# Patient Record
Sex: Female | Born: 1935 | Race: White | Hispanic: No | Smoking: Never smoker
Health system: Southern US, Community
[De-identification: ages and names within clinical notes are randomized; demographics above are authoritative.]

## PROBLEM LIST (undated history)

## (undated) DIAGNOSIS — G629 Polyneuropathy, unspecified: Secondary | ICD-10-CM

## (undated) DIAGNOSIS — R413 Other amnesia: Secondary | ICD-10-CM

## (undated) DIAGNOSIS — M5481 Occipital neuralgia: Secondary | ICD-10-CM

## (undated) DIAGNOSIS — R200 Anesthesia of skin: Secondary | ICD-10-CM

## (undated) DIAGNOSIS — E782 Mixed hyperlipidemia: Secondary | ICD-10-CM

## (undated) DIAGNOSIS — R1310 Dysphagia, unspecified: Secondary | ICD-10-CM

## (undated) DIAGNOSIS — C801 Malignant (primary) neoplasm, unspecified: Secondary | ICD-10-CM

## (undated) DIAGNOSIS — R278 Other lack of coordination: Secondary | ICD-10-CM

## (undated) DIAGNOSIS — N1831 Chronic kidney disease, stage 3a: Secondary | ICD-10-CM

## (undated) DIAGNOSIS — M1991 Primary osteoarthritis, unspecified site: Secondary | ICD-10-CM

## (undated) DIAGNOSIS — S42034A Nondisplaced fracture of lateral end of right clavicle, initial encounter for closed fracture: Secondary | ICD-10-CM

## (undated) DIAGNOSIS — I10 Essential (primary) hypertension: Secondary | ICD-10-CM

## (undated) DIAGNOSIS — G4730 Sleep apnea, unspecified: Secondary | ICD-10-CM

## (undated) DIAGNOSIS — M461 Sacroiliitis, not elsewhere classified: Secondary | ICD-10-CM

## (undated) DIAGNOSIS — I340 Nonrheumatic mitral (valve) insufficiency: Secondary | ICD-10-CM

## (undated) DIAGNOSIS — C441292 Squamous cell carcinoma of skin of left lower eyelid, including canthus: Secondary | ICD-10-CM

## (undated) DIAGNOSIS — M48061 Spinal stenosis, lumbar region without neurogenic claudication: Secondary | ICD-10-CM

## (undated) DIAGNOSIS — R7303 Prediabetes: Secondary | ICD-10-CM

## (undated) DIAGNOSIS — R519 Headache, unspecified: Secondary | ICD-10-CM

## (undated) DIAGNOSIS — R0602 Shortness of breath: Secondary | ICD-10-CM

## (undated) DIAGNOSIS — R569 Unspecified convulsions: Secondary | ICD-10-CM

## (undated) DIAGNOSIS — E079 Disorder of thyroid, unspecified: Secondary | ICD-10-CM

## (undated) DIAGNOSIS — I071 Rheumatic tricuspid insufficiency: Secondary | ICD-10-CM

## (undated) DIAGNOSIS — J309 Allergic rhinitis, unspecified: Secondary | ICD-10-CM

## (undated) DIAGNOSIS — M5126 Other intervertebral disc displacement, lumbar region: Secondary | ICD-10-CM

## (undated) HISTORY — PX: ABDOMINAL HYSTERECTOMY: SHX81

## (undated) HISTORY — PX: KNEE ARTHROSCOPY W/ LATERAL RETINACULAR REPAIR: SHX1875

## (undated) HISTORY — DX: Disorder of thyroid, unspecified: E07.9

## (undated) HISTORY — PX: BREAST BIOPSY: SHX20

## (undated) HISTORY — DX: Unspecified convulsions: R56.9

## (undated) HISTORY — PX: BREAST EXCISIONAL BIOPSY: SUR124

---
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
## 2011-05-14 DIAGNOSIS — M8580 Other specified disorders of bone density and structure, unspecified site: Secondary | ICD-10-CM | POA: Insufficient documentation

## 2011-06-24 DIAGNOSIS — E039 Hypothyroidism, unspecified: Secondary | ICD-10-CM | POA: Insufficient documentation

## 2012-01-06 ENCOUNTER — Ambulatory Visit: Payer: Self-pay | Admitting: Radiology

## 2012-01-06 LAB — CREATININE, SERUM: EGFR (African American): >60

## 2012-01-07 ENCOUNTER — Ambulatory Visit: Payer: Self-pay | Admitting: Internal Medicine

## 2012-02-13 ENCOUNTER — Ambulatory Visit: Payer: Self-pay | Admitting: Internal Medicine

## 2012-03-04 ENCOUNTER — Ambulatory Visit: Payer: Self-pay

## 2012-04-12 DIAGNOSIS — K589 Irritable bowel syndrome without diarrhea: Secondary | ICD-10-CM | POA: Insufficient documentation

## 2012-12-07 DIAGNOSIS — M47816 Spondylosis without myelopathy or radiculopathy, lumbar region: Secondary | ICD-10-CM | POA: Insufficient documentation

## 2012-12-08 ENCOUNTER — Ambulatory Visit: Payer: Self-pay | Admitting: Family Medicine

## 2013-02-12 ENCOUNTER — Emergency Department: Payer: Self-pay | Admitting: Emergency Medicine

## 2013-02-12 LAB — TROPONIN I: Troponin-I: <0.02 ng/mL

## 2013-02-12 LAB — URINALYSIS, COMPLETE
Glucose,UR: NEGATIVE mg/dL (ref 0–75)
Squamous Epithelial: 1

## 2013-02-12 LAB — COMPREHENSIVE METABOLIC PANEL
Anion Gap: 11 (ref 7–16)
Calcium, Total: 9.1 mg/dL (ref 8.5–10.1)
Chloride: 106 mmol/L (ref 98–107)
Co2: 21 mmol/L (ref 21–32)
Creatinine: 0.84 mg/dL (ref 0.60–1.30)
EGFR (African American): >60
EGFR (Non-African Amer.): >60
Glucose: 150 mg/dL — ABNORMAL HIGH (ref 65–99)
SGOT(AST): 28 U/L (ref 15–37)
SGPT (ALT): 28 U/L (ref 12–78)
Sodium: 138 mmol/L (ref 136–145)
Total Protein: 7.1 g/dL (ref 6.4–8.2)

## 2013-02-12 LAB — CK TOTAL AND CKMB (NOT AT ARMC)
CK, Total: 66 U/L (ref 21–215)
CK-MB: <0.5 ng/mL — ABNORMAL LOW (ref 0.5–3.6)

## 2013-02-12 LAB — CBC
HCT: 36.3 % (ref 35.0–47.0)
HGB: 12.5 g/dL (ref 12.0–16.0)
MCHC: 34.4 g/dL (ref 32.0–36.0)
Platelet: 158 10*3/uL (ref 150–440)
RDW: 13.3 % (ref 11.5–14.5)

## 2013-02-15 ENCOUNTER — Inpatient Hospital Stay: Payer: Self-pay | Admitting: Internal Medicine

## 2013-02-15 LAB — URINALYSIS, COMPLETE
Glucose,UR: NEGATIVE mg/dL (ref 0–75)
Ketone: NEGATIVE
Nitrite: NEGATIVE
Specific Gravity: 1.012 (ref 1.003–1.030)
Squamous Epithelial: <1

## 2013-02-15 LAB — CBC
HGB: 13.4 g/dL (ref 12.0–16.0)
MCH: 30.3 pg (ref 26.0–34.0)
MCHC: 35.2 g/dL (ref 32.0–36.0)
Platelet: 213 10*3/uL (ref 150–440)
RBC: 4.43 10*6/uL (ref 3.80–5.20)
RDW: 13.7 % (ref 11.5–14.5)
WBC: 6.4 10*3/uL (ref 3.6–11.0)

## 2013-02-15 LAB — TSH: Thyroid Stimulating Horm: 6.05 u[IU]/mL — ABNORMAL HIGH

## 2013-02-16 LAB — BASIC METABOLIC PANEL
BUN: 13 mg/dL (ref 7–18)
Chloride: 113 mmol/L — ABNORMAL HIGH (ref 98–107)
EGFR (African American): >60
EGFR (Non-African Amer.): 60 — ABNORMAL LOW
Glucose: 95 mg/dL (ref 65–99)
Potassium: 3.4 mmol/L — ABNORMAL LOW (ref 3.5–5.1)
Sodium: 146 mmol/L — ABNORMAL HIGH (ref 136–145)

## 2013-02-16 LAB — CBC WITH DIFFERENTIAL/PLATELET
Basophil #: 0 10*3/uL (ref 0.0–0.1)
Basophil %: 0.8 %
Eosinophil #: 0.2 10*3/uL (ref 0.0–0.7)
HGB: 11.1 g/dL — ABNORMAL LOW (ref 12.0–16.0)
Lymphocyte #: 1.5 10*3/uL (ref 1.0–3.6)
MCH: 30.5 pg (ref 26.0–34.0)
MCHC: 35.8 g/dL (ref 32.0–36.0)
Monocyte #: 0.4 x10 3/mm (ref 0.2–0.9)
Neutrophil #: 1.9 10*3/uL (ref 1.4–6.5)
Neutrophil %: 46.1 %
Platelet: 182 10*3/uL (ref 150–440)
RBC: 3.66 10*6/uL — ABNORMAL LOW (ref 3.80–5.20)
WBC: 4.1 10*3/uL (ref 3.6–11.0)

## 2013-02-16 LAB — COMPREHENSIVE METABOLIC PANEL
Albumin: 3.8 g/dL (ref 3.4–5.0)
BUN: 16 mg/dL (ref 7–18)
Bilirubin,Total: 0.9 mg/dL (ref 0.2–1.0)
Chloride: 108 mmol/L — ABNORMAL HIGH (ref 98–107)
EGFR (African American): 58 — ABNORMAL LOW
EGFR (Non-African Amer.): 50 — ABNORMAL LOW
Glucose: 114 mg/dL — ABNORMAL HIGH (ref 65–99)
Osmolality: 285 (ref 275–301)
Potassium: 3.4 mmol/L — ABNORMAL LOW (ref 3.5–5.1)
SGOT(AST): 25 U/L (ref 15–37)
SGPT (ALT): 34 U/L (ref 12–78)

## 2013-02-17 LAB — BASIC METABOLIC PANEL
BUN: 14 mg/dL (ref 7–18)
Calcium, Total: 8.9 mg/dL (ref 8.5–10.1)
Creatinine: 1.01 mg/dL (ref 0.60–1.30)
EGFR (Non-African Amer.): 53 — ABNORMAL LOW
Osmolality: 285 (ref 275–301)
Potassium: 4 mmol/L (ref 3.5–5.1)

## 2013-02-17 LAB — MAGNESIUM: Magnesium: 1.9 mg/dL

## 2013-02-18 LAB — CULTURE, BLOOD (SINGLE)

## 2013-02-20 LAB — CULTURE, BLOOD (SINGLE)

## 2013-04-15 DIAGNOSIS — IMO0002 Reserved for concepts with insufficient information to code with codable children: Secondary | ICD-10-CM | POA: Insufficient documentation

## 2013-04-15 DIAGNOSIS — R1310 Dysphagia, unspecified: Secondary | ICD-10-CM | POA: Insufficient documentation

## 2013-04-15 DIAGNOSIS — E041 Nontoxic single thyroid nodule: Secondary | ICD-10-CM | POA: Insufficient documentation

## 2013-08-03 ENCOUNTER — Ambulatory Visit: Payer: Self-pay | Admitting: Rheumatology

## 2013-09-06 DIAGNOSIS — M25551 Pain in right hip: Secondary | ICD-10-CM | POA: Insufficient documentation

## 2013-09-06 DIAGNOSIS — M25559 Pain in unspecified hip: Secondary | ICD-10-CM | POA: Insufficient documentation

## 2013-09-19 ENCOUNTER — Ambulatory Visit: Payer: Self-pay

## 2013-11-23 ENCOUNTER — Ambulatory Visit: Payer: Self-pay | Admitting: Family Medicine

## 2013-11-30 ENCOUNTER — Ambulatory Visit: Payer: Self-pay | Admitting: Family Medicine

## 2013-12-13 ENCOUNTER — Ambulatory Visit: Payer: Self-pay | Admitting: Family Medicine

## 2013-12-28 ENCOUNTER — Ambulatory Visit: Payer: Self-pay | Admitting: Family Medicine

## 2013-12-28 LAB — CREATININE, SERUM
Creatinine: 0.84 mg/dL (ref 0.60–1.30)
EGFR (Non-African Amer.): >60

## 2014-05-15 DIAGNOSIS — J309 Allergic rhinitis, unspecified: Secondary | ICD-10-CM | POA: Insufficient documentation

## 2014-10-23 DIAGNOSIS — E559 Vitamin D deficiency, unspecified: Secondary | ICD-10-CM | POA: Insufficient documentation

## 2014-12-19 ENCOUNTER — Ambulatory Visit: Payer: Self-pay | Admitting: Family Medicine

## 2015-01-12 NOTE — Discharge Summary (Signed)
PATIENT NAME:  Sarah [name], Sarah [name] MR#:  [id number] DATE OF BIRTH:  [birth year]  DATE OF ADMISSION:  02/15/2013 DATE OF DISCHARGE:  02/17/2013  PRIMARY CARE PHYSICIAN:  Dr. Hortencia Pilar.   DISCHARGE DIAGNOSES: 1.  Escherichia coli sepsis.  2.  Escherichia coli urinary tract infection.  3.  Hypothyroidism.  4.  Depression.  5.  Irritable bowel syndrome.   6.  Osteoarthritis.   DISCHARGE HOME MEDICATIONS:  1.  Diclofenac 75 mg by mouth twice daily.  2.  Synthroid 125 mcg by mouth daily.  3.  Sertraline 50 mg by mouth daily.  4.  Zyrtec 10 mg by mouth daily.  5.  Ocuvite multivitamin 1 tablet by mouth daily.  6.  Ipratropium nasal spray two sprays 3 times a day as needed.  7.  Probiotic capsule 1 capsule daily.  8.  Aspirin 81 mg by mouth daily.  9.  Gabapentin 100 mg by mouth at bedtime.  10.  Hyoscyamine 0.375 mg oral tablet twice daily.  11.  Ciprofloxacin 500 mg by mouth twice daily for 10 more days.   DISCHARGE DIET:  Low-sodium diet.   DISCHARGE ACTIVITY:  As tolerated.    FOLLOW-UP INSTRUCTIONS:  Follow up in 1 to 2 weeks with the PCP.   LABORATORY DATA AND IMAGING STUDIES:   1.  WBC 4.1, hemoglobin 11.1, hematocrit 31.1, platelet count 182.  Sodium 143, potassium 4.0, chloride 112, bicarbonate 25, BUN 14, creatinine 1.01, glucose 98 and calcium of 8.9, magnesium is 1.9.  2.  Blood cultures from May 27th are negative.  3.  Urinalysis negative, but blood cultures from May 25th are growing pansensitive Escherichia coli.  Urine cultures on 02/12/2013 growing pansensitive Escherichia coli as well.  Initial count on presentation to the ER was 9.2.   BRIEF HOSPITAL COURSE:  Sarah [name] is a pleasant 81 year old Caucasian female with past medical history significant for arthritis, irritable bowel syndrome, depression, hypothyroidism, presented to the ER for intractable nausea, vomiting and generalized weakness.  She was noticed to have a UTI and urine and blood cultures were sent and  patient was discharged home on Cipro and Doxycycline, however she had to come back because of intractable nausea, vomiting, unable to keep by mouth medications. 1.  Escherichia coli sepsis.  Blood cultures on initial ER visit were positive for Escherichia coli, likely source was urine because urine cultures was also growing the same, but inability to keep by mouth medications, intractable nausea, vomiting and generalized weakness.  She was admitted, started on IV Rocephin and white count improved and her nausea, vomiting improved so she is being discharged on by mouth ciprofloxacin for 10 more days to finish a two-week course of antibiotics.   2.  All her other home medications were continued without any changes.  Her course has been otherwise uneventful in the hospital.   DISCHARGE CONDITION:  Stable.   DISCHARGE DISPOSITION:  Home.   TIME SPENT ON DISCHARGE:  45 minutes.    ____________________________ Gladstone Lighter, MD rk:ea D: 02/17/2013 17:17:14 ET T: 02/18/2013 05:00:09 ET JOB#: [id number]  cc: Gladstone Lighter, MD, <Dictator> Gladstone Lighter MD ELECTRONICALLY SIGNED 02/28/2013 15:30

## 2015-01-12 NOTE — H&P (Signed)
PATIENT NAME:  Sarah [name], Sarah [name] MR#:  [id number] DATE OF BIRTH:  June 08, 1932  DATE OF ADMISSION:  02/15/2013  ADMITTING PHYSICIAN:  Gladstone Lighter, MD  PRIMARY CARE PHYSICIAN: Dr. Hoy Morn from [facility] in [place].  CHIEF COMPLAINT: Intractable nausea, vomiting and weakness.   HISTORY OF PRESENT ILLNESS: Sarah [name] is a very pleasant 81 year old Caucasian female with past medical history significant for diverticulosis, hypothyroidism and osteoarthritis who presents to the hospital with intractable nausea, vomiting and also generalized weakness going on for 4 days now. The patient was in the Emergency Room 3 days ago, on 02/13/2013, with similar complaints. Also had fevers and chills at the time. She had a positive urinary tract infection. Urine and blood cultures were sent and she was discharged home with Cipro and also doxycycline.  The patient had intractable nausea and vomiting and was not able to keep any of her antibiotics down and continued to feel poorly at home. Her urine and blood cultures both are positive for pansensitive E. coli so she comes back with similar symptoms and is being admitted for bacteremia and unable to take any p.o. medications at this time. She denies any dysuria, frequency, incontinence or any hematuria. Her only complaint was fever of 102 degrees Fahrenheit at home, generalized weakness associated with nausea and vomiting.   PAST MEDICAL HISTORY: 1.  Irritable bowel syndrome.  2.  Hypothyroidism.  3.  Diverticulosis.  4.  Osteoarthritis.   PAST SURGICAL HISTORY:  1.  Right knee replacement surgery. 2.  Left knee replacement surgery. 3.  Sciatica.  4.  Left eye detached retinal surgery. 5.  Total hysterectomy.   ALLERGIES TO MEDICATIONS: SHE IS INTOLERANT TO OXYCODONE, IBUPROFEN, CODEINE, PENICILLIN AND TYLENOL.  CURRENT HOME MEDICATIONS:  1.  Aspirin 81 mg p.o. daily.  2.  Diclofenac 75 mg p.o. b.i.d.  3.  Gabapentin 100 mg p.o. at bedtime.  4.   Hyoscyamine 0.375 mg tablet twice a day.  5.  Tiotropium nasal spray 2 sprays both nostrils 3 times a day.  6.  Ocuvite multivitamin 1 tablet every day.  7.  Probiotic capsule 1 capsule every day.  8.  Sertraline 15 mg p.o. daily.  9.  Synthroid 125 mcg p.o. daily.  10.  Zyrtec 10 mg p.o. daily.  11.  She was started on Cipro 500 mg p.o. b.i.d. 2 to 3 days ago.  12.  Doxycycline 100 mg p.o. b.i.d. was also started 3 days ago, but unable to keep both these antibiotics down.   SOCIAL HISTORY: Lives at home by herself. Son lives about 15 minutes away.  No smoking or alcohol use. She has been pretty functional and active at baseline, other than her arthritis, but has not been able to move much over the last 3 days since the infection.   FAMILY HISTORY: Cancer runs big time in the family.  Mom died from breast cancer and dad had laryngeal cancer, although he was a smoker.  REVIEW OF SYSTEMS: CONSTITUTIONAL: Positive for fever, fatigue and weakness.  EYES: No blurred vision, double vision, inflammation or glaucoma. History of cataract surgery.  ENT: No tinnitus, ear pain, hearing loss, epistaxis or discharge.  RESPIRATORY: No cough, wheeze, hemoptysis or COPD. CARDIOVASCULAR: No chest pain, orthopnea, edema, arrhythmia, palpitations or syncope.  GASTROINTESTINAL: Positive for nausea and vomiting and mild discomfort in the lower abdomen. No constipation. Had diarrhea yesterday. Positive for IBS.  No rectal bleed or hematemesis or melena.  GENITOURINARY: No dysuria, hematuria, renal calculus, frequency or  incontinence.  ENDOCRINE: No polyuria, nocturia, thyroid problems, heat or cold intolerance.  HEMATOLOGY: No anemia, easy bruising or bleeding.  SKIN: No acne, rash or lesions.  MUSCULOSKELETAL: Positive for severe arthritis. No gout.  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 No CVA, TIA or seizures.   PSYCHIATRIC:  No anxiety,  insomnia or depression.   PHYSICAL EXAMINATION: VITAL SIGNS: Temperature 98.3 degrees Fahrenheit, pulse 70, respirations 18, blood pressure 123/63, pulse ox 96% on room air.  GENERAL: Well-built, well-nourished female lying in bed, not in any acute distress.  HEENT: Normocephalic, atraumatic. Pupils are postsurgical, equal, round and reacting to light. Anicteric sclerae. Extraocular movements intact. Nasopharynx is clear without any lesions or discharge. Both ears, external ears, appeared clean without any lesions or discharge. Oropharynx is clear without erythema, mass or exudates.  NECK: Is supple. No thyromegaly, JVD or carotid bruits. No lymphadenopathy. Trachea is midline.  LUNGS: Clear to auscultation bilaterally. No wheeze or crackles. No use of accessory muscles for breathing.  HEART:  S1 and S2 regular rate and rhythm. No murmurs, rubs or gallops. No pedal edema.  Normal superficial external pulses palpable. ABDOMEN: Soft, nontender, nondistended. No hepatosplenomegaly. Normal bowel sounds.  EXTREMITIES: No pedal edema. No clubbing or cyanosis. 2+ dorsalis pedis pulses palpable bilaterally.  SKIN: No acne, rash or lesions.  LYMPHATICS: No cervical lymphadenopathy.  MUSCULOSKELETAL: Normal gait. Mild tenderness noted in all the major joints; however, no erythema or swelling seen from her arthritis at this time. NEUROLOGIC: Cranial nerves II through XII remain intact. Deep tendon reflexes are 2+ in biceps and 1+ both knee jerks with decreased sensation to touch distal to the ankles on both feet. Motor strength is 5/5 in all 4 extremities.  Normal cerebellar function tests.  PSYCHOLOGIC:  The patient is awake, alert and oriented x 3.  No anxiety, homicidal or suicidal ideation.  LABORATORY AND DIAGNOSTICS: WBC is 6.4, hemoglobin 13.4, hematocrit 38.2 and platelet count is 213. Sodium 142, potassium 3.4, chloride 108, bicarb 28, BUN 16, creatinine 1.07, glucose 114 and calcium of 10.0.  ALT  34, AST 25, alk phos 102, total bilirubin 0.9 and albumin of 3.8. Urinalysis from today is negative for any infection. Urinalysis 2 days ago with 3+ leuk esterase, positive with some bacteria and also WBCs.  One set of blood cultures, from 02/12/2013, growing greater than 100,000 colonies of E. coli and also urine cultures from the same day growing greater than 100,000 colonies of E. coli.    Lyme antibody and [facility] Spotted Fever antibody are negative.    ASSESSMENT AND PLAN: A 81 year old female with past medical history of hypothyroidism and arthritis who was in the ED 3 days ago for urinary tract infection who had positive blood and urine cultures with pan sensitive Escherichia coli who was sent home on Cipro and doxycycline, but due to intractable nausea and vomiting she was not able to keep any p.o. medications down  so comes back to the ED.  1.  Escherichia coli bacteremia, pan sensitive, with positive urine cultures growing the same organism so likely that is the source at this time. She is unable to keep any p.o. medications down so will admit her, start IV fluids and continue IV Rocephin. Once her symptoms improve, she can be discharged on p.o. antibiotics. Repeat blood cultures are drawn to make sure it has cleared up at this time.   2.  Hypothyroidism.  Check TSH and continue her Synthroid.  3.  Arthritis. Continue her home medications. 4.  Gastrointestinal and deep venous thrombosis prophylaxis. On Protonix and Lovenox.  5.  Code status of the patient, FULL CODE.   TIME SPENT:  On admission is 50 minutes.  ____________________________ Gladstone Lighter, MD rk:sb D: 02/15/2013 14:32:36 ET T: 02/15/2013 14:55:01 ET JOB#: [id number]  cc: Gladstone Lighter, MD, <Dictator> Kerin Perna, MD Gladstone Lighter MD ELECTRONICALLY SIGNED 02/28/2013 15:30

## 2015-01-31 ENCOUNTER — Ambulatory Visit
Admission: RE | Admit: 2015-01-31 | Discharge: 2015-01-31 | Disposition: A | Discharge status: Home / Self Care | Payer: Medicare Other | Source: Ambulatory Visit | Attending: Family Medicine | Admitting: Family Medicine

## 2015-01-31 ENCOUNTER — Other Ambulatory Visit: Payer: Self-pay | Admitting: Family Medicine

## 2015-01-31 DIAGNOSIS — R1032 Left lower quadrant pain: Secondary | ICD-10-CM | POA: Diagnosis present

## 2015-01-31 DIAGNOSIS — K5790 Diverticulosis of intestine, part unspecified, without perforation or abscess without bleeding: Secondary | ICD-10-CM | POA: Diagnosis not present

## 2015-01-31 DIAGNOSIS — K5900 Constipation, unspecified: Secondary | ICD-10-CM | POA: Insufficient documentation

## 2015-01-31 HISTORY — DX: Malignant (primary) neoplasm, unspecified: C80.1

## 2015-01-31 MED ORDER — IOHEXOL 300 MG/ML  SOLN
100.0000 mL | Freq: Once | INTRAMUSCULAR | Status: AC | PRN
  Administered 2015-01-31: 100 mL via INTRAVENOUS

## 2015-02-01 ENCOUNTER — Other Ambulatory Visit: Payer: Self-pay | Admitting: Family Medicine

## 2015-02-01 DIAGNOSIS — R1032 Left lower quadrant pain: Secondary | ICD-10-CM

## 2015-04-12 DIAGNOSIS — R200 Anesthesia of skin: Secondary | ICD-10-CM | POA: Insufficient documentation

## 2015-09-14 ENCOUNTER — Emergency Department
Admission: EM | Admit: 2015-09-14 | Discharge: 2015-09-14 | Disposition: A | Discharge status: Home / Self Care | Payer: Medicare Other | Attending: Emergency Medicine | Admitting: Emergency Medicine

## 2015-09-14 ENCOUNTER — Emergency Department: Payer: Medicare Other

## 2015-09-14 DIAGNOSIS — Z7982 Long term (current) use of aspirin: Secondary | ICD-10-CM | POA: Diagnosis not present

## 2015-09-14 DIAGNOSIS — Z880 Allergy status to penicillin: Secondary | ICD-10-CM | POA: Diagnosis not present

## 2015-09-14 DIAGNOSIS — Z79899 Other long term (current) drug therapy: Secondary | ICD-10-CM | POA: Diagnosis not present

## 2015-09-14 DIAGNOSIS — R0789 Other chest pain: Secondary | ICD-10-CM | POA: Insufficient documentation

## 2015-09-14 DIAGNOSIS — R079 Chest pain, unspecified: Secondary | ICD-10-CM | POA: Diagnosis present

## 2015-09-14 LAB — COMPREHENSIVE METABOLIC PANEL
ALK PHOS: 54 U/L (ref 38–126)
ALT: 21 U/L (ref 14–54)
ANION GAP: 6 (ref 5–15)
AST: 19 U/L (ref 15–41)
Albumin: 4.5 g/dL (ref 3.5–5.0)
BUN: 23 mg/dL — ABNORMAL HIGH (ref 6–20)
CO2: 28 mmol/L (ref 22–32)
Calcium: 9.7 mg/dL (ref 8.9–10.3)
Chloride: 107 mmol/L (ref 101–111)
Creatinine, Ser: 0.8 mg/dL (ref 0.44–1.00)
Glucose, Bld: 109 mg/dL — ABNORMAL HIGH (ref 65–99)
POTASSIUM: 3.5 mmol/L (ref 3.5–5.1)
Sodium: 141 mmol/L (ref 135–145)
TOTAL PROTEIN: 6.6 g/dL (ref 6.5–8.1)
Total Bilirubin: 1.3 mg/dL — ABNORMAL HIGH (ref 0.3–1.2)

## 2015-09-14 LAB — CBC
HEMATOCRIT: 38.3 % (ref 35.0–47.0)
HEMOGLOBIN: 13.2 g/dL (ref 12.0–16.0)
MCH: 30.4 pg (ref 26.0–34.0)
MCHC: 34.5 g/dL (ref 32.0–36.0)
MCV: 88.1 fL (ref 80.0–100.0)
Platelets: 177 10*3/uL (ref 150–440)
RBC: 4.34 MIL/uL (ref 3.80–5.20)
RDW: 13.8 % (ref 11.5–14.5)
WBC: 5.1 10*3/uL (ref 3.6–11.0)

## 2015-09-14 LAB — TROPONIN I
Troponin I: <0.03 ng/mL (ref <0.031)
Troponin I: <0.03 ng/mL (ref <0.031)

## 2015-09-14 LAB — APTT: aPTT: 31 seconds (ref 24–36)

## 2015-09-14 LAB — PROTIME-INR
INR: 1.08
PROTHROMBIN TIME: 14.2 s (ref 11.4–15.0)

## 2015-09-14 LAB — MAGNESIUM: MAGNESIUM: 2.2 mg/dL (ref 1.7–2.4)

## 2015-09-14 MED ORDER — ASPIRIN 81 MG PO CHEW: 324.0000 mg | CHEWABLE_TABLET | Freq: Once | ORAL | Status: DC

## 2015-09-14 NOTE — Discharge Instructions (Signed)

## 2015-09-14 NOTE — ED Notes (Addendum)
CP to left side that began PTA. Pt states that she leaned over near the toilet and had sharp and shooting pain to left chest, near collarbone 10/10. States that she sat there for approx 5 minutes and then got up and called her doctor's office. Nurse at [place] office called EMS for patient to be evaluated.  Describes as aching pain at this time 4/10. Pt had 1 spray of nitro and 324 ASA with EMS. VSS. CBG 100. Pt alert and oriented X4, active, cooperative, pt in NAD. RR even and unlabored, color WNL.  Denies fall, denies LOC.

## 2015-09-14 NOTE — ED Provider Notes (Signed)
[facility] Emergency Department Provider Note  ____________________________________________  Time seen: Approximately 5:08 PM  I have reviewed the triage vital signs and the nursing notes.   HISTORY  Chief Complaint Chest Pain    HPI Sarah [name] is a 81 y.o. female who is generally healthy for her age and not on any chronic medications other than a baby aspirin and who has not had any prior cardiac history of which she is aware presents by EMS for acute onset left-sided chest pain.  She was not exerting herself but she bent over near her toilet and had a sharp and shooting pain to the left chest and radiating up into her neck.  After waiting for the past for about 5 minutes she called her doctor's office and they called EMS to transport her.  She describes a aching pain at this point.  She did receive a full dose of aspirin and a spray of nitroglycerin prior to arrival which did not appreciably alter her symptoms.  She is well appearing, alert and oriented, and in no acute distress with no difficulty breathing.He has never had similar symptoms in the past.  She has a cardiologist and a primary care doctor but states she has never had any heart problems of which she is aware.  She denies nausea/vomiting, fever/chills, dysuria.   Past Medical History  Diagnosis Date  . Cancer ([place])     Skin CA resected from Right eyelid and both legs.    There are no active problems to display for this patient.   History reviewed. No pertinent past surgical history.  Current Outpatient Rx  Name  Route  Sig  Dispense  Refill  . acidophilus (RISAQUAD) CAPS capsule   Oral   Take 1 capsule by mouth daily.         Marland Kitchen aspirin EC 81 MG tablet   Oral   Take 1 tablet by mouth daily.         . beta carotene w/minerals (OCUVITE) tablet   Oral   Take 1 tablet by mouth daily.         Marland Kitchen denosumab (PROLIA) 60 MG/ML SOLN injection   Subcutaneous   Inject 60 mg  into the skin every 30 (thirty) days. Administer in upper arm, thigh, or abdomen         . gabapentin (NEURONTIN) 100 MG capsule   Oral   Take 1 capsule by mouth at bedtime.         . hydrOXYzine (ATARAX/VISTARIL) 10 MG tablet   Oral   Take 1 tablet by mouth as needed.         Marland Kitchen LUMIGAN 0.01 % SOLN   Left Eye   Place 1 drop into the left eye at bedtime.           Dispense as written.   . RESTASIS 0.05 % ophthalmic emulsion   Ophthalmic   Apply 1 drop to eye 2 (two) times daily.           Dispense as written.   . sertraline (ZOLOFT) 50 MG tablet   Oral   Take 1 tablet by mouth daily.         . simvastatin (ZOCOR) 10 MG tablet   Oral   Take 10 mg by mouth at bedtime.         Marland Kitchen SYNTHROID 125 MCG tablet   Oral   Take 1 tablet by mouth daily.  Dispense as written.     Allergies Codeine; Hydrocodone; Peanuts; Penicillins; Tramadol; and Vitamin d analogs  No family history on file.  Social History Social History  Substance Use Topics  . Smoking status: Never Smoker   . Smokeless tobacco: None  . Alcohol Use: No    Review of Systems Constitutional: No fever/chills Eyes: No visual changes. ENT: No sore throat. Cardiovascular: Acute onset left-sided severe chest pain, now mild Respiratory: Denies shortness of breath. Gastrointestinal: No abdominal pain.  No nausea, no vomiting.  No diarrhea.  No constipation. Genitourinary: Negative for dysuria. Musculoskeletal: Negative for back pain. Skin: Negative for rash. Neurological: Negative for headaches, focal weakness or numbness.  10-point ROS otherwise negative.  ____________________________________________   PHYSICAL EXAM:  VITAL SIGNS: ED Triage Vitals  Enc Vitals Group     BP 09/14/15 1602 149/92 mmHg     Pulse Rate 09/14/15 1557 73     Resp 09/14/15 1557 20     Temp 09/14/15 1557 98.2 F (36.8 C)     Temp Source 09/14/15 1557 Oral     SpO2 --      Weight 09/14/15 1557 164  lb (74.39 kg)     Height 09/14/15 1557 5\' 8"  (1.727 m)     Head Cir --      Peak Flow --      Pain Score 09/14/15 1600 5     Pain Loc --      Pain Edu? --      Excl. in [place]? --     Constitutional: Alert and oriented. Well appearing and in no acute distress. Eyes: Conjunctivae are normal. PERRL. EOMI. Head: Atraumatic. Nose: No congestion/rhinnorhea. Mouth/Throat: Mucous membranes are moist.  Oropharynx non-erythematous. Neck: No stridor.   Cardiovascular: Normal rate, regular rhythm. Grossly normal heart sounds.  Good peripheral circulation. Respiratory: Normal respiratory effort.  No retractions. Lungs CTAB. Gastrointestinal: Soft and nontender. No distention. No abdominal bruits. No CVA tenderness. Musculoskeletal: No lower extremity tenderness nor edema.  No joint effusions. Neurologic:  Normal speech and language. No gross focal neurologic deficits are appreciated.  Skin:  Skin is warm, dry and intact. No rash noted. Psychiatric: Mood and affect are normal. Speech and behavior are normal.  ____________________________________________   LABS (all labs ordered are listed, but only abnormal results are displayed)  Labs Reviewed  COMPREHENSIVE METABOLIC PANEL - Abnormal; Notable for the following:    Glucose, Bld 109 (*)    BUN 23 (*)    Total Bilirubin 1.3 (*)    All other components within normal limits  APTT  CBC  MAGNESIUM  PROTIME-INR  TROPONIN I  TROPONIN I   ____________________________________________  EKG  ED ECG REPORT I, Heatherly Stenner, the attending physician, personally viewed and interpreted this ECG.   Date: 09/14/2015  EKG Time: 15:59  Rate: 75  Rhythm: normal sinus rhythm  Axis: LAD  Intervals:Normal  ST&T Change: Non-specific ST segment / T-wave changes, but no evidence of acute ischemia.   ____________________________________________  RADIOLOGY   Dg Chest 2 View  09/14/2015  CLINICAL DATA:  Left-sided chest pain today. No known injury.  Initial encounter. EXAM: CHEST  2 VIEW COMPARISON:  Radiographs 02/13/2012. FINDINGS: The heart size and mediastinal contours are normal. The lungs are clear. There is no pleural effusion or pneumothorax. No acute osseous findings are identified. Thoracic spine paraspinal osteophytes are grossly unchanged. IMPRESSION: Stable chest.  No active cardiopulmonary process. Electronically Signed   By: Richardean Sale M.D.   On:  09/14/2015 18:02    ____________________________________________   PROCEDURES  Procedure(s) performed: None  Critical Care performed: No ____________________________________________   INITIAL IMPRESSION / ASSESSMENT AND PLAN / ED COURSE  Pertinent labs & imaging results that were available during my care of the patient were reviewed by me and considered in my medical decision making (see chart for details).  The patient received a full dose aspirin and her pain has dramatically improved.  Her symptoms are very atypical and I do not believe they represent ACS.  I had my usual and customary low risk chest pain discussion with the patient and her family member.  I offered admission but explained I do not think this is necessary at this time and I suggested we obtain a second troponin will continue to observe her for another 3 hours.  She understands and agrees with this plan and states that she would rather go home today if at all possible.  Given the fact that the pain started when she was bending over and I suspect this is likely musculoskeletal rather than representative of ischemic chest pain.  I am also obtaining a chest x-ray.  ----------------------------------------- 8:10 PM on 09/14/2015 -----------------------------------------  Patient remains asymptomatic the additional episodes of chest pain while in the emergency department.  She very much wants to go home.  Her HEART score is at most 3 (low risk).    I gave my usual and customary return precautions, and the  patient will follow up with Dr. Nehemiah Massed at the next available opportunity.  Patient family all understand and agree with this plan.  ____________________________________________  FINAL CLINICAL IMPRESSION(S) / ED DIAGNOSES  Final diagnoses:  Atypical chest pain      NEW MEDICATIONS STARTED DURING THIS VISIT:  New Prescriptions   No medications on file     Hinda Kehr, MD 09/14/15 2015

## 2015-09-21 DIAGNOSIS — I10 Essential (primary) hypertension: Secondary | ICD-10-CM | POA: Insufficient documentation

## 2015-09-21 DIAGNOSIS — I071 Rheumatic tricuspid insufficiency: Secondary | ICD-10-CM | POA: Insufficient documentation

## 2015-09-21 HISTORY — DX: Essential (primary) hypertension: I10

## 2015-10-18 ENCOUNTER — Other Ambulatory Visit
Admission: RE | Admit: 2015-10-18 | Discharge: 2015-10-18 | Disposition: A | Discharge status: Home / Self Care | Payer: Medicare Other | Source: Ambulatory Visit | Attending: Dermatology | Admitting: Dermatology

## 2015-10-18 DIAGNOSIS — B351 Tinea unguium: Secondary | ICD-10-CM | POA: Insufficient documentation

## 2015-10-18 DIAGNOSIS — L570 Actinic keratosis: Secondary | ICD-10-CM | POA: Insufficient documentation

## 2015-10-18 LAB — HEPATIC FUNCTION PANEL
ALBUMIN: 4.5 g/dL (ref 3.5–5.0)
ALK PHOS: 65 U/L (ref 38–126)
ALT: 18 U/L (ref 14–54)
AST: 19 U/L (ref 15–41)
BILIRUBIN TOTAL: 1.1 mg/dL (ref 0.3–1.2)
Bilirubin, Direct: 0.1 mg/dL (ref 0.1–0.5)
Indirect Bilirubin: 1 mg/dL — ABNORMAL HIGH (ref 0.3–0.9)
Total Protein: 7.2 g/dL (ref 6.5–8.1)

## 2015-11-26 ENCOUNTER — Other Ambulatory Visit
Admission: RE | Admit: 2015-11-26 | Discharge: 2015-11-26 | Disposition: A | Discharge status: Home / Self Care | Payer: Medicare Other | Source: Ambulatory Visit | Attending: Dermatology | Admitting: Dermatology

## 2015-11-26 DIAGNOSIS — B351 Tinea unguium: Secondary | ICD-10-CM | POA: Diagnosis present

## 2015-11-26 DIAGNOSIS — L570 Actinic keratosis: Secondary | ICD-10-CM | POA: Diagnosis present

## 2015-11-26 LAB — HEPATIC FUNCTION PANEL
ALBUMIN: 4.7 g/dL (ref 3.5–5.0)
ALT: 15 U/L (ref 14–54)
AST: 19 U/L (ref 15–41)
Alkaline Phosphatase: 64 U/L (ref 38–126)
Bilirubin, Direct: 0.1 mg/dL (ref 0.1–0.5)
Indirect Bilirubin: 0.9 mg/dL (ref 0.3–0.9)
TOTAL PROTEIN: 7.1 g/dL (ref 6.5–8.1)
Total Bilirubin: 1 mg/dL (ref 0.3–1.2)

## 2015-12-03 ENCOUNTER — Other Ambulatory Visit: Payer: Self-pay | Admitting: Family Medicine

## 2015-12-03 DIAGNOSIS — Z1231 Encounter for screening mammogram for malignant neoplasm of breast: Secondary | ICD-10-CM

## 2015-12-24 ENCOUNTER — Ambulatory Visit
Admission: RE | Admit: 2015-12-24 | Discharge: 2015-12-24 | Disposition: A | Discharge status: Home / Self Care | Payer: Medicare Other | Source: Ambulatory Visit | Attending: Family Medicine | Admitting: Family Medicine

## 2015-12-24 DIAGNOSIS — Z1231 Encounter for screening mammogram for malignant neoplasm of breast: Secondary | ICD-10-CM | POA: Insufficient documentation

## 2016-01-24 DIAGNOSIS — Z9181 History of falling: Secondary | ICD-10-CM | POA: Insufficient documentation

## 2016-01-24 DIAGNOSIS — R278 Other lack of coordination: Secondary | ICD-10-CM | POA: Insufficient documentation

## 2016-02-05 DIAGNOSIS — E782 Mixed hyperlipidemia: Secondary | ICD-10-CM | POA: Insufficient documentation

## 2016-04-24 ENCOUNTER — Other Ambulatory Visit: Payer: Self-pay | Admitting: Neurology

## 2016-04-24 DIAGNOSIS — M79605 Pain in left leg: Secondary | ICD-10-CM | POA: Insufficient documentation

## 2016-04-29 ENCOUNTER — Ambulatory Visit
Admission: RE | Admit: 2016-04-29 | Discharge: 2016-04-29 | Disposition: A | Discharge status: Home / Self Care | Payer: Medicare Other | Source: Ambulatory Visit | Attending: Neurology | Admitting: Neurology

## 2016-04-29 DIAGNOSIS — M79605 Pain in left leg: Secondary | ICD-10-CM | POA: Diagnosis present

## 2016-12-29 ENCOUNTER — Other Ambulatory Visit: Payer: Self-pay | Admitting: Family Medicine

## 2016-12-29 DIAGNOSIS — Z1231 Encounter for screening mammogram for malignant neoplasm of breast: Secondary | ICD-10-CM

## 2016-12-31 ENCOUNTER — Ambulatory Visit
Admission: RE | Admit: 2016-12-31 | Discharge: 2016-12-31 | Disposition: A | Discharge status: Home / Self Care | Payer: Medicare Other | Source: Ambulatory Visit | Attending: Family Medicine | Admitting: Family Medicine

## 2016-12-31 ENCOUNTER — Other Ambulatory Visit: Payer: Self-pay | Admitting: Family Medicine

## 2016-12-31 DIAGNOSIS — Z1231 Encounter for screening mammogram for malignant neoplasm of breast: Secondary | ICD-10-CM

## 2017-04-17 ENCOUNTER — Ambulatory Visit (INDEPENDENT_AMBULATORY_CARE_PROVIDER_SITE_OTHER): Payer: Medicare Other

## 2017-04-17 ENCOUNTER — Ambulatory Visit
Admission: EM | Admit: 2017-04-17 | Discharge: 2017-04-17 | Disposition: A | Discharge status: Home / Self Care | Payer: Medicare Other | Attending: Family | Admitting: Family

## 2017-04-17 DIAGNOSIS — M79674 Pain in right toe(s): Secondary | ICD-10-CM | POA: Diagnosis not present

## 2017-04-17 DIAGNOSIS — W2209XA Striking against other stationary object, initial encounter: Secondary | ICD-10-CM | POA: Diagnosis not present

## 2017-04-17 DIAGNOSIS — S92501A Displaced unspecified fracture of right lesser toe(s), initial encounter for closed fracture: Secondary | ICD-10-CM | POA: Diagnosis not present

## 2017-04-17 MED ORDER — IBUPROFEN 400 MG PO TABS
400.0000 mg | ORAL_TABLET | Freq: Once | ORAL | Status: AC
  Administered 2017-04-17: 400 mg via ORAL

## 2017-04-17 NOTE — Discharge Instructions (Addendum)
Rest,ice,elevate right great toe. Your toe is broken. Buddy tape for support. May take tylenol 650 mg every 6 hours for pain and or ibuprofen 400 mg every 8 hours x 3 days for pain/swelling right toe. Follow up with PCP/[place], podiatrist regarding further management of toe fracture.

## 2017-04-17 NOTE — ED Triage Notes (Signed)
83 year old Caucasian female is here today with complaints of right 5th toe pain due to hitting it on her stationery bike about 30 minutes ago.

## 2017-04-17 NOTE — ED Provider Notes (Signed)
CSN: [id number]     Arrival date & time 04/17/17  1054 History   None    Chief Complaint  Patient presents with  . Toe Pain   (Consider location/radiation/quality/duration/timing/severity/associated sxs/prior Treatment) The history is provided by the patient. No language interpreter was used.  Toe Pain  This is a new problem. The current episode started less than 1 hour ago. The problem occurs constantly. The problem has not changed since onset.Pertinent negatives include no chest pain, no abdominal pain, no headaches and no shortness of breath. The symptoms are aggravated by walking. The symptoms are relieved by rest. She has tried nothing for the symptoms.    Past Medical History:  Diagnosis Date  . Cancer ([place])    Skin CA resected from Right eyelid and both legs.   Past Surgical History:  Procedure Laterality Date  . ABDOMINAL HYSTERECTOMY    . BREAST BIOPSY Bilateral    neg   Family History  Problem Relation Age of Onset  . Breast cancer Mother 62  . Breast cancer Maternal Uncle 60  . Breast cancer Maternal Aunt        mat great aunt  . Breast cancer Maternal Aunt 46   Social History  Substance Use Topics  . Smoking status: Never Smoker  . Smokeless tobacco: Not on file  . Alcohol use No   OB History    No data available     Review of Systems  Constitutional: Negative for fever.  Respiratory: Negative for shortness of breath.   Cardiovascular: Negative for chest pain.  Gastrointestinal: Negative for abdominal pain.  Musculoskeletal: Positive for arthralgias and gait problem.  Skin: Positive for color change.  Neurological: Negative for headaches.  All other systems reviewed and are negative.   Allergies  Codeine; Hydrocodone; Peanuts [peanut oil]; Penicillins; Tramadol; and Vitamin d analogs  Home Medications   Prior to Admission medications   Medication Sig Start Date End Date Taking? Authorizing Provider  aspirin EC 81 MG tablet Take 1 tablet by mouth  daily.   Yes [provider]  beta carotene w/minerals (OCUVITE) tablet Take 1 tablet by mouth daily.   Yes [provider]  gabapentin (NEURONTIN) 100 MG capsule Take 1 capsule by mouth at bedtime. 08/14/15  Yes [provider]  hydrOXYzine (ATARAX/VISTARIL) 10 MG tablet Take 1 tablet by mouth as needed. 08/08/15  Yes [provider]  LUMIGAN 0.01 % SOLN Place 1 drop into the left eye at bedtime. 08/25/15  Yes [provider]  montelukast (SINGULAIR) 10 MG tablet Take 10 mg by mouth at bedtime.   Yes [provider]  RESTASIS 0.05 % ophthalmic emulsion Apply 1 drop to eye 2 (two) times daily. 06/16/15  Yes [provider]  sertraline (ZOLOFT) 50 MG tablet Take 1 tablet by mouth daily. 06/22/15  Yes [provider]  simvastatin (ZOCOR) 10 MG tablet Take 10 mg by mouth at bedtime.   Yes [provider]  SYNTHROID 125 MCG tablet Take 1 tablet by mouth daily. 07/05/15  Yes [provider]   Meds Ordered and Administered this Visit   Medications  ibuprofen (ADVIL,MOTRIN) tablet 400 mg (400 mg Oral Given 04/17/17 1203)    BP (!) 126/56 (BP Location: Left Arm)   Pulse 89   Temp 98.5 F (36.9 C) (Oral)   Resp 18   Ht 5\' 8"  (1.727 m)   Wt 148 lb (67.1 kg)   SpO2 100%   BMI 22.50 kg/m  No data  found.   Physical Exam  Constitutional: She is oriented to person, place, and time. She appears well-developed and well-nourished. No distress.  HENT:  Head: Normocephalic and atraumatic.  Eyes: Conjunctivae are normal.  Neck: Neck supple.  Cardiovascular: Normal rate and regular rhythm.   No murmur heard. Pulmonary/Chest: Effort normal and breath sounds normal. No respiratory distress.  Abdominal: Soft. There is no tenderness.  Musculoskeletal: Normal range of motion. She exhibits tenderness. She exhibits no edema or deformity.  Neurological: She is alert and oriented to person, place, and time.  Skin: Skin  is warm and dry. Capillary refill takes less than 2 seconds.  Psychiatric: She has a normal mood and affect. Her behavior is normal. Judgment and thought content normal.  Nursing note and vitals reviewed.   Urgent Care Course     Procedures (including critical care time)  Labs Review Labs Reviewed - No data to display  Imaging Review Dg Toe 5th Right  Result Date: 04/17/2017 CLINICAL DATA:  Hit toe on stationary bike. EXAM: RIGHT FIFTH TOE COMPARISON:  None. FINDINGS: There is a slightly impacted fracture involving the proximal metadiaphysis of the proximal phalanx of the fifth digit without definitive intra-articular extension. Expected adjacent soft tissue swelling. No radiopaque foreign body. No dislocation. Joint spaces are preserved. IMPRESSION: Acute, slightly impacted fracture involving the proximal phalanx of the fifth digit without definitive intra-articular extension. Electronically Signed   By: Sandi Mariscal M.D.   On: 04/17/2017 12:09         MDM   1. Closed fracture of phalanx of right fifth toe, initial encounter     Right 5th toe xray, ibuprofen 400 mg po ordered for pain. Rest,ice,elevate right great toe. Your toe is broken. Buddy tape for support. May take tylenol 650 mg every 6 hours for pain and or ibuprofen 400 mg every 8 hours x 3 days for pain/swelling right toe. Follow up with PCP/[facility], podiatrist regarding further management of toe fracture. Pt verbalized understanding to this provider.    Tori Milks, NP 93/73/42 [phone]

## 2017-11-25 ENCOUNTER — Other Ambulatory Visit: Payer: Self-pay | Admitting: Family Medicine

## 2017-11-25 DIAGNOSIS — Z1231 Encounter for screening mammogram for malignant neoplasm of breast: Secondary | ICD-10-CM

## 2018-01-11 ENCOUNTER — Ambulatory Visit
Admission: RE | Admit: 2018-01-11 | Discharge: 2018-01-11 | Disposition: A | Discharge status: Home / Self Care | Payer: Medicare Other | Source: Ambulatory Visit | Attending: Family Medicine | Admitting: Family Medicine

## 2018-01-11 ENCOUNTER — Encounter (INDEPENDENT_AMBULATORY_CARE_PROVIDER_SITE_OTHER): Payer: Self-pay

## 2018-01-11 DIAGNOSIS — Z1231 Encounter for screening mammogram for malignant neoplasm of breast: Secondary | ICD-10-CM

## 2018-01-14 DIAGNOSIS — M7062 Trochanteric bursitis, left hip: Secondary | ICD-10-CM | POA: Insufficient documentation

## 2018-01-14 DIAGNOSIS — M1991 Primary osteoarthritis, unspecified site: Secondary | ICD-10-CM | POA: Insufficient documentation

## 2018-01-15 ENCOUNTER — Other Ambulatory Visit: Payer: Self-pay | Admitting: Unknown Physician Specialty

## 2018-01-15 DIAGNOSIS — Z8739 Personal history of other diseases of the musculoskeletal system and connective tissue: Secondary | ICD-10-CM

## 2018-01-21 ENCOUNTER — Ambulatory Visit
Admission: RE | Admit: 2018-01-21 | Discharge: 2018-01-21 | Disposition: A | Discharge status: Home / Self Care | Payer: Medicare Other | Source: Ambulatory Visit | Attending: Unknown Physician Specialty | Admitting: Unknown Physician Specialty

## 2018-01-21 DIAGNOSIS — Z8739 Personal history of other diseases of the musculoskeletal system and connective tissue: Secondary | ICD-10-CM

## 2018-01-21 DIAGNOSIS — M5126 Other intervertebral disc displacement, lumbar region: Secondary | ICD-10-CM | POA: Insufficient documentation

## 2018-01-21 DIAGNOSIS — G8929 Other chronic pain: Secondary | ICD-10-CM | POA: Diagnosis present

## 2018-01-21 DIAGNOSIS — M48061 Spinal stenosis, lumbar region without neurogenic claudication: Secondary | ICD-10-CM | POA: Insufficient documentation

## 2018-01-21 DIAGNOSIS — M47816 Spondylosis without myelopathy or radiculopathy, lumbar region: Secondary | ICD-10-CM | POA: Insufficient documentation

## 2018-01-21 DIAGNOSIS — M5136 Other intervertebral disc degeneration, lumbar region: Secondary | ICD-10-CM | POA: Insufficient documentation

## 2018-01-21 DIAGNOSIS — M545 Low back pain: Secondary | ICD-10-CM | POA: Diagnosis present

## 2018-05-05 ENCOUNTER — Other Ambulatory Visit (HOSPITAL_COMMUNITY): Payer: Self-pay | Admitting: Family Medicine

## 2018-05-05 DIAGNOSIS — E041 Nontoxic single thyroid nodule: Secondary | ICD-10-CM

## 2018-05-11 ENCOUNTER — Encounter (INDEPENDENT_AMBULATORY_CARE_PROVIDER_SITE_OTHER): Payer: Self-pay

## 2018-05-11 ENCOUNTER — Ambulatory Visit
Admission: RE | Admit: 2018-05-11 | Discharge: 2018-05-11 | Disposition: A | Discharge status: Home / Self Care | Payer: Medicare Other | Source: Ambulatory Visit | Attending: Family Medicine | Admitting: Family Medicine

## 2018-05-11 DIAGNOSIS — E042 Nontoxic multinodular goiter: Secondary | ICD-10-CM | POA: Diagnosis not present

## 2018-05-11 DIAGNOSIS — E041 Nontoxic single thyroid nodule: Secondary | ICD-10-CM

## 2018-07-22 ENCOUNTER — Telehealth: Payer: Self-pay | Admitting: *Deleted

## 2018-07-22 ENCOUNTER — Other Ambulatory Visit: Payer: Self-pay

## 2018-07-22 ENCOUNTER — Ambulatory Visit
Payer: Medicare Other | Attending: Student in an Organized Health Care Education/Training Program | Admitting: Student in an Organized Health Care Education/Training Program

## 2018-07-22 ENCOUNTER — Encounter: Payer: Self-pay | Admitting: Student in an Organized Health Care Education/Training Program

## 2018-07-22 VITALS — BP 134/89 | HR 78 | Temp 98.1°F | Ht 68.0 in | Wt 162.0 lb

## 2018-07-22 DIAGNOSIS — G894 Chronic pain syndrome: Secondary | ICD-10-CM

## 2018-07-22 DIAGNOSIS — G588 Other specified mononeuropathies: Secondary | ICD-10-CM | POA: Insufficient documentation

## 2018-07-22 DIAGNOSIS — G629 Polyneuropathy, unspecified: Secondary | ICD-10-CM

## 2018-07-22 DIAGNOSIS — M47816 Spondylosis without myelopathy or radiculopathy, lumbar region: Secondary | ICD-10-CM

## 2018-07-22 DIAGNOSIS — M5136 Other intervertebral disc degeneration, lumbar region: Secondary | ICD-10-CM

## 2018-07-22 DIAGNOSIS — M5416 Radiculopathy, lumbar region: Secondary | ICD-10-CM

## 2018-07-22 DIAGNOSIS — M5126 Other intervertebral disc displacement, lumbar region: Secondary | ICD-10-CM | POA: Diagnosis not present

## 2018-07-22 NOTE — Progress Notes (Signed)
Patient's Name: Sarah [name]  MRN: [id number]  Referring Provider: Meade Maw, MD  DOB: May 12, 1932  PCP: Hortencia Pilar, MD  DOS: 07/22/2018  Note by: Gillis Santa, MD  Service setting: Ambulatory outpatient  Specialty: Interventional Pain Management  Location: [facility] (AMB) Pain Management Facility  Visit type: Initial Patient Evaluation  Patient type: New Patient   Primary Reason(s) for Visit: Encounter for initial evaluation of one or more chronic problems (new to examiner) potentially causing chronic pain, and posing a threat to normal musculoskeletal function. (Level of risk: High) CC: Back Pain  HPI  Sarah [name] is a 84 y.o. year old, female patient, who comes today to see [place] for the first time for an initial evaluation of her chronic pain. She has Closed fracture of fifth toe of right foot; Lumbar facet arthropathy; Lumbar radiculopathy; Lumbar disc herniation (L2/L3); Lumbar degenerative disc disease; Chronic pain syndrome; and Neuropathy on their problem list. Today she comes in for evaluation of her Back Pain  Pain Assessment: Location: Left, Right Back Radiating: Denies Onset: More than a month ago Duration: Chronic pain Quality: Aching, Discomfort, Constant Severity: 6 /10 (subjective, self-reported pain score)  Note: Reported level is inconsistent with clinical observations.                         When using our objective Pain Scale, levels between 6 and 10/10 are said to belong in an emergency room, as it progressively worsens from a 6/10, described as severely limiting, requiring emergency care not usually available at an outpatient pain management facility. At a 6/10 level, communication becomes difficult and requires great effort. Assistance to reach the emergency department may be required. Facial flushing and profuse sweating along with potentially dangerous increases in heart rate and blood pressure will be evident. Effect on ADL: the pain slows me  down Timing: Constant Modifying factors: heating and medication BP: 134/89  HR: 78  Onset and Duration: Present longer than 3 months Cause of pain: Unknown Severity: Getting worse, NAS-11 at its worse: 10/10, NAS-11 at its best: 10/10, NAS-11 now: 8/10 and NAS-11 on the average: 9/10 Timing: Afternoon and During activity or exercise Aggravating Factors: Bending, Climbing, Kneeling, Lifiting, Prolonged sitting, Prolonged standing and Twisting Alleviating Factors: Hot packs, Resting, Sitting and Relaxation therapy Associated Problems: Night-time cramps, Numbness, Temperature changes and Weakness Quality of Pain: Aching, Burning, Getting longer, Tingling, Toothache-like and Uncomfortable Previous Examinations or Tests: CT scan, MRI scan, Nerve block, X-rays, Nerve conduction test, Neurological evaluation and Neurosurgical evaluation Previous Treatments: Physical Therapy, Relaxation therapy, Stretching exercises and Trigger point injections  The patient comes into the clinics today for the first time for a chronic pain management evaluation.   Patient is a pleasant 84 year old female with a chief complaint of axial low back pain as well as bilateral leg pain.  Patient has completed physical therapy.  Patient is also had lumbar facet medial branch nerve blocks which were not beneficial.  She is also had right L5-S1 transforaminal ESI as well as left S1 transforaminal ESI performed in May and June 2019 which only provided short-term benefit.  Patient is not very interested in surgical intervention.  She has tried various medications including anti-inflammatories, Lyrica, Tylenol.  She is being referred here for consideration of spinal cord stimulator trial.  Patient denies any bowel or bladder issues.   Historical Background Evaluation: [place] PMP: Six (6) year initial data search conducted.  [place] Department of public safety, offender search: Editor, commissioning Information) Non-contributory  Personal  History of Substance Abuse (SUD-Substance use disorder):  Alcohol: Negative  Illegal Drugs: Negative  Rx Drugs: Negative  ORT Risk Level calculation: Low Risk Opioid Risk Tool - 07/22/18 1229      Family History of Substance Abuse   Alcohol  Negative    Illegal Drugs  Negative    Rx Drugs  Negative      Personal History of Substance Abuse   Alcohol  Negative    Illegal Drugs  Negative    Rx Drugs  Negative      Age   Age between 41-45 years   No      History of Preadolescent Sexual Abuse   History of Preadolescent Sexual Abuse  Negative or Female      Psychological Disease   Psychological Disease  Negative    Depression  Negative      Total Score   Opioid Risk Tool Scoring  0    Opioid Risk Interpretation  Low Risk      ORT Scoring interpretation table:  Score <3 = Low Risk for SUD  Score between 4-7 = Moderate Risk for SUD  Score >8 = High Risk for Opioid Abuse   PHQ-2 Depression Scale:  Total score: 0  PHQ-2 Scoring interpretation table: (Score and probability of major depressive disorder)  Score 0 = No depression  Score 1 = 15.4% Probability  Score 2 = 21.1% Probability  Score 3 = 38.4% Probability  Score 4 = 45.5% Probability  Score 5 = 56.4% Probability  Score 6 = 78.6% Probability   PHQ-9 Depression Scale:  Total score: 0  PHQ-9 Scoring interpretation table:  Score 0-4 = No depression  Score 5-9 = Mild depression  Score 10-14 = Moderate depression  Score 15-19 = Moderately severe depression  Score 20-27 = Severe depression (2.4 times higher risk of SUD and 2.89 times higher risk of overuse)    Meds   Current Outpatient Medications:  .  Apoaequorin (PREVAGEN PO), Take by mouth once., Disp: , Rfl:  .  beta carotene w/minerals (OCUVITE) tablet, Take 1 tablet by mouth daily., Disp: , Rfl:  .  dicyclomine (BENTYL) 10 MG capsule, Take 10 mg by mouth 4 (four) times daily -  before meals and at bedtime., Disp: , Rfl:  .  lovastatin (MEVACOR) 20 MG tablet,  Take 20 mg by mouth at bedtime., Disp: , Rfl:  .  LUMIGAN 0.01 % SOLN, Place 1 drop into the left eye at bedtime., Disp: , Rfl:  .  Melatonin 1 MG TABS, Take 10 mg by mouth once., Disp: , Rfl:  .  phenazopyridine (PYRIDIUM) 97 MG tablet, Take 97 mg by mouth once., Disp: , Rfl:  .  pregabalin (LYRICA) 100 MG capsule, Take 100 mg by mouth 3 (three) times daily., Disp: , Rfl:  .  sertraline (ZOLOFT) 50 MG tablet, Take 1 tablet by mouth daily., Disp: , Rfl:  .  simvastatin (ZOCOR) 10 MG tablet, Take 10 mg by mouth at bedtime., Disp: , Rfl:  .  SYNTHROID 125 MCG tablet, Take 1 tablet by mouth daily., Disp: , Rfl:  .  aspirin EC 81 MG tablet, Take 1 tablet by mouth daily., Disp: , Rfl:  .  gabapentin (NEURONTIN) 100 MG capsule, Take 1 capsule by mouth at bedtime., Disp: , Rfl:  .  hydrOXYzine (ATARAX/VISTARIL) 10 MG tablet, Take 1 tablet by mouth as needed., Disp: , Rfl:  .  montelukast (SINGULAIR) 10 MG tablet, Take 10 mg by mouth at bedtime., Disp: , Rfl:  .  RESTASIS 0.05 % ophthalmic emulsion, Apply 1 drop to eye 2 (two) times daily., Disp: , Rfl:   Imaging Review  Lumbosacral Imaging: Lumbar MR wo contrast:  Results for orders placed during the hospital encounter of 01/21/18  MR LUMBAR SPINE WO CONTRAST   Narrative CLINICAL DATA:  Chronic low back and bilateral buttock pain. Leg weakness. Bilateral foot numbness.  EXAM: MRI LUMBAR SPINE WITHOUT CONTRAST  TECHNIQUE: Multiplanar, multisequence MR imaging of the lumbar spine was performed. No intravenous contrast was administered.  COMPARISON:  CT abdomen and pelvis 01/31/2015  FINDINGS: Segmentation:  Standard.  Alignment:  Minimal retrolisthesis of L3 on L4 and L4 on L5.  Vertebrae: No fracture or suspicious osseous lesion. Degenerative endplate changes from I0-9 to L5-S1 including minimal edema. Small Schmorl's nodes from L1-L5.  Conus medullaris and cauda equina: Conus extends to the L1 level. Conus and cauda equina appear  normal.  Paraspinal and other soft tissues: Unremarkable.  Disc levels:  Disc desiccation throughout the lumbar spine. Severe disc space narrowing from L2-3 to L4-5.  L1-2: Mild disc bulging and mild facet and ligamentum flavum hypertrophy without stenosis.  L2-3: Circumferential disc bulging, right foraminal and extraforaminal disc protrusion, and mild facet arthrosis result in moderate to severe spinal and bilateral lateral recess stenosis and minimal right neural foraminal stenosis. The disc protrusion contacts and displaces the right extraforaminal L2 nerve. The L3 nerve roots may be affected in the lateral recesses.  L3-4: Circumferential disc bulging, endplate spurring, and mild-to-moderate left greater than right facet hypertrophy result in mild right and mild-to-moderate left lateral recess stenosis and mild right and moderate left neural foraminal stenosis.  L4-5: Circumferential disc bulging, endplate spurring, and moderate right greater than left facet hypertrophy result in mild bilateral lateral recess stenosis and mild right greater than left neural foraminal stenosis.  L5-S1: Mild disc bulging, ligamentum flavum thickening, and moderate to severe bilateral facet arthrosis result in mild right and moderate left lateral recess stenosis and mild-to-moderate left neural foraminal stenosis. Potential left S1 nerve root impingement.  IMPRESSION: 1. Advanced multilevel disc degeneration, most notable at L2-3 where there is moderate to severe spinal and lateral recess stenosis. Potential right L2 nerve impingement by and extraforaminal disc protrusion. 2. Lumbar facet arthrosis most severe at L5-S1.   Electronically Signed   By: Logan Bores M.D.   On: 01/21/2018 11:51     Complexity Note: Imaging results reviewed. Results shared with Sarah [name], using Layman's terms.                         ROS  Cardiovascular: Needs antibiotics prior to dental  procedures Pulmonary or Respiratory: Shortness of breath and Snoring  Neurological: No reported neurological signs or symptoms such as seizures, abnormal skin sensations, urinary and/or fecal incontinence, being born with an abnormal open spine and/or a tethered spinal cord Review of Past Neurological Studies: No results found for this or any previous visit. Psychological-Psychiatric: No reported psychological or psychiatric signs or symptoms such as difficulty sleeping, anxiety, depression, delusions or hallucinations (schizophrenial), mood swings (bipolar disorders) or suicidal ideations or attempts Gastrointestinal: Alternating episodes iof diarrhea and constipation (IBS-Irritable bowe syndrome) Genitourinary: No reported renal or genitourinary signs or symptoms such as difficulty voiding or producing urine, peeing blood, non-functioning kidney, kidney stones, difficulty emptying the bladder, difficulty controlling the flow of urine, or chronic kidney  disease Hematological: Brusing easily and Bleeding easily Endocrine: Slow thyroid Rheumatologic: Joint aches and or swelling due to excess weight (Osteoarthritis) Musculoskeletal: Negative for myasthenia gravis, muscular dystrophy, multiple sclerosis or malignant hyperthermia Work History: Retired  Allergies  Sarah [name] is allergic to codeine; hydrocodone; peanuts [peanut oil]; penicillins; tramadol; and vitamin d analogs.  Laboratory Chemistry  Inflammation Markers (CRP: Acute Phase) (ESR: Chronic Phase) No results found for: CRP, ESRSEDRATE, LATICACIDVEN                       Rheumatology Markers No results found for: RF, ANA, LABURIC, URICUR, LYMEIGGIGMAB, LYMEABIGMQN, HLAB27                      Renal Function Markers Lab Results  Component Value Date   BUN 23 (H) 09/14/2015   CREATININE 0.80 09/14/2015   GFRAA >60 09/14/2015   GFRNONAA >60 09/14/2015                             Hepatic Function Markers Lab Results  Component  Value Date   AST 19 11/26/2015   ALT 15 11/26/2015   ALBUMIN 4.7 11/26/2015   ALKPHOS 64 11/26/2015                        Electrolytes Lab Results  Component Value Date   NA 141 09/14/2015   K 3.5 09/14/2015   CL 107 09/14/2015   CALCIUM 9.7 09/14/2015   MG 2.2 09/14/2015                        Coagulation Parameters Lab Results  Component Value Date   INR 1.08 09/14/2015   LABPROT 14.2 09/14/2015   APTT 31 09/14/2015   PLT 177 09/14/2015                        Cardiovascular Markers Lab Results  Component Value Date   CKTOTAL 66 02/12/2013   CKMB < 0.5 (L) 02/12/2013   TROPONINI <0.03 09/14/2015   HGB 13.2 09/14/2015   HCT 38.3 09/14/2015                         CA Markers No results found for: CEA, CA125, LABCA2                      Note: Lab results reviewed.  [place]  Drug: Sarah [name]  has no drug history on file. Alcohol:  reports that she does not drink alcohol. Tobacco:  reports that she has never smoked. She has never used smokeless tobacco. Medical:  has a past medical history of Cancer ([place]), Seizures ([place]), and Thyroid disease. Family: family history includes Breast cancer in her maternal aunt; Breast cancer (age of onset: 81) in her maternal aunt; Breast cancer (age of onset: 34) in her mother; Breast cancer (age of onset: 68) in her maternal uncle.  Past Surgical History:  Procedure Laterality Date  . ABDOMINAL HYSTERECTOMY    . BREAST BIOPSY Bilateral    neg   Active Ambulatory Problems    Diagnosis Date Noted  . Closed fracture of fifth toe of right foot 04/17/2017  . Lumbar facet arthropathy 12/07/2012  . Lumbar radiculopathy 07/22/2018  . Lumbar disc herniation (L2/L3) 07/22/2018  . Lumbar degenerative disc disease 07/22/2018  .  Chronic pain syndrome 07/22/2018  . Neuropathy 07/22/2018   Resolved Ambulatory Problems    Diagnosis Date Noted  . No Resolved Ambulatory Problems   Past Medical History:  Diagnosis Date  . Cancer ([place])    . Seizures ([place])   . Thyroid disease    Constitutional Exam  General appearance: Well nourished, well developed, and well hydrated. In no apparent acute distress Vitals:   07/22/18 1207  BP: 134/89  Pulse: 78  Temp: 98.1 F (36.7 C)  SpO2: 100%  Weight: 162 lb (73.5 kg)  Height: 5' 8"  (1.727 m)   BMI Assessment: Estimated body mass index is 24.63 kg/m as calculated from the following:   Height as of this encounter: 5' 8"  (1.727 m).   Weight as of this encounter: 162 lb (73.5 kg).  BMI interpretation table: BMI level Category Range association with higher incidence of chronic pain  <18 kg/m2 Underweight   18.5-24.9 kg/m2 Ideal body weight   25-29.9 kg/m2 Overweight Increased incidence by 20%  30-34.9 kg/m2 Obese (Class I) Increased incidence by 68%  35-39.9 kg/m2 Severe obesity (Class II) Increased incidence by 136%  >40 kg/m2 Extreme obesity (Class III) Increased incidence by 254%   Patient's current BMI Ideal Body weight  Body mass index is 24.63 kg/m. Ideal body weight: 63.9 kg (140 lb 14 oz) Adjusted ideal body weight: 67.7 kg (149 lb 5.2 oz)   BMI Readings from Last 4 Encounters:  07/22/18 24.63 kg/m  04/17/17 22.50 kg/m  09/14/15 24.94 kg/m  01/31/15 24.94 kg/m   Wt Readings from Last 4 Encounters:  07/22/18 162 lb (73.5 kg)  04/17/17 148 lb (67.1 kg)  09/14/15 164 lb (74.4 kg)  01/31/15 164 lb (74.4 kg)  Psych/Mental status: Alert, oriented x 3 (person, place, & time)       Eyes: PERLA Respiratory: No evidence of acute respiratory distress  Cervical Spine Area Exam  Skin & Axial Inspection: No masses, redness, edema, swelling, or associated skin lesions Alignment: Symmetrical Functional ROM: Unrestricted ROM      Stability: No instability detected Muscle Tone/Strength: Functionally intact. No obvious neuro-muscular anomalies detected. Sensory (Neurological): Unimpaired Palpation: No palpable anomalies              Upper Extremity (UE) Exam     Side: Right upper extremity  Side: Left upper extremity  Skin & Extremity Inspection: Skin color, temperature, and hair growth are WNL. No peripheral edema or cyanosis. No masses, redness, swelling, asymmetry, or associated skin lesions. No contractures.  Skin & Extremity Inspection: Skin color, temperature, and hair growth are WNL. No peripheral edema or cyanosis. No masses, redness, swelling, asymmetry, or associated skin lesions. No contractures.  Functional ROM: Unrestricted ROM          Functional ROM: Unrestricted ROM          Muscle Tone/Strength: Functionally intact. No obvious neuro-muscular anomalies detected.  Muscle Tone/Strength: Functionally intact. No obvious neuro-muscular anomalies detected.  Sensory (Neurological): Unimpaired          Sensory (Neurological): Unimpaired          Palpation: No palpable anomalies              Palpation: No palpable anomalies              Provocative Test(s):  Phalen's test: deferred Tinel's test: deferred Apley's scratch test (touch opposite shoulder):  Action 1 (Across chest): deferred Action 2 (Overhead): deferred Action 3 (LB reach): deferred   Provocative Test(s):  Phalen's  test: deferred Tinel's test: deferred Apley's scratch test (touch opposite shoulder):  Action 1 (Across chest): deferred Action 2 (Overhead): deferred Action 3 (LB reach): deferred    Thoracic Spine Area Exam  Skin & Axial Inspection: No masses, redness, or swelling Alignment: Symmetrical Functional ROM: Unrestricted ROM Stability: No instability detected Muscle Tone/Strength: Functionally intact. No obvious neuro-muscular anomalies detected. Sensory (Neurological): Unimpaired Muscle strength & Tone: No palpable anomalies  Lumbar Spine Area Exam  Skin & Axial Inspection: No masses, redness, or swelling Alignment: Symmetrical Functional ROM: Decreased ROM       Stability: No instability detected Muscle Tone/Strength: Functionally intact. No obvious  neuro-muscular anomalies detected. Sensory (Neurological): Musculoskeletal pain pattern and dermatomal Palpation: No palpable anomalies       Provocative Tests: Hyperextension/rotation test: (+) bilaterally for facet joint pain. Lumbar quadrant test (Kemp's test): (+) bilateral for foraminal stenosis left greater than right Lateral bending test: deferred today       Patrick's Maneuver: deferred today                   FABER test: deferred today                   S-I anterior distraction/compression test: deferred today         S-I lateral compression test: deferred today         S-I Thigh-thrust test: deferred today         S-I Gaenslen's test: deferred today          Gait & Posture Assessment  Ambulation: Limited Gait: Antalgic Posture: Difficulty standing up straight, due to pain   Lower Extremity Exam    Side: Right lower extremity  Side: Left lower extremity  Stability: No instability observed          Stability: No instability observed          Skin & Extremity Inspection: Skin color, temperature, and hair growth are WNL. No peripheral edema or cyanosis. No masses, redness, swelling, asymmetry, or associated skin lesions. No contractures.  Skin & Extremity Inspection: Skin color, temperature, and hair growth are WNL. No peripheral edema or cyanosis. No masses, redness, swelling, asymmetry, or associated skin lesions. No contractures.  Functional ROM: Decreased ROM for hip and knee joints          Functional ROM: Decreased ROM for hip and knee joints          Muscle Tone/Strength: Functionally intact. No obvious neuro-muscular anomalies detected.  Muscle Tone/Strength: Functionally intact. No obvious neuro-muscular anomalies detected.  Sensory (Neurological): Dermatomal pain pattern  Sensory (Neurological): Dermatomal pain pattern  Palpation: No palpable anomalies  Palpation: No palpable anomalies  4+ out of 5 strength bilateral lower extremity: Plantar flexion, dorsiflexion, knee  flexion, knee extension.  Assessment  Primary Diagnosis & Pertinent Problem List: The primary encounter diagnosis was Lumbar radiculopathy. Diagnoses of Lumbar disc herniation (L2/L3), Lumbar facet arthropathy (L5/S1), Lumbar degenerative disc disease, Chronic pain syndrome, and Neuropathy were also pertinent to this visit.  Visit Diagnosis (New problems to examiner): 1. Lumbar radiculopathy   2. Lumbar disc herniation (L2/L3)   3. Lumbar facet arthropathy (L5/S1)   4. Lumbar degenerative disc disease   5. Chronic pain syndrome   6. Neuropathy   General Recommendations: The pain condition that the patient suffers from is best treated with a multidisciplinary approach that involves an increase in physical activity to prevent de-conditioning and worsening of the pain cycle, as well as psychological counseling (formal  and/or informal) to address the co-morbid psychological affects of pain. Treatment will often involve judicious use of pain medications and interventional procedures to decrease the pain, allowing the patient to participate in the physical activity that will ultimately produce long-lasting pain reductions. The goal of the multidisciplinary approach is to return the patient to a higher level of overall function and to restore their ability to perform activities of daily living.  Patient is a pleasant 84 year old female with a chief complaint of axial low back pain as well as bilateral leg pain.  Patient has completed physical therapy.  Patient is also had lumbar facet medial branch nerve blocks which were not beneficial.  She is also had right L5-S1 transforaminal ESI as well as left S1 transforaminal ESI performed in May and June 2019 which only provided short-term benefit.  Patient is not very interested in surgical intervention.  She has tried various medications including anti-inflammatories, Lyrica, Tylenol.  She is being referred here for consideration of spinal cord stimulator  trial.  I had an extensive discussion with the patient about therapeutic options.  We reviewed her lumbar MRI in detail.  Patient does have a lumbar disc herniation at L2 which could be contributing to her symptoms.  Patient does endorse groin anterior thigh pain which could be secondary to L2 and L3 radiculopathy.  Patient's prior lumbar transfemoral epidural steroid injections were at L5-S1 and S1 respectively.  I think it may be worthwhile to target the L2-L3 area and attempt an interlaminar epidural steroid injection there.  Risks and benefits of this procedure were discussed in detail and patient would like to proceed.  We also had a discussion about spinal cord stimulation.  Patient could be a candidate but she would like to think about this further.  Risks and benefits of this procedure were discussed.  We will start with L2-L3 ESI if not effective can consider spinal cord stimulation further.  Patient will need thoracic MRI as well as psychological evaluation.  She is not on any blood thinners.  She is not a diabetic.  She does not smoke.  Plan of Care (Initial workup plan)  Note: Please be advised that as per protocol, today's visit has been an evaluation only. We have not taken over the patient's controlled substance management.  Ordered Lab-work, Procedure(s), Referral(s), & Consult(s): Orders Placed This Encounter  Procedures  . Lumbar Epidural Injection    Interventional management options: Sarah [name] was informed that there is no guarantee that she would be a candidate for interventional therapies. The decision will be based on the results of diagnostic studies, as well as Sarah [name]'s risk profile.  Procedure(s) under consideration:  Left L2-L3 ESI Spinal cord stimulator trial   Provider-requested follow-up: Return in about 1 week (around 07/29/2018) for Procedure.  Future Appointments  Date Time Provider [place]  07/26/2018 10:00 AM Gillis Santa, MD [facility] None     Primary Care Physician: Hortencia Pilar, MD Location: [facility] Note by: Gillis Santa, M.D, Date: 07/22/2018; Time: 1:41 PM  Patient Instructions  ____________________________________________________________________________________________  Preparing for Procedure with Sedation  Instructions: . Oral Intake: Do not eat or drink anything for at least 8 hours prior to your procedure. . Transportation: Public transportation is not allowed. Bring an adult driver. The driver must be physically present in our waiting room before any procedure can be started. [place] Physical Assistance: Bring an adult physically capable of assisting you, in the event you need help. This adult should keep  you company at home for at least 6 hours after the procedure. . Blood Pressure Medicine: Take your blood pressure medicine with a sip of water the morning of the procedure. . Blood thinners: Notify our staff if you are taking any blood thinners. Depending on which one you take, there will be specific instructions on how and when to stop it. . Diabetics on insulin: Notify the staff so that you can be scheduled 1st case in the morning. If your diabetes requires high dose insulin, take only  of your normal insulin dose the morning of the procedure and notify the staff that you have done so. . Preventing infections: Shower with an antibacterial soap the morning of your procedure. . Build-up your immune system: Take 1000 mg of Vitamin C with every meal (3 times a day) the day prior to your procedure. Marland Kitchen Antibiotics: Inform the staff if you have a condition or reason that requires you to take antibiotics before dental procedures. . Pregnancy: If you are pregnant, call and cancel the procedure. . Sickness: If you have a cold, fever, or any active infections, call and cancel the procedure. . Arrival: You must be in the facility at least 30 minutes prior to your scheduled procedure. . Children:  Do not bring children with you. . Dress appropriately: Bring dark clothing that you would not mind if they get stained. . Valuables: Do not bring any jewelry or valuables.  Procedure appointments are reserved for interventional treatments only. Marland Kitchen No Prescription Refills. . No medication changes will be discussed during procedure appointments. . No disability issues will be discussed.  Reasons to call and reschedule or cancel your procedure: (Following these recommendations will minimize the risk of a serious complication.) . Surgeries: Avoid having procedures within 2 weeks of any surgery. (Avoid for 2 weeks before or after any surgery). . Flu Shots: Avoid having procedures within 2 weeks of a flu shots or . (Avoid for 2 weeks before or after immunizations). . Barium: Avoid having a procedure within 7-10 days after having had a radiological study involving the use of radiological contrast. (Myelograms, Barium swallow or enema study). . Heart attacks: Avoid any elective procedures or surgeries for the initial 6 months after a "Myocardial Infarction" (Heart Attack). . Blood thinners: It is imperative that you stop these medications before procedures. Let us know if you if you take any blood thinner.  . Infection: Avoid procedures during or within two weeks of an infection (including chest colds or gastrointestinal problems). Symptoms associated with infections include: Localized redness, fever, chills, night sweats or profuse sweating, burning sensation when voiding, cough, congestion, stuffiness, runny nose, sore throat, diarrhea, nausea, vomiting, cold or Flu symptoms, recent or current infections. It is specially important if the infection is over the area that we intend to treat. Marland Kitchen Heart and lung problems: Symptoms that may suggest an active cardiopulmonary problem include: cough, chest pain, breathing difficulties or shortness of breath, dizziness, ankle swelling, uncontrolled high or unusually  low blood pressure, and/or palpitations. If you are experiencing any of these symptoms, cancel your procedure and contact your primary care physician for an evaluation.  Remember:  Regular Business hours are:  Monday to Thursday 8:00 AM to 4:00 PM  Provider's Schedule: Milinda Pointer, MD:  Procedure days: Tuesday and Thursday 7:30 AM to 4:00 PM  Gillis Santa, MD:  Procedure days: Monday and Wednesday 7:30 AM to 4:00 PM ____________________________________________________________________________________________

## 2018-07-22 NOTE — Patient Instructions (Signed)
____________________________________________________________________________________________  Preparing for Procedure with Sedation  Instructions: . Oral Intake: Do not eat or drink anything for at least 8 hours prior to your procedure. . Transportation: Public transportation is not allowed. Bring an adult driver. The driver must be physically present in our waiting room before any procedure can be started. . Physical Assistance: Bring an adult physically capable of assisting you, in the event you need help. This adult should keep you company at home for at least 6 hours after the procedure. . Blood Pressure Medicine: Take your blood pressure medicine with a sip of water the morning of the procedure. . Blood thinners: Notify our staff if you are taking any blood thinners. Depending on which one you take, there will be specific instructions on how and when to stop it. . Diabetics on insulin: Notify the staff so that you can be scheduled 1st case in the morning. If your diabetes requires high dose insulin, take only  of your normal insulin dose the morning of the procedure and notify the staff that you have done so. . Preventing infections: Shower with an antibacterial soap the morning of your procedure. . Build-up your immune system: Take 1000 mg of Vitamin C with every meal (3 times a day) the day prior to your procedure. . Antibiotics: Inform the staff if you have a condition or reason that requires you to take antibiotics before dental procedures. . Pregnancy: If you are pregnant, call and cancel the procedure. . Sickness: If you have a cold, fever, or any active infections, call and cancel the procedure. . Arrival: You must be in the facility at least 30 minutes prior to your scheduled procedure. . Children: Do not bring children with you. . Dress appropriately: Bring dark clothing that you would not mind if they get stained. . Valuables: Do not bring any jewelry or valuables.  Procedure  appointments are reserved for interventional treatments only. . No Prescription Refills. . No medication changes will be discussed during procedure appointments. . No disability issues will be discussed.  Reasons to call and reschedule or cancel your procedure: (Following these recommendations will minimize the risk of a serious complication.) . Surgeries: Avoid having procedures within 2 weeks of any surgery. (Avoid for 2 weeks before or after any surgery). . Flu Shots: Avoid having procedures within 2 weeks of a flu shots or . (Avoid for 2 weeks before or after immunizations). . Barium: Avoid having a procedure within 7-10 days after having had a radiological study involving the use of radiological contrast. (Myelograms, Barium swallow or enema study). . Heart attacks: Avoid any elective procedures or surgeries for the initial 6 months after a "Myocardial Infarction" (Heart Attack). . Blood thinners: It is imperative that you stop these medications before procedures. Let us know if you if you take any blood thinner.  . Infection: Avoid procedures during or within two weeks of an infection (including chest colds or gastrointestinal problems). Symptoms associated with infections include: Localized redness, fever, chills, night sweats or profuse sweating, burning sensation when voiding, cough, congestion, stuffiness, runny nose, sore throat, diarrhea, nausea, vomiting, cold or Flu symptoms, recent or current infections. It is specially important if the infection is over the area that we intend to treat. . Heart and lung problems: Symptoms that may suggest an active cardiopulmonary problem include: cough, chest pain, breathing difficulties or shortness of breath, dizziness, ankle swelling, uncontrolled high or unusually low blood pressure, and/or palpitations. If you are experiencing any of these symptoms, cancel   your procedure and contact your primary care physician for an evaluation.  Remember:   Regular Business hours are:  Monday to Thursday 8:00 AM to 4:00 PM  Provider's Schedule: Francisco Naveira, MD:  Procedure days: Tuesday and Thursday 7:30 AM to 4:00 PM  Bilal Lateef, MD:  Procedure days: Monday and Wednesday 7:30 AM to 4:00 PM ____________________________________________________________________________________________    

## 2018-07-22 NOTE — Telephone Encounter (Signed)
Last knee replacement was 5 years ago. Patient advised that we give antibiotics for implanted hardware <6 months ago.

## 2018-07-26 ENCOUNTER — Other Ambulatory Visit: Payer: Self-pay

## 2018-07-26 ENCOUNTER — Ambulatory Visit (HOSPITAL_BASED_OUTPATIENT_CLINIC_OR_DEPARTMENT_OTHER): Payer: Medicare Other | Admitting: Student in an Organized Health Care Education/Training Program

## 2018-07-26 ENCOUNTER — Ambulatory Visit
Admission: RE | Admit: 2018-07-26 | Discharge: 2018-07-26 | Disposition: A | Discharge status: Home / Self Care | Payer: Medicare Other | Source: Ambulatory Visit | Attending: Student in an Organized Health Care Education/Training Program | Admitting: Student in an Organized Health Care Education/Training Program

## 2018-07-26 ENCOUNTER — Encounter: Payer: Self-pay | Admitting: Student in an Organized Health Care Education/Training Program

## 2018-07-26 VITALS — BP 167/78 | HR 66 | Temp 97.9°F | Resp 12 | Ht 68.0 in | Wt 162.0 lb

## 2018-07-26 DIAGNOSIS — Z79899 Other long term (current) drug therapy: Secondary | ICD-10-CM | POA: Diagnosis not present

## 2018-07-26 DIAGNOSIS — M5126 Other intervertebral disc displacement, lumbar region: Secondary | ICD-10-CM

## 2018-07-26 DIAGNOSIS — M545 Low back pain: Secondary | ICD-10-CM | POA: Diagnosis present

## 2018-07-26 DIAGNOSIS — Z880 Allergy status to penicillin: Secondary | ICD-10-CM | POA: Insufficient documentation

## 2018-07-26 DIAGNOSIS — M5416 Radiculopathy, lumbar region: Secondary | ICD-10-CM | POA: Diagnosis not present

## 2018-07-26 DIAGNOSIS — M5116 Intervertebral disc disorders with radiculopathy, lumbar region: Secondary | ICD-10-CM | POA: Diagnosis not present

## 2018-07-26 DIAGNOSIS — Z888 Allergy status to other drugs, medicaments and biological substances status: Secondary | ICD-10-CM | POA: Insufficient documentation

## 2018-07-26 DIAGNOSIS — Z7982 Long term (current) use of aspirin: Secondary | ICD-10-CM | POA: Diagnosis not present

## 2018-07-26 DIAGNOSIS — Z885 Allergy status to narcotic agent status: Secondary | ICD-10-CM | POA: Diagnosis not present

## 2018-07-26 DIAGNOSIS — Z91010 Allergy to peanuts: Secondary | ICD-10-CM | POA: Insufficient documentation

## 2018-07-26 MED ORDER — LIDOCAINE HCL 2 % IJ SOLN
10.0000 mL | Freq: Once | INTRAMUSCULAR | Status: AC
  Administered 2018-07-26: 400 mg

## 2018-07-26 MED ORDER — FENTANYL CITRATE (PF) 100 MCG/2ML IJ SOLN
INTRAMUSCULAR | Status: AC
  Filled 2018-07-26: qty 2

## 2018-07-26 MED ORDER — SODIUM CHLORIDE 0.9% FLUSH
2.0000 mL | Freq: Once | INTRAVENOUS | Status: AC
  Administered 2018-07-26: 2 mL

## 2018-07-26 MED ORDER — LIDOCAINE HCL 2 % IJ SOLN
INTRAMUSCULAR | Status: AC
Start: 2018-07-26 — End: 2018-07-26
  Filled 2018-07-26: qty 20

## 2018-07-26 MED ORDER — DEXAMETHASONE SODIUM PHOSPHATE 10 MG/ML IJ SOLN
10.0000 mg | Freq: Once | INTRAMUSCULAR | Status: AC
  Administered 2018-07-26: 10 mg

## 2018-07-26 MED ORDER — SODIUM CHLORIDE 0.9 % IJ SOLN
INTRAMUSCULAR | Status: AC
  Filled 2018-07-26: qty 10

## 2018-07-26 MED ORDER — ROPIVACAINE HCL 2 MG/ML IJ SOLN
2.0000 mL | Freq: Once | INTRAMUSCULAR | Status: AC
  Administered 2018-07-26: 2 mL via EPIDURAL

## 2018-07-26 MED ORDER — LIDOCAINE HCL 2 % IJ SOLN
INTRAMUSCULAR | Status: AC
  Filled 2018-07-26: qty 20

## 2018-07-26 MED ORDER — LACTATED RINGERS IV SOLN
1000.0000 mL | Freq: Once | INTRAVENOUS | Status: AC
  Administered 2018-07-26: 1000 mL via INTRAVENOUS

## 2018-07-26 MED ORDER — IOPAMIDOL (ISOVUE-M 200) INJECTION 41%
10.0000 mL | Freq: Once | INTRAMUSCULAR | Status: AC
  Administered 2018-07-26: 10 mL via EPIDURAL

## 2018-07-26 MED ORDER — FENTANYL CITRATE (PF) 100 MCG/2ML IJ SOLN
25.0000 ug | INTRAMUSCULAR | Status: DC | PRN
  Administered 2018-07-26: 100 ug via INTRAVENOUS

## 2018-07-26 MED ORDER — ROPIVACAINE HCL 2 MG/ML IJ SOLN
INTRAMUSCULAR | Status: AC
  Filled 2018-07-26: qty 10

## 2018-07-26 MED ORDER — IOPAMIDOL (ISOVUE-M 200) INJECTION 41%
INTRAMUSCULAR | Status: AC
  Filled 2018-07-26: qty 10

## 2018-07-26 MED ORDER — DEXAMETHASONE SODIUM PHOSPHATE 10 MG/ML IJ SOLN
INTRAMUSCULAR | Status: AC
  Filled 2018-07-26: qty 1

## 2018-07-26 NOTE — Progress Notes (Signed)
Patient's Name: Sarah [name]  MRN: [id number]  Referring Provider: Hortencia Pilar, MD  DOB: Mar 21, 1932  PCP: Hortencia Pilar, MD  DOS: 07/26/2018  Note by: Gillis Santa, MD  Service setting: Ambulatory outpatient  Specialty: Interventional Pain Management  Patient type: Established  Location: [facility] (AMB) Pain Management Facility  Visit type: Interventional Procedure   Primary Reason for Visit: Interventional Pain Management Treatment. CC: Back Pain (lower)  Procedure:          Anesthesia, Analgesia, Anxiolysis:  Type: Diagnostic Inter-Laminar Epidural Steroid Injection  #1  Region: Lumbar Level: L2-3 Level. Laterality: Left-Sided         Type: Moderate (Conscious) Sedation combined with Local Anesthesia Indication(s): Analgesia and Anxiety Route: Intravenous (IV) IV Access: Secured Sedation: Meaningful verbal contact was maintained at all times during the procedure  Local Anesthetic: Lidocaine 1-2%  Position: Prone with head of the table was raised to facilitate breathing.   Indications: 1. Lumbar radiculopathy   2. Lumbar disc herniation (L2/L3)    Pain Score: Pre-procedure: 10-Worst pain ever/10 Post-procedure: 0-No pain/10  Pre-op Assessment:  [name] is a 84 y.o. (year old), female patient, seen today for interventional treatment. She  has a past surgical history that includes Abdominal hysterectomy and Breast biopsy (Bilateral). [name] has a current medication list which includes the following prescription(s): apoaequorin, aspirin ec, beta carotene w/minerals, dicyclomine, gabapentin, hydroxyzine, lovastatin, lumigan, melatonin, montelukast, phenazopyridine, pregabalin, restasis, sertraline, simvastatin, and synthroid, and the following Facility-Administered Medications: fentanyl and lactated ringers. Her primarily concern today is the Back Pain (lower)  Initial Vital Signs:  Pulse/HCG Rate: 66ECG Heart Rate: 66 Temp: 97.8 F (36.6 C) Resp: 18 BP: (!)  166/94 SpO2: 100 %  BMI: Estimated body mass index is 24.63 kg/m as calculated from the following:   Height as of this encounter: 5\' 8"  (1.727 m).   Weight as of this encounter: 162 lb (73.5 kg).  Risk Assessment: Allergies: Reviewed. She is allergic to codeine; hydrocodone; peanuts [peanut oil]; penicillins; tramadol; and vitamin d analogs.  Allergy Precautions: None required Coagulopathies: Reviewed. None identified.  Blood-thinner therapy: None at this time Active Infection(s): Reviewed. None identified. [name] is afebrile  Site Confirmation: [name] was asked to confirm the procedure and laterality before marking the site Procedure checklist: Completed Consent: Before the procedure and under the influence of no sedative(s), amnesic(s), or anxiolytics, the patient was informed of the treatment options, risks and possible complications. To fulfill our ethical and legal obligations, as recommended by the American Medical Association's Code of Ethics, I have informed the patient of my clinical impression; the nature and purpose of the treatment or procedure; the risks, benefits, and possible complications of the intervention; the alternatives, including doing nothing; the risk(s) and benefit(s) of the alternative treatment(s) or procedure(s); and the risk(s) and benefit(s) of doing nothing. The patient was provided information about the general risks and possible complications associated with the procedure. These may include, but are not limited to: failure to achieve desired goals, infection, bleeding, organ or nerve damage, allergic reactions, paralysis, and death. In addition, the patient was informed of those risks and complications associated to Spine-related procedures, such as failure to decrease pain; infection (i.e.: Meningitis, epidural or intraspinal abscess); bleeding (i.e.: epidural hematoma, subarachnoid hemorrhage, or any other type of intraspinal or peri-dural bleeding);  organ or nerve damage (i.e.: Any type of peripheral nerve, nerve root, or spinal cord injury) with subsequent damage to sensory, motor, and/or autonomic systems, resulting in permanent pain, numbness,  and/or weakness of one or several areas of the body; allergic reactions; (i.e.: anaphylactic reaction); and/or death. Furthermore, the patient was informed of those risks and complications associated with the medications. These include, but are not limited to: allergic reactions (i.e.: anaphylactic or anaphylactoid reaction(s)); adrenal axis suppression; blood sugar elevation that in diabetics may result in ketoacidosis or comma; water retention that in patients with history of congestive heart failure may result in shortness of breath, pulmonary edema, and decompensation with resultant heart failure; weight gain; swelling or edema; medication-induced neural toxicity; particulate matter embolism and blood vessel occlusion with resultant organ, and/or nervous system infarction; and/or aseptic necrosis of one or more joints. Finally, the patient was informed that Medicine is not an exact science; therefore, there is also the possibility of unforeseen or unpredictable risks and/or possible complications that may result in a catastrophic outcome. The patient indicated having understood very clearly. We have given the patient no guarantees and we have made no promises. Enough time was given to the patient to ask questions, all of which were answered to the patient's satisfaction. [name] has indicated that she wanted to continue with the procedure. Attestation: I, the ordering provider, attest that I have discussed with the patient the benefits, risks, side-effects, alternatives, likelihood of achieving goals, and potential problems during recovery for the procedure that I have provided informed consent. Date  Time: 07/26/2018  9:19 AM  Pre-Procedure Preparation:  Monitoring: As per clinic protocol. Respiration,  ETCO2, SpO2, BP, heart rate and rhythm monitor placed and checked for adequate function Safety Precautions: Patient was assessed for positional comfort and pressure points before starting the procedure. Time-out: I initiated and conducted the "Time-out" before starting the procedure, as per protocol. The patient was asked to participate by confirming the accuracy of the "Time Out" information. Verification of the correct person, site, and procedure were performed and confirmed by me, the nursing staff, and the patient. "Time-out" conducted as per Joint Commission's Universal Protocol (UP.01.01.01). Time: 1015  Description of Procedure:          Target Area: The interlaminar space, initially targeting the lower laminar border of the superior vertebral body. Approach: Paramedial approach. Area Prepped: Entire Posterior Lumbar Region Prepping solution: ChloraPrep (2% chlorhexidine gluconate and 70% isopropyl alcohol) Safety Precautions: Aspiration looking for blood return was conducted prior to all injections. At no point did we inject any substances, as a needle was being advanced. No attempts were made at seeking any paresthesias. Safe injection practices and needle disposal techniques used. Medications properly checked for expiration dates. SDV (single dose vial) medications used. Description of the Procedure: Protocol guidelines were followed. The procedure needle was introduced through the skin, ipsilateral to the reported pain, and advanced to the target area. Bone was contacted and the needle walked caudad, until the lamina was cleared. The epidural space was identified using "loss-of-resistance technique" with 2-3 ml of PF-NaCl (0.9% NSS), in a 5cc LOR glass syringe.  Vitals:   07/26/18 1020 07/26/18 1025 07/26/18 1033 07/26/18 1043  BP: (!) 160/85 (!) 162/86 (!) 114/95 (!) 157/82  Pulse: 64 66    Resp: 12 12 16 15   Temp:   97.9 F (36.6 C)   TempSrc:      SpO2: 98% 98% 99% 98%  Weight:       Height:        Start Time: 1015 hrs. End Time: 1022 hrs.  Materials:  Needle(s) Type: Epidural needle Gauge: 17G Length: 3.5-in Medication(s): Please see orders  for medications and dosing details. 8 CC solution made of 5 cc of preservative-free saline, 2 cc of 0.2% ropivacaine, 1 cc of Decadron 10 mg/cc. Imaging Guidance (Spinal):          Type of Imaging Technique: Fluoroscopy Guidance (Spinal) Indication(s): Assistance in needle guidance and placement for procedures requiring needle placement in or near specific anatomical locations not easily accessible without such assistance. Exposure Time: Please see nurses notes. Contrast: Before injecting any contrast, we confirmed that the patient did not have an allergy to iodine, shellfish, or radiological contrast. Once satisfactory needle placement was completed at the desired level, radiological contrast was injected. Contrast injected under live fluoroscopy. No contrast complications. See chart for type and volume of contrast used. Fluoroscopic Guidance: I was personally present during the use of fluoroscopy. "Tunnel Vision Technique" used to obtain the best possible view of the target area. Parallax error corrected before commencing the procedure. "Direction-depth-direction" technique used to introduce the needle under continuous pulsed fluoroscopy. Once target was reached, antero-posterior, oblique, and lateral fluoroscopic projection used confirm needle placement in all planes. Images permanently stored in EMR. Interpretation: I personally interpreted the imaging intraoperatively. Adequate needle placement confirmed in multiple planes. Appropriate spread of contrast into desired area was observed. No evidence of afferent or efferent intravascular uptake. No intrathecal or subarachnoid spread observed. Permanent images saved into the patient's record.  Antibiotic Prophylaxis:   Anti-infectives (From admission, onward)   None      Indication(s): None identified  Post-operative Assessment:  Post-procedure Vital Signs:  Pulse/HCG Rate: 66(!) 59 Temp: 97.9 F (36.6 C) Resp: 15 BP: (!) 157/82 SpO2: 98 %  EBL: None  Complications: No immediate post-treatment complications observed by team, or reported by patient.  Note: The patient tolerated the entire procedure well. A repeat set of vitals were taken after the procedure and the patient was kept under observation following institutional policy, for this type of procedure. Post-procedural neurological assessment was performed, showing return to baseline, prior to discharge. The patient was provided with post-procedure discharge instructions, including a section on how to identify potential problems. Should any problems arise concerning this procedure, the patient was given instructions to immediately contact us, at any time, without hesitation. In any case, we plan to contact the patient by telephone for a follow-up status report regarding this interventional procedure.  Comments:  No additional relevant information. 5 out of 5 strength bilateral lower extremity: Plantar flexion, dorsiflexion, knee flexion, knee extension.  Plan of Care   Imaging Orders     DG C-Arm 1-60 Min-No Report Procedure Orders    No procedure(s) ordered today    Medications ordered for procedure: Meds ordered this encounter  Medications  . lactated ringers infusion 1,000 mL  . fentaNYL (SUBLIMAZE) injection 25-100 mcg    Make sure Narcan is available in the pyxis when using this medication. In the event of respiratory depression (RR< 8/min): Titrate NARCAN (naloxone) in increments of 0.1 to 0.2 mg IV at 2-3 minute intervals, until desired degree of reversal.  . iopamidol (ISOVUE-M) 41 % intrathecal injection 10 mL  . ropivacaine (PF) 2 mg/mL (0.2%) (NAROPIN) injection 2 mL  . sodium chloride flush (NS) 0.9 % injection 2 mL  . lidocaine (XYLOCAINE) 2 % (with pres) injection 200 mg   . dexamethasone (DECADRON) injection 10 mg   Medications administered: We administered lactated ringers, fentaNYL, iopamidol, ropivacaine (PF) 2 mg/mL (0.2%), sodium chloride flush, lidocaine, and dexamethasone.  See the medical record for exact dosing, route,  and time of administration.  Disposition: Discharge home  Discharge Date & Time: 07/26/2018;   hrs.   Physician-requested Follow-up: Return in about 4 weeks (around 08/23/2018) for Post Procedure Evaluation.  Future Appointments  Date Time Provider [place]  08/24/2018 10:30 AM Gillis Santa, MD [facility] None   Primary Care Physician: Hortencia Pilar, MD Location: [facility] Note by: Gillis Santa, MD Date: 07/26/2018; Time: 10:52 AM  Disclaimer:  Medicine is not an exact science. The only guarantee in medicine is that nothing is guaranteed. It is important to note that the decision to proceed with this intervention was based on the information collected from the patient. The Data and conclusions were drawn from the patient's questionnaire, the interview, and the physical examination. Because the information was provided in large part by the patient, it cannot be guaranteed that it has not been purposely or unconsciously manipulated. Every effort has been made to obtain as much relevant data as possible for this evaluation. It is important to note that the conclusions that lead to this procedure are derived in large part from the available data. Always take into account that the treatment will also be dependent on availability of resources and existing treatment guidelines, considered by other Pain Management Practitioners as being common knowledge and practice, at the time of the intervention. For Medico-Legal purposes, it is also important to point out that variation in procedural techniques and pharmacological choices are the acceptable norm. The indications, contraindications, technique, and results  of the above procedure should only be interpreted and judged by a Board-Certified Interventional Pain Specialist with extensive familiarity and expertise in the same exact procedure and technique.

## 2018-07-26 NOTE — Patient Instructions (Signed)

## 2018-07-26 NOTE — Progress Notes (Signed)
Safety precautions to be maintained throughout the outpatient stay will include: orient to surroundings, keep bed in low position, maintain call bell within reach at all times, provide assistance with transfer out of bed and ambulation.  

## 2018-07-27 NOTE — Telephone Encounter (Signed)
No answer. Unable to leave message, no answering machine.

## 2018-08-24 ENCOUNTER — Ambulatory Visit: Payer: Medicare Other | Admitting: Student in an Organized Health Care Education/Training Program

## 2018-08-31 ENCOUNTER — Encounter: Payer: Self-pay | Admitting: Student in an Organized Health Care Education/Training Program

## 2018-08-31 ENCOUNTER — Ambulatory Visit
Payer: Medicare Other | Attending: Student in an Organized Health Care Education/Training Program | Admitting: Student in an Organized Health Care Education/Training Program

## 2018-08-31 ENCOUNTER — Other Ambulatory Visit: Payer: Self-pay

## 2018-08-31 VITALS — BP 129/89 | HR 77 | Temp 98.0°F | Resp 16 | Ht 68.0 in | Wt 160.0 lb

## 2018-08-31 DIAGNOSIS — Z880 Allergy status to penicillin: Secondary | ICD-10-CM | POA: Diagnosis not present

## 2018-08-31 DIAGNOSIS — Z79899 Other long term (current) drug therapy: Secondary | ICD-10-CM | POA: Insufficient documentation

## 2018-08-31 DIAGNOSIS — Z888 Allergy status to other drugs, medicaments and biological substances status: Secondary | ICD-10-CM | POA: Insufficient documentation

## 2018-08-31 DIAGNOSIS — Z7982 Long term (current) use of aspirin: Secondary | ICD-10-CM | POA: Insufficient documentation

## 2018-08-31 DIAGNOSIS — Z885 Allergy status to narcotic agent status: Secondary | ICD-10-CM | POA: Diagnosis not present

## 2018-08-31 DIAGNOSIS — M47816 Spondylosis without myelopathy or radiculopathy, lumbar region: Secondary | ICD-10-CM

## 2018-08-31 DIAGNOSIS — S92591D Other fracture of right lesser toe(s), subsequent encounter for fracture with routine healing: Secondary | ICD-10-CM | POA: Diagnosis not present

## 2018-08-31 DIAGNOSIS — M5416 Radiculopathy, lumbar region: Secondary | ICD-10-CM | POA: Diagnosis not present

## 2018-08-31 DIAGNOSIS — Z803 Family history of malignant neoplasm of breast: Secondary | ICD-10-CM | POA: Diagnosis not present

## 2018-08-31 DIAGNOSIS — Z79890 Hormone replacement therapy (postmenopausal): Secondary | ICD-10-CM | POA: Diagnosis not present

## 2018-08-31 DIAGNOSIS — G629 Polyneuropathy, unspecified: Secondary | ICD-10-CM | POA: Insufficient documentation

## 2018-08-31 DIAGNOSIS — Z859 Personal history of malignant neoplasm, unspecified: Secondary | ICD-10-CM | POA: Diagnosis not present

## 2018-08-31 DIAGNOSIS — M545 Low back pain: Secondary | ICD-10-CM | POA: Diagnosis present

## 2018-08-31 DIAGNOSIS — M5116 Intervertebral disc disorders with radiculopathy, lumbar region: Secondary | ICD-10-CM | POA: Insufficient documentation

## 2018-08-31 DIAGNOSIS — M5126 Other intervertebral disc displacement, lumbar region: Secondary | ICD-10-CM

## 2018-08-31 DIAGNOSIS — G894 Chronic pain syndrome: Secondary | ICD-10-CM | POA: Diagnosis not present

## 2018-08-31 DIAGNOSIS — X58XXXD Exposure to other specified factors, subsequent encounter: Secondary | ICD-10-CM | POA: Insufficient documentation

## 2018-08-31 NOTE — Patient Instructions (Signed)

## 2018-08-31 NOTE — Progress Notes (Signed)
Patient's Name: Sarah [name]  MRN: [id number]  Referring Provider: Hortencia Pilar, MD  DOB: Jul 11, 1932  PCP: Hortencia Pilar, MD  DOS: 08/31/2018  Note by: Gillis Santa, MD  Service setting: Ambulatory outpatient  Specialty: Interventional Pain Management  Location: [facility] (AMB) Pain Management Facility    Patient type: Established   Primary Reason(s) for Visit: Encounter for post-procedure evaluation of chronic illness with mild to moderate exacerbation CC: Back Pain (low)  HPI  Sarah [name] is a 84 y.o. year old, female patient, who comes today for a post-procedure evaluation. She has Closed fracture of fifth toe of right foot; Lumbar facet arthropathy; Lumbar radiculopathy; Lumbar disc herniation (L2/L3); Lumbar degenerative disc disease; Chronic pain syndrome; and Neuropathy on their problem list. Her primarily concern today is the Back Pain (low)  Pain Assessment: Location: Lower Back Radiating: denies Onset: More than a month ago Duration: Chronic pain Quality: Aching, Discomfort Severity: 6 /10 (subjective, self-reported pain score)  Note: Reported level is inconsistent with clinical observations. Clinically the patient looks like a 3/10 A 3/10 is viewed as "Moderate" and described as significantly interfering with activities of daily living (ADL). It becomes difficult to feed, bathe, get dressed, get on and off the toilet or to perform personal hygiene functions. Difficult to get in and out of bed or a chair without assistance. Very distracting. With effort, it can be ignored when deeply involved in activities. Information on the proper use of the pain scale provided to the patient today. When using our objective Pain Scale, levels between 6 and 10/10 are said to belong in an emergency room, as it progressively worsens from a 6/10, described as severely limiting, requiring emergency care not usually available at an outpatient pain management facility. At a 6/10 level, communication  becomes difficult and requires great effort. Assistance to reach the emergency department may be required. Facial flushing and profuse sweating along with potentially dangerous increases in heart rate and blood pressure will be evident. Effect on ADL: limits activities Timing: Intermittent Modifying factors: rest BP: 129/89  HR: 77  Sarah [name] comes in today for post-procedure evaluation.  Further details on both, my assessment(s), as well as the proposed treatment plan, please see below.  Post-Procedure Assessment  07/26/2018 Procedure: Left L2-L3 ESI Pre-procedure pain score:  10/10 Post-procedure pain score: 0/10         Influential Factors: BMI: 24.33 kg/m Intra-procedural challenges: None observed.         Assessment challenges: None detected.              Reported side-effects: None.        Post-procedural adverse reactions or complications: None reported         Sedation: Please see nurses note. When no sedatives are used, the analgesic levels obtained are directly associated to the effectiveness of the local anesthetics. However, when sedation is provided, the level of analgesia obtained during the initial 1 hour following the intervention, is believed to be the result of a combination of factors. These factors may include, but are not limited to: 1. The effectiveness of the local anesthetics used. 2. The effects of the analgesic(s) and/or anxiolytic(s) used. 3. The degree of discomfort experienced by the patient at the time of the procedure. 4. The patients ability and reliability in recalling and recording the events. 5. The presence and influence of possible secondary gains and/or psychosocial factors. Reported result: Relief experienced during the 1st hour after the procedure: 100 % (Ultra-Short Term Relief)  Interpretative annotation: Clinically appropriate result. Analgesia during this period is likely to be Local Anesthetic and/or IV Sedative  (Analgesic/Anxiolytic) related.          Effects of local anesthetic: The analgesic effects attained during this period are directly associated to the localized infiltration of local anesthetics and therefore cary significant diagnostic value as to the etiological location, or anatomical origin, of the pain. Expected duration of relief is directly dependent on the pharmacodynamics of the local anesthetic used. Long-acting (4-6 hours) anesthetics used.  Reported result: Relief during the next 4 to 6 hour after the procedure: 100 % (Short-Term Relief)            Interpretative annotation: Clinically appropriate result. Analgesia during this period is likely to be Local Anesthetic-related.          Long-term benefit: Defined as the period of time past the expected duration of local anesthetics (1 hour for short-acting and 4-6 hours for long-acting). With the possible exception of prolonged sympathetic blockade from the local anesthetics, benefits during this period are typically attributed to, or associated with, other factors such as analgesic sensory neuropraxia, antiinflammatory effects, or beneficial biochemical changes provided by agents other than the local anesthetics.  Reported result: Extended relief following procedure: 50 % (Long-Term Relief)            Interpretative annotation: Clinically possible results. Good relief. No permanent benefit expected. Inflammation plays a part in the etiology to the pain.          Current benefits: Defined as reported results that persistent at this point in time.   Analgesia: 50 %            Function: Somewhat improved ROM: Somewhat improved Interpretative annotation: Recurrence of symptoms. No permanent benefit expected. Effective diagnostic intervention.          Interpretation: Results would suggest a successful diagnostic intervention.                  Plan:  Repeat treatment or therapy and compare extent and duration of benefits.                 Laboratory Chemistry  Inflammation Markers (CRP: Acute Phase) (ESR: Chronic Phase) No results found for: CRP, ESRSEDRATE, LATICACIDVEN                       Rheumatology Markers No results found for: RF, ANA, LABURIC, URICUR, LYMEIGGIGMAB, LYMEABIGMQN, HLAB27                      Renal Function Markers Lab Results  Component Value Date   BUN 23 (H) 09/14/2015   CREATININE 0.80 09/14/2015   GFRAA >60 09/14/2015   GFRNONAA >60 09/14/2015                             Hepatic Function Markers Lab Results  Component Value Date   AST 19 11/26/2015   ALT 15 11/26/2015   ALBUMIN 4.7 11/26/2015   ALKPHOS 64 11/26/2015                        Electrolytes Lab Results  Component Value Date   NA 141 09/14/2015   K 3.5 09/14/2015   CL 107 09/14/2015   CALCIUM 9.7 09/14/2015   MG 2.2 09/14/2015  Neuropathy Markers No results found for: VITAMINB12, FOLATE, HGBA1C, HIV                      CNS Tests No results found for: COLORCSF, APPEARCSF, RBCCOUNTCSF, WBCCSF, POLYSCSF, LYMPHSCSF, EOSCSF, PROTEINCSF, GLUCCSF, JCVIRUS, CSFOLI, IGGCSF                      Bone Pathology Markers No results found for: VD25OH, [id number], [id number], VO1607PX1, 25OHVITD1, 25OHVITD2, 25OHVITD3, TESTOFREE, TESTOSTERONE                       Coagulation Parameters Lab Results  Component Value Date   INR 1.08 09/14/2015   LABPROT 14.2 09/14/2015   APTT 31 09/14/2015   PLT 177 09/14/2015                        Cardiovascular Markers Lab Results  Component Value Date   CKTOTAL 66 02/12/2013   CKMB < 0.5 (L) 02/12/2013   TROPONINI <0.03 09/14/2015   HGB 13.2 09/14/2015   HCT 38.3 09/14/2015                         CA Markers No results found for: CEA, CA125, LABCA2                      Note: Lab results reviewed.  Recent Diagnostic Imaging Results  DG C-Arm 1-60 Min-No Report Fluoroscopy was utilized by the requesting physician.  No radiographic  interpretation.    Complexity Note: Imaging results reviewed. Results shared with Sarah [name], using Layman's terms.                         Meds   Current Outpatient Medications:  .  Apoaequorin (PREVAGEN PO), Take by mouth once., Disp: , Rfl:  .  aspirin EC 81 MG tablet, Take 1 tablet by mouth daily., Disp: , Rfl:  .  beta carotene w/minerals (OCUVITE) tablet, Take 1 tablet by mouth daily., Disp: , Rfl:  .  dicyclomine (BENTYL) 10 MG capsule, Take 10 mg by mouth 4 (four) times daily -  before meals and at bedtime., Disp: , Rfl:  .  gabapentin (NEURONTIN) 100 MG capsule, Take 1 capsule by mouth at bedtime., Disp: , Rfl:  .  hydrOXYzine (ATARAX/VISTARIL) 10 MG tablet, Take 1 tablet by mouth as needed., Disp: , Rfl:  .  levothyroxine (SYNTHROID, LEVOTHROID) 100 MCG tablet, TAKE 1 TABLET BY MOUTH ONCE DAILY ON EMPTY STOMACH WITH GLAS OF WATER 30-60MIN BEFORE BREAKFAST, Disp: , Rfl: 11 .  lovastatin (MEVACOR) 20 MG tablet, Take 20 mg by mouth at bedtime., Disp: , Rfl:  .  LUMIGAN 0.01 % SOLN, Place 1 drop into the left eye at bedtime., Disp: , Rfl:  .  Melatonin 1 MG TABS, Take 10 mg by mouth once., Disp: , Rfl:  .  montelukast (SINGULAIR) 10 MG tablet, Take 10 mg by mouth at bedtime., Disp: , Rfl:  .  phenazopyridine (PYRIDIUM) 97 MG tablet, Take 97 mg by mouth once., Disp: , Rfl:  .  pregabalin (LYRICA) 100 MG capsule, Take 100 mg by mouth 3 (three) times daily., Disp: , Rfl:  .  RESTASIS 0.05 % ophthalmic emulsion, Apply 1 drop to eye 2 (two) times daily., Disp: , Rfl:  .  sertraline (ZOLOFT) 50 MG tablet, Take 1 tablet by  mouth daily., Disp: , Rfl:  .  simvastatin (ZOCOR) 10 MG tablet, Take 10 mg by mouth at bedtime., Disp: , Rfl:  .  SYNTHROID 125 MCG tablet, Take 1 tablet by mouth daily., Disp: , Rfl:   ROS  Constitutional: Denies any fever or chills Gastrointestinal: No reported hemesis, hematochezia, vomiting, or acute GI distress Musculoskeletal: Denies any acute onset joint swelling, redness, loss  of ROM, or weakness Neurological: No reported episodes of acute onset apraxia, aphasia, dysarthria, agnosia, amnesia, paralysis, loss of coordination, or loss of consciousness  Allergies  Sarah [name] is allergic to codeine; hydrocodone; peanuts [peanut oil]; penicillins; tramadol; and vitamin d analogs.  [place]  Drug: Sarah [name]  has no drug history on file. Alcohol:  reports that she does not drink alcohol. Tobacco:  reports that she has never smoked. She has never used smokeless tobacco. Medical:  has a past medical history of Cancer ([place]), Seizures ([place]), and Thyroid disease. Surgical: Sarah [name]  has a past surgical history that includes Abdominal hysterectomy and Breast biopsy (Bilateral). Family: family history includes Breast cancer in her maternal aunt; Breast cancer (age of onset: 45) in her maternal aunt; Breast cancer (age of onset: 54) in her mother; Breast cancer (age of onset: 90) in her maternal uncle.  Constitutional Exam  General appearance: Well nourished, well developed, and well hydrated. In no apparent acute distress Vitals:   08/31/18 1021  BP: 129/89  Pulse: 77  Resp: 16  Temp: 98 F (36.7 C)  SpO2: 95%  Weight: 160 lb (72.6 kg)  Height: 5' 8"  (1.727 m)   BMI Assessment: Estimated body mass index is 24.33 kg/m as calculated from the following:   Height as of this encounter: 5' 8"  (1.727 m).   Weight as of this encounter: 160 lb (72.6 kg).  BMI interpretation table: BMI level Category Range association with higher incidence of chronic pain  <18 kg/m2 Underweight   18.5-24.9 kg/m2 Ideal body weight   25-29.9 kg/m2 Overweight Increased incidence by 20%  30-34.9 kg/m2 Obese (Class I) Increased incidence by 68%  35-39.9 kg/m2 Severe obesity (Class II) Increased incidence by 136%  >40 kg/m2 Extreme obesity (Class III) Increased incidence by 254%   Patient's current BMI Ideal Body weight  Body mass index is 24.33 kg/m. Ideal body weight: 63.9 kg (140 lb 14  oz) Adjusted ideal body weight: 67.4 kg (148 lb 8.4 oz)   BMI Readings from Last 4 Encounters:  08/31/18 24.33 kg/m  07/26/18 24.63 kg/m  07/22/18 24.63 kg/m  04/17/17 22.50 kg/m   Wt Readings from Last 4 Encounters:  08/31/18 160 lb (72.6 kg)  07/26/18 162 lb (73.5 kg)  07/22/18 162 lb (73.5 kg)  04/17/17 148 lb (67.1 kg)  Psych/Mental status: Alert, oriented x 3 (person, place, & time)       Eyes: PERLA Respiratory: No evidence of acute respiratory distress  Cervical Spine Area Exam  Skin & Axial Inspection: No masses, redness, edema, swelling, or associated skin lesions Alignment: Symmetrical Functional ROM: Unrestricted ROM      Stability: No instability detected Muscle Tone/Strength: Functionally intact. No obvious neuro-muscular anomalies detected. Sensory (Neurological): Unimpaired Palpation: No palpable anomalies              Upper Extremity (UE) Exam    Side: Right upper extremity  Side: Left upper extremity  Skin & Extremity Inspection: Skin color, temperature, and hair growth are WNL. No peripheral edema or cyanosis. No masses, redness, swelling, asymmetry, or associated skin lesions.  No contractures.  Skin & Extremity Inspection: Skin color, temperature, and hair growth are WNL. No peripheral edema or cyanosis. No masses, redness, swelling, asymmetry, or associated skin lesions. No contractures.  Functional ROM: Unrestricted ROM          Functional ROM: Unrestricted ROM          Muscle Tone/Strength: Functionally intact. No obvious neuro-muscular anomalies detected.  Muscle Tone/Strength: Functionally intact. No obvious neuro-muscular anomalies detected.  Sensory (Neurological): Unimpaired          Sensory (Neurological): Unimpaired          Palpation: No palpable anomalies              Palpation: No palpable anomalies              Provocative Test(s):  Phalen's test: deferred Tinel's test: deferred Apley's scratch test (touch opposite shoulder):  Action 1  (Across chest): deferred Action 2 (Overhead): deferred Action 3 (LB reach): deferred   Provocative Test(s):  Phalen's test: deferred Tinel's test: deferred Apley's scratch test (touch opposite shoulder):  Action 1 (Across chest): deferred Action 2 (Overhead): deferred Action 3 (LB reach): deferred    Thoracic Spine Area Exam  Skin & Axial Inspection: No masses, redness, or swelling Alignment: Symmetrical Functional ROM: Unrestricted ROM Stability: No instability detected Muscle Tone/Strength: Functionally intact. No obvious neuro-muscular anomalies detected. Sensory (Neurological): Unimpaired Muscle strength & Tone: No palpable anomalies  Lumbar Spine Area Exam  Skin & Axial Inspection: No masses, redness, or swelling Alignment: Symmetrical Functional ROM: Decreased ROM       Stability: No instability detected Muscle Tone/Strength: Functionally intact. No obvious neuro-muscular anomalies detected. Sensory (Neurological): Dermatomal pain pattern left Palpation: No palpable anomalies       Provocative Tests: Hyperextension/rotation test: deferred today       Lumbar quadrant test (Kemp's test): (+) on the left for foraminal stenosis Lateral bending test: (+) due to pain. Patrick's Maneuver: deferred today                   FABER* test: deferred today                   S-I anterior distraction/compression test: deferred today         S-I lateral compression test: deferred today         S-I Thigh-thrust test: deferred today         S-I Gaenslen's test: deferred today         *(Flexion, ABduction and External Rotation)  Gait & Posture Assessment  Ambulation: Limited Gait: Antalgic Posture: Difficulty standing up straight, due to pain   Lower Extremity Exam    Side: Right lower extremity  Side: Left lower extremity  Stability: No instability observed          Stability: No instability observed          Skin & Extremity Inspection: Skin color, temperature, and hair growth are  WNL. No peripheral edema or cyanosis. No masses, redness, swelling, asymmetry, or associated skin lesions. No contractures.  Skin & Extremity Inspection: Skin color, temperature, and hair growth are WNL. No peripheral edema or cyanosis. No masses, redness, swelling, asymmetry, or associated skin lesions. No contractures.  Functional ROM: Unrestricted ROM                  Functional ROM: Pain restricted ROM for hip joint          Muscle Tone/Strength: Functionally intact. No obvious  neuro-muscular anomalies detected.  Muscle Tone/Strength: Functionally intact. No obvious neuro-muscular anomalies detected.  Sensory (Neurological): Unimpaired        Sensory (Neurological): Dermatomal pain pattern        DTR: Patellar: deferred today Achilles: deferred today Plantar: deferred today  DTR: Patellar: 1+: trace Achilles: 1+: trace Plantar: deferred today  Palpation: No palpable anomalies  Palpation: No palpable anomalies   Assessment  Primary Diagnosis & Pertinent Problem List: The primary encounter diagnosis was Lumbar radiculopathy. Diagnoses of Lumbar disc herniation (L2/L3), Lumbar facet arthropathy (L5/S1), and Chronic pain syndrome were also pertinent to this visit.  Status Diagnosis  Responding Persistent Persistent 1. Lumbar radiculopathy   2. Lumbar disc herniation (L2/L3)   3. Lumbar facet arthropathy (L5/S1)   4. Chronic pain syndrome      Patient is a pleasant 84 year old female with a chief complaint of axial low back pain as well as bilateral leg pain.  Patient has completed physical therapy.  Patient is also had lumbar facet medial branch nerve blocks which were not beneficial.  She is also had right L5-S1 transforaminal ESI as well as left S1 transforaminal ESI performed in May and June 2019 which only provided short-term benefit.  Patient is not very interested in surgical intervention.  She has tried various medications including anti-inflammatories, Lyrica, Tylenol.  She  wasreferred here for consideration of spinal cord stimulator trial.  Patient does have a lumbar disc herniation at L2 which could be contributing to her symptoms.  Patient does endorse groin anterior thigh pain which could be secondary to L2 and L3 radiculopathy.  Patient's prior lumbar transfemoral epidural steroid injections were at L5-S1 and S1 respectively.  Patient follows up today status post left L2-L3 ESI.  She endorses approximately 100% pain relief in regards to her left radicular symptoms for the first 2 weeks with reduction to approximately 50% currently.  She states that this epidural injection has provided her with the most benefit compared to previous ESI's which were done lower in her lumbar spine.  We discussed repeating left lumbar ESI at L2-L3.  Risks and benefits were discussed and patient would like to proceed.  Plan of Care    Plan: -Repeat left L2-L3 ESI #2  Future considerations: -Patient could be a candidate for SCS. Patient will need thoracic MRI as well as psychological evaluation.  She is not on any blood thinners.  She is not a diabetic.  She does not smoke.   Lab-work, procedure(s), and/or referral(s): Orders Placed This Encounter  Procedures  . Lumbar Epidural Injection   Time Note: Greater than 50% of the 25 minute(s) of face-to-face time spent with Sarah [name], was spent in counseling/coordination of care regarding: Sarah [name] primary cause of pain, the treatment plan, treatment alternatives, the results, interpretation and significance of  her recent diagnostic interventional treatment(s), realistic expectations and the goals of pain management (increased in functionality).  Provider-requested follow-up: Return in about 8 days (around 09/08/2018) for Procedure.  Future Appointments  Date Time Provider [place]  09/08/2018 10:00 AM Gillis Santa, MD [facility] None    Primary Care Physician: Hortencia Pilar, MD Location: [facility] Note by: Gillis Santa, M.D Date: 08/31/2018; Time: 3:07 PM  Patient Instructions  Preparing for your procedure (without sedation) Instructions: . Oral Intake: Do not eat or drink anything for at least 3 hours prior to your procedure. . Transportation: Unless otherwise stated by your physician, you may drive yourself after the procedure. . Blood Pressure  Medicine: Take your blood pressure medicine with a sip of water the morning of the procedure. . Insulin: Take only  of your normal insulin dose. . Preventing infections: Shower with an antibacterial soap the morning of your procedure. . Build-up your immune system: Take 1000 mg of Vitamin C with every meal (3 times a day) the day prior to your procedure. . Pregnancy: If you are pregnant, call and cancel the procedure. . Sickness: If you have a cold, fever, or any active infections, call and cancel the procedure. . Arrival: You must be in the facility at least 30 minutes prior to your scheduled procedure. . Children: Do not bring any children with you. . Dress appropriately: Bring dark clothing that you would not mind if they get stained. . Valuables: Do not bring any jewelry or valuables. Procedure appointments are reserved for interventional treatments only. Mar[place] Kitchen No Prescription Refills. . No medication changes will be discussed during procedure appointments. . No disability issues will be discussed.

## 2018-08-31 NOTE — Progress Notes (Signed)
Safety precautions to be maintained throughout the outpatient stay will include: orient to surroundings, keep bed in low position, maintain call bell within reach at all times, provide assistance with transfer out of bed and ambulation.  

## 2018-09-08 ENCOUNTER — Encounter: Payer: Self-pay | Admitting: Student in an Organized Health Care Education/Training Program

## 2018-09-08 ENCOUNTER — Ambulatory Visit (HOSPITAL_BASED_OUTPATIENT_CLINIC_OR_DEPARTMENT_OTHER): Payer: Medicare Other | Admitting: Student in an Organized Health Care Education/Training Program

## 2018-09-08 ENCOUNTER — Ambulatory Visit
Admission: RE | Admit: 2018-09-08 | Discharge: 2018-09-08 | Disposition: A | Discharge status: Home / Self Care | Payer: Medicare Other | Source: Ambulatory Visit | Attending: Student in an Organized Health Care Education/Training Program | Admitting: Student in an Organized Health Care Education/Training Program

## 2018-09-08 ENCOUNTER — Other Ambulatory Visit: Payer: Self-pay

## 2018-09-08 VITALS — BP 123/95 | HR 84 | Temp 97.7°F | Resp 14 | Ht 68.0 in | Wt 160.0 lb

## 2018-09-08 DIAGNOSIS — M5416 Radiculopathy, lumbar region: Secondary | ICD-10-CM | POA: Insufficient documentation

## 2018-09-08 MED ORDER — DEXAMETHASONE SODIUM PHOSPHATE 10 MG/ML IJ SOLN
10.0000 mg | Freq: Once | INTRAMUSCULAR | Status: AC
  Administered 2018-09-08: 10 mg
  Filled 2018-09-08: qty 1

## 2018-09-08 MED ORDER — SODIUM CHLORIDE (PF) 0.9 % IJ SOLN
INTRAMUSCULAR | Status: AC
  Filled 2018-09-08: qty 10

## 2018-09-08 MED ORDER — SODIUM CHLORIDE 0.9% FLUSH
2.0000 mL | Freq: Once | INTRAVENOUS | Status: AC
  Administered 2018-09-08: 10 mL

## 2018-09-08 MED ORDER — IOPAMIDOL (ISOVUE-M 200) INJECTION 41%
10.0000 mL | Freq: Once | INTRAMUSCULAR | Status: AC
  Administered 2018-09-08: 10 mL via EPIDURAL
  Filled 2018-09-08: qty 10

## 2018-09-08 MED ORDER — ROPIVACAINE HCL 2 MG/ML IJ SOLN
2.0000 mL | Freq: Once | INTRAMUSCULAR | Status: AC
  Administered 2018-09-08: 10 mL via EPIDURAL
  Filled 2018-09-08: qty 10

## 2018-09-08 MED ORDER — LIDOCAINE HCL 2 % IJ SOLN
10.0000 mL | Freq: Once | INTRAMUSCULAR | Status: AC
  Administered 2018-09-08: 400 mg
  Filled 2018-09-08: qty 20

## 2018-09-08 NOTE — Progress Notes (Signed)
Safety precautions to be maintained throughout the outpatient stay will include: orient to surroundings, keep bed in low position, maintain call bell within reach at all times, provide assistance with transfer out of bed and ambulation.  

## 2018-09-08 NOTE — Patient Instructions (Signed)

## 2018-09-08 NOTE — Progress Notes (Signed)
Patient's Name: Sarah [name]  MRN: [id number]  Referring Provider: Hortencia Pilar, MD  DOB: [birth year]  PCP: Sarah Pilar, MD  DOS: 09/08/2018  Note by: Gillis Santa, MD  Service setting: Ambulatory outpatient  Specialty: Interventional Pain Management  Patient type: Established  Location: [facility] (AMB) Pain Management Facility  Visit type: Interventional Procedure   Primary Reason for Visit: Interventional Pain Management Treatment. CC: Back Pain (low)  Procedure:          Anesthesia, Analgesia, Anxiolysis:  Type: Diagnostic Inter-Laminar Epidural Steroid Injection  #2  Region: Lumbar Level: L2-3 Level. Laterality: Midline         Type: Local Anesthesia Indication(s): Analgesia         Route: Infiltration (Girard/IM) IV Access: Declined Sedation: Declined  Local Anesthetic: Lidocaine 1-2%  Position: Prone with head of the table was raised to facilitate breathing.   Indications: 1. Lumbar radiculopathy    Pain Score: Pre-procedure: 3 /10 Post-procedure: 0-No pain/10  Pre-op Assessment:  Sarah [name] is a 84 y.o. (year old), female patient, seen today for interventional treatment. She  has a past surgical history that includes Abdominal hysterectomy and Breast biopsy (Bilateral). Sarah [name] has a current medication list which includes the following prescription(s): apoaequorin, beta carotene w/minerals, hydroxyzine, levothyroxine, lovastatin, lumigan, melatonin, montelukast, phenazopyridine, pregabalin, sertraline, simvastatin, aspirin ec, dicyclomine, gabapentin, restasis, and synthroid. Her primarily concern today is the Back Pain (low)  Initial Vital Signs:  Pulse/HCG Rate: 84ECG Heart Rate: 70 Temp: 97.7 F (36.5 C) Resp: 16 BP: (!) 128/91 SpO2: 100 %  BMI: Estimated body mass index is 24.33 kg/m as calculated from the following:   Height as of this encounter: 5\' 8"  (1.727 m).   Weight as of this encounter: 160 lb (72.6 kg).  Risk Assessment: Allergies: Reviewed.  She is allergic to codeine; hydrocodone; peanuts [peanut oil]; penicillins; tramadol; and vitamin d analogs.  Allergy Precautions: None required Coagulopathies: Reviewed. None identified.  Blood-thinner therapy: None at this time Active Infection(s): Reviewed. None identified. Sarah [name] is afebrile  Site Confirmation: Sarah [name] was asked to confirm the procedure and laterality before marking the site Procedure checklist: Completed Consent: Before the procedure and under the influence of no sedative(s), amnesic(s), or anxiolytics, the patient was informed of the treatment options, risks and possible complications. To fulfill our ethical and legal obligations, as recommended by the American Medical Association's Code of Ethics, I have informed the patient of my clinical impression; the nature and purpose of the treatment or procedure; the risks, benefits, and possible complications of the intervention; the alternatives, including doing nothing; the risk(s) and benefit(s) of the alternative treatment(s) or procedure(s); and the risk(s) and benefit(s) of doing nothing. The patient was provided information about the general risks and possible complications associated with the procedure. These may include, but are not limited to: failure to achieve desired goals, infection, bleeding, organ or nerve damage, allergic reactions, paralysis, and death. In addition, the patient was informed of those risks and complications associated to Spine-related procedures, such as failure to decrease pain; infection (i.e.: Meningitis, epidural or intraspinal abscess); bleeding (i.e.: epidural hematoma, subarachnoid hemorrhage, or any other type of intraspinal or peri-dural bleeding); organ or nerve damage (i.e.: Any type of peripheral nerve, nerve root, or spinal cord injury) with subsequent damage to sensory, motor, and/or autonomic systems, resulting in permanent pain, numbness, and/or weakness of one or several areas of the  body; allergic reactions; (i.e.: anaphylactic reaction); and/or death. Furthermore, the patient was informed of those risks  and complications associated with the medications. These include, but are not limited to: allergic reactions (i.e.: anaphylactic or anaphylactoid reaction(s)); adrenal axis suppression; blood sugar elevation that in diabetics may result in ketoacidosis or comma; water retention that in patients with history of congestive heart failure may result in shortness of breath, pulmonary edema, and decompensation with resultant heart failure; weight gain; swelling or edema; medication-induced neural toxicity; particulate matter embolism and blood vessel occlusion with resultant organ, and/or nervous system infarction; and/or aseptic necrosis of one or more joints. Finally, the patient was informed that Medicine is not an exact science; therefore, there is also the possibility of unforeseen or unpredictable risks and/or possible complications that may result in a catastrophic outcome. The patient indicated having understood very clearly. We have given the patient no guarantees and we have made no promises. Enough time was given to the patient to ask questions, all of which were answered to the patient's satisfaction. Sarah [name] has indicated that she wanted to continue with the procedure. Attestation: I, the ordering provider, attest that I have discussed with the patient the benefits, risks, side-effects, alternatives, likelihood of achieving goals, and potential problems during recovery for the procedure that I have provided informed consent. Date  Time: 09/08/2018  9:37 AM  Pre-Procedure Preparation:  Monitoring: As per clinic protocol. Respiration, ETCO2, SpO2, BP, heart rate and rhythm monitor placed and checked for adequate function Safety Precautions: Patient was assessed for positional comfort and pressure points before starting the procedure. Time-out: I initiated and conducted the  "Time-out" before starting the procedure, as per protocol. The patient was asked to participate by confirming the accuracy of the "Time Out" information. Verification of the correct person, site, and procedure were performed and confirmed by me, the nursing staff, and the patient. "Time-out" conducted as per Joint Commission's Universal Protocol (UP.01.01.01). Time: 1106  Description of Procedure:          Target Area: The interlaminar space, initially targeting the lower laminar border of the superior vertebral body. Approach: Paramedial approach. Area Prepped: Entire Posterior Lumbar Region Prepping solution: ChloraPrep (2% chlorhexidine gluconate and 70% isopropyl alcohol) Safety Precautions: Aspiration looking for blood return was conducted prior to all injections. At no point did we inject any substances, as a needle was being advanced. No attempts were made at seeking any paresthesias. Safe injection practices and needle disposal techniques used. Medications properly checked for expiration dates. SDV (single dose vial) medications used. Description of the Procedure: Protocol guidelines were followed. The procedure needle was introduced through the skin, ipsilateral to the reported pain, and advanced to the target area. Bone was contacted and the needle walked caudad, until the lamina was cleared. The epidural space was identified using "loss-of-resistance technique" with 2-3 ml of PF-NaCl (0.9% NSS), in a 5cc LOR glass syringe.  Vitals:   09/08/18 0958 09/08/18 1109 09/08/18 1115  BP: (!) 128/91 (!) 147/88 (!) 123/95  Pulse: 84    Resp: 16 12 14   Temp: 97.7 F (36.5 C)    SpO2: 100% 97% 98%  Weight: 160 lb (72.6 kg)    Height: 5\' 8"  (1.727 m)      Start Time: 1106 hrs. End Time: 1111 hrs.  Materials:  Needle(s) Type: Epidural needle Gauge: 17G Length: 3.5-in Medication(s): Please see orders for medications and dosing details. 9 CC solution made of 6 cc of preservative-free  saline, 2 cc of 0.2% ropivacaine, 1 cc of Decadron 10 mg/cc. Imaging Guidance (Spinal):  Type of Imaging Technique: Fluoroscopy Guidance (Spinal) Indication(s): Assistance in needle guidance and placement for procedures requiring needle placement in or near specific anatomical locations not easily accessible without such assistance. Exposure Time: Please see nurses notes. Contrast: Before injecting any contrast, we confirmed that the patient did not have an allergy to iodine, shellfish, or radiological contrast. Once satisfactory needle placement was completed at the desired level, radiological contrast was injected. Contrast injected under live fluoroscopy. No contrast complications. See chart for type and volume of contrast used. Fluoroscopic Guidance: I was personally present during the use of fluoroscopy. "Tunnel Vision Technique" used to obtain the best possible view of the target area. Parallax error corrected before commencing the procedure. "Direction-depth-direction" technique used to introduce the needle under continuous pulsed fluoroscopy. Once target was reached, antero-posterior, oblique, and lateral fluoroscopic projection used confirm needle placement in all planes. Images permanently stored in EMR. Interpretation: I personally interpreted the imaging intraoperatively. Adequate needle placement confirmed in multiple planes. Appropriate spread of contrast into desired area was observed. No evidence of afferent or efferent intravascular uptake. No intrathecal or subarachnoid spread observed. Permanent images saved into the patient's record.  Antibiotic Prophylaxis:   Anti-infectives (From admission, onward)   None     Indication(s): None identified  Post-operative Assessment:  Post-procedure Vital Signs:  Pulse/HCG Rate: 8473 Temp: 97.7 F (36.5 C) Resp: 14 BP: (!) 123/95 SpO2: 98 %  EBL: None  Complications: No immediate post-treatment complications observed by team,  or reported by patient.  Note: The patient tolerated the entire procedure well. A repeat set of vitals were taken after the procedure and the patient was kept under observation following institutional policy, for this type of procedure. Post-procedural neurological assessment was performed, showing return to baseline, prior to discharge. The patient was provided with post-procedure discharge instructions, including a section on how to identify potential problems. Should any problems arise concerning this procedure, the patient was given instructions to immediately contact us, at any time, without hesitation. In any case, we plan to contact the patient by telephone for a follow-up status report regarding this interventional procedure.  Comments:  No additional relevant information. 5 out of 5 strength bilateral lower extremity: Plantar flexion, dorsiflexion, knee flexion, knee extension.  Plan of Care   Imaging Orders     DG C-Arm 1-60 Min-No Report Procedure Orders    No procedure(s) ordered today    Medications ordered for procedure: Meds ordered this encounter  Medications  . iopamidol (ISOVUE-M) 41 % intrathecal injection 10 mL  . ropivacaine (PF) 2 mg/mL (0.2%) (NAROPIN) injection 2 mL  . sodium chloride flush (NS) 0.9 % injection 2 mL  . lidocaine (XYLOCAINE) 2 % (with pres) injection 200 mg  . dexamethasone (DECADRON) injection 10 mg   Medications administered: We administered iopamidol, ropivacaine (PF) 2 mg/mL (0.2%), sodium chloride flush, lidocaine, and dexamethasone.  See the medical record for exact dosing, route, and time of administration.  Disposition: Discharge home  Discharge Date & Time: 09/08/2018; 1120 hrs.   Physician-requested Follow-up: Return in about 5 weeks (around 10/13/2018) for Post Procedure Evaluation.  Future Appointments  Date Time Provider [place]  10/06/2018  1:30 PM Gillis Santa, MD [facility] None   Primary Care Physician: Sarah Pilar, MD Location: [facility] Note by: Gillis Santa, MD Date: 09/08/2018; Time: 3:10 PM  Disclaimer:  Medicine is not an exact science. The only guarantee in medicine is that nothing is guaranteed. It is important to note that  the decision to proceed with this intervention was based on the information collected from the patient. The Data and conclusions were drawn from the patient's questionnaire, the interview, and the physical examination. Because the information was provided in large part by the patient, it cannot be guaranteed that it has not been purposely or unconsciously manipulated. Every effort has been made to obtain as much relevant data as possible for this evaluation. It is important to note that the conclusions that lead to this procedure are derived in large part from the available data. Always take into account that the treatment will also be dependent on availability of resources and existing treatment guidelines, considered by other Pain Management Practitioners as being common knowledge and practice, at the time of the intervention. For Medico-Legal purposes, it is also important to point out that variation in procedural techniques and pharmacological choices are the acceptable norm. The indications, contraindications, technique, and results of the above procedure should only be interpreted and judged by a Board-Certified Interventional Pain Specialist with extensive familiarity and expertise in the same exact procedure and technique.

## 2018-09-09 ENCOUNTER — Telehealth: Payer: Self-pay | Admitting: *Deleted

## 2018-09-09 NOTE — Telephone Encounter (Signed)
Attempted to call for post procedure follow-up. Message left. 

## 2018-10-06 ENCOUNTER — Ambulatory Visit
Payer: Medicare Other | Attending: Student in an Organized Health Care Education/Training Program | Admitting: Student in an Organized Health Care Education/Training Program

## 2018-10-06 ENCOUNTER — Encounter: Payer: Self-pay | Admitting: Student in an Organized Health Care Education/Training Program

## 2018-10-06 VITALS — BP 124/84 | HR 66 | Temp 98.1°F | Resp 16 | Ht 68.0 in | Wt 160.0 lb

## 2018-10-06 DIAGNOSIS — M5126 Other intervertebral disc displacement, lumbar region: Secondary | ICD-10-CM

## 2018-10-06 DIAGNOSIS — M47816 Spondylosis without myelopathy or radiculopathy, lumbar region: Secondary | ICD-10-CM | POA: Diagnosis present

## 2018-10-06 DIAGNOSIS — M5416 Radiculopathy, lumbar region: Secondary | ICD-10-CM | POA: Insufficient documentation

## 2018-10-06 NOTE — Patient Instructions (Signed)
____________________________________________________________________________________________  General Risks and Possible Complications  Patient Responsibilities: It is important that you read this as it is part of your informed consent. It is our duty to inform you of the risks and possible complications associated with treatments offered to you. It is your responsibility as a patient to read this and to ask questions about anything that is not clear or that you believe was not covered in this document.  Patient's Rights: You have the right to refuse treatment. You also have the right to change your mind, even after initially having agreed to have the treatment done. However, under this last option, if you wait until the last second to change your mind, you may be charged for the materials used up to that point.  Introduction: Medicine is not an exact science. Everything in Medicine, including the lack of treatment(s), carries the potential for danger, harm, or loss (which is by definition: Risk). In Medicine, a complication is a secondary problem, condition, or disease that can aggravate an already existing one. All treatments carry the risk of possible complications. The fact that a side effects or complications occurs, does not imply that the treatment was conducted incorrectly. It must be clearly understood that these can happen even when everything is done following the highest safety standards.  No treatment: You can choose not to proceed with the proposed treatment alternative. The "PRO(s)" would include: avoiding the risk of complications associated with the therapy. The "CON(s)" would include: not getting any of the treatment benefits. These benefits fall under one of three categories: diagnostic; therapeutic; and/or palliative. Diagnostic benefits include: getting information which can ultimately lead to improvement of the disease or symptom(s). Therapeutic benefits are those associated with the  successful treatment of the disease. Finally, palliative benefits are those related to the decrease of the primary symptoms, without necessarily curing the condition (example: decreasing the pain from a flare-up of a chronic condition, such as incurable terminal cancer).  General Risks and Complications: These are associated to most interventional treatments. They can occur alone, or in combination. They fall under one of the following six (6) categories: no benefit or worsening of symptoms; bleeding; infection; nerve damage; allergic reactions; and/or death. 1. No benefits or worsening of symptoms: In Medicine there are no guarantees, only probabilities. No healthcare provider can ever guarantee that a medical treatment will work, they can only state the probability that it may. Furthermore, there is always the possibility that the condition may worsen, either directly, or indirectly, as a consequence of the treatment. 2. Bleeding: This is more common if the patient is taking a blood thinner, either prescription or over the counter (example: Goody Powders, Fish oil, Aspirin, Garlic, etc.), or if suffering a condition associated with impaired coagulation (example: Hemophilia, cirrhosis of the liver, low platelet counts, etc.). However, even if you do not have one on these, it can still happen. If you have any of these conditions, or take one of these drugs, make sure to notify your treating physician. 3. Infection: This is more common in patients with a compromised immune system, either due to disease (example: diabetes, cancer, human immunodeficiency virus [HIV], etc.), or due to medications or treatments (example: therapies used to treat cancer and rheumatological diseases). However, even if you do not have one on these, it can still happen. If you have any of these conditions, or take one of these drugs, make sure to notify your treating physician. 4. Nerve Damage: This is more common when the   treatment is  an invasive one, but it can also happen with the use of medications, such as those used in the treatment of cancer. The damage can occur to small secondary nerves, or to large primary ones, such as those in the spinal cord and brain. This damage may be temporary or permanent and it may lead to impairments that can range from temporary numbness to permanent paralysis and/or brain death. 5. Allergic Reactions: Any time a substance or material comes in contact with our body, there is the possibility of an allergic reaction. These can range from a mild skin rash (contact dermatitis) to a severe systemic reaction (anaphylactic reaction), which can result in death. 6. Death: In general, any medical intervention can result in death, most of the time due to an unforeseen complication. ____________________________________________________________________________________________  ____________________________________________________________________________________________  Preparing for your procedure (without sedation)  Instructions: . Oral Intake: Do not eat or drink anything for at least 3 hours prior to your procedure. . Transportation: Unless otherwise stated by your physician, you may drive yourself after the procedure. . Blood Pressure Medicine: Take your blood pressure medicine with a sip of water the morning of the procedure. . Blood thinners: Notify our staff if you are taking any blood thinners. Depending on which one you take, there will be specific instructions on how and when to stop it. . Diabetics on insulin: Notify the staff so that you can be scheduled 1st case in the morning. If your diabetes requires high dose insulin, take only  of your normal insulin dose the morning of the procedure and notify the staff that you have done so. . Preventing infections: Shower with an antibacterial soap the morning of your procedure.  . Build-up your immune system: Take 1000 mg of Vitamin C with every meal  (3 times a day) the day prior to your procedure. Marland Kitchen Antibiotics: Inform the staff if you have a condition or reason that requires you to take antibiotics before dental procedures. . Pregnancy: If you are pregnant, call and cancel the procedure. . Sickness: If you have a cold, fever, or any active infections, call and cancel the procedure. . Arrival: You must be in the facility at least 30 minutes prior to your scheduled procedure. . Children: Do not bring any children with you. . Dress appropriately: Bring dark clothing that you would not mind if they get stained. . Valuables: Do not bring any jewelry or valuables.  Procedure appointments are reserved for interventional treatments only. Marland Kitchen No Prescription Refills. . No medication changes will be discussed during procedure appointments. . No disability issues will be discussed.  Reasons to call and reschedule or cancel your procedure: (Following these recommendations will minimize the risk of a serious complication.) . Surgeries: Avoid having procedures within 2 weeks of any surgery. (Avoid for 2 weeks before or after any surgery). . Flu Shots: Avoid having procedures within 2 weeks of a flu shots or . (Avoid for 2 weeks before or after immunizations). . Barium: Avoid having a procedure within 7-10 days after having had a radiological study involving the use of radiological contrast. (Myelograms, Barium swallow or enema study). . Heart attacks: Avoid any elective procedures or surgeries for the initial 6 months after a "Myocardial Infarction" (Heart Attack). . Blood thinners: It is imperative that you stop these medications before procedures. Let us know if you if you take any blood thinner.  . Infection: Avoid procedures during or within two weeks of an infection (including chest colds or gastrointestinal  problems). Symptoms associated with infections include: Localized redness, fever, chills, night sweats or profuse sweating, burning sensation  when voiding, cough, congestion, stuffiness, runny nose, sore throat, diarrhea, nausea, vomiting, cold or Flu symptoms, recent or current infections. It is specially important if the infection is over the area that we intend to treat. Marland Kitchen Heart and lung problems: Symptoms that may suggest an active cardiopulmonary problem include: cough, chest pain, breathing difficulties or shortness of breath, dizziness, ankle swelling, uncontrolled high or unusually low blood pressure, and/or palpitations. If you are experiencing any of these symptoms, cancel your procedure and contact your primary care physician for an evaluation.  Remember:  Regular Business hours are:  Monday to Thursday 8:00 AM to 4:00 PM  Provider's Schedule: Milinda Pointer, MD:  Procedure days: Tuesday and Thursday 7:30 AM to 4:00 PM  Gillis Santa, MD:  Procedure days: Monday and Wednesday 7:30 AM to 4:00 PM ____________________________________________________________________________________________  Epidural Steroid Injection Patient Information  Description: The epidural space surrounds the nerves as they exit the spinal cord.  In some patients, the nerves can be compressed and inflamed by a bulging disc or a tight spinal canal (spinal stenosis).  By injecting steroids into the epidural space, we can bring irritated nerves into direct contact with a potentially helpful medication.  These steroids act directly on the irritated nerves and can reduce swelling and inflammation which often leads to decreased pain.  Epidural steroids may be injected anywhere along the spine and from the neck to the low back depending upon the location of your pain.   After numbing the skin with local anesthetic (like Novocaine), a small needle is passed into the epidural space slowly.  You may experience a sensation of pressure while this is being done.  The entire block usually last less than 10 minutes.  Conditions which may be treated by epidural  steroids:   Low back and leg pain  Neck and arm pain  Spinal stenosis  Post-laminectomy syndrome  Herpes zoster (shingles) pain  Pain from compression fractures  Preparation for the injection:  1. Do not eat any solid food or dairy products within 8 hours of your appointment.  2. You may drink clear liquids up to 3 hours before appointment.  Clear liquids include water, black coffee, juice or soda.  No milk or cream please. 3. You may take your regular medication, including pain medications, with a sip of water before your appointment  Diabetics should hold regular insulin (if taken separately) and take 1/2 normal NPH dos the morning of the procedure.  Carry some sugar containing items with you to your appointment. 4. A driver must accompany you and be prepared to drive you home after your procedure.  5. Bring all your current medications with your. 6. An IV may be inserted and sedation may be given at the discretion of the physician.   7. A blood pressure cuff, EKG and other monitors will often be applied during the procedure.  Some patients may need to have extra oxygen administered for a short period. 8. You will be asked to provide medical information, including your allergies, prior to the procedure.  We must know immediately if you are taking blood thinners (like Coumadin/Warfarin)  Or if you are allergic to IV iodine contrast (dye). We must know if you could possible be pregnant.  Possible side-effects:  Bleeding from needle site  Infection (rare, may require surgery)  Nerve injury (rare)  Numbness & tingling (temporary)  Difficulty urinating (rare, temporary)  Spinal headache ( a headache worse with upright posture)  Light -headedness (temporary)  Pain at injection site (several days)  Decreased blood pressure (temporary)  Weakness in arm/leg (temporary)  Pressure sensation in back/neck (temporary)  Call if you experience:  Fever/chills associated with  headache or increased back/neck pain.  Headache worsened by an upright position.  New onset weakness or numbness of an extremity below the injection site  Hives or difficulty breathing (go to the emergency room)  Inflammation or drainage at the infection site  Severe back/neck pain  Any new symptoms which are concerning to you  Please note:  Although the local anesthetic injected can often make your back or neck feel good for several hours after the injection, the pain will likely return.  It takes 3-7 days for steroids to work in the epidural space.  You may not notice any pain relief for at least that one week.  If effective, we will often do a series of three injections spaced 3-6 weeks apart to maximally decrease your pain.  After the initial series, we generally will wait several months before considering a repeat injection of the same type.  If you have any questions, please call [phone] [facility]

## 2018-10-06 NOTE — Progress Notes (Signed)
Patient's Name: Sarah [name]  MRN: [id number]  Referring Provider: Hortencia Pilar, MD  DOB: 02-23-1932  PCP: Hortencia Pilar, MD  DOS: 10/06/2018  Note by: Gillis Santa, MD  Service setting: Ambulatory outpatient  Specialty: Interventional Pain Management  Location: [facility] (AMB) Pain Management Facility    Patient type: Established   Primary Reason(s) for Visit: Encounter for post-procedure evaluation of chronic illness with mild to moderate exacerbation CC: Back Pain (mid thoracic)  HPI  [name] is a 85 y.o. year old, female patient, who comes today for a post-procedure evaluation. She has Closed fracture of fifth toe of right foot; Lumbar facet arthropathy; Lumbar radiculopathy; Lumbar disc herniation (L2/L3); Lumbar degenerative disc disease; Chronic pain syndrome; and Neuropathy on their problem list. Her primarily concern today is the Back Pain (mid thoracic)  Pain Assessment: Location: Mid Back Radiating: denies Onset: More than a month ago Duration: Chronic pain Quality: Aching, Discomfort Severity: 4 /10 (subjective, self-reported pain score)  Note: Reported level is compatible with observation.                         When using our objective Pain Scale, levels between 6 and 10/10 are said to belong in an emergency room, as it progressively worsens from a 6/10, described as severely limiting, requiring emergency care not usually available at an outpatient pain management facility. At a 6/10 level, communication becomes difficult and requires great effort. Assistance to reach the emergency department may be required. Facial flushing and profuse sweating along with potentially dangerous increases in heart rate and blood pressure will be evident. Effect on ADL: making bed this morning, she didn't think she was going to be able to get up.  Timing: Intermittent Modifying factors: rest will make the pain go away  BP: 124/84  HR: 66  [name] comes in today for post-procedure  evaluation.  Further details on both, my assessment(s), as well as the proposed treatment plan, please see below.  Post-Procedure Assessment  09/08/2018 Procedure: L2-L3 ESI #2 Pre-procedure pain score:  3/10 Post-procedure pain score: 0/10         Influential Factors: BMI: 24.33 kg/m Intra-procedural challenges: None observed.         Assessment challenges: None detected.              Reported side-effects: None.        Post-procedural adverse reactions or complications: None reported         Sedation: Please see nurses note. When no sedatives are used, the analgesic levels obtained are directly associated to the effectiveness of the local anesthetics. However, when sedation is provided, the level of analgesia obtained during the initial 1 hour following the intervention, is believed to be the result of a combination of factors. These factors may include, but are not limited to: 1. The effectiveness of the local anesthetics used. 2. The effects of the analgesic(s) and/or anxiolytic(s) used. 3. The degree of discomfort experienced by the patient at the time of the procedure. 4. The patients ability and reliability in recalling and recording the events. 5. The presence and influence of possible secondary gains and/or psychosocial factors. Reported result: Relief experienced during the 1st hour after the procedure: 100 % (Ultra-Short Term Relief)            Interpretative annotation: Clinically appropriate result. Analgesia during this period is likely to be Local Anesthetic and/or IV Sedative (Analgesic/Anxiolytic) related.  Effects of local anesthetic: The analgesic effects attained during this period are directly associated to the localized infiltration of local anesthetics and therefore cary significant diagnostic value as to the etiological location, or anatomical origin, of the pain. Expected duration of relief is directly dependent on the pharmacodynamics of the local anesthetic  used. Long-acting (4-6 hours) anesthetics used.  Reported result: Relief during the next 4 to 6 hour after the procedure: 100 % (Short-Term Relief)            Interpretative annotation: Clinically appropriate result. Analgesia during this period is likely to be Local Anesthetic-related.          Long-term benefit: Defined as the period of time past the expected duration of local anesthetics (1 hour for short-acting and 4-6 hours for long-acting). With the possible exception of prolonged sympathetic blockade from the local anesthetics, benefits during this period are typically attributed to, or associated with, other factors such as analgesic sensory neuropraxia, antiinflammatory effects, or beneficial biochemical changes provided by agents other than the local anesthetics.  Reported result: Extended relief following procedure: 100 %(pain is gradually coming back starting last week ) (Long-Term Relief)            Interpretative annotation: Clinically possible results. Good relief. No permanent benefit expected. Inflammation plays a part in the etiology to the pain.          Current benefits: Defined as reported results that persistent at this point in time.   Analgesia: 75 %            Function: Somewhat improved ROM: Somewhat improved Interpretative annotation: Recurrence of symptoms. No permanent benefit expected. Effective diagnostic intervention.          Interpretation: Results would suggest a successful diagnostic intervention.                  Plan:  Repeat treatment or therapy and compare extent and duration of benefits.                Laboratory Chemistry  Inflammation Markers (CRP: Acute Phase) (ESR: Chronic Phase) No results found for: CRP, ESRSEDRATE, LATICACIDVEN                       Rheumatology Markers No results found for: RF, ANA, LABURIC, URICUR, LYMEIGGIGMAB, LYMEABIGMQN, HLAB27                      Renal Function Markers Lab Results  Component Value Date   BUN 23 (H)  09/14/2015   CREATININE 0.80 09/14/2015   GFRAA >60 09/14/2015   GFRNONAA >60 09/14/2015                             Hepatic Function Markers Lab Results  Component Value Date   AST 19 11/26/2015   ALT 15 11/26/2015   ALBUMIN 4.7 11/26/2015   ALKPHOS 64 11/26/2015                        Electrolytes Lab Results  Component Value Date   NA 141 09/14/2015   K 3.5 09/14/2015   CL 107 09/14/2015   CALCIUM 9.7 09/14/2015   MG 2.2 09/14/2015                        Neuropathy Markers No results found for: VITAMINB12, FOLATE, HGBA1C, HIV  CNS Tests No results found for: COLORCSF, APPEARCSF, RBCCOUNTCSF, WBCCSF, POLYSCSF, LYMPHSCSF, EOSCSF, PROTEINCSF, GLUCCSF, JCVIRUS, CSFOLI, IGGCSF                      Bone Pathology Markers No results found for: VD25OH, [id number], [id number], [id number], 25OHVITD1, 25OHVITD2, 25OHVITD3, TESTOFREE, TESTOSTERONE                       Coagulation Parameters Lab Results  Component Value Date   INR 1.08 09/14/2015   LABPROT 14.2 09/14/2015   APTT 31 09/14/2015   PLT 177 09/14/2015                        Cardiovascular Markers Lab Results  Component Value Date   CKTOTAL 66 02/12/2013   CKMB < 0.5 (L) 02/12/2013   TROPONINI <0.03 09/14/2015   HGB 13.2 09/14/2015   HCT 38.3 09/14/2015                         CA Markers No results found for: CEA, CA125, LABCA2                      Note: Lab results reviewed.  Recent Diagnostic Imaging Results  DG C-Arm 1-60 Min-No Report Fluoroscopy was utilized by the requesting physician.  No radiographic  interpretation.   Complexity Note: Imaging results reviewed. Results shared with Ms. Hassan, using Layman's terms.                         Meds   Current Outpatient Medications:  .  Apoaequorin (PREVAGEN PO), Take by mouth once., Disp: , Rfl:  .  beta carotene w/minerals (OCUVITE) tablet, Take 1 tablet by mouth daily., Disp: , Rfl:  .  dicyclomine (BENTYL) 10 MG capsule,  Take 10 mg by mouth 4 (four) times daily -  before meals and at bedtime., Disp: , Rfl:  .  hydrOXYzine (ATARAX/VISTARIL) 10 MG tablet, Take 1 tablet by mouth as needed., Disp: , Rfl:  .  levothyroxine (SYNTHROID, LEVOTHROID) 100 MCG tablet, TAKE 1 TABLET BY MOUTH ONCE DAILY ON EMPTY STOMACH WITH GLAS OF WATER 30-60MIN BEFORE BREAKFAST, Disp: , Rfl: 11 .  lovastatin (MEVACOR) 20 MG tablet, Take 20 mg by mouth at bedtime., Disp: , Rfl:  .  LUMIGAN 0.01 % SOLN, Place 1 drop into the left eye at bedtime., Disp: , Rfl:  .  Melatonin 1 MG TABS, Take 10 mg by mouth once., Disp: , Rfl:  .  montelukast (SINGULAIR) 10 MG tablet, Take 10 mg by mouth at bedtime., Disp: , Rfl:  .  pregabalin (LYRICA) 100 MG capsule, Take 100 mg by mouth 3 (three) times daily., Disp: , Rfl:  .  sertraline (ZOLOFT) 50 MG tablet, Take 1 tablet by mouth daily., Disp: , Rfl:  .  simvastatin (ZOCOR) 10 MG tablet, Take 10 mg by mouth at bedtime., Disp: , Rfl:  .  SYNTHROID 125 MCG tablet, Take 1 tablet by mouth daily., Disp: , Rfl:  .  aspirin EC 81 MG tablet, Take 1 tablet by mouth daily., Disp: , Rfl:  .  gabapentin (NEURONTIN) 100 MG capsule, Take 1 capsule by mouth at bedtime., Disp: , Rfl:  .  phenazopyridine (PYRIDIUM) 97 MG tablet, Take 97 mg by mouth once., Disp: , Rfl:  .  RESTASIS 0.05 % ophthalmic emulsion, Apply 1 drop to  eye 2 (two) times daily., Disp: , Rfl:   ROS  Constitutional: Denies any fever or chills Gastrointestinal: No reported hemesis, hematochezia, vomiting, or acute GI distress Musculoskeletal: Denies any acute onset joint swelling, redness, loss of ROM, or weakness Neurological: No reported episodes of acute onset apraxia, aphasia, dysarthria, agnosia, amnesia, paralysis, loss of coordination, or loss of consciousness  Allergies  Ms. Comins is allergic to codeine; hydrocodone; peanuts [peanut oil]; penicillins; tramadol; and vitamin d analogs.  Echo  Drug: Ms. Stelzer  has no history on file for  drug. Alcohol:  reports no history of alcohol use. Tobacco:  reports that she has never smoked. She has never used smokeless tobacco. Medical:  has a past medical history of Cancer ([place]), Seizures ([place]), and Thyroid disease. Surgical: Ms. Koebel  has a past surgical history that includes Abdominal hysterectomy and Breast biopsy (Bilateral). Family: family history includes Brain cancer in her sister; Breast cancer in her maternal aunt; Breast cancer (age of onset: 69) in her maternal aunt; Breast cancer (age of onset: 28) in her mother; Breast cancer (age of onset: 83) in her maternal uncle; Cancer in her brother and sister; Tracheal cancer in her father.  Constitutional Exam  General appearance: Well nourished, well developed, and well hydrated. In no apparent acute distress Vitals:   10/06/18 1348  BP: 124/84  Pulse: 66  Resp: 16  Temp: 98.1 F (36.7 C)  TempSrc: Oral  SpO2: 100%  Weight: 160 lb (72.6 kg)  Height: 5' 8"  (1.727 m)   BMI Assessment: Estimated body mass index is 24.33 kg/m as calculated from the following:   Height as of this encounter: 5' 8"  (1.727 m).   Weight as of this encounter: 160 lb (72.6 kg).  BMI interpretation table: BMI level Category Range association with higher incidence of chronic pain  <18 kg/m2 Underweight   18.5-24.9 kg/m2 Ideal body weight   25-29.9 kg/m2 Overweight Increased incidence by 20%  30-34.9 kg/m2 Obese (Class I) Increased incidence by 68%  35-39.9 kg/m2 Severe obesity (Class II) Increased incidence by 136%  >40 kg/m2 Extreme obesity (Class III) Increased incidence by 254%   Patient's current BMI Ideal Body weight  Body mass index is 24.33 kg/m. Ideal body weight: 63.9 kg (140 lb 14 oz) Adjusted ideal body weight: 67.4 kg (148 lb 8.4 oz)   BMI Readings from Last 4 Encounters:  10/06/18 24.33 kg/m  09/08/18 24.33 kg/m  08/31/18 24.33 kg/m  07/26/18 24.63 kg/m   Wt Readings from Last 4 Encounters:  10/06/18 160 lb (72.6 kg)   09/08/18 160 lb (72.6 kg)  08/31/18 160 lb (72.6 kg)  07/26/18 162 lb (73.5 kg)  Psych/Mental status: Alert, oriented x 3 (person, place, & time)       Eyes: PERLA Respiratory: No evidence of acute respiratory distress  Cervical Spine Area Exam  Skin & Axial Inspection: No masses, redness, edema, swelling, or associated skin lesions Alignment: Symmetrical Functional ROM: Unrestricted ROM      Stability: No instability detected Muscle Tone/Strength: Functionally intact. No obvious neuro-muscular anomalies detected. Sensory (Neurological): Unimpaired Palpation: No palpable anomalies              Upper Extremity (UE) Exam    Side: Right upper extremity  Side: Left upper extremity  Skin & Extremity Inspection: Skin color, temperature, and hair growth are WNL. No peripheral edema or cyanosis. No masses, redness, swelling, asymmetry, or associated skin lesions. No contractures.  Skin & Extremity Inspection: Skin color, temperature, and hair  growth are WNL. No peripheral edema or cyanosis. No masses, redness, swelling, asymmetry, or associated skin lesions. No contractures.  Functional ROM: Unrestricted ROM          Functional ROM: Unrestricted ROM          Muscle Tone/Strength: Functionally intact. No obvious neuro-muscular anomalies detected.  Muscle Tone/Strength: Functionally intact. No obvious neuro-muscular anomalies detected.  Sensory (Neurological): Unimpaired          Sensory (Neurological): Unimpaired          Palpation: No palpable anomalies              Palpation: No palpable anomalies              Provocative Test(s):  Phalen's test: deferred Tinel's test: deferred Apley's scratch test (touch opposite shoulder):  Action 1 (Across chest): deferred Action 2 (Overhead): deferred Action 3 (LB reach): deferred   Provocative Test(s):  Phalen's test: deferred Tinel's test: deferred Apley's scratch test (touch opposite shoulder):  Action 1 (Across chest): deferred Action 2  (Overhead): deferred Action 3 (LB reach): deferred    Thoracic Spine Area Exam  Skin & Axial Inspection: No masses, redness, or swelling Alignment: Symmetrical Functional ROM: Unrestricted ROM Stability: No instability detected Muscle Tone/Strength: Functionally intact. No obvious neuro-muscular anomalies detected. Sensory (Neurological): Unimpaired Muscle strength & Tone: No palpable anomalies  Lumbar Spine Area Exam  Skin & Axial Inspection: No masses, redness, or swelling Alignment: Symmetrical Functional ROM: Pain restricted ROM affecting primarily the left Stability: No instability detected Muscle Tone/Strength: Functionally intact. No obvious neuro-muscular anomalies detected. Sensory (Neurological): Dermatomal pain pattern Palpation: Complains of area being tender to palpation       Provocative Tests: Hyperextension/rotation test: (+) due to pain. Lumbar quadrant test (Kemp's test): (+) on the left for foraminal stenosis Lateral bending test: (+) ipsilateral radicular pain, on the left. Positive for left-sided foraminal stenosis. Patrick's Maneuver: deferred today                   FABER* test: deferred today                   S-I anterior distraction/compression test: deferred today         S-I lateral compression test: deferred today         S-I Thigh-thrust test: deferred today         S-I Gaenslen's test: deferred today         *(Flexion, ABduction and External Rotation)  Gait & Posture Assessment  Ambulation: Unassisted Gait: Relatively normal for age and body habitus Posture: WNL   Lower Extremity Exam    Side: Right lower extremity  Side: Left lower extremity  Stability: No instability observed          Stability: No instability observed          Skin & Extremity Inspection: Skin color, temperature, and hair growth are WNL. No peripheral edema or cyanosis. No masses, redness, swelling, asymmetry, or associated skin lesions. No contractures.  Skin & Extremity  Inspection: Skin color, temperature, and hair growth are WNL. No peripheral edema or cyanosis. No masses, redness, swelling, asymmetry, or associated skin lesions. No contractures.  Functional ROM: Unrestricted ROM                  Functional ROM: Decreased ROM for hip and knee joints          Muscle Tone/Strength: Functionally intact. No obvious neuro-muscular anomalies detected.  Muscle  Tone/Strength: Functionally intact. No obvious neuro-muscular anomalies detected.  Sensory (Neurological): Unimpaired        Sensory (Neurological): Dermatomal pain pattern medial portion of foot (L4)  DTR: Patellar: 1+: trace Achilles: deferred today Plantar: deferred today  DTR: Patellar: 1+: trace Achilles: deferred today Plantar: deferred today  Palpation: No palpable anomalies  Palpation: No palpable anomalies   Assessment  Primary Diagnosis & Pertinent Problem List: The primary encounter diagnosis was Lumbar radiculopathy. Diagnoses of Lumbar disc herniation (L2/L3) and Lumbar facet arthropathy (L5/S1) were also pertinent to this visit.  Status Diagnosis  Responding Persistent Persistent 1. Lumbar radiculopathy   2. Lumbar disc herniation (L2/L3)   3. Lumbar facet arthropathy (L5/S1)      Patient follows up for post procedure evaluation status post L2-L3 ESI #2.  Patient endorses significant pain relief, almost 100% for the first 3 weeks after her epidural steroid injection but states that during the last week she has noted return of her pain that radiates on her left leg.  We discussed repeating lumbar epidural steroid injection #3.  Patient is not a diabetic.  Risks and benefits of this procedure were discussed and patient like to proceed.  In regards to therapeutic planning, we also discussed possible spinal cord stimulator trial.  If we are to proceed with this, patient will need work-up in the form of thoracic MRI as well as psychological evaluation.  If appropriate, can consider  thoracolumbar spinal cord stimulation.  Plan: -Left L2-L3 ESI #3 without sedation under fluoroscopy.  Provider-requested follow-up: Return in about 1 week (around 10/13/2018) for Procedure.  Future Appointments  Date Time Provider [place]  10/13/2018  9:45 AM Gillis Santa, MD [facility] None    Primary Care Physician: Hortencia Pilar, MD Location: [facility] Out[facility] Pain Management Facility Note by: Gillis Santa, M.D Date: 10/06/2018; Time: 3:24 PM  Patient Instructions  ____________________________________________________________________________________________  General Risks and Possible Complications  Patient Responsibilities: It is important that you read this as it is part of your informed consent. It is our duty to inform you of the risks and possible complications associated with treatments offered to you. It is your responsibility as a patient to read this and to ask questions about anything that is not clear or that you believe was not covered in this document.  Patient's Rights: You have the right to refuse treatment. You also have the right to change your mind, even after initially having agreed to have the treatment done. However, under this last option, if you wait until the last second to change your mind, you may be charged for the materials used up to that point.  Introduction: Medicine is not an Chief Strategy Officer. Everything in Medicine, including the lack of treatment(s), carries the potential for danger, harm, or loss (which is by definition: Risk). In Medicine, a complication is a secondary problem, condition, or disease that can aggravate an already existing one. All treatments carry the risk of possible complications. The fact that a side effects or complications occurs, does not imply that the treatment was conducted incorrectly. It must be clearly understood that these can happen even when everything is done following the highest safety standards.  No treatment: You  can choose not to proceed with the proposed treatment alternative. The "PRO(s)" would include: avoiding the risk of complications associated with the therapy. The "CON(s)" would include: not getting any of the treatment benefits. These benefits fall under one of three categories: diagnostic; therapeutic; and/or palliative. Diagnostic benefits include: getting information which  can ultimately lead to improvement of the disease or symptom(s). Therapeutic benefits are those associated with the successful treatment of the disease. Finally, palliative benefits are those related to the decrease of the primary symptoms, without necessarily curing the condition (example: decreasing the pain from a flare-up of a chronic condition, such as incurable terminal cancer).  General Risks and Complications: These are associated to most interventional treatments. They can occur alone, or in combination. They fall under one of the following six (6) categories: no benefit or worsening of symptoms; bleeding; infection; nerve damage; allergic reactions; and/or death. 1. No benefits or worsening of symptoms: In Medicine there are no guarantees, only probabilities. No healthcare provider can ever guarantee that a medical treatment will work, they can only state the probability that it may. Furthermore, there is always the possibility that the condition may worsen, either directly, or indirectly, as a consequence of the treatment. 2. Bleeding: This is more common if the patient is taking a blood thinner, either prescription or over the counter (example: Goody Powders, Fish oil, Aspirin, Garlic, etc.), or if suffering a condition associated with impaired coagulation (example: Hemophilia, cirrhosis of the liver, low platelet counts, etc.). However, even if you do not have one on these, it can still happen. If you have any of these conditions, or take one of these drugs, make sure to notify your treating physician. 3. Infection: This is  more common in patients with a compromised immune system, either due to disease (example: diabetes, cancer, human immunodeficiency virus [HIV], etc.), or due to medications or treatments (example: therapies used to treat cancer and rheumatological diseases). However, even if you do not have one on these, it can still happen. If you have any of these conditions, or take one of these drugs, make sure to notify your treating physician. 4. Nerve Damage: This is more common when the treatment is an invasive one, but it can also happen with the use of medications, such as those used in the treatment of cancer. The damage can occur to small secondary nerves, or to large primary ones, such as those in the spinal cord and brain. This damage may be temporary or permanent and it may lead to impairments that can range from temporary numbness to permanent paralysis and/or brain death. 5. Allergic Reactions: Any time a substance or material comes in contact with our body, there is the possibility of an allergic reaction. These can range from a mild skin rash (contact dermatitis) to a severe systemic reaction (anaphylactic reaction), which can result in death. 6. Death: In general, any medical intervention can result in death, most of the time due to an unforeseen complication. ____________________________________________________________________________________________  ____________________________________________________________________________________________  Preparing for your procedure (without sedation)  Instructions: . Oral Intake: Do not eat or drink anything for at least 3 hours prior to your procedure. . Transportation: Unless otherwise stated by your physician, you may drive yourself after the procedure. . Blood Pressure Medicine: Take your blood pressure medicine with a sip of water the morning of the procedure. . Blood thinners: Notify our staff if you are taking any blood thinners. Depending on which  one you take, there will be specific instructions on how and when to stop it. . Diabetics on insulin: Notify the staff so that you can be scheduled 1st case in the morning. If your diabetes requires high dose insulin, take only  of your normal insulin dose the morning of the procedure and notify the staff that you have done so. Marland Kitchen  Preventing infections: Shower with an antibacterial soap the morning of your procedure.  . Build-up your immune system: Take 1000 mg of Vitamin C with every meal (3 times a day) the day prior to your procedure. Marland Kitchen Antibiotics: Inform the staff if you have a condition or reason that requires you to take antibiotics before dental procedures. . Pregnancy: If you are pregnant, call and cancel the procedure. . Sickness: If you have a cold, fever, or any active infections, call and cancel the procedure. . Arrival: You must be in the facility at least 30 minutes prior to your scheduled procedure. . Children: Do not bring any children with you. . Dress appropriately: Bring dark clothing that you would not mind if they get stained. . Valuables: Do not bring any jewelry or valuables.  Procedure appointments are reserved for interventional treatments only. Marland Kitchen No Prescription Refills. . No medication changes will be discussed during procedure appointments. . No disability issues will be discussed.  Reasons to call and reschedule or cancel your procedure: (Following these recommendations will minimize the risk of a serious complication.) . Surgeries: Avoid having procedures within 2 weeks of any surgery. (Avoid for 2 weeks before or after any surgery). . Flu Shots: Avoid having procedures within 2 weeks of a flu shots or . (Avoid for 2 weeks before or after immunizations). . Barium: Avoid having a procedure within 7-10 days after having had a radiological study involving the use of radiological contrast. (Myelograms, Barium swallow or enema study). . Heart attacks: Avoid any elective  procedures or surgeries for the initial 6 months after a "Myocardial Infarction" (Heart Attack). . Blood thinners: It is imperative that you stop these medications before procedures. Let us know if you if you take any blood thinner.  . Infection: Avoid procedures during or within two weeks of an infection (including chest colds or gastrointestinal problems). Symptoms associated with infections include: Localized redness, fever, chills, night sweats or profuse sweating, burning sensation when voiding, cough, congestion, stuffiness, runny nose, sore throat, diarrhea, nausea, vomiting, cold or Flu symptoms, recent or current infections. It is specially important if the infection is over the area that we intend to treat. Marland Kitchen Heart and lung problems: Symptoms that may suggest an active cardiopulmonary problem include: cough, chest pain, breathing difficulties or shortness of breath, dizziness, ankle swelling, uncontrolled high or unusually low blood pressure, and/or palpitations. If you are experiencing any of these symptoms, cancel your procedure and contact your primary care physician for an evaluation.  Remember:  Regular Business hours are:  Monday to Thursday 8:00 AM to 4:00 PM  Provider's Schedule: Milinda Pointer, MD:  Procedure days: Tuesday and Thursday 7:30 AM to 4:00 PM  Gillis Santa, MD:  Procedure days: Monday and Wednesday 7:30 AM to 4:00 PM ____________________________________________________________________________________________  Epidural Steroid Injection Patient Information  Description: The epidural space surrounds the nerves as they exit the spinal cord.  In some patients, the nerves can be compressed and inflamed by a bulging disc or a tight spinal canal (spinal stenosis).  By injecting steroids into the epidural space, we can bring irritated nerves into direct contact with a potentially helpful medication.  These steroids act directly on the irritated nerves and can reduce  swelling and inflammation which often leads to decreased pain.  Epidural steroids may be injected anywhere along the spine and from the neck to the low back depending upon the location of your pain.   After numbing the skin with local anesthetic (like Novocaine), a small  needle is passed into the epidural space slowly.  You may experience a sensation of pressure while this is being done.  The entire block usually last less than 10 minutes.  Conditions which may be treated by epidural steroids:   Low back and leg pain  Neck and arm pain  Spinal stenosis  Post-laminectomy syndrome  Herpes zoster (shingles) pain  Pain from compression fractures  Preparation for the injection:  1. Do not eat any solid food or dairy products within 8 hours of your appointment.  2. You may drink clear liquids up to 3 hours before appointment.  Clear liquids include water, black coffee, juice or soda.  No milk or cream please. 3. You may take your regular medication, including pain medications, with a sip of water before your appointment  Diabetics should hold regular insulin (if taken separately) and take 1/2 normal NPH dos the morning of the procedure.  Carry some sugar containing items with you to your appointment. 4. A driver must accompany you and be prepared to drive you home after your procedure.  5. Bring all your current medications with your. 6. An IV may be inserted and sedation may be given at the discretion of the physician.   7. A blood pressure cuff, EKG and other monitors will often be applied during the procedure.  Some patients may need to have extra oxygen administered for a short period. 8. You will be asked to provide medical information, including your allergies, prior to the procedure.  We must know immediately if you are taking blood thinners (like Coumadin/Warfarin)  Or if you are allergic to IV iodine contrast (dye). We must know if you could possible be pregnant.  Possible  side-effects:  Bleeding from needle site  Infection (rare, may require surgery)  Nerve injury (rare)  Numbness & tingling (temporary)  Difficulty urinating (rare, temporary)  Spinal headache ( a headache worse with upright posture)  Light -headedness (temporary)  Pain at injection site (several days)  Decreased blood pressure (temporary)  Weakness in arm/leg (temporary)  Pressure sensation in back/neck (temporary)  Call if you experience:  Fever/chills associated with headache or increased back/neck pain.  Headache worsened by an upright position.  New onset weakness or numbness of an extremity below the injection site  Hives or difficulty breathing (go to the emergency room)  Inflammation or drainage at the infection site  Severe back/neck pain  Any new symptoms which are concerning to you  Please note:  Although the local anesthetic injected can often make your back or neck feel good for several hours after the injection, the pain will likely return.  It takes 3-7 days for steroids to work in the epidural space.  You may not notice any pain relief for at least that one week.  If effective, we will often do a series of three injections spaced 3-6 weeks apart to maximally decrease your pain.  After the initial series, we generally will wait several months before considering a repeat injection of the same type.  If you have any questions, please call [phone] [facility]

## 2018-10-06 NOTE — Progress Notes (Signed)
Safety precautions to be maintained throughout the outpatient stay will include: orient to surroundings, keep bed in low position, maintain call bell within reach at all times, provide assistance with transfer out of bed and ambulation.  

## 2018-10-13 ENCOUNTER — Encounter: Payer: Self-pay | Admitting: Student in an Organized Health Care Education/Training Program

## 2018-10-13 ENCOUNTER — Ambulatory Visit (HOSPITAL_BASED_OUTPATIENT_CLINIC_OR_DEPARTMENT_OTHER): Payer: Medicare Other | Admitting: Student in an Organized Health Care Education/Training Program

## 2018-10-13 ENCOUNTER — Ambulatory Visit
Admission: RE | Admit: 2018-10-13 | Discharge: 2018-10-13 | Disposition: A | Discharge status: Home / Self Care | Payer: Medicare Other | Source: Ambulatory Visit | Attending: Student in an Organized Health Care Education/Training Program | Admitting: Student in an Organized Health Care Education/Training Program

## 2018-10-13 ENCOUNTER — Other Ambulatory Visit: Payer: Self-pay

## 2018-10-13 VITALS — BP 131/83 | HR 70 | Temp 97.6°F | Resp 15 | Ht 68.0 in | Wt 161.0 lb

## 2018-10-13 DIAGNOSIS — M5416 Radiculopathy, lumbar region: Secondary | ICD-10-CM | POA: Diagnosis present

## 2018-10-13 MED ORDER — DEXAMETHASONE SODIUM PHOSPHATE 10 MG/ML IJ SOLN
INTRAMUSCULAR | Status: AC
  Filled 2018-10-13: qty 1

## 2018-10-13 MED ORDER — SODIUM CHLORIDE 0.9% FLUSH
2.0000 mL | Freq: Once | INTRAVENOUS | Status: AC
  Administered 2018-10-13: 10 mL

## 2018-10-13 MED ORDER — ROPIVACAINE HCL 2 MG/ML IJ SOLN
2.0000 mL | Freq: Once | INTRAMUSCULAR | Status: AC
  Administered 2018-10-13: 10 mL via EPIDURAL

## 2018-10-13 MED ORDER — IOPAMIDOL (ISOVUE-M 200) INJECTION 41%
INTRAMUSCULAR | Status: AC
  Filled 2018-10-13: qty 10

## 2018-10-13 MED ORDER — ROPIVACAINE HCL 2 MG/ML IJ SOLN
INTRAMUSCULAR | Status: AC
  Filled 2018-10-13: qty 10

## 2018-10-13 MED ORDER — SODIUM CHLORIDE (PF) 0.9 % IJ SOLN
INTRAMUSCULAR | Status: AC
  Filled 2018-10-13: qty 10

## 2018-10-13 MED ORDER — DEXAMETHASONE SODIUM PHOSPHATE 10 MG/ML IJ SOLN
10.0000 mg | Freq: Once | INTRAMUSCULAR | Status: AC
  Administered 2018-10-13: 10 mg

## 2018-10-13 MED ORDER — LIDOCAINE HCL 2 % IJ SOLN
10.0000 mL | Freq: Once | INTRAMUSCULAR | Status: AC
  Administered 2018-10-13: 400 mg

## 2018-10-13 MED ORDER — IOPAMIDOL (ISOVUE-M 200) INJECTION 41%
10.0000 mL | Freq: Once | INTRAMUSCULAR | Status: AC
  Administered 2018-10-13: 10 mL via EPIDURAL

## 2018-10-13 MED ORDER — LIDOCAINE HCL 2 % IJ SOLN
INTRAMUSCULAR | Status: AC
  Filled 2018-10-13: qty 20

## 2018-10-13 NOTE — Progress Notes (Signed)
Safety precautions to be maintained throughout the outpatient stay will include: orient to surroundings, keep bed in low position, maintain call bell within reach at all times, provide assistance with transfer out of bed and ambulation.  

## 2018-10-13 NOTE — Progress Notes (Signed)
Patient's Name: Sarah [name]  MRN: [id number]  Referring Provider: Hortencia Pilar, MD  DOB: [birth year]  PCP: Hortencia Pilar, MD  DOS: 10/13/2018  Note by: Gillis Santa, MD  Service setting: Ambulatory outpatient  Specialty: Interventional Pain Management  Patient type: Established  Location: [facility] (AMB) Pain Management Facility  Visit type: Interventional Procedure   Primary Reason for Visit: Interventional Pain Management Treatment. CC: Back Pain (lower)  Procedure:          Anesthesia, Analgesia, Anxiolysis:  Type: Diagnostic Inter-Laminar Epidural Steroid Injection  #3  Region: Lumbar Level: L2-3 Level. Laterality: Left-Sided         Type: Local Anesthesia Indication(s): Analgesia         Route: Infiltration ([place]/IM) IV Access: Declined Sedation: Declined  Local Anesthetic: Lidocaine 1-2%  Position: Prone with head of the table was raised to facilitate breathing.   Indications: 1. Lumbar radiculopathy    Pain Score: Pre-procedure: 0-No pain("pain score of 10 last night")/10 Post-procedure: 0-No pain/10  Pre-op Assessment:  Sarah [name] is a 85 y.o. (year old), female patient, seen today for interventional treatment. She  has a past surgical history that includes Abdominal hysterectomy and Breast biopsy (Bilateral). Sarah [name] has a current medication list which includes the following prescription(s): apoaequorin, beta carotene w/minerals, dicyclomine, hydroxyzine, levothyroxine, lovastatin, lumigan, melatonin, phenazopyridine, pregabalin, sertraline, simvastatin, synthroid, aspirin ec, gabapentin, montelukast, and restasis. Her primarily concern today is the Back Pain (lower)  Initial Vital Signs:  Pulse/HCG Rate: 71  Temp: 97.6 F (36.4 C) Resp: 18 BP: 126/74 SpO2: 100 %  BMI: Estimated body mass index is 24.48 kg/m as calculated from the following:   Height as of this encounter: 5\' 8"  (1.727 m).   Weight as of this encounter: 161 lb (73 kg).  Risk  Assessment: Allergies: Reviewed. She is allergic to codeine; hydrocodone; peanuts [peanut oil]; penicillins; tramadol; and vitamin d analogs.  Allergy Precautions: None required Coagulopathies: Reviewed. None identified.  Blood-thinner therapy: None at this time Active Infection(s): Reviewed. None identified. Sarah [name] is afebrile  Site Confirmation: Sarah [name] was asked to confirm the procedure and laterality before marking the site Procedure checklist: Completed Consent: Before the procedure and under the influence of no sedative(s), amnesic(s), or anxiolytics, the patient was informed of the treatment options, risks and possible complications. To fulfill our ethical and legal obligations, as recommended by the American Medical Association's Code of Ethics, I have informed the patient of my clinical impression; the nature and purpose of the treatment or procedure; the risks, benefits, and possible complications of the intervention; the alternatives, including doing nothing; the risk(s) and benefit(s) of the alternative treatment(s) or procedure(s); and the risk(s) and benefit(s) of doing nothing. The patient was provided information about the general risks and possible complications associated with the procedure. These may include, but are not limited to: failure to achieve desired goals, infection, bleeding, organ or nerve damage, allergic reactions, paralysis, and death. In addition, the patient was informed of those risks and complications associated to Spine-related procedures, such as failure to decrease pain; infection (i.e.: Meningitis, epidural or intraspinal abscess); bleeding (i.e.: epidural hematoma, subarachnoid hemorrhage, or any other type of intraspinal or peri-dural bleeding); organ or nerve damage (i.e.: Any type of peripheral nerve, nerve root, or spinal cord injury) with subsequent damage to sensory, motor, and/or autonomic systems, resulting in permanent pain, numbness, and/or  weakness of one or several areas of the body; allergic reactions; (i.e.: anaphylactic reaction); and/or death. Furthermore, the patient was informed of  those risks and complications associated with the medications. These include, but are not limited to: allergic reactions (i.e.: anaphylactic or anaphylactoid reaction(s)); adrenal axis suppression; blood sugar elevation that in diabetics may result in ketoacidosis or comma; water retention that in patients with history of congestive heart failure may result in shortness of breath, pulmonary edema, and decompensation with resultant heart failure; weight gain; swelling or edema; medication-induced neural toxicity; particulate matter embolism and blood vessel occlusion with resultant organ, and/or nervous system infarction; and/or aseptic necrosis of one or more joints. Finally, the patient was informed that Medicine is not an exact science; therefore, there is also the possibility of unforeseen or unpredictable risks and/or possible complications that may result in a catastrophic outcome. The patient indicated having understood very clearly. We have given the patient no guarantees and we have made no promises. Enough time was given to the patient to ask questions, all of which were answered to the patient's satisfaction. Sarah [name] has indicated that she wanted to continue with the procedure. Attestation: I, the ordering provider, attest that I have discussed with the patient the benefits, risks, side-effects, alternatives, likelihood of achieving goals, and potential problems during recovery for the procedure that I have provided informed consent. Date  Time: 10/13/2018  9:23 AM  Pre-Procedure Preparation:  Monitoring: As per clinic protocol. Respiration, ETCO2, SpO2, BP, heart rate and rhythm monitor placed and checked for adequate function Safety Precautions: Patient was assessed for positional comfort and pressure points before starting the  procedure. Time-out: I initiated and conducted the "Time-out" before starting the procedure, as per protocol. The patient was asked to participate by confirming the accuracy of the "Time Out" information. Verification of the correct person, site, and procedure were performed and confirmed by me, the nursing staff, and the patient. "Time-out" conducted as per Joint Commission's Universal Protocol (UP.01.01.01). Time: 1000  Description of Procedure:          Target Area: The interlaminar space, initially targeting the lower laminar border of the superior vertebral body. Approach: Paramedial approach. Area Prepped: Entire Posterior Lumbar Region Prepping solution: ChloraPrep (2% chlorhexidine gluconate and 70% isopropyl alcohol) Safety Precautions: Aspiration looking for blood return was conducted prior to all injections. At no point did we inject any substances, as a needle was being advanced. No attempts were made at seeking any paresthesias. Safe injection practices and needle disposal techniques used. Medications properly checked for expiration dates. SDV (single dose vial) medications used. Description of the Procedure: Protocol guidelines were followed. The procedure needle was introduced through the skin, ipsilateral to the reported pain, and advanced to the target area. Bone was contacted and the needle walked caudad, until the lamina was cleared. The epidural space was identified using "loss-of-resistance technique" with 2-3 ml of PF-NaCl (0.9% NSS), in a 5cc LOR glass syringe.  Vitals:   10/13/18 0926 10/13/18 1000 10/13/18 1010  BP: 126/74 129/82 131/83  Pulse: 71 69 70  Resp: 18 13 15   Temp: 97.6 F (36.4 C)    TempSrc: Oral    SpO2: 100% 98% 99%  Weight: 161 lb (73 kg)    Height: 5\' 8"  (1.727 m)      Start Time: 1000 hrs. End Time: 1007 hrs.  Materials:  Needle(s) Type: Epidural needle Gauge: 17G Length: 3.5-in Medication(s): Please see orders for medications and dosing  details. 9 CC solution made of 6 cc of preservative-free saline, 2 cc of 0.2% ropivacaine, 1 cc of Decadron 10 mg/cc. Imaging Guidance (Spinal):  Type of Imaging Technique: Fluoroscopy Guidance (Spinal) Indication(s): Assistance in needle guidance and placement for procedures requiring needle placement in or near specific anatomical locations not easily accessible without such assistance. Exposure Time: Please see nurses notes. Contrast: Before injecting any contrast, we confirmed that the patient did not have an allergy to iodine, shellfish, or radiological contrast. Once satisfactory needle placement was completed at the desired level, radiological contrast was injected. Contrast injected under live fluoroscopy. No contrast complications. See chart for type and volume of contrast used. Fluoroscopic Guidance: I was personally present during the use of fluoroscopy. "Tunnel Vision Technique" used to obtain the best possible view of the target area. Parallax error corrected before commencing the procedure. "Direction-depth-direction" technique used to introduce the needle under continuous pulsed fluoroscopy. Once target was reached, antero-posterior, oblique, and lateral fluoroscopic projection used confirm needle placement in all planes. Images permanently stored in EMR. Interpretation: I personally interpreted the imaging intraoperatively. Adequate needle placement confirmed in multiple planes. Appropriate spread of contrast into desired area was observed. No evidence of afferent or efferent intravascular uptake. No intrathecal or subarachnoid spread observed. Permanent images saved into the patient's record.  Antibiotic Prophylaxis:   Anti-infectives (From admission, onward)   None     Indication(s): None identified  Post-operative Assessment:  Post-procedure Vital Signs:  Pulse/HCG Rate: 70  Temp: 97.6 F (36.4 C) Resp: 15 BP: 131/83 SpO2: 99 %  EBL: None  Complications: No  immediate post-treatment complications observed by team, or reported by patient.  Note: The patient tolerated the entire procedure well. A repeat set of vitals were taken after the procedure and the patient was kept under observation following institutional policy, for this type of procedure. Post-procedural neurological assessment was performed, showing return to baseline, prior to discharge. The patient was provided with post-procedure discharge instructions, including a section on how to identify potential problems. Should any problems arise concerning this procedure, the patient was given instructions to immediately contact us, at any time, without hesitation. In any case, we plan to contact the patient by telephone for a follow-up status report regarding this interventional procedure.  Comments:  No additional relevant information. 5 out of 5 strength bilateral lower extremity: Plantar flexion, dorsiflexion, knee flexion, knee extension.  Plan of Care   Imaging Orders     DG C-Arm 1-60 Min-No Report Procedure Orders    No procedure(s) ordered today    Medications ordered for procedure: Meds ordered this encounter  Medications  . iopamidol (ISOVUE-M) 41 % intrathecal injection 10 mL  . ropivacaine (PF) 2 mg/mL (0.2%) (NAROPIN) injection 2 mL  . sodium chloride flush (NS) 0.9 % injection 2 mL  . lidocaine (XYLOCAINE) 2 % (with pres) injection 200 mg  . dexamethasone (DECADRON) injection 10 mg   Medications administered: We administered iopamidol, ropivacaine (PF) 2 mg/mL (0.2%), sodium chloride flush, lidocaine, and dexamethasone.  See the medical record for exact dosing, route, and time of administration.  Disposition: Discharge home  Discharge Date & Time: 10/13/2018;   hrs.   Physician-requested Follow-up: Return in about 5 weeks (around 11/17/2018).  Future Appointments  Date Time Provider [place]  11/16/2018 10:00 AM Gillis Santa, MD [facility] None   Primary Care  Physician: Hortencia Pilar, MD Location: [facility] Note by: Gillis Santa, MD Date: 10/13/2018; Time: 10:30 AM  Disclaimer:  Medicine is not an exact science. The only guarantee in medicine is that nothing is guaranteed. It is important to note that the decision to proceed  with this intervention was based on the information collected from the patient. The Data and conclusions were drawn from the patient's questionnaire, the interview, and the physical examination. Because the information was provided in large part by the patient, it cannot be guaranteed that it has not been purposely or unconsciously manipulated. Every effort has been made to obtain as much relevant data as possible for this evaluation. It is important to note that the conclusions that lead to this procedure are derived in large part from the available data. Always take into account that the treatment will also be dependent on availability of resources and existing treatment guidelines, considered by other Pain Management Practitioners as being common knowledge and practice, at the time of the intervention. For Medico-Legal purposes, it is also important to point out that variation in procedural techniques and pharmacological choices are the acceptable norm. The indications, contraindications, technique, and results of the above procedure should only be interpreted and judged by a Board-Certified Interventional Pain Specialist with extensive familiarity and expertise in the same exact procedure and technique.

## 2018-10-13 NOTE — Patient Instructions (Signed)

## 2018-10-14 ENCOUNTER — Telehealth: Payer: Self-pay | Admitting: *Deleted

## 2018-10-14 DIAGNOSIS — R0602 Shortness of breath: Secondary | ICD-10-CM | POA: Insufficient documentation

## 2018-10-14 NOTE — Telephone Encounter (Signed)
Attempted to call for post procedure follow-up. No answer, unable to leave a message. 

## 2018-11-09 ENCOUNTER — Other Ambulatory Visit: Payer: Self-pay | Admitting: Family Medicine

## 2018-11-09 DIAGNOSIS — Z780 Asymptomatic menopausal state: Secondary | ICD-10-CM

## 2018-11-16 ENCOUNTER — Other Ambulatory Visit: Payer: Self-pay

## 2018-11-16 ENCOUNTER — Encounter: Payer: Self-pay | Admitting: Student in an Organized Health Care Education/Training Program

## 2018-11-16 ENCOUNTER — Ambulatory Visit
Payer: Medicare Other | Attending: Student in an Organized Health Care Education/Training Program | Admitting: Student in an Organized Health Care Education/Training Program

## 2018-11-16 VITALS — BP 107/75 | HR 66 | Temp 98.2°F | Resp 18 | Ht 68.0 in | Wt 157.0 lb

## 2018-11-16 DIAGNOSIS — M5136 Other intervertebral disc degeneration, lumbar region: Secondary | ICD-10-CM | POA: Diagnosis present

## 2018-11-16 DIAGNOSIS — M5416 Radiculopathy, lumbar region: Secondary | ICD-10-CM | POA: Diagnosis present

## 2018-11-16 DIAGNOSIS — G8929 Other chronic pain: Secondary | ICD-10-CM | POA: Insufficient documentation

## 2018-11-16 DIAGNOSIS — M5126 Other intervertebral disc displacement, lumbar region: Secondary | ICD-10-CM | POA: Insufficient documentation

## 2018-11-16 DIAGNOSIS — M47818 Spondylosis without myelopathy or radiculopathy, sacral and sacrococcygeal region: Secondary | ICD-10-CM

## 2018-11-16 DIAGNOSIS — G894 Chronic pain syndrome: Secondary | ICD-10-CM | POA: Diagnosis present

## 2018-11-16 DIAGNOSIS — M533 Sacrococcygeal disorders, not elsewhere classified: Secondary | ICD-10-CM | POA: Diagnosis present

## 2018-11-16 NOTE — Patient Instructions (Signed)
GENERAL RISKS AND COMPLICATIONS  What are the risk, side effects and possible complications? Generally speaking, most procedures are safe.  However, with any procedure there are risks, side effects, and the possibility of complications.  The risks and complications are dependent upon the sites that are lesioned, or the type of nerve block to be performed.  The closer the procedure is to the spine, the more serious the risks are.  Great care is taken when placing the radio frequency needles, block needles or lesioning probes, but sometimes complications can occur. 1. Infection: Any time there is an injection through the skin, there is a risk of infection.  This is why sterile conditions are used for these blocks.  There are four possible types of infection. 1. Localized skin infection. 2. Central Nervous System Infection-This can be in the form of Meningitis, which can be deadly. 3. Epidural Infections-This can be in the form of an epidural abscess, which can cause pressure inside of the spine, causing compression of the spinal cord with subsequent paralysis. This would require an emergency surgery to decompress, and there are no guarantees that the patient would recover from the paralysis. 4. Discitis-This is an infection of the intervertebral discs.  It occurs in about 1% of discography procedures.  It is difficult to treat and it may lead to surgery.        2. Pain: the needles have to go through skin and soft tissues, will cause soreness.       3. Damage to internal structures:  The nerves to be lesioned may be near blood vessels or    other nerves which can be potentially damaged.       4. Bleeding: Bleeding is more common if the patient is taking blood thinners such as  aspirin, Coumadin, Ticiid, Plavix, etc., or if he/she have some genetic predisposition  such as hemophilia. Bleeding into the spinal canal can cause compression of the spinal  cord with subsequent paralysis.  This would require an  emergency surgery to  decompress and there are no guarantees that the patient would recover from the  paralysis.       5. Pneumothorax:  Puncturing of a lung is a possibility, every time a needle is introduced in  the area of the chest or upper back.  Pneumothorax refers to free air around the  collapsed lung(s), inside of the thoracic cavity (chest cavity).  Another two possible  complications related to a similar event would include: Hemothorax and Chylothorax.   These are variations of the Pneumothorax, where instead of air around the collapsed  lung(s), you may have blood or chyle, respectively.       6. Spinal headaches: They may occur with any procedures in the area of the spine.       7. Persistent CSF (Cerebro-Spinal Fluid) leakage: This is a rare problem, but may occur  with prolonged intrathecal or epidural catheters either due to the formation of a fistulous  track or a dural tear.       8. Nerve damage: By working so close to the spinal cord, there is always a possibility of  nerve damage, which could be as serious as a permanent spinal cord injury with  paralysis.       9. Death:  Although rare, severe deadly allergic reactions known as "Anaphylactic  reaction" can occur to any of the medications used.      10. Worsening of the symptoms:  We can always make thing worse.    What are the chances of something like this happening? Chances of any of this occuring are extremely low.  By statistics, you have more of a chance of getting killed in a motor vehicle accident: while driving to the hospital than any of the above occurring .  Nevertheless, you should be aware that they are possibilities.  In general, it is similar to taking a shower.  Everybody knows that you can slip, hit your head and get killed.  Does that mean that you should not shower again?  Nevertheless always keep in mind that statistics do not mean anything if you happen to be on the wrong side of them.  Even if a procedure has a 1  (one) in a 1,000,000 (million) chance of going wrong, it you happen to be that one..Also, keep in mind that by statistics, you have more of a chance of having something go wrong when taking medications.  Who should not have this procedure? If you are on a blood thinning medication (e.g. Coumadin, Plavix, see list of "Blood Thinners"), or if you have an active infection going on, you should not have the procedure.  If you are taking any blood thinners, please inform your physician.  How should I prepare for this procedure?  Do not eat or drink anything at least six hours prior to the procedure.  Bring a driver with you .  It cannot be a taxi.  Come accompanied by an adult that can drive you back, and that is strong enough to help you if your legs get weak or numb from the local anesthetic.  Take all of your medicines the morning of the procedure with just enough water to swallow them.  If you have diabetes, make sure that you are scheduled to have your procedure done first thing in the morning, whenever possible.  If you have diabetes, take only half of your insulin dose and notify our nurse that you have done so as soon as you arrive at the clinic.  If you are diabetic, but only take blood sugar pills (oral hypoglycemic), then do not take them on the morning of your procedure.  You may take them after you have had the procedure.  Do not take aspirin or any aspirin-containing medications, at least eleven (11) days prior to the procedure.  They may prolong bleeding.  Wear loose fitting clothing that may be easy to take off and that you would not mind if it got stained with Betadine or blood.  Do not wear any jewelry or perfume  Remove any nail coloring.  It will interfere with some of our monitoring equipment.  NOTE: Remember that this is not meant to be interpreted as a complete list of all possible complications.  Unforeseen problems may occur.  BLOOD THINNERS The following drugs  contain aspirin or other products, which can cause increased bleeding during surgery and should not be taken for 2 weeks prior to and 1 week after surgery.  If you should need take something for relief of minor pain, you may take acetaminophen which is found in Tylenol,m Datril, Anacin-3 and Panadol. It is not blood thinner. The products listed below are.  Do not take any of the products listed below in addition to any listed on your instruction sheet.  A.P.C or A.P.C with Codeine Codeine Phosphate Capsules #3 Ibuprofen Ridaura  ABC compound Congesprin Imuran rimadil  Advil Cope Indocin Robaxisal  Alka-Seltzer Effervescent Pain Reliever and Antacid Coricidin or Coricidin-D  Indomethacin Rufen    Alka-Seltzer plus Cold Medicine Cosprin Ketoprofen S-A-C Tablets  Anacin Analgesic Tablets or Capsules Coumadin Korlgesic Salflex  Anacin Extra Strength Analgesic tablets or capsules CP-2 Tablets Lanoril Salicylate  Anaprox Cuprimine Capsules Levenox Salocol  Anexsia-D Dalteparin Magan Salsalate  Anodynos Darvon compound Magnesium Salicylate Sine-off  Ansaid Dasin Capsules Magsal Sodium Salicylate  Anturane Depen Capsules Marnal Soma  APF Arthritis pain formula Dewitt's Pills Measurin Stanback  Argesic Dia-Gesic Meclofenamic Sulfinpyrazone  Arthritis Bayer Timed Release Aspirin Diclofenac Meclomen Sulindac  Arthritis pain formula Anacin Dicumarol Medipren Supac  Analgesic (Safety coated) Arthralgen Diffunasal Mefanamic Suprofen  Arthritis Strength Bufferin Dihydrocodeine Mepro Compound Suprol  Arthropan liquid Dopirydamole Methcarbomol with Aspirin Synalgos  ASA tablets/Enseals Disalcid Micrainin Tagament  Ascriptin Doan's Midol Talwin  Ascriptin A/D Dolene Mobidin Tanderil  Ascriptin Extra Strength Dolobid Moblgesic Ticlid  Ascriptin with Codeine Doloprin or Doloprin with Codeine Momentum Tolectin  Asperbuf Duoprin Mono-gesic Trendar  Aspergum Duradyne Motrin or Motrin IB Triminicin  Aspirin  plain, buffered or enteric coated Durasal Myochrisine Trigesic  Aspirin Suppositories Easprin Nalfon Trillsate  Aspirin with Codeine Ecotrin Regular or Extra Strength Naprosyn Uracel  Atromid-S Efficin Naproxen Ursinus  Auranofin Capsules Elmiron Neocylate Vanquish  Axotal Emagrin Norgesic Verin  Azathioprine Empirin or Empirin with Codeine Normiflo Vitamin E  Azolid Emprazil Nuprin Voltaren  Bayer Aspirin plain, buffered or children's or timed BC Tablets or powders Encaprin Orgaran Warfarin Sodium  Buff-a-Comp Enoxaparin Orudis Zorpin  Buff-a-Comp with Codeine Equegesic Os-Cal-Gesic   Buffaprin Excedrin plain, buffered or Extra Strength Oxalid   Bufferin Arthritis Strength Feldene Oxphenbutazone   Bufferin plain or Extra Strength Feldene Capsules Oxycodone with Aspirin   Bufferin with Codeine Fenoprofen Fenoprofen Pabalate or Pabalate-SF   Buffets II Flogesic Panagesic   Buffinol plain or Extra Strength Florinal or Florinal with Codeine Panwarfarin   Buf-Tabs Flurbiprofen Penicillamine   Butalbital Compound Four-way cold tablets Penicillin   Butazolidin Fragmin Pepto-Bismol   Carbenicillin Geminisyn Percodan   Carna Arthritis Reliever Geopen Persantine   Carprofen Gold's salt Persistin   Chloramphenicol Goody's Phenylbutazone   Chloromycetin Haltrain Piroxlcam   Clmetidine heparin Plaquenil   Cllnoril Hyco-pap Ponstel   Clofibrate Hydroxy chloroquine Propoxyphen         Before stopping any of these medications, be sure to consult the physician who ordered them.  Some, such as Coumadin (Warfarin) are ordered to prevent or treat serious conditions such as "deep thrombosis", "pumonary embolisms", and other heart problems.  The amount of time that you may need off of the medication may also vary with the medication and the reason for which you were taking it.  If you are taking any of these medications, please make sure you notify your pain physician before you undergo any  procedures.         Sacroiliac (SI) Joint Injection Patient Information  Description: The sacroiliac joint connects the scrum (very low back and tailbone) to the ilium (a pelvic bone which also forms half of the hip joint).  Normally this joint experiences very little motion.  When this joint becomes inflamed or unstable low back and or hip and pelvis pain may result.  Injection of this joint with local anesthetics (numbing medicines) and steroids can provide diagnostic information and reduce pain.  This injection is performed with the aid of x-ray guidance into the tailbone area while you are lying on your stomach.   You may experience an electrical sensation down the leg while this is being done.  You may also experience numbness.  We   also may ask if we are reproducing your normal pain during the injection.  Conditions which may be treated SI injection:   Low back, buttock, hip or leg pain  Preparation for the Injection:  1. Do not eat any solid food or dairy products within 8 hours of your appointment.  2. You may drink clear liquids up to 3 hours before appointment.  Clear liquids include water, black coffee, juice or soda.  No milk or cream please. 3. You may take your regular medications, including pain medications with a sip of water before your appointment.  Diabetics should hold regular insulin (if take separately) and take 1/2 normal NPH dose the morning of the procedure.  Carry some sugar containing items with you to your appointment. 4. A driver must accompany you and be prepared to drive you home after your procedure. 5. Bring all of your current medications with you. 6. An IV may be inserted and sedation may be given at the discretion of the physician. 7. A blood pressure cuff, EKG and other monitors will often be applied during the procedure.  Some patients may need to have extra oxygen administered for a short period.  8. You will be asked to provide medical information,  including your allergies, prior to the procedure.  We must know immediately if you are taking blood thinners (like Coumadin/Warfarin) or if you are allergic to IV iodine contrast (dye).  We must know if you could possible be pregnant.  Possible side effects:   Bleeding from needle site  Infection (rare, may require surgery)  Nerve injury (rare)  Numbness & tingling (temporary)  A brief convulsion or seizure  Light-headedness (temporary)  Pain at injection site (several days)  Decreased blood pressure (temporary)  Weakness in the leg (temporary)   Call if you experience:   New onset weakness or numbness of an extremity below the injection site that last more than 8 hours.  Hives or difficulty breathing ( go to the emergency room)  Inflammation or drainage at the injection site  Any new symptoms which are concerning to you  Please note:  Although the local anesthetic injected can often make your back/ hip/ buttock/ leg feel good for several hours after the injections, the pain will likely return.  It takes 3-7 days for steroids to work in the sacroiliac area.  You may not notice any pain relief for at least that one week.  If effective, we will often do a series of three injections spaced 3-6 weeks apart to maximally decrease your pain.  After the initial series, we generally will wait some months before a repeat injection of the same type.  If you have any questions, please call [phone] [facility]   

## 2018-11-16 NOTE — Progress Notes (Signed)
Safety precautions to be maintained throughout the outpatient stay will include: orient to surroundings, keep bed in low position, maintain call bell within reach at all times, provide assistance with transfer out of bed and ambulation.  

## 2018-11-16 NOTE — Progress Notes (Signed)
Patient's Name: Romelle Muldoon  MRN: [id number]  Referring Provider: Hortencia Pilar, MD  DOB: [birth year]  PCP: Hortencia Pilar, MD  DOS: 11/16/2018  Note by: Gillis Santa, MD  Service setting: Ambulatory outpatient  Specialty: Interventional Pain Management  Location: [facility] (AMB) Pain Management Facility    Patient type: Established   Primary Reason(s) for Visit: Encounter for post-procedure evaluation of chronic illness with mild to moderate exacerbation CC: Back Pain (lower)  HPI  Ms. Vallo is a 85 y.o. year old, female patient, who comes today for a post-procedure evaluation. She has Closed fracture of fifth toe of right foot; Lumbar facet arthropathy; Lumbar radiculopathy; Lumbar disc herniation (L2/L3); Lumbar degenerative disc disease; Chronic pain syndrome; and Neuropathy on their problem list. Her primarily concern today is the Back Pain (lower)  Pain Assessment: Location: Lower Back Radiating: up to mid back and sometimes, when pt bends over, down side of left thigh stopping above knee; NEW issue-pain sometimes moves to right buttock Onset: More than a month ago Duration: Chronic pain Quality: Discomfort Severity: 2 /10 (subjective, self-reported pain score)  Note: Reported level is compatible with observation.                         When using our objective Pain Scale, levels between 6 and 10/10 are said to belong in an emergency room, as it progressively worsens from a 6/10, described as severely limiting, requiring emergency care not usually available at an outpatient pain management facility. At a 6/10 level, communication becomes difficult and requires great effort. Assistance to reach the emergency department may be required. Facial flushing and profuse sweating along with potentially dangerous increases in heart rate and blood pressure will be evident. Effect on ADL: sometimes limits daily activities Timing: Intermittent(gets worse during activity) Modifying factors:  rest BP: 107/75  HR: 66  Ms. Schmader comes in today for post-procedure evaluation.  Further details on both, my assessment(s), as well as the proposed treatment plan, please see below.  Post-Procedure Assessment  10/13/2018 Procedure: Left L2-L3 ESI #3  Influential Factors: BMI: 23.87 kg/m Intra-procedural challenges: None observed.         Assessment challenges: None detected.              Reported side-effects: None.        Post-procedural adverse reactions or complications: None reported         Sedation: Please see nurses note. When no sedatives are used, the analgesic levels obtained are directly associated to the effectiveness of the local anesthetics. However, when sedation is provided, the level of analgesia obtained during the initial 1 hour following the intervention, is believed to be the result of a combination of factors. These factors may include, but are not limited to: 1. The effectiveness of the local anesthetics used. 2. The effects of the analgesic(s) and/or anxiolytic(s) used. 3. The degree of discomfort experienced by the patient at the time of the procedure. 4. The patients ability and reliability in recalling and recording the events. 5. The presence and influence of possible secondary gains and/or psychosocial factors. Reported result: Relief experienced during the 1st hour after the procedure: 100 % (Ultra-Short Term Relief)            Interpretative annotation: Clinically appropriate result. Analgesia during this period is likely to be Local Anesthetic and/or IV Sedative (Analgesic/Anxiolytic) related.          Effects of local anesthetic: The analgesic effects attained during  this period are directly associated to the localized infiltration of local anesthetics and therefore cary significant diagnostic value as to the etiological location, or anatomical origin, of the pain. Expected duration of relief is directly dependent on the pharmacodynamics of the local  anesthetic used. Long-acting (4-6 hours) anesthetics used.  Reported result: Relief during the next 4 to 6 hour after the procedure: 100 % (Short-Term Relief)            Interpretative annotation: Clinically appropriate result. Analgesia during this period is likely to be Local Anesthetic-related.          Long-term benefit: Defined as the period of time past the expected duration of local anesthetics (1 hour for short-acting and 4-6 hours for long-acting). With the possible exception of prolonged sympathetic blockade from the local anesthetics, benefits during this period are typically attributed to, or associated with, other factors such as analgesic sensory neuropraxia, antiinflammatory effects, or beneficial biochemical changes provided by agents other than the local anesthetics.  Reported result: Extended relief following procedure: 100 %(X 4 days then 75%) (Long-Term Relief)            Interpretative annotation: Clinically possible results. Good relief. No permanent benefit expected. Inflammation plays a part in the etiology to the pain.          Current benefits: Defined as reported results that persistent at this point in time.   Analgesia: 50-75 %            Function: Somewhat improved ROM: Somewhat improved Interpretative annotation: Ongoing benefit. No permanent benefit expected. Effective diagnostic intervention.          Interpretation: Results would suggest a successful therapeutic intervention.                  Plan:  Please see "Plan of Care" for details.                Laboratory Chemistry  Inflammation Markers (CRP: Acute Phase) (ESR: Chronic Phase) No results found for: CRP, ESRSEDRATE, LATICACIDVEN                       Rheumatology Markers No results found for: RF, ANA, LABURIC, URICUR, LYMEIGGIGMAB, LYMEABIGMQN, HLAB27                      Renal Function Markers Lab Results  Component Value Date   BUN 23 (H) 09/14/2015   CREATININE 0.80 09/14/2015   GFRAA >60  09/14/2015   GFRNONAA >60 09/14/2015                             Hepatic Function Markers Lab Results  Component Value Date   AST 19 11/26/2015   ALT 15 11/26/2015   ALBUMIN 4.7 11/26/2015   ALKPHOS 64 11/26/2015                        Electrolytes Lab Results  Component Value Date   NA 141 09/14/2015   K 3.5 09/14/2015   CL 107 09/14/2015   CALCIUM 9.7 09/14/2015   MG 2.2 09/14/2015                        Neuropathy Markers No results found for: VITAMINB12, FOLATE, HGBA1C, HIV  CNS Tests No results found for: COLORCSF, APPEARCSF, RBCCOUNTCSF, WBCCSF, POLYSCSF, LYMPHSCSF, EOSCSF, PROTEINCSF, GLUCCSF, JCVIRUS, CSFOLI, IGGCSF                      Bone Pathology Markers No results found for: VD25OH, [id number], [id number], [id number], 25OHVITD1, 25OHVITD2, 25OHVITD3, TESTOFREE, TESTOSTERONE                       Coagulation Parameters Lab Results  Component Value Date   INR 1.08 09/14/2015   LABPROT 14.2 09/14/2015   APTT 31 09/14/2015   PLT 177 09/14/2015                        Cardiovascular Markers Lab Results  Component Value Date   CKTOTAL 66 02/12/2013   CKMB < 0.5 (L) 02/12/2013   TROPONINI <0.03 09/14/2015   HGB 13.2 09/14/2015   HCT 38.3 09/14/2015                         CA Markers No results found for: CEA, CA125, LABCA2                      Endocrine Markers Lab Results  Component Value Date   TSH 6.05 (H) 02/15/2013                        Note: Lab results reviewed.  Recent Diagnostic Imaging Results  DG C-Arm 1-60 Min-No Report Fluoroscopy was utilized by the requesting physician.  No radiographic  interpretation.   Complexity Note: Imaging results reviewed. Results shared with Ms. Bristow, using Layman's terms.                         Meds   Current Outpatient Medications:  .  Apoaequorin (PREVAGEN PO), Take by mouth once., Disp: , Rfl:  .  beta carotene w/minerals (OCUVITE) tablet, Take 1 tablet by mouth daily.,  Disp: , Rfl:  .  dicyclomine (BENTYL) 10 MG capsule, Take 10 mg by mouth 4 (four) times daily -  before meals and at bedtime., Disp: , Rfl:  .  hydrOXYzine (ATARAX/VISTARIL) 10 MG tablet, Take 1 tablet by mouth as needed., Disp: , Rfl:  .  levothyroxine (SYNTHROID, LEVOTHROID) 100 MCG tablet, TAKE 1 TABLET BY MOUTH ONCE DAILY ON EMPTY STOMACH WITH GLAS OF WATER 30-60MIN BEFORE BREAKFAST, Disp: , Rfl: 11 .  lovastatin (MEVACOR) 20 MG tablet, Take 20 mg by mouth at bedtime., Disp: , Rfl:  .  LUMIGAN 0.01 % SOLN, Place 1 drop into the left eye at bedtime., Disp: , Rfl:  .  Melatonin 1 MG TABS, Take 10 mg by mouth once., Disp: , Rfl:  .  pregabalin (LYRICA) 100 MG capsule, Take 100 mg by mouth 3 (three) times daily., Disp: , Rfl:  .  sertraline (ZOLOFT) 50 MG tablet, Take 1 tablet by mouth daily., Disp: , Rfl:  .  simvastatin (ZOCOR) 10 MG tablet, Take 10 mg by mouth at bedtime., Disp: , Rfl:  .  SYNTHROID 112 MCG tablet, Take 1 tablet by mouth daily. , Disp: , Rfl:  .  aspirin EC 81 MG tablet, Take 1 tablet by mouth daily., Disp: , Rfl:  .  gabapentin (NEURONTIN) 100 MG capsule, Take 1 capsule by mouth at bedtime., Disp: , Rfl:  .  montelukast (SINGULAIR) 10 MG tablet, Take  10 mg by mouth at bedtime., Disp: , Rfl:  .  phenazopyridine (PYRIDIUM) 97 MG tablet, Take 97 mg by mouth once., Disp: , Rfl:  .  RESTASIS 0.05 % ophthalmic emulsion, Apply 1 drop to eye 2 (two) times daily., Disp: , Rfl:   ROS  Constitutional: Denies any fever or chills Gastrointestinal: No reported hemesis, hematochezia, vomiting, or acute GI distress Musculoskeletal: Denies any acute onset joint swelling, redness, loss of ROM, or weakness Neurological: No reported episodes of acute onset apraxia, aphasia, dysarthria, agnosia, amnesia, paralysis, loss of coordination, or loss of consciousness  Allergies  Ms. Tovey is allergic to codeine; hydrocodone; peanuts [peanut oil]; penicillins; tramadol; and vitamin d  analogs.  [place]  Drug: Ms. Yerkes  has no history on file for drug. Alcohol:  reports no history of alcohol use. Tobacco:  reports that she has never smoked. She has never used smokeless tobacco. Medical:  has a past medical history of Cancer ([place]), Seizures ([place]), and Thyroid disease. Surgical: Ms. Herne  has a past surgical history that includes Abdominal hysterectomy and Breast biopsy (Bilateral). Family: family history includes Brain cancer in her sister; Breast cancer in her maternal aunt; Breast cancer (age of onset: 9) in her maternal aunt; Breast cancer (age of onset: 47) in her mother; Breast cancer (age of onset: 93) in her maternal uncle; Cancer in her brother and sister; Tracheal cancer in her father.  Constitutional Exam  General appearance: Well nourished, well developed, and well hydrated. In no apparent acute distress Vitals:   11/16/18 1028  BP: 107/75  Pulse: 66  Resp: 18  Temp: 98.2 F (36.8 C)  TempSrc: Oral  SpO2: 100%  Weight: 157 lb (71.2 kg)  Height: _0  (1.727 m)   BMI Assessment: Estimated body mass index is 23.87 kg/m as calculated from the following:   Height as of this encounter: _1  (1.727 m).   Weight as of this encounter: 157 lb (71.2 kg).  BMI interpretation table: BMI level Category Range association with higher incidence of chronic pain  <18 kg/m2 Underweight   18.5-24.9 kg/m2 Ideal body weight   25-29.9 kg/m2 Overweight Increased incidence by 20%  30-34.9 kg/m2 Obese (Class I) Increased incidence by 68%  35-39.9 kg/m2 Severe obesity (Class II) Increased incidence by 136%  >40 kg/m2 Extreme obesity (Class III) Increased incidence by 254%   Patient's current BMI Ideal Body weight  Body mass index is 23.87 kg/m. Ideal body weight: 63.9 kg (140 lb 14 oz) Adjusted ideal body weight: 66.8 kg (147 lb 5.2 oz)   BMI Readings from Last 4 Encounters:  11/16/18 23.87 kg/m  10/13/18 24.48 kg/m  10/06/18 24.33 kg/m  09/08/18 24.33 kg/m    Wt Readings from Last 4 Encounters:  11/16/18 157 lb (71.2 kg)  10/13/18 161 lb (73 kg)  10/06/18 160 lb (72.6 kg)  09/08/18 160 lb (72.6 kg)  Psych/Mental status: Alert, oriented x 3 (person, place, & time)       Eyes: PERLA Respiratory: No evidence of acute respiratory distress  Cervical Spine Area Exam  Skin & Axial Inspection: No masses, redness, edema, swelling, or associated skin lesions Alignment: Symmetrical Functional ROM: Unrestricted ROM      Stability: No instability detected Muscle Tone/Strength: Functionally intact. No obvious neuro-muscular anomalies detected. Sensory (Neurological): Unimpaired Palpation: No palpable anomalies              Upper Extremity (UE) Exam    Side: Right upper extremity  Side: Left upper extremity  Skin & Extremity Inspection: Skin color, temperature, and hair growth are WNL. No peripheral edema or cyanosis. No masses, redness, swelling, asymmetry, or associated skin lesions. No contractures.  Skin & Extremity Inspection: Skin color, temperature, and hair growth are WNL. No peripheral edema or cyanosis. No masses, redness, swelling, asymmetry, or associated skin lesions. No contractures.  Functional ROM: Unrestricted ROM          Functional ROM: Unrestricted ROM          Muscle Tone/Strength: Functionally intact. No obvious neuro-muscular anomalies detected.  Muscle Tone/Strength: Functionally intact. No obvious neuro-muscular anomalies detected.  Sensory (Neurological): Unimpaired          Sensory (Neurological): Unimpaired          Palpation: No palpable anomalies              Palpation: No palpable anomalies              Provocative Test(s):  Phalen's test: deferred Tinel's test: deferred Apley's scratch test (touch opposite shoulder):  Action 1 (Across chest): deferred Action 2 (Overhead): deferred Action 3 (LB reach): deferred   Provocative Test(s):  Phalen's test: deferred Tinel's test: deferred Apley's scratch test (touch opposite  shoulder):  Action 1 (Across chest): deferred Action 2 (Overhead): deferred Action 3 (LB reach): deferred    Thoracic Spine Area Exam  Skin & Axial Inspection: No masses, redness, or swelling Alignment: Symmetrical Functional ROM: Unrestricted ROM Stability: No instability detected Muscle Tone/Strength: Functionally intact. No obvious neuro-muscular anomalies detected. Sensory (Neurological): Unimpaired Muscle strength & Tone: No palpable anomalies  Lumbar Spine Area Exam  Skin & Axial Inspection: No masses, redness, or swelling Alignment: Symmetrical Functional ROM: Improved after treatment       Stability: No instability detected Muscle Tone/Strength: Functionally intact. No obvious neuro-muscular anomalies detected. Sensory (Neurological): Musculoskeletal pain pattern Palpation: No palpable anomalies       Provocative Tests: Hyperextension/rotation test: (+) bilaterally for facet joint pain. Lumbar quadrant test (Kemp's test): (+) bilaterally for facet joint pain. Lateral bending test: (+) due to pain. Patrick's Maneuver: deferred today                   FABER* test: deferred today                   S-I anterior distraction/compression test: deferred today         S-I lateral compression test: deferred today         S-I Thigh-thrust test: deferred today         S-I Gaenslen's test: deferred today         *(Flexion, ABduction and External Rotation)  Gait & Posture Assessment  Ambulation: Unassisted Gait: Relatively normal for age and body habitus Posture: WNL   Lower Extremity Exam    Side: Right lower extremity  Side: Left lower extremity  Stability: No instability observed          Stability: No instability observed          Skin & Extremity Inspection: Skin color, temperature, and hair growth are WNL. No peripheral edema or cyanosis. No masses, redness, swelling, asymmetry, or associated skin lesions. No contractures.  Skin & Extremity Inspection: Skin color,  temperature, and hair growth are WNL. No peripheral edema or cyanosis. No masses, redness, swelling, asymmetry, or associated skin lesions. No contractures.  Functional ROM: Unrestricted ROM  Functional ROM: Unrestricted ROM                  Muscle Tone/Strength: Functionally intact. No obvious neuro-muscular anomalies detected.  Muscle Tone/Strength: Functionally intact. No obvious neuro-muscular anomalies detected.  Sensory (Neurological): Unimpaired        Sensory (Neurological): Unimpaired        DTR: Patellar: deferred today Achilles: deferred today Plantar: deferred today  DTR: Patellar: deferred today Achilles: deferred today Plantar: deferred today  Palpation: No palpable anomalies  Palpation: No palpable anomalies   Assessment   Status Diagnosis  Having a Flare-up Having a Flare-up Responding 1. Chronic SI joint pain   2. SI joint arthritis   3. Lumbar radiculopathy   4. Lumbar disc herniation (L2/L3)   5. Chronic pain syndrome   6. Lumbar degenerative disc disease      Patient follows up after lumbar epidural steroid injection #3 at left L2-3 for L2-L3 disc herniation which is helped out with the patient's radiating pain.  She is now complaining of pain lower inner buttocks with occasional radiation into her groin.  This could be associated with SI joint dysfunction and arthropathy.  We discussed diagnostic sacroiliac joint injections.  Risks and benefits of this procedure were discussed and patient would like to proceed.  Plan of Care  Lab-work, procedure(s), and/or referral(s): Orders Placed This Encounter  Procedures  . SACROILIAC JOINT INJECTION    Interventional history: L2-L3 lumbar ESI: 07/26/2018, 09/08/2018, 10/13/2018 effective for her left radicular symptoms.  Can consider repeating in the future.   Considering:   Bilateral sacroiliac joint injection Repeat L2-L3 lumbar ESI   PRN Procedures:   To be determined at a later time   Time  Note: Greater than 50% of the 25 minute(s) of face-to-face time spent with Ms. Storlie, was spent in counseling/coordination of care regarding: Ms. Brickman primary cause of pain, the treatment plan, treatment alternatives, the risks and possible complications of proposed treatment, going over the informed consent, the results, interpretation and significance of  her recent diagnostic interventional treatment(s), the appropriate use of her medications, realistic expectations and the goals of pain management (increased in functionality).  Provider-requested follow-up: Return in about 2 weeks (around 11/30/2018) for Procedure.  Future Appointments  Date Time Provider [place]  12/01/2018  9:45 AM Gillis Santa, MD [facility] None    Primary Care Physician: Hortencia Pilar, MD Location: [facility] Outpatient Pain Management Facility Note by: Gillis Santa, M.D Date: 11/16/2018; Time: 1:30 PM  Patient Instructions   GENERAL RISKS AND COMPLICATIONS  What are the risk, side effects and possible complications? Generally speaking, most procedures are safe.  However, with any procedure there are risks, side effects, and the possibility of complications.  The risks and complications are dependent upon the sites that are lesioned, or the type of nerve block to be performed.  The closer the procedure is to the spine, the more serious the risks are.  Great care is taken when placing the radio frequency needles, block needles or lesioning probes, but sometimes complications can occur. 1. Infection: Any time there is an injection through the skin, there is a risk of infection.  This is why sterile conditions are used for these blocks.  There are four possible types of infection. 1. Localized skin infection. 2. Central Nervous System Infection-This can be in the form of Meningitis, which can be deadly. 3. Epidural Infections-This can be in the form of an epidural abscess, which can cause pressure inside of the  spine, causing compression of the spinal cord with subsequent paralysis. This would require an emergency surgery to decompress, and there are no guarantees that the patient would recover from the paralysis. 4. Discitis-This is an infection of the intervertebral discs.  It occurs in about 1% of discography procedures.  It is difficult to treat and it may lead to surgery.        2. Pain: the needles have to go through skin and soft tissues, will cause soreness.       3. Damage to internal structures:  The nerves to be lesioned may be near blood vessels or    other nerves which can be potentially damaged.       4. Bleeding: Bleeding is more common if the patient is taking blood thinners such as  aspirin, Coumadin, Ticiid, Plavix, etc., or if he/she have some genetic predisposition  such as hemophilia. Bleeding into the spinal canal can cause compression of the spinal  cord with subsequent paralysis.  This would require an emergency surgery to  decompress and there are no guarantees that the patient would recover from the  paralysis.       5. Pneumothorax:  Puncturing of a lung is a possibility, every time a needle is introduced in  the area of the chest or upper back.  Pneumothorax refers to free air around the  collapsed lung(s), inside of the thoracic cavity (chest cavity).  Another two possible  complications related to a similar event would include: Hemothorax and Chylothorax.   These are variations of the Pneumothorax, where instead of air around the collapsed  lung(s), you may have blood or chyle, respectively.       6. Spinal headaches: They may occur with any procedures in the area of the spine.       7. Persistent CSF (Cerebro-Spinal Fluid) leakage: This is a rare problem, but may occur  with prolonged intrathecal or epidural catheters either due to the formation of a fistulous  track or a dural tear.       8. Nerve damage: By working so close to the spinal cord, there is always a possibility of   nerve damage, which could be as serious as a permanent spinal cord injury with  paralysis.       9. Death:  Although rare, severe deadly allergic reactions known as "Anaphylactic  reaction" can occur to any of the medications used.      10. Worsening of the symptoms:  We can always make thing worse.  What are the chances of something like this happening? Chances of any of this occuring are extremely low.  By statistics, you have more of a chance of getting killed in a motor vehicle accident: while driving to the hospital than any of the above occurring .  Nevertheless, you should be aware that they are possibilities.  In general, it is similar to taking a shower.  Everybody knows that you can slip, hit your head and get killed.  Does that mean that you should not shower again?  Nevertheless always keep in mind that statistics do not mean anything if you happen to be on the wrong side of them.  Even if a procedure has a 1 (one) in a 1,000,000 (million) chance of going wrong, it you happen to be that one..Also, keep in mind that by statistics, you have more of a chance of having something go wrong when taking medications.  Who should not have this procedure? If you are  on a blood thinning medication (e.g. Coumadin, Plavix, see list of "Blood Thinners"), or if you have an active infection going on, you should not have the procedure.  If you are taking any blood thinners, please inform your physician.  How should I prepare for this procedure?  Do not eat or drink anything at least six hours prior to the procedure.  Bring a driver with you .  It cannot be a taxi.  Come accompanied by an adult that can drive you back, and that is strong enough to help you if your legs get weak or numb from the local anesthetic.  Take all of your medicines the morning of the procedure with just enough water to swallow them.  If you have diabetes, make sure that you are scheduled to have your procedure done first thing  in the morning, whenever possible.  If you have diabetes, take only half of your insulin dose and notify our nurse that you have done so as soon as you arrive at the clinic.  If you are diabetic, but only take blood sugar pills (oral hypoglycemic), then do not take them on the morning of your procedure.  You may take them after you have had the procedure.  Do not take aspirin or any aspirin-containing medications, at least eleven (11) days prior to the procedure.  They may prolong bleeding.  Wear loose fitting clothing that may be easy to take off and that you would not mind if it got stained with Betadine or blood.  Do not wear any jewelry or perfume  Remove any nail coloring.  It will interfere with some of our monitoring equipment.  NOTE: Remember that this is not meant to be interpreted as a complete list of all possible complications.  Unforeseen problems may occur.  BLOOD THINNERS The following drugs contain aspirin or other products, which can cause increased bleeding during surgery and should not be taken for 2 weeks prior to and 1 week after surgery.  If you should need take something for relief of minor pain, you may take acetaminophen which is found in Tylenol,m Datril, Anacin-3 and Panadol. It is not blood thinner. The products listed below are.  Do not take any of the products listed below in addition to any listed on your instruction sheet.  A.P.C or A.P.C with Codeine Codeine Phosphate Capsules #3 Ibuprofen Ridaura  ABC compound Congesprin Imuran rimadil  Advil Cope Indocin Robaxisal  Alka-Seltzer Effervescent Pain Reliever and Antacid Coricidin or Coricidin-D  Indomethacin Rufen  Alka-Seltzer plus Cold Medicine Cosprin Ketoprofen S-A-C Tablets  Anacin Analgesic Tablets or Capsules Coumadin Korlgesic Salflex  Anacin Extra Strength Analgesic tablets or capsules CP-2 Tablets Lanoril Salicylate  Anaprox Cuprimine Capsules Levenox Salocol  Anexsia-D Dalteparin Magan Salsalate   Anodynos Darvon compound Magnesium Salicylate Sine-off  Ansaid Dasin Capsules Magsal Sodium Salicylate  Anturane Depen Capsules Marnal Soma  APF Arthritis pain formula Dewitt's Pills Measurin Stanback  Argesic Dia-Gesic Meclofenamic Sulfinpyrazone  Arthritis Bayer Timed Release Aspirin Diclofenac Meclomen Sulindac  Arthritis pain formula Anacin Dicumarol Medipren Supac  Analgesic (Safety coated) Arthralgen Diffunasal Mefanamic Suprofen  Arthritis Strength Bufferin Dihydrocodeine Mepro Compound Suprol  Arthropan liquid Dopirydamole Methcarbomol with Aspirin Synalgos  ASA tablets/Enseals Disalcid Micrainin Tagament  Ascriptin Doan's Midol Talwin  Ascriptin A/D Dolene Mobidin Tanderil  Ascriptin Extra Strength Dolobid Moblgesic Ticlid  Ascriptin with Codeine Doloprin or Doloprin with Codeine Momentum Tolectin  Asperbuf Duoprin Mono-gesic Trendar  Aspergum Duradyne Motrin or Motrin IB Triminicin  Aspirin plain, buffered  or enteric coated Durasal Myochrisine Trigesic  Aspirin Suppositories Easprin Nalfon Trillsate  Aspirin with Codeine Ecotrin Regular or Extra Strength Naprosyn Uracel  Atromid-S Efficin Naproxen Ursinus  Auranofin Capsules Elmiron Neocylate Vanquish  Axotal Emagrin Norgesic Verin  Azathioprine Empirin or Empirin with Codeine Normiflo Vitamin E  Azolid Emprazil Nuprin Voltaren  Bayer Aspirin plain, buffered or children's or timed BC Tablets or powders Encaprin Orgaran Warfarin Sodium  Buff-a-Comp Enoxaparin Orudis Zorpin  Buff-a-Comp with Codeine Equegesic Os-Cal-Gesic   Buffaprin Excedrin plain, buffered or Extra Strength Oxalid   Bufferin Arthritis Strength Feldene Oxphenbutazone   Bufferin plain or Extra Strength Feldene Capsules Oxycodone with Aspirin   Bufferin with Codeine Fenoprofen Fenoprofen Pabalate or Pabalate-SF   Buffets II Flogesic Panagesic   Buffinol plain or Extra Strength Florinal or Florinal with Codeine Panwarfarin   Buf-Tabs Flurbiprofen  Penicillamine   Butalbital Compound Four-way cold tablets Penicillin   Butazolidin Fragmin Pepto-Bismol   Carbenicillin Geminisyn Percodan   Carna Arthritis Reliever Geopen Persantine   Carprofen Gold's salt Persistin   Chloramphenicol Goody's Phenylbutazone   Chloromycetin Haltrain Piroxlcam   Clmetidine heparin Plaquenil   Cllnoril Hyco-pap Ponstel   Clofibrate Hydroxy chloroquine Propoxyphen         Before stopping any of these medications, be sure to consult the physician who ordered them.  Some, such as Coumadin (Warfarin) are ordered to prevent or treat serious conditions such as "deep thrombosis", "pumonary embolisms", and other heart problems.  The amount of time that you may need off of the medication may also vary with the medication and the reason for which you were taking it.  If you are taking any of these medications, please make sure you notify your pain physician before you undergo any procedures.         Sacroiliac (SI) Joint Injection Patient Information  Description: The sacroiliac joint connects the scrum (very low back and tailbone) to the ilium (a pelvic bone which also forms half of the hip joint).  Normally this joint experiences very little motion.  When this joint becomes inflamed or unstable low back and or hip and pelvis pain may result.  Injection of this joint with local anesthetics (numbing medicines) and steroids can provide diagnostic information and reduce pain.  This injection is performed with the aid of x-ray guidance into the tailbone area while you are lying on your stomach.   You may experience an electrical sensation down the leg while this is being done.  You may also experience numbness.  We also may ask if we are reproducing your normal pain during the injection.  Conditions which may be treated SI injection:   Low back, buttock, hip or leg pain  Preparation for the Injection:  1. Do not eat any solid food or dairy products within 8 hours  of your appointment.  2. You may drink clear liquids up to 3 hours before appointment.  Clear liquids include water, black coffee, juice or soda.  No milk or cream please. 3. You may take your regular medications, including pain medications with a sip of water before your appointment.  Diabetics should hold regular insulin (if take separately) and take 1/2 normal NPH dose the morning of the procedure.  Carry some sugar containing items with you to your appointment. 4. A driver must accompany you and be prepared to drive you home after your procedure. 5. Bring all of your current medications with you. 6. An IV may be inserted and sedation may be given at  the discretion of the physician. 7. A blood pressure cuff, EKG and other monitors will often be applied during the procedure.  Some patients may need to have extra oxygen administered for a short period.  8. You will be asked to provide medical information, including your allergies, prior to the procedure.  We must know immediately if you are taking blood thinners (like Coumadin/Warfarin) or if you are allergic to IV iodine contrast (dye).  We must know if you could possible be pregnant.  Possible side effects:   Bleeding from needle site  Infection (rare, may require surgery)  Nerve injury (rare)  Numbness & tingling (temporary)  A brief convulsion or seizure  Light-headedness (temporary)  Pain at injection site (several days)  Decreased blood pressure (temporary)  Weakness in the leg (temporary)   Call if you experience:   New onset weakness or numbness of an extremity below the injection site that last more than 8 hours.  Hives or difficulty breathing ( go to the emergency room)  Inflammation or drainage at the injection site  Any new symptoms which are concerning to you  Please note:  Although the local anesthetic injected can often make your back/ hip/ buttock/ leg feel good for several hours after the injections, the  pain will likely return.  It takes 3-7 days for steroids to work in the sacroiliac area.  You may not notice any pain relief for at least that one week.  If effective, we will often do a series of three injections spaced 3-6 weeks apart to maximally decrease your pain.  After the initial series, we generally will wait some months before a repeat injection of the same type.  If you have any questions, please call [phone] [facility]

## 2018-12-01 ENCOUNTER — Other Ambulatory Visit: Payer: Self-pay

## 2018-12-01 ENCOUNTER — Ambulatory Visit
Admission: RE | Admit: 2018-12-01 | Discharge: 2018-12-01 | Disposition: A | Discharge status: Home / Self Care | Payer: Medicare Other | Source: Ambulatory Visit | Attending: Student in an Organized Health Care Education/Training Program | Admitting: Student in an Organized Health Care Education/Training Program

## 2018-12-01 ENCOUNTER — Encounter: Payer: Self-pay | Admitting: Student in an Organized Health Care Education/Training Program

## 2018-12-01 ENCOUNTER — Ambulatory Visit (HOSPITAL_BASED_OUTPATIENT_CLINIC_OR_DEPARTMENT_OTHER): Payer: Medicare Other | Admitting: Student in an Organized Health Care Education/Training Program

## 2018-12-01 VITALS — BP 110/68 | HR 66 | Temp 97.5°F | Resp 12 | Ht 68.0 in | Wt 151.0 lb

## 2018-12-01 DIAGNOSIS — G8929 Other chronic pain: Secondary | ICD-10-CM | POA: Insufficient documentation

## 2018-12-01 DIAGNOSIS — M533 Sacrococcygeal disorders, not elsewhere classified: Secondary | ICD-10-CM

## 2018-12-01 DIAGNOSIS — M47818 Spondylosis without myelopathy or radiculopathy, sacral and sacrococcygeal region: Secondary | ICD-10-CM | POA: Diagnosis present

## 2018-12-01 MED ORDER — DEXAMETHASONE SODIUM PHOSPHATE 10 MG/ML IJ SOLN
10.0000 mg | Freq: Once | INTRAMUSCULAR | Status: AC
  Administered 2018-12-01: 10 mg

## 2018-12-01 MED ORDER — LIDOCAINE HCL 2 % IJ SOLN
INTRAMUSCULAR | Status: AC
  Filled 2018-12-01: qty 20

## 2018-12-01 MED ORDER — LIDOCAINE HCL 2 % IJ SOLN
20.0000 mL | Freq: Once | INTRAMUSCULAR | Status: AC
  Administered 2018-12-01: 400 mg

## 2018-12-01 MED ORDER — ROPIVACAINE HCL 2 MG/ML IJ SOLN
9.0000 mL | Freq: Once | INTRAMUSCULAR | Status: AC
  Administered 2018-12-01: 10 mL via INTRA_ARTICULAR

## 2018-12-01 MED ORDER — DEXAMETHASONE SODIUM PHOSPHATE 10 MG/ML IJ SOLN
INTRAMUSCULAR | Status: AC
  Filled 2018-12-01: qty 2

## 2018-12-01 MED ORDER — IOPAMIDOL (ISOVUE-M 200) INJECTION 41%
10.0000 mL | Freq: Once | INTRAMUSCULAR | Status: AC
  Administered 2018-12-01: 10 mL via EPIDURAL
  Filled 2018-12-01: qty 10

## 2018-12-01 MED ORDER — ROPIVACAINE HCL 2 MG/ML IJ SOLN
INTRAMUSCULAR | Status: AC
  Filled 2018-12-01: qty 10

## 2018-12-01 NOTE — Progress Notes (Signed)
Safety precautions to be maintained throughout the outpatient stay will include: orient to surroundings, keep bed in low position, maintain call bell within reach at all times, provide assistance with transfer out of bed and ambulation.  

## 2018-12-01 NOTE — Patient Instructions (Signed)

## 2018-12-01 NOTE — Progress Notes (Signed)
Patient's Name: Sarah [name]  MRN: [id number]  Referring Provider: Hortencia Pilar, MD  DOB: 1932/08/28  PCP: Hortencia Pilar, MD  DOS: 12/01/2018  Note by: Gillis Santa, MD  Service setting: Ambulatory outpatient  Specialty: Interventional Pain Management  Patient type: Established  Location: [facility] (AMB) Pain Management Facility  Visit type: Interventional Procedure   Primary Reason for Visit: Interventional Pain Management Treatment. CC: Back Pain (lower)  Procedure:          Anesthesia, Analgesia, Anxiolysis:  Type: Diagnostic Sacroiliac Joint Steroid Injection          Region: Inferior Lumbosacral Region Level: PIIS (Posterior Inferior Iliac Spine) Laterality: Bilateral    Local Anesthetic: Lidocaine 1-2%  Position: Prone           Indications: 1. Chronic SI joint pain   2. SI joint arthritis    Pain Score: Pre-procedure: 0-No pain("gets worse as the day goes on")/10 Post-procedure: 0-No pain/10  Pre-op Assessment:  [name] is a 85 y.o. (year old), female patient, seen today for interventional treatment. She  has a past surgical history that includes Abdominal hysterectomy and Breast biopsy (Bilateral). [name] has a current medication list which includes the following prescription(s): apoaequorin, beta carotene w/minerals, dicyclomine, hydroxyzine, levothyroxine, lovastatin, lumigan, melatonin, montelukast, phenazopyridine, pregabalin, sertraline, simvastatin, synthroid, aspirin ec, gabapentin, and restasis. Her primarily concern today is the Back Pain (lower)  Initial Vital Signs:  Pulse/HCG Rate: 66ECG Heart Rate: 90 Temp: (!) 97.5 F (36.4 C) Resp: 16 BP: 139/65 SpO2: 100 %  BMI: Estimated body mass index is 22.96 kg/m as calculated from the following:   Height as of this encounter: 5\' 8"  (1.727 m).   Weight as of this encounter: 151 lb (68.5 kg).  Risk Assessment: Allergies: Reviewed. She is allergic to codeine; hydrocodone; peanuts [peanut oil];  penicillins; tramadol; and vitamin d analogs.  Allergy Precautions: None required Coagulopathies: Reviewed. None identified.  Blood-thinner therapy: None at this time Active Infection(s): Reviewed. None identified. [name] is afebrile  Site Confirmation: [name] was asked to confirm the procedure and laterality before marking the site Procedure checklist: Completed Consent: Before the procedure and under the influence of no sedative(s), amnesic(s), or anxiolytics, the patient was informed of the treatment options, risks and possible complications. To fulfill our ethical and legal obligations, as recommended by the American Medical Association's Code of Ethics, I have informed the patient of my clinical impression; the nature and purpose of the treatment or procedure; the risks, benefits, and possible complications of the intervention; the alternatives, including doing nothing; the risk(s) and benefit(s) of the alternative treatment(s) or procedure(s); and the risk(s) and benefit(s) of doing nothing. The patient was provided information about the general risks and possible complications associated with the procedure. These may include, but are not limited to: failure to achieve desired goals, infection, bleeding, organ or nerve damage, allergic reactions, paralysis, and death. In addition, the patient was informed of those risks and complications associated to the procedure, such as failure to decrease pain; infection; bleeding; organ or nerve damage with subsequent damage to sensory, motor, and/or autonomic systems, resulting in permanent pain, numbness, and/or weakness of one or several areas of the body; allergic reactions; (i.e.: anaphylactic reaction); and/or death. Furthermore, the patient was informed of those risks and complications associated with the medications. These include, but are not limited to: allergic reactions (i.e.: anaphylactic or anaphylactoid reaction(s)); adrenal axis  suppression; blood sugar elevation that in diabetics may result in ketoacidosis or comma; water  retention that in patients with history of congestive heart failure may result in shortness of breath, pulmonary edema, and decompensation with resultant heart failure; weight gain; swelling or edema; medication-induced neural toxicity; particulate matter embolism and blood vessel occlusion with resultant organ, and/or nervous system infarction; and/or aseptic necrosis of one or more joints. Finally, the patient was informed that Medicine is not an exact science; therefore, there is also the possibility of unforeseen or unpredictable risks and/or possible complications that may result in a catastrophic outcome. The patient indicated having understood very clearly. We have given the patient no guarantees and we have made no promises. Enough time was given to the patient to ask questions, all of which were answered to the patient's satisfaction. [name] has indicated that she wanted to continue with the procedure. Attestation: I, the ordering provider, attest that I have discussed with the patient the benefits, risks, side-effects, alternatives, likelihood of achieving goals, and potential problems during recovery for the procedure that I have provided informed consent. Date  Time: 12/01/2018  9:20 AM  Pre-Procedure Preparation:  Monitoring: As per clinic protocol. Respiration, ETCO2, SpO2, BP, heart rate and rhythm monitor placed and checked for adequate function Safety Precautions: Patient was assessed for positional comfort and pressure points before starting the procedure. Time-out: I initiated and conducted the "Time-out" before starting the procedure, as per protocol. The patient was asked to participate by confirming the accuracy of the "Time Out" information. Verification of the correct person, site, and procedure were performed and confirmed by me, the nursing staff, and the patient. "Time-out" conducted  as per Joint Commission's Universal Protocol (UP.01.01.01). Time: 8250  Description of Procedure:          Target Area: Inferior, posterior, aspect of the sacroiliac fissure Approach: Posterior, paraspinal, ipsilateral approach. Area Prepped: Entire Lower Lumbosacral Region Prepping solution: ChloraPrep (2% chlorhexidine gluconate and 70% isopropyl alcohol) Safety Precautions: Aspiration looking for blood return was conducted prior to all injections. At no point did we inject any substances, as a needle was being advanced. No attempts were made at seeking any paresthesias. Safe injection practices and needle disposal techniques used. Medications properly checked for expiration dates. SDV (single dose vial) medications used. Description of the Procedure: Protocol guidelines were followed. The patient was placed in position over the procedure table. The target area was identified and the area prepped in the usual manner. Skin & deeper tissues infiltrated with local anesthetic. Appropriate amount of time allowed to pass for local anesthetics to take effect. The procedure needle was advanced under fluoroscopic guidance into the sacroiliac joint until a firm endpoint was obtained. Proper needle placement secured. Negative aspiration confirmed. Solution injected in intermittent fashion, asking for systemic symptoms every 0.5cc of injectate. The needles were then removed and the area cleansed, making sure to leave some of the prepping solution back to take advantage of its long term bactericidal properties. Vitals:   12/01/18 1000 12/01/18 1005 12/01/18 1010 12/01/18 1015  BP: 130/79 120/75 125/76 110/68  Pulse:      Resp: 10 10 10 12   Temp:      TempSrc:      SpO2: 98% 99% 98% 100%  Weight:      Height:        Start Time: 0959 hrs. End Time: 1011 hrs. Materials:  Needle(s) Type: Spinal Needle Gauge: 25G Length: 3.5-in Medication(s): Please see orders for medications and dosing details. 5 cc  solution made of 4 cc of 0.2% ropivacaine, 1 cc of  Decadron 10 mg/cc.  2.5 cc injected intra-articular, 2.5 cc injected periarticular for left sacroiliac joint 5 cc solution made of 4 cc of 0.2% ropivacaine, 1 cc of Decadron 10 mg/cc.  2.5 cc injected intra-articular, 2.5 cc injected periarticular for right sacroiliac joint Total steroid dose: 20 mg Decadron Imaging Guidance (Non-Spinal):          Type of Imaging Technique: Fluoroscopy Guidance (Non-Spinal) Indication(s): Assistance in needle guidance and placement for procedures requiring needle placement in or near specific anatomical locations not easily accessible without such assistance. Exposure Time: Please see nurses notes. Contrast: Before injecting any contrast, we confirmed that the patient did not have an allergy to iodine, shellfish, or radiological contrast. Once satisfactory needle placement was completed at the desired level, radiological contrast was injected. Contrast injected under live fluoroscopy. No contrast complications. See chart for type and volume of contrast used. Fluoroscopic Guidance: I was personally present during the use of fluoroscopy. "Tunnel Vision Technique" used to obtain the best possible view of the target area. Parallax error corrected before commencing the procedure. "Direction-depth-direction" technique used to introduce the needle under continuous pulsed fluoroscopy. Once target was reached, antero-posterior, oblique, and lateral fluoroscopic projection used confirm needle placement in all planes. Images permanently stored in EMR. Interpretation: I personally interpreted the imaging intraoperatively. Adequate needle placement confirmed in multiple planes. Appropriate spread of contrast into desired area was observed. No evidence of afferent or efferent intravascular uptake. Permanent images saved into the patient's record.  Antibiotic Prophylaxis:   Anti-infectives (From admission, onward)   None      Indication(s): None identified  Post-operative Assessment:  Post-procedure Vital Signs:  Pulse/HCG Rate: 6669 Temp: (!) 97.5 F (36.4 C) Resp: 12 BP: 110/68 SpO2: 100 %  EBL: None  Complications: No immediate post-treatment complications observed by team, or reported by patient.  Note: The patient tolerated the entire procedure well. A repeat set of vitals were taken after the procedure and the patient was kept under observation following institutional policy, for this type of procedure. Post-procedural neurological assessment was performed, showing return to baseline, prior to discharge. The patient was provided with post-procedure discharge instructions, including a section on how to identify potential problems. Should any problems arise concerning this procedure, the patient was given instructions to immediately contact us, at any time, without hesitation. In any case, we plan to contact the patient by telephone for a follow-up status report regarding this interventional procedure.  Comments:  No additional relevant information.  Plan of Care  Orders:  Orders Placed This Encounter  Procedures  . DG C-Arm 1-60 Min-No Report    Intraoperative interpretation by procedural physician at [place].    Standing Status:   Standing    Number of Occurrences:   1    Order Specific Question:   Reason for exam:    Answer:   Assistance in needle guidance and placement for procedures requiring needle placement in or near specific anatomical locations not easily accessible without such assistance.   Medications ordered for procedure: Meds ordered this encounter  Medications  . ropivacaine (PF) 2 mg/mL (0.2%) (NAROPIN) injection 9 mL  . lidocaine (XYLOCAINE) 2 % (with pres) injection 400 mg  . dexamethasone (DECADRON) injection 10 mg  . dexamethasone (DECADRON) injection 10 mg  . iopamidol (ISOVUE-M) 41 % intrathecal injection 10 mL    Must be Myelogram-compatible. If not  available, you may substitute with a water-soluble, non-ionic, hypoallergenic, myelogram-compatible radiological contrast medium.   Medications administered: We  administered ropivacaine (PF) 2 mg/mL (0.2%), lidocaine, dexamethasone, dexamethasone, and iopamidol.  See the medical record for exact dosing, route, and time of administration.  Disposition: Discharge home  Discharge Date & Time: 12/01/2018; 1020 hrs.   Follow-up plan:   Return in about 4 weeks (around 12/29/2018) for Post Procedure Evaluation.     Future Appointments  Date Time Provider [place]  12/23/2018  9:30 AM Gillis Santa, MD [facility] None   Primary Care Physician: Hortencia Pilar, MD Location: [facility] Outpatient Pain Management Facility Note by: Gillis Santa, MD Date: 12/01/2018; Time: 2:38 PM  Disclaimer:  Medicine is not an exact science. The only guarantee in medicine is that nothing is guaranteed. It is important to note that the decision to proceed with this intervention was based on the information collected from the patient. The Data and conclusions were drawn from the patient's questionnaire, the interview, and the physical examination. Because the information was provided in large part by the patient, it cannot be guaranteed that it has not been purposely or unconsciously manipulated. Every effort has been made to obtain as much relevant data as possible for this evaluation. It is important to note that the conclusions that lead to this procedure are derived in large part from the available data. Always take into account that the treatment will also be dependent on availability of resources and existing treatment guidelines, considered by other Pain Management Practitioners as being common knowledge and practice, at the time of the intervention. For Medico-Legal purposes, it is also important to point out that variation in procedural techniques and pharmacological choices are the acceptable norm. The indications,  contraindications, technique, and results of the above procedure should only be interpreted and judged by a Board-Certified Interventional Pain Specialist with extensive familiarity and expertise in the same exact procedure and technique.

## 2018-12-02 ENCOUNTER — Telehealth: Payer: Self-pay

## 2018-12-02 NOTE — Telephone Encounter (Signed)
Post procedure phone call.   No answer.  

## 2018-12-23 ENCOUNTER — Ambulatory Visit: Payer: Medicare Other | Admitting: Student in an Organized Health Care Education/Training Program

## 2019-02-24 ENCOUNTER — Other Ambulatory Visit: Payer: Self-pay

## 2019-02-24 ENCOUNTER — Encounter: Payer: Self-pay | Admitting: Student in an Organized Health Care Education/Training Program

## 2019-02-24 ENCOUNTER — Ambulatory Visit
Payer: Medicare Other | Attending: Student in an Organized Health Care Education/Training Program | Admitting: Student in an Organized Health Care Education/Training Program

## 2019-02-24 DIAGNOSIS — G894 Chronic pain syndrome: Secondary | ICD-10-CM

## 2019-02-24 DIAGNOSIS — M47818 Spondylosis without myelopathy or radiculopathy, sacral and sacrococcygeal region: Secondary | ICD-10-CM

## 2019-02-24 DIAGNOSIS — M533 Sacrococcygeal disorders, not elsewhere classified: Secondary | ICD-10-CM

## 2019-02-24 DIAGNOSIS — G629 Polyneuropathy, unspecified: Secondary | ICD-10-CM

## 2019-02-24 DIAGNOSIS — G8929 Other chronic pain: Secondary | ICD-10-CM

## 2019-02-24 NOTE — Progress Notes (Signed)
Pain Management Virtual Encounter Note - Virtual Visit via [place] (real-time audio visits between healthcare provider and patient).   Patient's Phone No. & Preferred Pharmacy:  [phone] (home); [phone] (mobile); (Preferred) [phone] [email or web address]  [id number] [address], [place] - [place] [address] Phone: [phone] Fax: [phone]    Pre-screening note:  Our staff contacted Sarah [name] and offered her an "in person", "face-to-face" appointment versus a telephone encounter. She indicated preferring the telephone encounter, at this time.   Reason for Virtual Visit: COVID-19*  Social distancing based on CDC and AMA recommendations.   I contacted Sarah [name] on 02/24/2019 at 11:33 AM via video conference.      I clearly identified myself as Gillis Santa, MD. I verified that I was speaking with the correct person using two identifiers (Name: Sarah [name], and date of birth: 10/31/1931).  Advanced Informed Consent I sought verbal advanced consent from Sarah [name] for virtual visit interactions. I informed Sarah [name] of possible security and privacy concerns, risks, and limitations associated with providing "not-in-person" medical evaluation and management services. I also informed Sarah [name] of the availability of "in-person" appointments. Finally, I informed her that there would be a charge for the virtual visit and that she could be  personally, fully or partially, financially responsible for it. Sarah [name] expressed understanding and agreed to proceed.   Historic Elements   Sarah [name] is a 85 y.o. year old, female patient evaluated today after her last encounter by our practice on 12/02/2018. Sarah [name]  has a past medical history of Cancer [facility]), Seizures ([place]), and Thyroid disease. She also  has a past surgical history that includes Abdominal hysterectomy and Breast  biopsy (Bilateral). Sarah [name] has a current medication list which includes the following prescription(s): apoaequorin, beta carotene w/minerals, dicyclomine, lovastatin, lumigan, melatonin, sertraline, simvastatin, synthroid, aspirin ec, gabapentin, hydroxyzine, levothyroxine, levothyroxine, montelukast, phenazopyridine, pregabalin, and restasis. She  reports that she has never smoked. She has never used smokeless tobacco. She reports that she does not drink alcohol. No history on file for drug. Sarah [name] is allergic to codeine; hydrocodone; peanuts [peanut oil]; penicillins; tramadol; and vitamin d analogs.   HPI  Today, she is being contacted for a post-procedure assessment.   Evaluation of last interventional procedure  12/01/2018 Procedure: Bilateral SI Joint injection Pre-procedure pain score:  0/10 Post-procedure pain score: 0/10         Influential Factors: Intra-procedural challenges: None observed.         Reported side-effects: None.        Post-procedural adverse reactions or complications: None reported         Sedation: Please see nurses note for DOS. When no sedatives are used, the analgesic levels obtained are directly associated to the effectiveness of the local anesthetics. However, when sedation is provided, the level of analgesia obtained during the initial 1 hour following the intervention, is believed to be the result of a combination of factors. These factors may include, but are not limited to: 1. The effectiveness of the local anesthetics used. 2. The effects of the analgesic(s) and/or anxiolytic(s) used. 3. The degree of discomfort experienced by the patient at the time of the procedure. 4. The patients ability and reliability in recalling and recording the events. 5. The presence and influence of possible secondary gains and/or psychosocial factors. Reported result: Relief experienced during the 1st hour after the procedure:100%   (Ultra-Short Term  Relief)             Interpretative annotation: Clinically appropriate result. Analgesia during this period is likely to be Local Anesthetic and/or IV Sedative (Analgesic/Anxiolytic) related.          Effects of local anesthetic: The analgesic effects attained during this period are directly associated to the localized infiltration of local anesthetics and therefore cary significant diagnostic value as to the etiological location, or anatomical origin, of the pain. Expected duration of relief is directly dependent on the pharmacodynamics of the local anesthetic used. Long-acting (4-6 hours) anesthetics used.  Reported result: Relief during the next 4 to 6 hour after the procedure: 100%   (Short-Term Relief)            Interpretative annotation: Clinically appropriate result. Analgesia during this period is likely to be Local Anesthetic-related.          Long-term benefit: Defined as the period of time past the expected duration of local anesthetics (1 hour for short-acting and 4-6 hours for long-acting). With the possible exception of prolonged sympathetic blockade from the local anesthetics, benefits during this period are typically attributed to, or associated with, other factors such as analgesic sensory neuropraxia, antiinflammatory effects, or beneficial biochemical changes provided by agents other than the local anesthetics.  Reported result: Extended relief following procedure:80% for 6-7 weeks, now pain returning an approaching level pre block   (Long-Term Relief)            Interpretative annotation: Clinically appropriate result. Good relief. No permanent benefit expected. Inflammation plays a part in the etiology to the pain.          Repeat procedure.  Pertinent Labs   SAFETY SCREENING Profile No results found for: SARSCOV2NAA, STAPHAUREUS, MRSAPCR, HCVAB, HIV, PREGTESTUR Renal Function Lab Results  Component Value Date   BUN 23 (H) 09/14/2015   CREATININE 0.80 09/14/2015   GFRAA >60 09/14/2015    GFRNONAA >60 09/14/2015   Hepatic Function Lab Results  Component Value Date   AST 19 11/26/2015   ALT 15 11/26/2015   ALBUMIN 4.7 11/26/2015   UDS No results found for: SUMMARY Note: Above Lab results reviewed.  Recent imaging  DG C-Arm 1-60 Min-No Report Fluoroscopy was utilized by the requesting physician.  No radiographic  interpretation.   Assessment  The primary encounter diagnosis was Chronic SI joint pain. Diagnoses of SI joint arthritis, Chronic pain syndrome, and Neuropathy were also pertinent to this visit.  Plan of Care  I am having Sarah [name] maintain her Lumigan, Restasis, gabapentin, simvastatin, sertraline, Synthroid, hydrOXYzine, aspirin EC, beta carotene w/minerals, montelukast, pregabalin, dicyclomine, lovastatin, Apoaequorin (PREVAGEN PO), phenazopyridine, Melatonin, levothyroxine, and levothyroxine.  Pharmacotherapy (Medications Ordered): No orders of the defined types were placed in this encounter.  Orders:  Orders Placed This Encounter  Procedures  . SACROILIAC JOINT INJECTION    Standing Status:   Future    Standing Expiration Date:   03/26/2019    Scheduling Instructions:     Side: Bilateral     Sedation: No Sedation.     Timeframe: ASAP    Order Specific Question:   Where will this procedure be performed?    Answer:   [facility]   Follow-up plan:   Return for Procedure, B/L SI-J #.    I discussed the assessment and treatment plan with the patient. The patient was provided an opportunity to ask questions and all were answered. The patient agreed with the plan and demonstrated an  understanding of the instructions.  Patient advised to call back or seek an in-person evaluation if the symptoms or condition worsens.  Total duration of non-face-to-face encounter: 15 minutes.  Note by: Gillis Santa, MD Date: 02/24/2019; Time: 11:33 AM  Note: This dictation was prepared with Dragon dictation. Any transcriptional errors that may result  from this process are unintentional.  Disclaimer:  * Given the special circumstances of the COVID-19 pandemic, the federal government has announced that the Office for Civil Rights (OCR) will exercise its enforcement discretion and will not impose penalties on physicians using telehealth in the event of noncompliance with regulatory requirements under the [place] (HIPAA) in connection with the good faith provision of telehealth during the UJWJX-91 national public health emergency. ([place])

## 2019-03-02 ENCOUNTER — Other Ambulatory Visit: Payer: Self-pay | Admitting: Family Medicine

## 2019-03-02 DIAGNOSIS — Z1231 Encounter for screening mammogram for malignant neoplasm of breast: Secondary | ICD-10-CM

## 2019-03-09 ENCOUNTER — Other Ambulatory Visit
Admission: RE | Admit: 2019-03-09 | Discharge: 2019-03-09 | Disposition: A | Discharge status: Home / Self Care | Payer: Medicare Other | Source: Ambulatory Visit | Attending: Student in an Organized Health Care Education/Training Program | Admitting: Student in an Organized Health Care Education/Training Program

## 2019-03-09 ENCOUNTER — Other Ambulatory Visit: Payer: Self-pay

## 2019-03-09 DIAGNOSIS — Z1159 Encounter for screening for other viral diseases: Secondary | ICD-10-CM | POA: Insufficient documentation

## 2019-03-10 ENCOUNTER — Other Ambulatory Visit: Payer: Self-pay

## 2019-03-10 ENCOUNTER — Ambulatory Visit
Admission: RE | Admit: 2019-03-10 | Discharge: 2019-03-10 | Disposition: A | Discharge status: Home / Self Care | Payer: Medicare Other | Source: Ambulatory Visit | Attending: Family Medicine | Admitting: Family Medicine

## 2019-03-10 DIAGNOSIS — Z1231 Encounter for screening mammogram for malignant neoplasm of breast: Secondary | ICD-10-CM | POA: Diagnosis not present

## 2019-03-10 LAB — NOVEL CORONAVIRUS, NAA (HOSP ORDER, SEND-OUT TO REF LAB; TAT 18-24 HRS): SARS-CoV-2, NAA: NOT DETECTED

## 2019-03-14 ENCOUNTER — Ambulatory Visit
Admission: RE | Admit: 2019-03-14 | Discharge: 2019-03-14 | Disposition: A | Discharge status: Home / Self Care | Payer: Medicare Other | Source: Ambulatory Visit | Attending: Student in an Organized Health Care Education/Training Program | Admitting: Student in an Organized Health Care Education/Training Program

## 2019-03-14 ENCOUNTER — Ambulatory Visit (HOSPITAL_BASED_OUTPATIENT_CLINIC_OR_DEPARTMENT_OTHER): Payer: Medicare Other | Admitting: Student in an Organized Health Care Education/Training Program

## 2019-03-14 ENCOUNTER — Encounter: Payer: Self-pay | Admitting: Student in an Organized Health Care Education/Training Program

## 2019-03-14 ENCOUNTER — Other Ambulatory Visit: Payer: Self-pay

## 2019-03-14 VITALS — BP 127/86 | HR 71 | Temp 98.3°F | Resp 14 | Ht 68.0 in | Wt 150.0 lb

## 2019-03-14 DIAGNOSIS — M47818 Spondylosis without myelopathy or radiculopathy, sacral and sacrococcygeal region: Secondary | ICD-10-CM

## 2019-03-14 MED ORDER — ROPIVACAINE HCL 2 MG/ML IJ SOLN
INTRAMUSCULAR | Status: AC
  Filled 2019-03-14: qty 10

## 2019-03-14 MED ORDER — ROPIVACAINE HCL 2 MG/ML IJ SOLN
2.0000 mL | Freq: Once | INTRAMUSCULAR | Status: AC
  Administered 2019-03-14: 2 mL via EPIDURAL

## 2019-03-14 MED ORDER — DEXAMETHASONE SODIUM PHOSPHATE 10 MG/ML IJ SOLN
10.0000 mg | Freq: Once | INTRAMUSCULAR | Status: AC
  Administered 2019-03-14: 10 mg

## 2019-03-14 MED ORDER — LIDOCAINE HCL 2 % IJ SOLN
INTRAMUSCULAR | Status: AC
  Filled 2019-03-14: qty 20

## 2019-03-14 MED ORDER — DEXAMETHASONE SODIUM PHOSPHATE 10 MG/ML IJ SOLN
INTRAMUSCULAR | Status: AC
  Filled 2019-03-14: qty 1

## 2019-03-14 MED ORDER — LIDOCAINE HCL 2 % IJ SOLN
20.0000 mL | Freq: Once | INTRAMUSCULAR | Status: AC
  Administered 2019-03-14: 400 mg

## 2019-03-14 MED ORDER — IOHEXOL 180 MG/ML  SOLN
10.0000 mL | Freq: Once | INTRAMUSCULAR | Status: AC
  Administered 2019-03-14: 10 mL via EPIDURAL

## 2019-03-14 NOTE — Patient Instructions (Signed)

## 2019-03-14 NOTE — Progress Notes (Signed)
Patient's Name: Sarah [name]  MRN: [id number]  Referring Provider: Hortencia Pilar, MD  DOB: Aug 14, 1932  PCP: Hortencia Pilar, MD  DOS: 03/14/2019  Note by: Gillis Santa, MD  Service setting: Ambulatory outpatient  Specialty: Interventional Pain Management  Patient type: Established  Location: [facility] (AMB) [facility] Facility  Visit type: Interventional Procedure   Primary Reason for Visit: Interventional Pain Management Treatment. CC: Hip Pain (both)  Procedure:          Anesthesia, Analgesia, Anxiolysis:  Type: Diagnostic Sacroiliac Joint Steroid Injection #2  Region: Inferior Lumbosacral Region Level: PIIS (Posterior Inferior Iliac Spine) Laterality: Bilateral    Local Anesthetic: Lidocaine 1-2%  Position: Prone           Indications: 1. SI joint arthritis    Pain Score: Pre-procedure: 7 /10 Post-procedure: 0-No pain/10  Pre-op Assessment:  Sarah [name] is a 85 y.o. (year old), female patient, seen today for interventional treatment. She  has a past surgical history that includes Abdominal hysterectomy and Breast biopsy (Bilateral). Sarah [name] has a current medication list which includes the following prescription(s): apoaequorin, aspirin ec, beta carotene w/minerals, dicyclomine, gabapentin, hydroxyzine, levothyroxine, levothyroxine, lovastatin, lumigan, melatonin, montelukast, phenazopyridine, pregabalin, restasis, sertraline, simvastatin, and synthroid. Her primarily concern today is the Hip Pain (both)  Initial Vital Signs:  Pulse/HCG Rate: 78  Temp: 98.3 F (36.8 C) Resp: 12 BP: 115/86 SpO2: 99 %  BMI: Estimated body mass index is 22.81 kg/m as calculated from the following:   Height as of this encounter: 5\' 8"  (1.727 m).   Weight as of this encounter: 150 lb (68 kg).  Risk Assessment: Allergies: Reviewed. She is allergic to codeine; hydrocodone; peanuts [peanut oil]; penicillins; tramadol; and vitamin d analogs.  Allergy Precautions: None  required Coagulopathies: Reviewed. None identified.  Blood-thinner therapy: None at this time Active Infection(s): Reviewed. None identified. Sarah [name] is afebrile  Site Confirmation: Sarah [name] was asked to confirm the procedure and laterality before marking the site Procedure checklist: Completed Consent: Before the procedure and under the influence of no sedative(s), amnesic(s), or anxiolytics, the patient was informed of the treatment options, risks and possible complications. To fulfill our ethical and legal obligations, as recommended by the American Medical Association's Code of Ethics, I have informed the patient of my clinical impression; the nature and purpose of the treatment or procedure; the risks, benefits, and possible complications of the intervention; the alternatives, including doing nothing; the risk(s) and benefit(s) of the alternative treatment(s) or procedure(s); and the risk(s) and benefit(s) of doing nothing. The patient was provided information about the general risks and possible complications associated with the procedure. These may include, but are not limited to: failure to achieve desired goals, infection, bleeding, organ or nerve damage, allergic reactions, paralysis, and death. In addition, the patient was informed of those risks and complications associated to the procedure, such as failure to decrease pain; infection; bleeding; organ or nerve damage with subsequent damage to sensory, motor, and/or autonomic systems, resulting in permanent pain, numbness, and/or weakness of one or several areas of the body; allergic reactions; (i.e.: anaphylactic reaction); and/or death. Furthermore, the patient was informed of those risks and complications associated with the medications. These include, but are not limited to: allergic reactions (i.e.: anaphylactic or anaphylactoid reaction(s)); adrenal axis suppression; blood sugar elevation that in diabetics may result in ketoacidosis  or comma; water retention that in patients with history of congestive heart failure may result in shortness of breath, pulmonary edema, and decompensation with resultant  heart failure; weight gain; swelling or edema; medication-induced neural toxicity; particulate matter embolism and blood vessel occlusion with resultant organ, and/or nervous system infarction; and/or aseptic necrosis of one or more joints. Finally, the patient was informed that Medicine is not an exact science; therefore, there is also the possibility of unforeseen or unpredictable risks and/or possible complications that may result in a catastrophic outcome. The patient indicated having understood very clearly. We have given the patient no guarantees and we have made no promises. Enough time was given to the patient to ask questions, all of which were answered to the patient's satisfaction. Sarah [name] has indicated that she wanted to continue with the procedure. Attestation: I, the ordering provider, attest that I have discussed with the patient the benefits, risks, side-effects, alternatives, likelihood of achieving goals, and potential problems during recovery for the procedure that I have provided informed consent. Date  Time: 03/14/2019 11:28 AM  Pre-Procedure Preparation:  Monitoring: As per clinic protocol. Respiration, ETCO2, SpO2, BP, heart rate and rhythm monitor placed and checked for adequate function Safety Precautions: Patient was assessed for positional comfort and pressure points before starting the procedure. Time-out: I initiated and conducted the "Time-out" before starting the procedure, as per protocol. The patient was asked to participate by confirming the accuracy of the "Time Out" information. Verification of the correct person, site, and procedure were performed and confirmed by me, the nursing staff, and the patient. "Time-out" conducted as per Joint Commission's Universal Protocol (UP.01.01.01). Time:  1244  Description of Procedure:          Target Area: Inferior, posterior, aspect of the sacroiliac fissure Approach: Posterior, paraspinal, ipsilateral approach. Area Prepped: Entire Lower Lumbosacral Region Prepping solution: ChloraPrep (2% chlorhexidine gluconate and 70% isopropyl alcohol) Safety Precautions: Aspiration looking for blood return was conducted prior to all injections. At no point did we inject any substances, as a needle was being advanced. No attempts were made at seeking any paresthesias. Safe injection practices and needle disposal techniques used. Medications properly checked for expiration dates. SDV (single dose vial) medications used. Description of the Procedure: Protocol guidelines were followed. The patient was placed in position over the procedure table. The target area was identified and the area prepped in the usual manner. Skin & deeper tissues infiltrated with local anesthetic. Appropriate amount of time allowed to pass for local anesthetics to take effect. The procedure needle was advanced under fluoroscopic guidance into the sacroiliac joint until a firm endpoint was obtained. Proper needle placement secured. Negative aspiration confirmed. Solution injected in intermittent fashion, asking for systemic symptoms every 0.5cc of injectate. The needles were then removed and the area cleansed, making sure to leave some of the prepping solution back to take advantage of its long term bactericidal properties. Vitals:   03/14/19 1138 03/14/19 1235 03/14/19 1245 03/14/19 1255  BP: 115/86 127/82 127/75 127/86  Pulse: 78 70 68 71  Resp:  12 12 14   Temp: 98.3 F (36.8 C)     SpO2: 99% 99% 99% 98%  Weight: 150 lb (68 kg)     Height: 5\' 8"  (1.727 m)       Start Time: 1244 hrs. End Time: 1254 hrs. Materials:  Needle(s) Type: Spinal Needle Gauge: 25G Length: 3.5-in Medication(s): Please see orders for medications and dosing details. 5 cc solution made of 4.5 cc of 0.2%  ropivacaine, 0.5 cc of Decadron 10 mg/cc.  2.5 cc injected intra-articular, 2.5 cc injected periarticular for left sacroiliac joint 5 cc solution made  of 4.5 cc of 0.2% ropivacaine, 0.5 cc of Decadron 10 mg/cc.  2.5 cc injected intra-articular, 2.5 cc injected periarticular for right sacroiliac joint Total steroid dose: 10 mg Decadron Imaging Guidance (Non-Spinal):          Type of Imaging Technique: Fluoroscopy Guidance (Non-Spinal) Indication(s): Assistance in needle guidance and placement for procedures requiring needle placement in or near specific anatomical locations not easily accessible without such assistance. Exposure Time: Please see nurses notes. Contrast: Before injecting any contrast, we confirmed that the patient did not have an allergy to iodine, shellfish, or radiological contrast. Once satisfactory needle placement was completed at the desired level, radiological contrast was injected. Contrast injected under live fluoroscopy. No contrast complications. See chart for type and volume of contrast used. Fluoroscopic Guidance: I was personally present during the use of fluoroscopy. "Tunnel Vision Technique" used to obtain the best possible view of the target area. Parallax error corrected before commencing the procedure. "Direction-depth-direction" technique used to introduce the needle under continuous pulsed fluoroscopy. Once target was reached, antero-posterior, oblique, and lateral fluoroscopic projection used confirm needle placement in all planes. Images permanently stored in EMR. Interpretation: I personally interpreted the imaging intraoperatively. Adequate needle placement confirmed in multiple planes. Appropriate spread of contrast into desired area was observed. No evidence of afferent or efferent intravascular uptake. Permanent images saved into the patient's record.  Antibiotic Prophylaxis:   Anti-infectives (From admission, onward)   None     Indication(s): None  identified  Post-operative Assessment:  Post-procedure Vital Signs:  Pulse/HCG Rate: 71  Temp: 98.3 F (36.8 C) Resp: 14 BP: 127/86 SpO2: 98 %  EBL: None  Complications: No immediate post-treatment complications observed by team, or reported by patient.  Note: The patient tolerated the entire procedure well. A repeat set of vitals were taken after the procedure and the patient was kept under observation following institutional policy, for this type of procedure. Post-procedural neurological assessment was performed, showing return to baseline, prior to discharge. The patient was provided with post-procedure discharge instructions, including a section on how to identify potential problems. Should any problems arise concerning this procedure, the patient was given instructions to immediately contact us, at any time, without hesitation. In any case, we plan to contact the patient by telephone for a follow-up status report regarding this interventional procedure.  Comments:  No additional relevant information.  Plan of Care  Orders:  Orders Placed This Encounter  Procedures  . DG PAIN CLINIC C-ARM 1-60 MIN NO REPORT    Intraoperative interpretation by procedural physician at [facility].    Standing Status:   Standing    Number of Occurrences:   1    Order Specific Question:   Reason for exam:    Answer:   Assistance in needle guidance and placement for procedures requiring needle placement in or near specific anatomical locations not easily accessible without such assistance.   Medications ordered for procedure: Meds ordered this encounter  Medications  . iohexol (OMNIPAQUE) 180 MG/ML injection 10 mL    Must be Myelogram-compatible. If not available, you may substitute with a water-soluble, non-ionic, hypoallergenic, myelogram-compatible radiological contrast medium.  Marland Kitchen lidocaine (XYLOCAINE) 2 % (with pres) injection 400 mg  . dexamethasone (DECADRON) injection 10 mg  .  ropivacaine (PF) 2 mg/mL (0.2%) (NAROPIN) injection 2 mL   Medications administered: We administered iohexol, lidocaine, dexamethasone, and ropivacaine (PF) 2 mg/mL (0.2%).  See the medical record for exact dosing, route, and time of administration.  Disposition:  Discharge home  Discharge Date & Time: 03/14/2019; 1303 hrs.   Follow-up plan:   Return in about 8 weeks (around 05/09/2019) for Post Procedure Evaluation.     Future Appointments  Date Time Provider [place]  05/10/2019  8:45 AM Gillis Santa, MD [facility] None   Primary Care Physician: Hortencia Pilar, MD Location: [facility] Note by: Gillis Santa, MD Date: 03/14/2019; Time: 3:40 PM  Disclaimer:  Medicine is not an exact science. The only guarantee in medicine is that nothing is guaranteed. It is important to note that the decision to proceed with this intervention was based on the information collected from the patient. The Data and conclusions were drawn from the patient's questionnaire, the interview, and the physical examination. Because the information was provided in large part by the patient, it cannot be guaranteed that it has not been purposely or unconsciously manipulated. Every effort has been made to obtain as much relevant data as possible for this evaluation. It is important to note that the conclusions that lead to this procedure are derived in large part from the available data. Always take into account that the treatment will also be dependent on availability of resources and existing treatment guidelines, considered by other Pain Management Practitioners as being common knowledge and practice, at the time of the intervention. For Medico-Legal purposes, it is also important to point out that variation in procedural techniques and pharmacological choices are the acceptable norm. The indications, contraindications, technique, and results of the above procedure should only be  interpreted and judged by a Board-Certified Interventional Pain Specialist with extensive familiarity and expertise in the same exact procedure and technique.

## 2019-03-15 ENCOUNTER — Telehealth: Payer: Self-pay

## 2019-03-15 NOTE — Telephone Encounter (Signed)
Post procedure phone call.  Patient states she is doing ok.  

## 2019-05-09 ENCOUNTER — Encounter: Payer: Self-pay | Admitting: Student in an Organized Health Care Education/Training Program

## 2019-05-10 ENCOUNTER — Other Ambulatory Visit: Payer: Self-pay

## 2019-05-10 ENCOUNTER — Ambulatory Visit
Payer: Medicare Other | Attending: Student in an Organized Health Care Education/Training Program | Admitting: Student in an Organized Health Care Education/Training Program

## 2019-05-10 DIAGNOSIS — G8929 Other chronic pain: Secondary | ICD-10-CM

## 2019-05-10 DIAGNOSIS — M47818 Spondylosis without myelopathy or radiculopathy, sacral and sacrococcygeal region: Secondary | ICD-10-CM

## 2019-05-10 DIAGNOSIS — G894 Chronic pain syndrome: Secondary | ICD-10-CM | POA: Diagnosis not present

## 2019-05-10 DIAGNOSIS — M5416 Radiculopathy, lumbar region: Secondary | ICD-10-CM | POA: Diagnosis not present

## 2019-05-10 DIAGNOSIS — M533 Sacrococcygeal disorders, not elsewhere classified: Secondary | ICD-10-CM | POA: Diagnosis not present

## 2019-05-10 DIAGNOSIS — G629 Polyneuropathy, unspecified: Secondary | ICD-10-CM

## 2019-05-10 DIAGNOSIS — M5126 Other intervertebral disc displacement, lumbar region: Secondary | ICD-10-CM

## 2019-05-10 MED ORDER — OXYCODONE-ACETAMINOPHEN 5-325 MG PO TABS: 1.0000 | ORAL_TABLET | Freq: Every day | ORAL | 0 refills | Status: AC | PRN

## 2019-05-10 NOTE — Progress Notes (Signed)
Pain Management Virtual Encounter Note - Virtual Visit via [place] (real-time audio visits between healthcare provider and patient).   Patient's Phone No. & Preferred Pharmacy:  [phone] (home); [phone] (mobile); (Preferred) [phone] [email or web address]  [id number] [address], [place] [address] Phone: [phone] Fax: [phone]    Pre-screening note:  Our staff contacted Sarah [name] and offered her an "in person", "face-to-face" appointment versus a telephone encounter. She indicated preferring the telephone encounter, at this time.   Reason for Virtual Visit: COVID-19*  Social distancing based on CDC and AMA recommendations.   I contacted Sarah [name] on 05/10/2019 via telephone.      I clearly identified myself as Sarah Santa, MD. I verified that I was speaking with the correct person using two identifiers (Name: Sarah [name], and date of birth: June 11, 1932).  Advanced Informed Consent I sought verbal advanced consent from Sarah [name] for virtual visit interactions. I informed [name] of possible security and privacy concerns, risks, and limitations associated with providing "not-in-person" medical evaluation and management services. I also informed [name] of the availability of "in-person" appointments. Finally, I informed her that there would be a charge for the virtual visit and that she could be  personally, fully or partially, financially responsible for it. [name] expressed understanding and agreed to proceed.   Historic Elements   [name] is a 85 y.o. year old, female patient evaluated today after her last encounter by our practice on 03/15/2019. [name]  has a past medical history of Cancer [facility]), Seizures ([place]), and Thyroid disease. She also  has a past surgical history that includes Abdominal hysterectomy and Breast biopsy (Bilateral).  [name] has a current medication list which includes the following prescription(s): apoaequorin, beta carotene w/minerals, dicyclomine, hydroxyzine, levothyroxine, lovastatin, lumigan, melatonin, pregabalin, sertraline, simvastatin, aspirin ec, gabapentin, levothyroxine, montelukast, oxycodone-acetaminophen, phenazopyridine, restasis, and synthroid. She  reports that she has never smoked. She has never used smokeless tobacco. She reports that she does not drink alcohol. No history on file for drug. [name] is allergic to codeine; hydrocodone; peanuts [peanut oil]; penicillins; tramadol; and vitamin d analogs.   HPI  Today, she is being contacted for a post-procedure assessment.  Evaluation of last interventional procedure  03/14/2019 Procedure: Bilateral SI-J Injection #2 Pre-procedure pain score:  7/10 Post-procedure pain score: 0/10         Influential Factors: Intra-procedural challenges: None observed.         Reported side-effects: None.        Post-procedural adverse reactions or complications: None reported         Sedation: Please see nurses note for DOS. When no sedatives are used, the analgesic levels obtained are directly associated to the effectiveness of the local anesthetics. However, when sedation is provided, the level of analgesia obtained during the initial 1 hour following the intervention, is believed to be the result of a combination of factors. These factors may include, but are not limited to: 1. The effectiveness of the local anesthetics used. 2. The effects of the analgesic(s) and/or anxiolytic(s) used. 3. The degree of discomfort experienced by the patient at the time of the procedure. 4. The patients ability and reliability in recalling and recording the events. 5. The presence and influence of possible secondary gains and/or psychosocial factors. Reported result: Relief experienced during the 1st hour after the procedure: 0 % (Ultra-Short Term Relief)  Interpretative annotation: Clinically appropriate result. Analgesia during this period is likely to be Local Anesthetic and/or IV Sedative (Analgesic/Anxiolytic) related.          Effects of local anesthetic: The analgesic effects attained during this period are directly associated to the localized infiltration of local anesthetics and therefore cary significant diagnostic value as to the etiological location, or anatomical origin, of the pain. Expected duration of relief is directly dependent on the pharmacodynamics of the local anesthetic used. Long-acting (4-6 hours) anesthetics used.  Reported result: Relief during the next 4 to 6 hour after the procedure: 100 % (Short-Term Relief)            Interpretative annotation: Clinically appropriate result. Analgesia during this period is likely to be Local Anesthetic-related.          Long-term benefit: Defined as the period of time past the expected duration of local anesthetics (1 hour for short-acting and 4-6 hours for long-acting). With the possible exception of prolonged sympathetic blockade from the local anesthetics, benefits during this period are typically attributed to, or associated with, other factors such as analgesic sensory neuropraxia, antiinflammatory effects, or beneficial biochemical changes provided by agents other than the local anesthetics.  Reported result: Extended relief following procedure: 100 %(lasted 3 weeks) (Long-Term Relief)            Interpretative annotation: Clinically appropriate result. Good relief. No permanent benefit expected. Inflammation plays a part in the etiology to the pain.           Repeat SI-J #3.  Laboratory Chemistry Profile (12 mo)  Renal: No results found for requested labs within last 8760 hours.  Lab Results  Component Value Date   GFRAA >60 09/14/2015   GFRNONAA >60 09/14/2015   Hepatic: No results found for requested labs within last 8760 hours. Lab Results  Component Value Date   AST 19  11/26/2015   ALT 15 11/26/2015   Other: No results found for requested labs within last 8760 hours. Note: Above Lab results reviewed.  Imaging  Last 90 days:  [facility]g [facility] C-arm 1-60 Min No Report  Result Date: 03/21/2019 Fluoro was used, but no Radiologist interpretation will be provided. Please refer to "NOTES" tab for provider progress note.  Mm 3d Screen Breast Bilateral  Result Date: 03/10/2019 CLINICAL DATA:  Screening. EXAM: DIGITAL SCREENING BILATERAL MAMMOGRAM WITH TOMO AND CAD COMPARISON:  Previous exam(s). ACR Breast Density Category b: There are scattered areas of fibroglandular density. FINDINGS: There are no findings suspicious for malignancy. Images were processed with CAD. IMPRESSION: No mammographic evidence of malignancy. A result letter of this screening mammogram will be mailed directly to the patient. RECOMMENDATION: Screening mammogram in one year. (Code:SM-B-01Y) BI-RADS CATEGORY  1: Negative. Electronically Signed   By: Curlene Dolphin M.D.   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 Diagnoses of Chronic SI joint pain, Chronic pain syndrome, Lumbar radiculopathy, Lumbar disc herniation (L2/L3), and Neuropathy were also pertinent to this visit.  Plan of Care  I am having Sidney Ace start on oxyCODONE-acetaminophen. I am also having her maintain her Lumigan, Restasis, gabapentin, simvastatin, sertraline, Synthroid, hydrOXYzine, aspirin EC, beta carotene w/minerals, montelukast, pregabalin, dicyclomine, lovastatin, Apoaequorin (PREVAGEN PO), phenazopyridine, Melatonin, levothyroxine, and levothyroxine.  Patient finds significant benefit for 4 to 6 weeks after bilateral sacroiliac joint injection.  We can consider repeating these every 3 to 4 months to help manage her pain.  For her  pain flares, I will prescribe her Percocet as below to take when she has significant breakthrough pain.  I recommend the patient do this  after she has tried to take an extra strength Tylenol, 500 mg to see if that helps improve her pain flare symptoms.  I expect this prescription to last the patient many months.  PMP checked and appropriate.  Pharmacotherapy (Medications Ordered): Meds ordered this encounter  Medications  . oxyCODONE-acetaminophen (PERCOCET) 5-325 MG tablet    Sig: Take 1 tablet by mouth daily as needed for severe pain. Must last 30 days.    Dispense:  30 tablet    Refill:  0    Chronic Pain. (STOP Act - Not applicable). Fill one day early if closed on scheduled refill date.   Orders:  Orders Placed This Encounter  Procedures  . SACROILIAC JOINT INJECTION    Standing Status:   Future    Standing Expiration Date:   06/10/2019    Scheduling Instructions:     Side: Bilateral     Sedation: WITHOUT     Timeframe: ASAP    Order Specific Question:   Where will this procedure be performed?    Answer:   [facility]   Follow-up plan:   Return in about 2 weeks (around 05/24/2019) for Procedure bilateral sacroiliac joint injection without sedation.     Finds benefit with bilateral sacroiliac joint injection, previous one 03/14/2019.  Repeat.  Also finds lumbar epidural steroid injections for when she has sciatic flare, consider repeating.  Previous one performed on 10/13/2018 at [facility].   Recent Visits Date Type Provider Dept  03/14/19 Procedure visit Sarah Santa, MD [facility]  02/24/19 Office Visit Sarah Santa, MD [facility]  Showing recent visits within past 90 days and meeting all other requirements   Today's Visits Date Type Provider Dept  05/10/19 Office Visit Sarah Santa, MD [facility]  Showing today's visits and meeting all other requirements   Future Appointments No visits were found meeting these conditions.  Showing future appointments within next 90 days and meeting all other requirements   I discussed the assessment and treatment plan with the  patient. The patient was provided an opportunity to ask questions and all were answered. The patient agreed with the plan and demonstrated an understanding of the instructions.  Patient advised to call back or seek an in-person evaluation if the symptoms or condition worsens.  Total duration of non-face-to-face encounter:25 minutes.  Note by: Sarah Santa, MD Date: 05/10/2019; Time: 10:00 AM  Note: This dictation was prepared with Dragon dictation. Any transcriptional errors that may result from this process are unintentional.  Disclaimer:  * Given the special circumstances of the COVID-19 pandemic, the federal government has announced that the Office for Civil Rights (OCR) will exercise its enforcement discretion and will not impose penalties on physicians using telehealth in the event of noncompliance with regulatory requirements under the [place] and [place] (HIPAA) in connection with the good faith provision of telehealth during the BMWUX-32 national public health emergency. ([place])

## 2019-05-23 ENCOUNTER — Ambulatory Visit: Payer: Medicare Other | Admitting: Student in an Organized Health Care Education/Training Program

## 2019-05-25 ENCOUNTER — Ambulatory Visit
Admission: RE | Admit: 2019-05-25 | Discharge: 2019-05-25 | Disposition: A | Discharge status: Home / Self Care | Payer: Medicare Other | Source: Ambulatory Visit | Attending: Student in an Organized Health Care Education/Training Program | Admitting: Student in an Organized Health Care Education/Training Program

## 2019-05-25 ENCOUNTER — Other Ambulatory Visit: Payer: Self-pay

## 2019-05-25 ENCOUNTER — Ambulatory Visit (HOSPITAL_BASED_OUTPATIENT_CLINIC_OR_DEPARTMENT_OTHER): Payer: Medicare Other | Admitting: Student in an Organized Health Care Education/Training Program

## 2019-05-25 ENCOUNTER — Encounter: Payer: Self-pay | Admitting: Student in an Organized Health Care Education/Training Program

## 2019-05-25 VITALS — BP 150/84 | HR 62 | Temp 98.3°F | Resp 16 | Ht 68.0 in | Wt 154.0 lb

## 2019-05-25 DIAGNOSIS — G8929 Other chronic pain: Secondary | ICD-10-CM

## 2019-05-25 DIAGNOSIS — M533 Sacrococcygeal disorders, not elsewhere classified: Secondary | ICD-10-CM

## 2019-05-25 DIAGNOSIS — G894 Chronic pain syndrome: Secondary | ICD-10-CM | POA: Insufficient documentation

## 2019-05-25 DIAGNOSIS — M47818 Spondylosis without myelopathy or radiculopathy, sacral and sacrococcygeal region: Secondary | ICD-10-CM

## 2019-05-25 MED ORDER — DEXAMETHASONE SODIUM PHOSPHATE 10 MG/ML IJ SOLN
10.0000 mg | Freq: Once | INTRAMUSCULAR | Status: AC
  Administered 2019-05-25: 10:00:00 10 mg
  Filled 2019-05-25: qty 1

## 2019-05-25 MED ORDER — IOHEXOL 180 MG/ML  SOLN: 10.0000 mL | Freq: Once | INTRAMUSCULAR | Status: DC

## 2019-05-25 MED ORDER — DEXAMETHASONE SODIUM PHOSPHATE 10 MG/ML IJ SOLN
10.0000 mg | Freq: Once | INTRAMUSCULAR | Status: AC
  Administered 2019-05-25: 10 mg
  Filled 2019-05-25: qty 1

## 2019-05-25 MED ORDER — ROPIVACAINE HCL 2 MG/ML IJ SOLN
1.0000 mL | Freq: Once | INTRAMUSCULAR | Status: AC
  Administered 2019-05-25: 1 mL via EPIDURAL
  Filled 2019-05-25: qty 10

## 2019-05-25 MED ORDER — LIDOCAINE HCL 2 % IJ SOLN
20.0000 mL | Freq: Once | INTRAMUSCULAR | Status: AC
  Administered 2019-05-25: 400 mg
  Filled 2019-05-25: qty 40

## 2019-05-25 NOTE — Progress Notes (Signed)
Patient's Name: Sarah [name]  MRN: [id number]  Referring Provider: Hortencia Pilar, MD  DOB: 03/26/1932  PCP: Hortencia Pilar, MD  DOS: 05/25/2019  Note by: Gillis Santa, MD  Service setting: Ambulatory outpatient  Specialty: Interventional Pain Management  Patient type: Established  Location: [facility] (AMB) Pain Management Facility  Visit type: Interventional Procedure   Primary Reason for Visit: Interventional Pain Management Treatment. CC: Hip Pain (bilateral)  Procedure:          Anesthesia, Analgesia, Anxiolysis:  Type: Therapeutic Sacroiliac Joint Steroid Injection #2 previously done 12/01/2018 Region: Inferior Lumbosacral Region Level: PIIS (Posterior Inferior Iliac Spine) Laterality: Bilateral    Local Anesthetic: Lidocaine 1-2%  Position: Prone           Indications: 1. SI joint arthritis   2. Chronic SI joint pain   3. Chronic pain syndrome    Pain Score: Pre-procedure: 5 /10 Post-procedure: 0-No pain/10  Pre-op Assessment:  Sarah [name] is a 85 y.o. (year old), female patient, seen today for interventional treatment. She  has a past surgical history that includes Abdominal hysterectomy and Breast biopsy (Bilateral). Sarah [name] has a current medication list which includes the following prescription(s): apoaequorin, beta carotene w/minerals, dicyclomine, levothyroxine, lovastatin, lumigan, melatonin, montelukast, pregabalin, restasis, sertraline, simvastatin, aspirin ec, gabapentin, hydroxyzine, levothyroxine, oxycodone-acetaminophen, phenazopyridine, and synthroid, and the following Facility-Administered Medications: iohexol. Her primarily concern today is the Hip Pain (bilateral)  Initial Vital Signs:  Pulse/HCG Rate: 73  Temp: 98.3 F (36.8 C) Resp: 18 BP: 128/77 SpO2: 97 %  BMI: Estimated body mass index is 23.42 kg/m as calculated from the following:   Height as of this encounter: 5\' 8"  (1.727 m).   Weight as of this encounter: 154 lb (69.9 kg).  Risk  Assessment: Allergies: Reviewed. She is allergic to codeine; hydrocodone; oxycodone; peanuts [peanut oil]; penicillins; tramadol; and vitamin d analogs.  Allergy Precautions: None required Coagulopathies: Reviewed. None identified.  Blood-thinner therapy: None at this time Active Infection(s): Reviewed. None identified. Sarah [name] is afebrile  Site Confirmation: Sarah [name] was asked to confirm the procedure and laterality before marking the site Procedure checklist: Completed Consent: Before the procedure and under the influence of no sedative(s), amnesic(s), or anxiolytics, the patient was informed of the treatment options, risks and possible complications. To fulfill our ethical and legal obligations, as recommended by the American Medical Association's Code of Ethics, I have informed the patient of my clinical impression; the nature and purpose of the treatment or procedure; the risks, benefits, and possible complications of the intervention; the alternatives, including doing nothing; the risk(s) and benefit(s) of the alternative treatment(s) or procedure(s); and the risk(s) and benefit(s) of doing nothing. The patient was provided information about the general risks and possible complications associated with the procedure. These may include, but are not limited to: failure to achieve desired goals, infection, bleeding, organ or nerve damage, allergic reactions, paralysis, and death. In addition, the patient was informed of those risks and complications associated to the procedure, such as failure to decrease pain; infection; bleeding; organ or nerve damage with subsequent damage to sensory, motor, and/or autonomic systems, resulting in permanent pain, numbness, and/or weakness of one or several areas of the body; allergic reactions; (i.e.: anaphylactic reaction); and/or death. Furthermore, the patient was informed of those risks and complications associated with the medications. These include, but  are not limited to: allergic reactions (i.e.: anaphylactic or anaphylactoid reaction(s)); adrenal axis suppression; blood sugar elevation that in diabetics may result in ketoacidosis or comma;  water retention that in patients with history of congestive heart failure may result in shortness of breath, pulmonary edema, and decompensation with resultant heart failure; weight gain; swelling or edema; medication-induced neural toxicity; particulate matter embolism and blood vessel occlusion with resultant organ, and/or nervous system infarction; and/or aseptic necrosis of one or more joints. Finally, the patient was informed that Medicine is not an exact science; therefore, there is also the possibility of unforeseen or unpredictable risks and/or possible complications that may result in a catastrophic outcome. The patient indicated having understood very clearly. We have given the patient no guarantees and we have made no promises. Enough time was given to the patient to ask questions, all of which were answered to the patient's satisfaction. Sarah [name] has indicated that she wanted to continue with the procedure. Attestation: I, the ordering provider, attest that I have discussed with the patient the benefits, risks, side-effects, alternatives, likelihood of achieving goals, and potential problems during recovery for the procedure that I have provided informed consent. Date  Time: 05/25/2019  9:15 AM  Pre-Procedure Preparation:  Monitoring: As per clinic protocol. Respiration, ETCO2, SpO2, BP, heart rate and rhythm monitor placed and checked for adequate function Safety Precautions: Patient was assessed for positional comfort and pressure points before starting the procedure. Time-out: I initiated and conducted the "Time-out" before starting the procedure, as per protocol. The patient was asked to participate by confirming the accuracy of the "Time Out" information. Verification of the correct person, site, and  procedure were performed and confirmed by me, the nursing staff, and the patient. "Time-out" conducted as per Joint Commission's Universal Protocol (UP.01.01.01). Time: 1005  Description of Procedure:          Target Area: Inferior, posterior, aspect of the sacroiliac fissure Approach: Posterior, paraspinal, ipsilateral approach. Area Prepped: Entire Lower Lumbosacral Region Prepping solution: ChloraPrep (2% chlorhexidine gluconate and 70% isopropyl alcohol) Safety Precautions: Aspiration looking for blood return was conducted prior to all injections. At no point did we inject any substances, as a needle was being advanced. No attempts were made at seeking any paresthesias. Safe injection practices and needle disposal techniques used. Medications properly checked for expiration dates. SDV (single dose vial) medications used. Description of the Procedure: Protocol guidelines were followed. The patient was placed in position over the procedure table. The target area was identified and the area prepped in the usual manner. Skin & deeper tissues infiltrated with local anesthetic. Appropriate amount of time allowed to pass for local anesthetics to take effect. The procedure needle was advanced under fluoroscopic guidance into the sacroiliac joint until a firm endpoint was obtained. Proper needle placement secured. Negative aspiration confirmed. Solution injected in intermittent fashion, asking for systemic symptoms every 0.5cc of injectate. The needles were then removed and the area cleansed, making sure to leave some of the prepping solution back to take advantage of its long term bactericidal properties. Vitals:   05/25/19 0929 05/25/19 1000 05/25/19 1010 05/25/19 1020  BP: 128/77 (!) 151/82 (!) 147/86 (!) 150/84  Pulse: 73 63 64 62  Resp: 18 14 14 16   Temp: 98.3 F (36.8 C)     TempSrc: Oral     SpO2: 97%  100% 100%  Weight: 154 lb (69.9 kg)     Height: 5\' 8"  (1.727 m)       Start Time: 1005  hrs. End Time: 1018 hrs. Materials:  Needle(s) Type: Spinal Needle Gauge: 25G Length: 3.5-in Medication(s): Please see orders for medications and dosing details.  5 cc solution made of 4 cc of 0.2% ropivacaine, 1 cc of Decadron 10 mg/cc.  2.5 cc injected intra-articular, 2.5 cc injected periarticular for left sacroiliac joint 5 cc solution made of 4 cc of 0.2% ropivacaine, 1 cc of Decadron 10 mg/cc.  2.5 cc injected intra-articular, 2.5 cc injected periarticular for right sacroiliac joint Total steroid dose: 20 mg Decadron Imaging Guidance (Non-Spinal):          Type of Imaging Technique: Fluoroscopy Guidance (Non-Spinal) Indication(s): Assistance in needle guidance and placement for procedures requiring needle placement in or near specific anatomical locations not easily accessible without such assistance. Exposure Time: Please see nurses notes. Contrast: Before injecting any contrast, we confirmed that the patient did not have an allergy to iodine, shellfish, or radiological contrast. Once satisfactory needle placement was completed at the desired level, radiological contrast was injected. Contrast injected under live fluoroscopy. No contrast complications. See chart for type and volume of contrast used. Fluoroscopic Guidance: I was personally present during the use of fluoroscopy. "Tunnel Vision Technique" used to obtain the best possible view of the target area. Parallax error corrected before commencing the procedure. "Direction-depth-direction" technique used to introduce the needle under continuous pulsed fluoroscopy. Once target was reached, antero-posterior, oblique, and lateral fluoroscopic projection used confirm needle placement in all planes. Images permanently stored in EMR. Interpretation: I personally interpreted the imaging intraoperatively. Adequate needle placement confirmed in multiple planes. Appropriate spread of contrast into desired area was observed. No evidence of afferent or  efferent intravascular uptake. Permanent images saved into the patient's record.  Antibiotic Prophylaxis:   Anti-infectives (From admission, onward)   None     Indication(s): None identified  Post-operative Assessment:  Post-procedure Vital Signs:  Pulse/HCG Rate: 62  Temp: 98.3 F (36.8 C) Resp: 16 BP: (!) 150/84 SpO2: 100 %  EBL: None  Complications: No immediate post-treatment complications observed by team, or reported by patient.  Note: The patient tolerated the entire procedure well. A repeat set of vitals were taken after the procedure and the patient was kept under observation following institutional policy, for this type of procedure. Post-procedural neurological assessment was performed, showing return to baseline, prior to discharge. The patient was provided with post-procedure discharge instructions, including a section on how to identify potential problems. Should any problems arise concerning this procedure, the patient was given instructions to immediately contact us, at any time, without hesitation. In any case, we plan to contact the patient by telephone for a follow-up status report regarding this interventional procedure.  Comments:  No additional relevant information.  Plan of Care  Orders:  Orders Placed This Encounter  Procedures  . DG PAIN CLINIC C-ARM 1-60 MIN NO REPORT    Intraoperative interpretation by procedural physician at [place].    Standing Status:   Standing    Number of Occurrences:   1    Order Specific Question:   Reason for exam:    Answer:   Assistance in needle guidance and placement for procedures requiring needle placement in or near specific anatomical locations not easily accessible without such assistance.   Medications ordered for procedure: Meds ordered this encounter  Medications  . iohexol (OMNIPAQUE) 180 MG/ML injection 10 mL    Must be Myelogram-compatible. If not available, you may substitute with a  water-soluble, non-ionic, hypoallergenic, myelogram-compatible radiological contrast medium.  Marland Kitchen lidocaine (XYLOCAINE) 2 % (with pres) injection 400 mg  . ropivacaine (PF) 2 mg/mL (0.2%) (NAROPIN) injection 1 mL  . dexamethasone (  DECADRON) injection 10 mg  . dexamethasone (DECADRON) injection 10 mg   Medications administered: We administered lidocaine, ropivacaine (PF) 2 mg/mL (0.2%), dexamethasone, and dexamethasone.  See the medical record for exact dosing, route, and time of administration.  Disposition: Discharge home  Discharge Date & Time: 05/25/2019; 1024 hrs.   Follow-up plan:   Return in about 4 weeks (around 06/22/2019) for Post Procedure Evaluation, virtual.     Future Appointments  Date Time Provider [place]  06/10/2019  2:30 PM Hollice Espy, MD [facility] None  06/23/2019  1:30 PM Gillis Santa, MD [facility] None   Primary Care Physician: Hortencia Pilar, MD Location: [facility] Facility Note by: Gillis Santa, MD Date: 05/25/2019; Time: 4:15 PM  Disclaimer:  Medicine is not an exact science. The only guarantee in medicine is that nothing is guaranteed. It is important to note that the decision to proceed with this intervention was based on the information collected from the patient. The Data and conclusions were drawn from the patient's questionnaire, the interview, and the physical examination. Because the information was provided in large part by the patient, it cannot be guaranteed that it has not been purposely or unconsciously manipulated. Every effort has been made to obtain as much relevant data as possible for this evaluation. It is important to note that the conclusions that lead to this procedure are derived in large part from the available data. Always take into account that the treatment will also be dependent on availability of resources and existing treatment guidelines, considered by other Pain Management Practitioners as being common  knowledge and practice, at the time of the intervention. For Medico-Legal purposes, it is also important to point out that variation in procedural techniques and pharmacological choices are the acceptable norm. The indications, contraindications, technique, and results of the above procedure should only be interpreted and judged by a Board-Certified Interventional Pain Specialist with extensive familiarity and expertise in the same exact procedure and technique.

## 2019-05-25 NOTE — Patient Instructions (Signed)

## 2019-05-25 NOTE — Progress Notes (Signed)
Safety precautions to be maintained throughout the outpatient stay will include: orient to surroundings, keep bed in low position, maintain call bell within reach at all times, provide assistance with transfer out of bed and ambulation.   Wasted #30 oxycodone in waste container in front of patient and witness Burnett Harry RN

## 2019-05-26 ENCOUNTER — Telehealth: Payer: Self-pay | Admitting: *Deleted

## 2019-05-26 NOTE — Telephone Encounter (Signed)
Spoke with patient re; procedure on yesterday. Denies any questions or concerns.  

## 2019-06-07 ENCOUNTER — Ambulatory Visit: Payer: Self-pay | Admitting: Urology

## 2019-06-07 DIAGNOSIS — M79609 Pain in unspecified limb: Secondary | ICD-10-CM | POA: Insufficient documentation

## 2019-06-07 DIAGNOSIS — M1710 Unilateral primary osteoarthritis, unspecified knee: Secondary | ICD-10-CM | POA: Insufficient documentation

## 2019-06-07 DIAGNOSIS — M179 Osteoarthritis of knee, unspecified: Secondary | ICD-10-CM | POA: Insufficient documentation

## 2019-06-07 DIAGNOSIS — F325 Major depressive disorder, single episode, in full remission: Secondary | ICD-10-CM | POA: Insufficient documentation

## 2019-06-07 DIAGNOSIS — M545 Low back pain, unspecified: Secondary | ICD-10-CM | POA: Insufficient documentation

## 2019-06-07 DIAGNOSIS — G5600 Carpal tunnel syndrome, unspecified upper limb: Secondary | ICD-10-CM | POA: Insufficient documentation

## 2019-06-10 ENCOUNTER — Other Ambulatory Visit
Admission: RE | Admit: 2019-06-10 | Discharge: 2019-06-10 | Disposition: A | Discharge status: Home / Self Care | Payer: Medicare Other | Attending: Student in an Organized Health Care Education/Training Program | Admitting: Student in an Organized Health Care Education/Training Program

## 2019-06-10 ENCOUNTER — Other Ambulatory Visit: Payer: Self-pay

## 2019-06-10 ENCOUNTER — Encounter: Payer: Self-pay | Admitting: Urology

## 2019-06-10 ENCOUNTER — Ambulatory Visit (INDEPENDENT_AMBULATORY_CARE_PROVIDER_SITE_OTHER): Payer: Medicare Other | Admitting: Urology

## 2019-06-10 VITALS — BP 107/65 | HR 83 | Ht 68.0 in | Wt 155.0 lb

## 2019-06-10 DIAGNOSIS — N952 Postmenopausal atrophic vaginitis: Secondary | ICD-10-CM

## 2019-06-10 DIAGNOSIS — R300 Dysuria: Secondary | ICD-10-CM | POA: Diagnosis not present

## 2019-06-10 DIAGNOSIS — N3281 Overactive bladder: Secondary | ICD-10-CM

## 2019-06-10 LAB — URINALYSIS, COMPLETE (UACMP) WITH MICROSCOPIC
Bacteria, UA: NONE SEEN
Bilirubin Urine: NEGATIVE
Glucose, UA: NEGATIVE mg/dL
Hgb urine dipstick: NEGATIVE
Ketones, ur: NEGATIVE mg/dL
Leukocytes,Ua: NEGATIVE
Nitrite: NEGATIVE
Protein, ur: NEGATIVE mg/dL
Specific Gravity, Urine: 1.015 (ref 1.005–1.030)
WBC, UA: NONE SEEN WBC/hpf (ref 0–5)
pH: 5.5 (ref 5.0–8.0)

## 2019-06-10 LAB — BLADDER SCAN AMB NON-IMAGING

## 2019-06-10 MED ORDER — PREMARIN 0.625 MG/GM VA CREA: TOPICAL_CREAM | VAGINAL | 12 refills | Status: DC

## 2019-06-10 MED ORDER — MIRABEGRON ER 25 MG PO TB24: 25.0000 mg | ORAL_TABLET | Freq: Every day | ORAL | 3 refills | Status: DC

## 2019-06-10 MED ORDER — PREMARIN 0.625 MG/GM VA CREA: 1.0000 | TOPICAL_CREAM | Freq: Every day | VAGINAL | 12 refills | Status: DC

## 2019-06-10 MED ORDER — MIRABEGRON ER 25 MG PO TB24: 25.0000 mg | ORAL_TABLET | Freq: Every day | ORAL | 11 refills | Status: DC

## 2019-06-10 NOTE — Progress Notes (Signed)
06/10/2019 3:01 PM   Sarah [name] [birth year] [id number]  Referring provider: Hortencia Pilar, [address] [place],  [place] [address]  Chief Complaint  Patient presents with  . Over Active Bladder    New Patient    HPI: 85 year old female who presents today to discuss urinary symptoms.  She reports that about a month ago, she began experiencing lower abdominal pelvic pain and pressure.  She also had associated worsening urinary frequency, urgency, and dysuria.  She is.  She is treated for presumed urinary tract infection although this did not help with her symptoms.  Her last documented urinary tract infection was E. coli in 2018.  She had 2 subsequent urinalysis over the past month each of which were essentially unremarkable growing only mixed flora.  Today, her lower abdominal pressure is still present but it is less pronounced.  Is unrelated to voiding.  She also reports that her dysuria is also improving slightly.  She does have baseline overactivity.  She reports that she is always been a person who goes to the bathroom whenever they have the opportunity.  She gets up at night a couple times to urinate which is also her baseline.  She is also had increasing episodes where she cannot get to the bathroom on time and will have accidents.  This is infrequent but are very bothersome to her.  She wears a peri-pad for this.  She is status post hysterectomy and bilateral oophorectomy.  She did have a bladder sling placed about 19 years ago at [place].  She denies any significant ongoing stress incontinence, occasionally gets with strenuous activity but not bothersome.  No gross hematuria.  She does have IBS.  She says she alternates between loose stools and constipation.  She is very aggressive at avoiding constipation eating prunes and taking MiraLAX if she becomes constipated.  She is not sexually active.   PMH: Past Medical History:  Diagnosis Date  . Benign  essential HTN 09/21/2015  . Cancer ([place])    Skin CA resected from Right eyelid and both legs.  . Seizures ([place])   . Thyroid disease     Surgical History: Past Surgical History:  Procedure Laterality Date  . ABDOMINAL HYSTERECTOMY    . BREAST BIOPSY Bilateral    neg    Home Medications:  Allergies as of 06/10/2019      Reactions   Codeine Other (See Comments)   Makes her disoriented   Hydrocodone Other (See Comments)   Patient states she was seeing things that weren't there and very disoriented.   Oxycodone Other (See Comments)   hallucinations   Peanuts [peanut Oil] Other (See Comments)   Violent Migraines   Penicillins Nausea Only   Tramadol Other (See Comments)   Dizziness.   Vitamin D Analogs Nausea Only      Medication List       Accurate as of June 10, 2019  3:01 PM. If you have any questions, ask your nurse or doctor.        STOP taking these medications   aspirin EC 81 MG tablet Stopped by: Hollice Espy, MD   gabapentin 100 MG capsule Commonly known as: NEURONTIN Stopped by: Hollice Espy, MD     TAKE these medications   beta carotene w/minerals tablet Take 1 tablet by mouth daily.   dicyclomine 10 MG capsule Commonly known as: BENTYL Take 10 mg by mouth 4 (four) times daily -  before meals and at bedtime.  hydrOXYzine 10 MG tablet Commonly known as: ATARAX/VISTARIL Take 1 tablet by mouth as needed.   lovastatin 20 MG tablet Commonly known as: MEVACOR Take 20 mg by mouth at bedtime.   Lumigan 0.01 % Soln Generic drug: bimatoprost Place 1 drop into the left eye at bedtime.   Melatonin 1 MG Tabs Take 10 mg by mouth once.   mirabegron ER 25 MG Tb24 tablet Commonly known as: MYRBETRIQ Take 1 tablet (25 mg total) by mouth daily. Started by: Hollice Espy, MD   montelukast 10 MG tablet Commonly known as: SINGULAIR Take 10 mg by mouth at bedtime.   phenazopyridine 97 MG tablet Commonly known as: PYRIDIUM Take 97 mg by mouth  once.   pregabalin 100 MG capsule Commonly known as: LYRICA Take 100 mg by mouth 3 (three) times daily. And one at HS   Premarin vaginal cream Generic drug: conjugated estrogens Place 1 Applicatorful vaginally daily. Use pea sized amount M-W-Fr before bedtime Started by: Hollice Espy, MD   PREVAGEN PO Take by mouth once.   Restasis 0.05 % ophthalmic emulsion Generic drug: cycloSPORINE Apply 1 drop to eye 2 (two) times daily.   sertraline 50 MG tablet Commonly known as: ZOLOFT Take 1 tablet by mouth daily.   simvastatin 10 MG tablet Commonly known as: ZOCOR Take 10 mg by mouth at bedtime.   Synthroid 112 MCG tablet Generic drug: levothyroxine Take 1 tablet by mouth daily. What changed: Another medication with the same name was removed. Continue taking this medication, and follow the directions you see here. Changed by: Hollice Espy, MD       Allergies:  Allergies  Allergen Reactions  . Codeine Other (See Comments)    Makes her disoriented  . Hydrocodone Other (See Comments)    Patient states she was seeing things that weren't there and very disoriented.  . Oxycodone Other (See Comments)    hallucinations  . Peanuts [Peanut Oil] Other (See Comments)    Violent Migraines  . Penicillins Nausea Only  . Tramadol Other (See Comments)    Dizziness.  . Vitamin D Analogs Nausea Only    Family History: Family History  Problem Relation Age of Onset  . Breast cancer Mother 53  . Breast cancer Maternal Uncle 60  . Breast cancer Maternal Aunt        mat great aunt  . Breast cancer Maternal Aunt 12  . Tracheal cancer Father   . Cancer Sister   . Brain cancer Sister   . Cancer Brother     Social History:  reports that she has never smoked. She has never used smokeless tobacco. She reports that she does not drink alcohol. No history on file for drug.  ROS: UROLOGY Frequent Urination?: Yes Hard to postpone urination?: No Burning/pain with urination?: No Get  up at night to urinate?: Yes Leakage of urine?: Yes Urine stream starts and stops?: No Trouble starting stream?: No Do you have to strain to urinate?: No Blood in urine?: No Urinary tract infection?: Yes Sexually transmitted disease?: No Injury to kidneys or bladder?: No Painful intercourse?: No Weak stream?: No Currently pregnant?: No Vaginal bleeding?: No Last menstrual period?: n  Gastrointestinal Nausea?: No Vomiting?: No Indigestion/heartburn?: Yes Diarrhea?: Yes Constipation?: Yes  Constitutional Fever: No Night sweats?: No Weight loss?: No Fatigue?: No  Skin Skin rash/lesions?: No Itching?: No  Eyes Blurred vision?: No Double vision?: No  Ears/Nose/Throat Sore throat?: No Sinus problems?: Yes  Hematologic/Lymphatic Swollen glands?: No Easy bruising?: Yes  Cardiovascular Leg swelling?: No Chest pain?: No  Respiratory Cough?: No Shortness of breath?: No  Endocrine Excessive thirst?: No  Musculoskeletal Back pain?: Yes Joint pain?: Yes  Neurological Headaches?: No Dizziness?: No  Psychologic Depression?: No Anxiety?: No  Physical Exam: BP 107/65   Pulse 83   Ht 5\' 8"  (1.727 m)   Wt 155 lb (70.3 kg)   BMI 23.57 kg/m   Constitutional:  Alert and oriented, No acute distress. HEENT: [place] AT, moist mucus membranes.  Trachea midline, no masses. Cardiovascular: No clubbing, cyanosis, or edema. Respiratory: Normal respiratory effort, no increased work of breathing. GI: Abdomen is soft, nontender, nondistended, no abdominal masses Pelvic: Chaperoned by Fonnie Jarvis, CMA.  Normal external genitalia.  Slightly capacious urethral meatus with surrounding atrophy, no significant hypermobility with Valsalva no demonstrable stress urinary continence.  Atrophic vaginitis appreciated.  Excellent vaginal support without any significant pelvic organ prolapse. Skin: No rashes, bruises or suspicious lesions. Neurologic: Grossly intact, no focal deficits,  moving all 4 extremities. Psychiatric: Normal mood and affect.  Laboratory Data: Lab Results  Component Value Date   WBC 5.1 09/14/2015   HGB 13.2 09/14/2015   HCT 38.3 09/14/2015   MCV 88.1 09/14/2015   PLT 177 09/14/2015    Lab Results  Component Value Date   CREATININE 0.80 09/14/2015    Urinalysis Pending  Pertinent Imaging: Results for orders placed or performed in visit on 06/10/19  BLADDER SCAN AMB NON-IMAGING  Result Value Ref Range   Scan Result 6ml     Assessment & Plan:    1. OAB (overactive bladder) Baseline OAB, etiology of recent exacerbation unclear Do not suspect that she ever had a urinary tract infection, will repeat urinalysis today Adequate bladder emptying which she reassuring We will try to treat her overactivity symptoms and reassess Good bowel hygiene encouraged - BLADDER SCAN AMB NON-IMAGING  2. Dysuria May be related to #3 Given sample of topical estrogen cream, pea-sized amount Monday, Wednesday Friday per urethral meatus Discussed risks of medication and how to apply Recheck in 3 months  3. Atrophic vaginitis As above    Return in about 3 months (around 09/09/2019) for shannon mebane recheck urinary symptoms.  Hollice Espy, MD  [facility] [address] [place], [place] [address] [phone]

## 2019-06-10 NOTE — Addendum Note (Signed)
Addended by: Tommy Rainwater on: 06/10/2019 03:21 PM   Modules accepted: Orders

## 2019-06-22 ENCOUNTER — Encounter: Payer: Self-pay | Admitting: Student in an Organized Health Care Education/Training Program

## 2019-06-23 ENCOUNTER — Encounter: Payer: Self-pay | Admitting: Student in an Organized Health Care Education/Training Program

## 2019-06-23 ENCOUNTER — Other Ambulatory Visit: Payer: Self-pay

## 2019-06-23 ENCOUNTER — Ambulatory Visit
Payer: Medicare Other | Attending: Student in an Organized Health Care Education/Training Program | Admitting: Student in an Organized Health Care Education/Training Program

## 2019-06-23 DIAGNOSIS — G894 Chronic pain syndrome: Secondary | ICD-10-CM

## 2019-06-23 DIAGNOSIS — M533 Sacrococcygeal disorders, not elsewhere classified: Secondary | ICD-10-CM

## 2019-06-23 DIAGNOSIS — M47818 Spondylosis without myelopathy or radiculopathy, sacral and sacrococcygeal region: Secondary | ICD-10-CM

## 2019-06-23 DIAGNOSIS — G8929 Other chronic pain: Secondary | ICD-10-CM | POA: Diagnosis not present

## 2019-06-23 NOTE — Progress Notes (Signed)
Pain Management Virtual Encounter Note - Virtual Visit via [place] (real-time audio visits between healthcare provider and patient).   Patient's Phone No. & Preferred Pharmacy:  [phone] (home); [phone] (mobile); (Preferred) [phone] [email or web address]  [id number] Shari Prows, [place] [address] Phone: [phone] Fax: [phone]  [facility] [place], [place] [address] [place] [address] Phone: [phone] Fax: [phone]    Pre-screening note:  Our staff contacted Sarah [name] and offered her an "in person", "face-to-face" appointment versus a telephone encounter. She indicated preferring the telephone encounter, at this time.   Reason for Virtual Visit: COVID-19*  Social distancing based on CDC and AMA recommendations.   I contacted Sarah [name] on 06/23/2019 via video conference.      I clearly identified myself as Gillis Santa, MD. I verified that I was speaking with the correct person using two identifiers (Name: Sarah [name], and date of birth: [birth year]).  Advanced Informed Consent I sought verbal advanced consent from Sarah [name] for virtual visit interactions. I informed Sarah [name] of possible security and privacy concerns, risks, and limitations associated with providing "not-in-person" medical evaluation and management services. I also informed Sarah [name] of the availability of "in-person" appointments. Finally, I informed her that there would be a charge for the virtual visit and that she could be  personally, fully or partially, financially responsible for it. Sarah [name] expressed understanding and agreed to proceed.   Historic Elements   Sarah [name] is a 85 y.o. year old, female patient evaluated today after her last encounter by our practice on 05/26/2019. Sarah [name]  has a past medical  history of Benign essential HTN (09/21/2015), Cancer ([place]), Seizures ([place]), and Thyroid disease. She also  has a past surgical history that includes Abdominal hysterectomy and Breast biopsy (Bilateral). Sarah [name] has a current medication list which includes the following prescription(s): apoaequorin, beta carotene w/minerals, dicyclomine, hydroxyzine, lovastatin, lumigan, melatonin, mirabegron er, pregabalin, restasis, sertraline, simvastatin, synthroid, premarin, montelukast, and phenazopyridine. She  reports that she has never smoked. She has never used smokeless tobacco. She reports that she does not drink alcohol. No history on file for drug. Sarah [name] is allergic to codeine; hydrocodone; oxycodone; peanuts [peanut oil]; penicillins; and tramadol.   HPI  Today, she is being contacted for a post-procedure assessment.  Evaluation of last interventional procedure  05/25/2019 Procedure:  Type: Therapeutic Sacroiliac Joint Steroid Injection #2 previously done 12/01/2018 Region: Inferior Lumbosacral Region Level: PIIS (Posterior Inferior Iliac Spine) Laterality: Bilateral  Influential Factors: Intra-procedural challenges: None observed.         Reported side-effects: None.        Post-procedural adverse reactions or complications: None reported         Sedation: Please see nurses note for DOS. When no sedatives are used, the analgesic levels obtained are directly associated to the effectiveness of the local anesthetics. However, when sedation is provided, the level of analgesia obtained during the initial 1 hour following the intervention, is believed to be the result of a combination of factors. These factors may include, but are not limited to: 1. The effectiveness of the local anesthetics used. 2. The effects of the analgesic(s) and/or anxiolytic(s) used. 3. The degree of discomfort experienced by the patient at the time of the procedure. 4. The patients ability and reliability in recalling and  recording the events. 5.  The presence and influence of possible secondary gains and/or psychosocial factors. Reported result: Relief experienced during the 1st hour after the procedure: 100%    (Ultra-Short Term Relief)            Interpretative annotation: Clinically appropriate result. Analgesia during this period is likely to be Local Anesthetic and/or IV Sedative (Analgesic/Anxiolytic) related.          Effects of local anesthetic: The analgesic effects attained during this period are directly associated to the localized infiltration of local anesthetics and therefore cary significant diagnostic value as to the etiological location, or anatomical origin, of the pain. Expected duration of relief is directly dependent on the pharmacodynamics of the local anesthetic used. Long-acting (4-6 hours) anesthetics used.  Reported result: Relief during the next 4 to 6 hour after the procedure: 100%   (Short-Term Relief)            Interpretative annotation: Clinically appropriate result. Analgesia during this period is likely to be Local Anesthetic-related.          Long-term benefit: Defined as the period of time past the expected duration of local anesthetics (1 hour for short-acting and 4-6 hours for long-acting). With the possible exception of prolonged sympathetic blockade from the local anesthetics, benefits during this period are typically attributed to, or associated with, other factors such as analgesic sensory neuropraxia, antiinflammatory effects, or beneficial biochemical changes provided by agents other than the local anesthetics.  Reported result: Extended relief following procedure: 100% pain relief for approximately 14 days in her low back and buttock pain with gradual return of pain thereafter.  Still doing better than she was prior to her injection.   (Long-Term Relief)            Interpretative annotation: Clinically appropriate result. Good relief. No permanent benefit expected. Inflammation  plays a part in the etiology to the pain.         Laboratory Chemistry Profile (12 mo)  Renal: No results found for requested labs within last 8760 hours.  Lab Results  Component Value Date   GFRAA >60 09/14/2015   GFRNONAA >60 09/14/2015   Hepatic: No results found for requested labs within last 8760 hours. Lab Results  Component Value Date   AST 19 11/26/2015   ALT 15 11/26/2015   Other: No results found for requested labs within last 8760 hours. Note: Above Lab results reviewed.   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 Diagnoses of Chronic SI joint pain and Chronic pain syndrome were also pertinent to this visit.  Plan of Care  I am having Sarah [name] maintain her Lumigan, Restasis, simvastatin, sertraline, Synthroid, hydrOXYzine, beta carotene w/minerals, montelukast, pregabalin, dicyclomine, lovastatin, Apoaequorin (PREVAGEN PO), phenazopyridine, Melatonin, Premarin, and mirabegron ER.  Orders:  Orders Placed This Encounter  Procedures  . SACROILIAC JOINT INJECTION    Standing Status:   Future    Standing Expiration Date:   07/24/2019    Scheduling Instructions:     Side: Bilateral     Sedation: without     Timeframe: 4 weeks    Order Specific Question:   Where will this procedure be performed?    Answer:   [facility]   Follow-up plan:   Return in about 5 weeks (around 07/28/2019) for Procedure B/L SI-J #3 , without sedation.     Finds benefit with bilateral sacroiliac joint injection, previous one 03/14/2019 and then second 1 05/25/2019.  Also finds lumbar epidural steroid  injections for when she has sciatic flare, consider repeating.  Previous one performed on 10/13/2018 at [facility].    Recent Visits Date Type Provider Dept  05/25/19 Procedure visit Gillis Santa, MD [facility]  05/10/19 Office Visit Gillis Santa, MD [facility]  Showing recent visits within past 90 days and meeting all other requirements    Today's Visits Date Type Provider Dept  06/23/19 Office Visit Gillis Santa, MD [facility]  Showing today's visits and meeting all other requirements   Future Appointments No visits were found meeting these conditions.  Showing future appointments within next 90 days and meeting all other requirements   I discussed the assessment and treatment plan with the patient. The patient was provided an opportunity to ask questions and all were answered. The patient agreed with the plan and demonstrated an understanding of the instructions.  Patient advised to call back or seek an in-person evaluation if the symptoms or condition worsens.  Total duration of non-face-to-face encounter: 76minutes.  Note by: Gillis Santa, MD Date: 06/23/2019; Time: 1:42 PM  Note: This dictation was prepared with Dragon dictation. Any transcriptional errors that may result from this process are unintentional.  Disclaimer:  * Given the special circumstances of the COVID-19 pandemic, the federal government has announced that the Office for Civil Rights (OCR) will exercise its enforcement discretion and will not impose penalties on physicians using telehealth in the event of noncompliance with regulatory requirements under the [place] (HIPAA) in connection with the good faith provision of telehealth during the XX123456 national public health emergency. ([place])

## 2019-06-27 ENCOUNTER — Ambulatory Visit: Payer: Self-pay | Admitting: Urology

## 2019-07-19 ENCOUNTER — Other Ambulatory Visit: Payer: Self-pay | Admitting: Student in an Organized Health Care Education/Training Program

## 2019-07-19 DIAGNOSIS — M47818 Spondylosis without myelopathy or radiculopathy, sacral and sacrococcygeal region: Secondary | ICD-10-CM

## 2019-07-27 ENCOUNTER — Ambulatory Visit
Admission: RE | Admit: 2019-07-27 | Discharge: 2019-07-27 | Disposition: A | Discharge status: Home / Self Care | Payer: Medicare Other | Source: Ambulatory Visit | Attending: Student in an Organized Health Care Education/Training Program | Admitting: Student in an Organized Health Care Education/Training Program

## 2019-07-27 ENCOUNTER — Encounter: Payer: Self-pay | Admitting: Student in an Organized Health Care Education/Training Program

## 2019-07-27 ENCOUNTER — Other Ambulatory Visit: Payer: Self-pay

## 2019-07-27 ENCOUNTER — Ambulatory Visit (HOSPITAL_BASED_OUTPATIENT_CLINIC_OR_DEPARTMENT_OTHER): Payer: Medicare Other | Admitting: Student in an Organized Health Care Education/Training Program

## 2019-07-27 VITALS — BP 151/77 | HR 70 | Temp 97.3°F | Resp 16 | Ht 68.0 in | Wt 155.0 lb

## 2019-07-27 DIAGNOSIS — M47818 Spondylosis without myelopathy or radiculopathy, sacral and sacrococcygeal region: Secondary | ICD-10-CM

## 2019-07-27 DIAGNOSIS — M25551 Pain in right hip: Secondary | ICD-10-CM | POA: Diagnosis present

## 2019-07-27 DIAGNOSIS — G8929 Other chronic pain: Secondary | ICD-10-CM

## 2019-07-27 DIAGNOSIS — M25552 Pain in left hip: Secondary | ICD-10-CM | POA: Insufficient documentation

## 2019-07-27 DIAGNOSIS — M533 Sacrococcygeal disorders, not elsewhere classified: Secondary | ICD-10-CM | POA: Insufficient documentation

## 2019-07-27 MED ORDER — IOHEXOL 180 MG/ML  SOLN
10.0000 mL | Freq: Once | INTRAMUSCULAR | Status: AC
  Administered 2019-07-27: 10 mL via EPIDURAL
  Filled 2019-07-27: qty 20

## 2019-07-27 MED ORDER — DEXAMETHASONE SODIUM PHOSPHATE 10 MG/ML IJ SOLN
INTRAMUSCULAR | Status: AC
  Filled 2019-07-27: qty 1

## 2019-07-27 MED ORDER — LIDOCAINE HCL 2 % IJ SOLN
INTRAMUSCULAR | Status: AC
  Filled 2019-07-27: qty 20

## 2019-07-27 MED ORDER — ROPIVACAINE HCL 2 MG/ML IJ SOLN
1.0000 mL | Freq: Once | INTRAMUSCULAR | Status: AC
  Administered 2019-07-27: 1 mL via EPIDURAL

## 2019-07-27 MED ORDER — DEXAMETHASONE SODIUM PHOSPHATE 10 MG/ML IJ SOLN
10.0000 mg | Freq: Once | INTRAMUSCULAR | Status: AC
  Administered 2019-07-27: 10 mg

## 2019-07-27 MED ORDER — ROPIVACAINE HCL 2 MG/ML IJ SOLN
INTRAMUSCULAR | Status: AC
  Filled 2019-07-27: qty 10

## 2019-07-27 NOTE — Progress Notes (Signed)
Safety precautions to be maintained throughout the outpatient stay will include: orient to surroundings, keep bed in low position, maintain call bell within reach at all times, provide assistance with transfer out of bed and ambulation.  

## 2019-07-27 NOTE — Patient Instructions (Signed)

## 2019-07-27 NOTE — Progress Notes (Signed)
Patient's Name: Sarah [name]  MRN: [id number]  Referring Provider: Hortencia Pilar, MD  DOB: 22-Jan-1932  PCP: Hortencia Pilar, MD  DOS: 07/27/2019  Note by: Gillis Santa, MD  Service setting: Ambulatory outpatient  Specialty: Interventional Pain Management  Patient type: Established  Location: [facility] ([facility]  Visit type: Interventional Procedure   Primary Reason for Visit: Interventional Pain Management Treatment. CC: Hip Pain (bilateral )  Procedure:          Anesthesia, Analgesia, Anxiolysis:  Type: Therapeutic Sacroiliac Joint Steroid Injection #3 previously done 05/25/2019 Region: Inferior Lumbosacral Region Level: PIIS (Posterior Inferior Iliac Spine) Laterality: Bilateral    Local Anesthetic: Lidocaine 1-2%  Position: Prone           Indications: 1. SI joint arthritis   2. Chronic SI joint pain    Pain Score: Pre-procedure: 5 /10 Post-procedure: 5 /10  Pre-op Assessment:  Sarah [name] is a 85 y.o. (year old), female patient, seen today for interventional treatment. She  has a past surgical history that includes Abdominal hysterectomy and Breast biopsy (Bilateral). Sarah [name] has a current medication list which includes the following prescription(s): apoaequorin, beta carotene w/minerals, dicyclomine, hydroxyzine, lovastatin, lumigan, melatonin, mirabegron er, montelukast, phenazopyridine, pregabalin, restasis, sertraline, simvastatin, synthroid, and premarin. Her primarily concern today is the Hip Pain (bilateral )  Initial Vital Signs:  Pulse/HCG Rate: 76  Temp: (!) 97.3 F (36.3 C) Resp: 16 BP: 140/76 SpO2: 93 %  BMI: Estimated body mass index is 23.57 kg/m as calculated from the following:   Height as of this encounter: 5\' 8"  (1.727 m).   Weight as of this encounter: 155 lb (70.3 kg).  Risk Assessment: Allergies: Reviewed. She is allergic to codeine; hydrocodone; oxycodone; peanuts [peanut oil]; penicillins; and tramadol.  Allergy  Precautions: None required Coagulopathies: Reviewed. None identified.  Blood-thinner therapy: None at this time Active Infection(s): Reviewed. None identified. Sarah [name] is afebrile  Site Confirmation: Sarah [name] was asked to confirm the procedure and laterality before marking the site Procedure checklist: Completed Consent: Before the procedure and under the influence of no sedative(s), amnesic(s), or anxiolytics, the patient was informed of the treatment options, risks and possible complications. To fulfill our ethical and legal obligations, as recommended by the American Medical Association's Code of Ethics, I have informed the patient of my clinical impression; the nature and purpose of the treatment or procedure; the risks, benefits, and possible complications of the intervention; the alternatives, including doing nothing; the risk(s) and benefit(s) of the alternative treatment(s) or procedure(s); and the risk(s) and benefit(s) of doing nothing. The patient was provided information about the general risks and possible complications associated with the procedure. These may include, but are not limited to: failure to achieve desired goals, infection, bleeding, organ or nerve damage, allergic reactions, paralysis, and death. In addition, the patient was informed of those risks and complications associated to the procedure, such as failure to decrease pain; infection; bleeding; organ or nerve damage with subsequent damage to sensory, motor, and/or autonomic systems, resulting in permanent pain, numbness, and/or weakness of one or several areas of the body; allergic reactions; (i.e.: anaphylactic reaction); and/or death. Furthermore, the patient was informed of those risks and complications associated with the medications. These include, but are not limited to: allergic reactions (i.e.: anaphylactic or anaphylactoid reaction(s)); adrenal axis suppression; blood sugar elevation that in diabetics may result  in ketoacidosis or comma; water retention that in patients with history of congestive heart failure may result in shortness  of breath, pulmonary edema, and decompensation with resultant heart failure; weight gain; swelling or edema; medication-induced neural toxicity; particulate matter embolism and blood vessel occlusion with resultant organ, and/or nervous system infarction; and/or aseptic necrosis of one or more joints. Finally, the patient was informed that Medicine is not an exact science; therefore, there is also the possibility of unforeseen or unpredictable risks and/or possible complications that may result in a catastrophic outcome. The patient indicated having understood very clearly. We have given the patient no guarantees and we have made no promises. Enough time was given to the patient to ask questions, all of which were answered to the patient's satisfaction. Sarah [name] has indicated that she wanted to continue with the procedure. Attestation: I, the ordering provider, attest that I have discussed with the patient the benefits, risks, side-effects, alternatives, likelihood of achieving goals, and potential problems during recovery for the procedure that I have provided informed consent. Date  Time: 07/27/2019  8:45 AM  Pre-Procedure Preparation:  Monitoring: As per clinic protocol. Respiration, ETCO2, SpO2, BP, heart rate and rhythm monitor placed and checked for adequate function Safety Precautions: Patient was assessed for positional comfort and pressure points before starting the procedure. Time-out: I initiated and conducted the "Time-out" before starting the procedure, as per protocol. The patient was asked to participate by confirming the accuracy of the "Time Out" information. Verification of the correct person, site, and procedure were performed and confirmed by me, the nursing staff, and the patient. "Time-out" conducted as per Joint Commission's Universal Protocol  (UP.01.01.01). Time: 0923  Description of Procedure:          Target Area: Inferior, posterior, aspect of the sacroiliac fissure Approach: Posterior, paraspinal, ipsilateral approach. Area Prepped: Entire Lower Lumbosacral Region Prepping solution: ChloraPrep (2% chlorhexidine gluconate and 70% isopropyl alcohol) Safety Precautions: Aspiration looking for blood return was conducted prior to all injections. At no point did we inject any substances, as a needle was being advanced. No attempts were made at seeking any paresthesias. Safe injection practices and needle disposal techniques used. Medications properly checked for expiration dates. SDV (single dose vial) medications used. Description of the Procedure: Protocol guidelines were followed. The patient was placed in position over the procedure table. The target area was identified and the area prepped in the usual manner. Skin & deeper tissues infiltrated with local anesthetic. Appropriate amount of time allowed to pass for local anesthetics to take effect. The procedure needle was advanced under fluoroscopic guidance into the sacroiliac joint until a firm endpoint was obtained. Proper needle placement secured. Negative aspiration confirmed. Solution injected in intermittent fashion, asking for systemic symptoms every 0.5cc of injectate. The needles were then removed and the area cleansed, making sure to leave some of the prepping solution back to take advantage of its long term bactericidal properties. Vitals:   07/27/19 0851 07/27/19 0920 07/27/19 0930 07/27/19 0940  BP: 140/76 129/83 (!) 152/77 (!) 151/77  Pulse: 76 73 71 70  Resp: 16 13 15 16   Temp: (!) 97.3 F (36.3 C)     TempSrc: Temporal     SpO2: 93% 100% 99% 100%  Weight: 155 lb (70.3 kg)     Height: 5\' 8"  (1.727 m)       Start Time: 0924 hrs. End Time: 0936 hrs. Materials:  Needle(s) Type: Spinal Needle Gauge: 25G Length: 3.5-in Medication(s): Please see orders for  medications and dosing details. 5 cc solution made of 4.5 cc of 0.2% ropivacaine, 0.5 cc of Decadron 10  mg/cc.  2.5 cc injected intra-articular, 2.5 cc injected periarticular for left sacroiliac joint 5 cc solution made of 4.5 cc of 0.2% ropivacaine, 0.5 cc of Decadron 10 mg/cc.  2.5 cc injected intra-articular, 2.5 cc injected periarticular for right sacroiliac joint Total steroid dose: 10 mg Decadron Imaging Guidance (Non-Spinal):          Type of Imaging Technique: Fluoroscopy Guidance (Non-Spinal) Indication(s): Assistance in needle guidance and placement for procedures requiring needle placement in or near specific anatomical locations not easily accessible without such assistance. Exposure Time: Please see nurses notes. Contrast: Before injecting any contrast, we confirmed that the patient did not have an allergy to iodine, shellfish, or radiological contrast. Once satisfactory needle placement was completed at the desired level, radiological contrast was injected. Contrast injected under live fluoroscopy. No contrast complications. See chart for type and volume of contrast used. Fluoroscopic Guidance: I was personally present during the use of fluoroscopy. "Tunnel Vision Technique" used to obtain the best possible view of the target area. Parallax error corrected before commencing the procedure. "Direction-depth-direction" technique used to introduce the needle under continuous pulsed fluoroscopy. Once target was reached, antero-posterior, oblique, and lateral fluoroscopic projection used confirm needle placement in all planes. Images permanently stored in EMR. Interpretation: I personally interpreted the imaging intraoperatively. Adequate needle placement confirmed in multiple planes. Appropriate spread of contrast into desired area was observed. No evidence of afferent or efferent intravascular uptake. Permanent images saved into the patient's record.  Antibiotic Prophylaxis:   Anti-infectives  (From admission, onward)   None     Indication(s): None identified  Post-operative Assessment:  Post-procedure Vital Signs:  Pulse/HCG Rate: 70  Temp: (!) 97.3 F (36.3 C) Resp: 16 BP: (!) 151/77 SpO2: 100 %  EBL: None  Complications: No immediate post-treatment complications observed by team, or reported by patient.  Note: The patient tolerated the entire procedure well. A repeat set of vitals were taken after the procedure and the patient was kept under observation following institutional policy, for this type of procedure. Post-procedural neurological assessment was performed, showing return to baseline, prior to discharge. The patient was provided with post-procedure discharge instructions, including a section on how to identify potential problems. Should any problems arise concerning this procedure, the patient was given instructions to immediately contact us, at any time, without hesitation. In any case, we plan to contact the patient by telephone for a follow-up status report regarding this interventional procedure.  Comments:  No additional relevant information.  Plan of Care  Orders:  Orders Placed This Encounter  Procedures  . [facility] C-ARM 1-60 MIN NO REPORT    Intraoperative interpretation by procedural physician at [facility].    Standing Status:   Standing    Number of Occurrences:   1    Order Specific Question:   Reason for exam:    Answer:   Assistance in needle guidance and placement for procedures requiring needle placement in or near specific anatomical locations not easily accessible without such assistance.   Medications ordered for procedure: Meds ordered this encounter  Medications  . iohexol (OMNIPAQUE) 180 MG/ML injection 10 mL    Must be Myelogram-compatible. If not available, you may substitute with a water-soluble, non-ionic, hypoallergenic, myelogram-compatible radiological contrast medium.  . ropivacaine (PF) 2 mg/mL (0.2%)  (NAROPIN) injection 1 mL  . dexamethasone (DECADRON) injection 10 mg   Medications administered: We administered iohexol, ropivacaine (PF) 2 mg/mL (0.2%), and dexamethasone.  See the medical record for exact  dosing, route, and time of administration.  Disposition: Discharge home  Discharge Date & Time: 07/27/2019; 0943 hrs.   Follow-up plan:   Return in about 10 weeks (around 10/05/2019) for Post Procedure Evaluation, virtual.     Future Appointments  Date Time Provider [place]  09/05/2019  8:30 AM McGowan, Larene [place] A, PA-C [facility] None  10/04/2019  2:15 PM Gillis Santa, MD [facility] None   Primary Care Physician: Hortencia Pilar, MD Location[facility] Note by: Gillis Santa, MD Date: 07/27/2019; Time: 9:51 AM  Disclaimer:  Medicine is not an exact science. The only guarantee in medicine is that nothing is guaranteed. It is important to note that the decision to proceed with this intervention was based on the information collected from the patient. The Data and conclusions were drawn from the patient's questionnaire, the interview, and the physical examination. Because the information was provided in large part by the patient, it cannot be guaranteed that it has not been purposely or unconsciously manipulated. Every effort has been made to obtain as much relevant data as possible for this evaluation. It is important to note that the conclusions that lead to this procedure are derived in large part from the available data. Always take into account that the treatment will also be dependent on availability of resources and existing treatment guidelines, considered by other Pain Management Practitioners as being common knowledge and practice, at the time of the intervention. For Medico-Legal purposes, it is also important to point out that variation in procedural techniques and pharmacological choices are the acceptable norm. The indications, contraindications,  technique, and results of the above procedure should only be interpreted and judged by a Board-Certified Interventional Pain Specialist with extensive familiarity and expertise in the same exact procedure and technique.

## 2019-07-28 ENCOUNTER — Telehealth: Payer: Self-pay | Admitting: *Deleted

## 2019-07-28 NOTE — Telephone Encounter (Signed)
Spoke with patient re; procedure on yesterday, no questions or concerns.  

## 2019-07-28 NOTE — Telephone Encounter (Signed)
Attempted to call for post procedure follow-up. No answer, unable to leave a message. 

## 2019-08-16 ENCOUNTER — Telehealth: Payer: Self-pay | Admitting: *Deleted

## 2019-08-17 NOTE — Telephone Encounter (Signed)
Voicemail left with patient °

## 2019-08-22 ENCOUNTER — Encounter: Payer: Self-pay | Admitting: Student in an Organized Health Care Education/Training Program

## 2019-08-22 NOTE — Progress Notes (Signed)
Patient is unable to take oxycodone, has been brought back and destroyed by our nursing staff.

## 2019-08-22 NOTE — Telephone Encounter (Signed)
Patient lvmail 08-19-19 stating she wants to speak with Nurse. I tried to call back but no answer

## 2019-08-23 ENCOUNTER — Encounter: Payer: Self-pay | Admitting: Student in an Organized Health Care Education/Training Program

## 2019-08-23 ENCOUNTER — Other Ambulatory Visit: Payer: Self-pay

## 2019-08-23 ENCOUNTER — Ambulatory Visit
Payer: Medicare Other | Attending: Student in an Organized Health Care Education/Training Program | Admitting: Student in an Organized Health Care Education/Training Program

## 2019-08-23 DIAGNOSIS — M47816 Spondylosis without myelopathy or radiculopathy, lumbar region: Secondary | ICD-10-CM

## 2019-08-23 DIAGNOSIS — G894 Chronic pain syndrome: Secondary | ICD-10-CM

## 2019-08-23 DIAGNOSIS — M5416 Radiculopathy, lumbar region: Secondary | ICD-10-CM

## 2019-08-23 DIAGNOSIS — M533 Sacrococcygeal disorders, not elsewhere classified: Secondary | ICD-10-CM | POA: Diagnosis not present

## 2019-08-23 DIAGNOSIS — M47818 Spondylosis without myelopathy or radiculopathy, sacral and sacrococcygeal region: Secondary | ICD-10-CM

## 2019-08-23 DIAGNOSIS — G8929 Other chronic pain: Secondary | ICD-10-CM

## 2019-08-23 DIAGNOSIS — G629 Polyneuropathy, unspecified: Secondary | ICD-10-CM

## 2019-08-23 DIAGNOSIS — M5136 Other intervertebral disc degeneration, lumbar region: Secondary | ICD-10-CM

## 2019-08-23 MED ORDER — BELBUCA 150 MCG BU FILM: 1.0000 | ORAL_FILM | Freq: Two times a day (BID) | BUCCAL | 1 refills | Status: DC

## 2019-08-23 NOTE — Progress Notes (Signed)
Pain Management Virtual Encounter Note - Virtual Visit via [place] (real-time audio visits between healthcare provider and patient).   Patient's Phone No. & Preferred Pharmacy:  [phone] (home); [phone] (mobile); (Preferred) [phone] [email or web address]  [id number] [address], [place] [address] Phone: [phone] Fax: [phone]  [facility], [place] [address] [place] [address] Phone: [phone] Fax: [phone]    Pre-screening note:  Our staff contacted Sarah [name] and offered her an "in person", "face-to-face" appointment versus a telephone encounter. She indicated preferring the telephone encounter, at this time.   Reason for Virtual Visit: COVID-19*  Social distancing based on CDC and AMA recommendations.   I contacted Sarah [name] on 08/23/2019 via video conference.      I clearly identified myself as Gillis Santa, MD. I verified that I was speaking with the correct person using two identifiers (Name: Sarah [name], and date of birth: [birth year]).  Advanced Informed Consent I sought verbal advanced consent from Sarah [name] for virtual visit interactions. I informed Sarah [name] of possible security and privacy concerns, risks, and limitations associated with providing "not-in-person" medical evaluation and management services. I also informed Sarah [name] of the availability of "in-person" appointments. Finally, I informed her that there would be a charge for the virtual visit and that she could be  personally, fully or partially, financially responsible for it. Sarah [name] expressed understanding and agreed to proceed.   Historic Elements   Sarah [name] is a 85 y.o. year old, female patient evaluated today after her last encounter by our practice on 08/16/2019. Sarah [name]  has a past medical  history of Benign essential HTN (09/21/2015), Cancer ([place]), Seizures ([place]), and Thyroid disease. She also  has a past surgical history that includes Abdominal hysterectomy and Breast biopsy (Bilateral). Sarah [name] has a current medication list which includes the following prescription(s): apoaequorin, beta carotene w/minerals, dicyclomine, hydroxyzine, lovastatin, lumigan, melatonin, mirabegron er, montelukast, phenazopyridine, pregabalin, restasis, sertraline, simvastatin, synthroid, belbuca, and premarin. She  reports that she has never smoked. She has never used smokeless tobacco. She reports that she does not drink alcohol. No history on file for drug. Sarah [name] is allergic to codeine; hydrocodone; oxycodone; peanuts [peanut oil]; penicillins; and tramadol.   HPI  Today, she is being contacted for worsening of previously known (established) problem   Patient endorses severe and persistent low back, bilateral hip, buttock pain as well as left lateral thigh and leg pain.  Patient did have a bilateral sacroiliac joint injection performed on 07/27/2019 which she states was only moderately helpful for 1 to 2 weeks.  She is having worsening sensory ataxia and polyneuropathy which she is seeing neurology for.  She was started on Requip 0.25 mg nightly with titration instructions.  She is also on Lyrica 100 mg in the morning, 50 mg in the afternoon, 100 mg in the evening, 50 mg nightly per neurology.  Given increased pain in the context of having tried lumbar epidural steroid injections and SI joint injections, we discussed buprenorphine.  Risks and benefits of this medication were reviewed in detail.  I believe this will be a better fit for the patient than a controlled 2 opioid which she has experienced hallucinations from in the past.  Buprenorphine also has less side effect potential than other opioid analgesics.  Pharmacotherapy Assessment  PMP report reviewed and appropriate  Laboratory Chemistry Profile  (12 mo)  Renal: No results found for requested labs within last 8760 hours.  Lab Results  Component Value Date   GFRAA >60 09/14/2015   GFRNONAA >60 09/14/2015   Hepatic: No results found for requested labs within last 8760 hours. Lab Results  Component Value Date   AST 19 11/26/2015   ALT 15 11/26/2015   Other: No results found for requested labs within last 8760 hours. Note: Above Lab results reviewed.   Assessment  The primary encounter diagnosis was Chronic pain syndrome. Diagnoses of Chronic SI joint pain, SI joint arthritis, Lumbar radiculopathy, Lumbar degenerative disc disease, Neuropathy, and Lumbar facet arthropathy (L5/S1) were also pertinent to this visit.  Plan of Care   I am having Sarah [name] start on [place]. I am also having her maintain her Lumigan, Restasis, simvastatin, sertraline, Synthroid, hydrOXYzine, beta carotene w/minerals, montelukast, pregabalin, dicyclomine, lovastatin, Apoaequorin (PREVAGEN PO), phenazopyridine, Melatonin, Premarin, and mirabegron ER.  Pharmacotherapy (Medications Ordered): Meds ordered this encounter  Medications  . Buprenorphine HCl (BELBUCA) 150 MCG FILM    Sig: Place 1 Film inside cheek every 12 (twelve) hours.    Dispense:  60 each    Refill:  1   Follow-up plan:   Return for Keep sch. appt.     Finds benefit with bilateral sacroiliac joint injection, previous one 03/14/2019 and then second 1 05/25/2019.  Also finds lumbar epidural steroid injections for when she has sciatic flare, consider repeating.  Previous one performed on 10/13/2018 at [facility].     Recent Visits Date Type Provider Dept  07/27/19 Procedure visit Gillis Santa, MD [facility]  06/23/19 Office Visit Gillis Santa, MD [facility]  05/25/19 Procedure visit Gillis Santa, MD [facility]  Showing recent visits within past 90 days and meeting all other requirements   Today's Visits Date Type Provider Dept  08/23/19 Office  Visit Gillis Santa, MD [facility]  Showing today's visits and meeting all other requirements   Future Appointments Date Type Provider Dept  10/04/19 Appointment Gillis Santa, MD [facility]  Showing future appointments within next 90 days and meeting all other requirements   I discussed the assessment and treatment plan with the patient. The patient was provided an opportunity to ask questions and all were answered. The patient agreed with the plan and demonstrated an understanding of the instructions.  Patient advised to call back or seek an in-person evaluation if the symptoms or condition worsens.  Total duration of non-face-to-face encounter: 25 minutes.  Note by: Gillis Santa, MD Date: 08/23/2019; Time: 11:52 AM  Note: This dictation was prepared with Dragon dictation. Any transcriptional errors that may result from this process are unintentional.  Disclaimer:  * Given the special circumstances of the COVID-19 pandemic, the federal government has announced that the Office for Civil Rights (OCR) will exercise its enforcement discretion and will not impose penalties on physicians using telehealth in the event of noncompliance with regulatory requirements under the [place] and [place] (HIPAA) in connection with the good faith provision of telehealth during the XX123456 national public health emergency. ([place])

## 2019-09-04 NOTE — Progress Notes (Signed)
09/05/2019 10:25 AM   Sarah [name] December 28, 1931 [id number]  Referring provider: Hortencia Pilar, [place],  [place] [address]  Chief Complaint  Patient presents with  . Over Active Bladder    HPI: Sarah [name] is an 85 year old female with OAB, dysuria and vaginal atrophy who presents today for a three month follow up after a trial of vaginal estrogen cream.  She was see by Dr. Erlene Quan on 06/10/2019.  At that appointment, she admitted to baseline overactivity, nocturia x 2-3 and urge incontinence for which she wears pads.  She was found to have vaginal atrophy and placed on vaginal estrogen cream.    Today, the patient is experiencing urgency x 0-3, frequency x 4-7, not restricting fluids to avoid visits to the restroom, is engaging in toilet mapping, incontinence x 0-3 and nocturia x 0-3.   Her BP is 151/79.   Her PVR is 0 mL.   Myrbetriq 25 mg daily and avoiding fruit is helping to providing relief.   She continues to have dysuria at the start of the stream but then it abates.   Her fist UA grossly is yellow clear, trace blood and trace leukocytes on dip, and on microscopy 6-10 squamous epithelial, 21-50 WBC, 0-5 RBC and rare bacteria.   Her second UA is grossly yellow clear, trace blood, 0-5 squamous epithelial cells, 11-20 WBC, 0-5 RBC and rare bacteria.    She found that that the Premarin cream "burnt like fire," so she discontinued it after two weeks.    PMH: Past Medical History:  Diagnosis Date  . Benign essential HTN 09/21/2015  . Cancer ([place])    Skin CA resected from Right eyelid and both legs.  . Seizures ([place])   . Thyroid disease     Surgical History: Past Surgical History:  Procedure Laterality Date  . ABDOMINAL HYSTERECTOMY    . BREAST BIOPSY Bilateral    neg    Home Medications:  Allergies as of 09/05/2019      Reactions   Codeine Other (See Comments)   Makes her disoriented   Hydrocodone Other (See Comments)   Patient states  she was seeing things that weren't there and very disoriented.   Oxycodone Other (See Comments)   hallucinations   Peanuts [peanut Oil] Other (See Comments)   Violent Migraines   Penicillins Nausea Only   Tramadol Other (See Comments)   Dizziness.      Medication List       Accurate as of September 05, 2019 10:25 AM. If you have any questions, ask your nurse or doctor.        STOP taking these medications   Belbuca 150 MCG Film Generic drug: Buprenorphine HCl Stopped by: Zara Council, PA-C     TAKE these medications   beta carotene w/minerals tablet Take 1 tablet by mouth daily.   dicyclomine 10 MG capsule Commonly known as: BENTYL Take 10 mg by mouth 4 (four) times daily -  before meals and at bedtime.   hydrOXYzine 10 MG tablet Commonly known as: ATARAX/VISTARIL Take 1 tablet by mouth as needed.   lovastatin 20 MG tablet Commonly known as: MEVACOR Take 20 mg by mouth at bedtime.   Lumigan 0.01 % Soln Generic drug: bimatoprost Place 1 drop into the left eye at bedtime.   Melatonin 1 MG Tabs Take 10 mg by mouth once.   mirabegron ER 25 MG Tb24 tablet Commonly known as: MYRBETRIQ Take 1 tablet (25 mg  total) by mouth daily.   montelukast 10 MG tablet Commonly known as: SINGULAIR Take 10 mg by mouth at bedtime.   phenazopyridine 97 MG tablet Commonly known as: PYRIDIUM Take 97 mg by mouth once.   pregabalin 100 MG capsule Commonly known as: LYRICA Take 100 mg by mouth 3 (three) times daily. And one at HS   Premarin vaginal cream Generic drug: conjugated estrogens Use pea sized amount M-W-Fr before bedtime   PREVAGEN PO Take by mouth once.   Restasis 0.05 % ophthalmic emulsion Generic drug: cycloSPORINE Apply 1 drop to eye 2 (two) times daily.   sertraline 50 MG tablet Commonly known as: ZOLOFT Take 1 tablet by mouth daily.   simvastatin 10 MG tablet Commonly known as: ZOCOR Take 10 mg by mouth at bedtime.   Synthroid 112 MCG  tablet Generic drug: levothyroxine Take 1 tablet by mouth daily.       Allergies:  Allergies  Allergen Reactions  . Codeine Other (See Comments)    Makes her disoriented  . Hydrocodone Other (See Comments)    Patient states she was seeing things that weren't there and very disoriented.  . Oxycodone Other (See Comments)    hallucinations  . Peanuts [Peanut Oil] Other (See Comments)    Violent Migraines  . Penicillins Nausea Only  . Tramadol Other (See Comments)    Dizziness.    Family History: Family History  Problem Relation Age of Onset  . Breast cancer Mother 25  . Breast cancer Maternal Uncle 60  . Breast cancer Maternal Aunt        mat great aunt  . Breast cancer Maternal Aunt 20  . Tracheal cancer Father   . Cancer Sister   . Brain cancer Sister   . Cancer Brother     Social History:  reports that she has never smoked. She has never used smokeless tobacco. She reports that she does not drink alcohol. No history on file for drug.  ROS: UROLOGY Frequent Urination?: No Hard to postpone urination?: No Burning/pain with urination?: No Get up at night to urinate?: No Leakage of urine?: No Urine stream starts and stops?: No Trouble starting stream?: No Do you have to strain to urinate?: No Blood in urine?: No Urinary tract infection?: No Sexually transmitted disease?: No Injury to kidneys or bladder?: No Painful intercourse?: No Weak stream?: No Currently pregnant?: No Vaginal bleeding?: No Last menstrual period?: n  Gastrointestinal Nausea?: No Vomiting?: No Indigestion/heartburn?: No Diarrhea?: No Constipation?: No  Constitutional Fever: No Night sweats?: No Weight loss?: No Fatigue?: No  Skin Skin rash/lesions?: No Itching?: No  Eyes Blurred vision?: No Double vision?: No  Ears/Nose/Throat Sore throat?: No Sinus problems?: Yes  Hematologic/Lymphatic Swollen glands?: No Easy bruising?: Yes  Cardiovascular Leg swelling?:  No Chest pain?: No  Respiratory Cough?: No Shortness of breath?: No  Endocrine Excessive thirst?: No  Musculoskeletal Back pain?: Yes Joint pain?: Yes  Neurological Headaches?: No Dizziness?: No  Psychologic Depression?: No Anxiety?: No  Physical Exam: BP (!) 151/79   Pulse 85   Ht 5\' 8"  (1.727 m)   Wt 156 lb (70.8 kg)   BMI 23.72 kg/m   Constitutional:  Well nourished. Alert and oriented, No acute distress. HEENT: [place] AT, moist mucus membranes.  Trachea midline, no masses. Cardiovascular: No clubbing, cyanosis, or edema. Respiratory: Normal respiratory effort, no increased work of breathing. Neurologic: Grossly intact, no focal deficits, moving all 4 extremities. Psychiatric: Normal mood and affect.   Laboratory Data: Lab  Results  Component Value Date   WBC 5.1 09/14/2015   HGB 13.2 09/14/2015   HCT 38.3 09/14/2015   MCV 88.1 09/14/2015   PLT 177 09/14/2015    Lab Results  Component Value Date   CREATININE 0.80 09/14/2015    No results found for: PSA  No results found for: TESTOSTERONE  No results found for: HGBA1C  Lab Results  Component Value Date   TSH 6.05 (H) 02/15/2013    No results found for: CHOL, HDL, CHOLHDL, VLDL, LDLCALC  Lab Results  Component Value Date   AST 19 11/26/2015   Lab Results  Component Value Date   ALT 15 11/26/2015   No components found for: ALKALINEPHOPHATASE No components found for: BILIRUBINTOTAL  No results found for: ESTRADIOL  Urinalysis Component     Latest Ref Rng & Units 09/05/2019  Color, Urine     YELLOW YELLOW  Appearance     CLEAR CLEAR  Specific Gravity, Urine     1.005 - 1.030 1.015  pH     5.0 - 8.0 5.5  Glucose, UA     NEGATIVE mg/dL NEGATIVE  Hgb urine dipstick     NEGATIVE TRACE (A)  Bilirubin Urine     NEGATIVE NEGATIVE  Ketones, ur     NEGATIVE mg/dL NEGATIVE  Protein     NEGATIVE mg/dL NEGATIVE  Nitrite     NEGATIVE NEGATIVE  Leukocytes,Ua     NEGATIVE TRACE (A)   Squamous Epithelial / LPF     0 - 5 6-10  WBC, UA     0 - 5 WBC/hpf 21-50  RBC / HPF     0 - 5 RBC/hpf 0-5  Bacteria, UA     NONE SEEN RARE (A)   I have reviewed the labs.   Pertinent Imaging: Results for KIERSTYNN, JOE (MRN [id number]) as of 09/05/2019 09:48  Ref. Range 09/05/2019 09:24  Scan Result Unknown 0   Assessment & Plan:    1. Vaginal atrophy Could not tolerate the vaginal estrogen cream, so she has discontinued the medication  2. Dysuria  First UA contaminated with squamous epithelial cells - recommended a CATH UA, but patient refused Second UA with 11-20 WBC and 0-5 squamous epithelial cells - will send for culture to rule out indolent infection   3. OAB Patient is responding well to Myrbetriq 25 mg She will return in 1 year for OAB questionnaire and PVR  Return in about 1 year (around 09/04/2020) for PVR and OAB questionnaire.  These notes generated with voice recognition software. I apologize for typographical errors.  Zara Council, PA-C  [facility] [address]  [place], [place] [address] [phone]

## 2019-09-05 ENCOUNTER — Ambulatory Visit (INDEPENDENT_AMBULATORY_CARE_PROVIDER_SITE_OTHER): Payer: Medicare Other | Admitting: Urology

## 2019-09-05 ENCOUNTER — Encounter: Payer: Self-pay | Admitting: Urology

## 2019-09-05 ENCOUNTER — Other Ambulatory Visit: Payer: Self-pay

## 2019-09-05 ENCOUNTER — Other Ambulatory Visit
Admission: RE | Admit: 2019-09-05 | Discharge: 2019-09-05 | Disposition: A | Discharge status: Home / Self Care | Payer: Medicare Other | Attending: Urology | Admitting: Urology

## 2019-09-05 VITALS — BP 151/79 | HR 85 | Ht 68.0 in | Wt 156.0 lb

## 2019-09-05 DIAGNOSIS — N952 Postmenopausal atrophic vaginitis: Secondary | ICD-10-CM | POA: Diagnosis present

## 2019-09-05 DIAGNOSIS — N3281 Overactive bladder: Secondary | ICD-10-CM

## 2019-09-05 DIAGNOSIS — R300 Dysuria: Secondary | ICD-10-CM

## 2019-09-05 LAB — URINALYSIS, COMPLETE (UACMP) WITH MICROSCOPIC
Bilirubin Urine: NEGATIVE
Bilirubin Urine: NEGATIVE
Glucose, UA: NEGATIVE mg/dL
Glucose, UA: NEGATIVE mg/dL
Ketones, ur: NEGATIVE mg/dL
Ketones, ur: NEGATIVE mg/dL
Leukocytes,Ua: NEGATIVE
Nitrite: NEGATIVE
Nitrite: NEGATIVE
Protein, ur: NEGATIVE mg/dL
Protein, ur: NEGATIVE mg/dL
Specific Gravity, Urine: 1.015 (ref 1.005–1.030)
Specific Gravity, Urine: 1.02 (ref 1.005–1.030)
pH: 5.5 (ref 5.0–8.0)
pH: 5.5 (ref 5.0–8.0)

## 2019-09-05 LAB — BLADDER SCAN AMB NON-IMAGING: Scan Result: 0

## 2019-09-05 MED ORDER — MIRABEGRON ER 25 MG PO TB24: 25.0000 mg | ORAL_TABLET | Freq: Every day | ORAL | 3 refills | Status: DC

## 2019-09-05 NOTE — Addendum Note (Signed)
Addended by: Kyra Manges on: 09/05/2019 01:02 PM   Modules accepted: Orders

## 2019-09-06 LAB — URINE CULTURE

## 2019-09-07 ENCOUNTER — Telehealth: Payer: Self-pay | Admitting: Family Medicine

## 2019-09-07 NOTE — Telephone Encounter (Signed)
-----   Message from Nori Riis, PA-C sent at 09/07/2019  9:47 AM EST ----- Please let Mrs. Lorch know that her urine culture grew out multiple species which may be the result of a contaminated specimen.  It would be best to obtain a CATH UA for UA and culture.

## 2019-09-07 NOTE — Telephone Encounter (Signed)
LMOM for patient to return call.

## 2019-09-09 NOTE — Telephone Encounter (Signed)
Pt is coming for appt on Monday in [place] for cath UA.

## 2019-09-11 NOTE — Progress Notes (Signed)
In and Out Catheterization  Patient is present today for a I & O catheterization due to dysuria. Patient was cleaned and prepped in a sterile fashion with betadine and Lidocaine 2% jelly was instilled into the urethra.  A 14FR cath was inserted no complications were noted , 73ml of urine return was noted, urine was yellow clear in color. A clean urine sample was collected for UA, UCX. Bladder was drained  And catheter was removed with out difficulty.    Performed by: Elberta Leatherwood, [place]

## 2019-09-12 ENCOUNTER — Ambulatory Visit (INDEPENDENT_AMBULATORY_CARE_PROVIDER_SITE_OTHER): Payer: Medicare Other | Admitting: Urology

## 2019-09-12 ENCOUNTER — Other Ambulatory Visit
Admission: RE | Admit: 2019-09-12 | Discharge: 2019-09-12 | Disposition: A | Discharge status: Home / Self Care | Payer: Medicare Other | Source: Ambulatory Visit | Attending: Urology | Admitting: Urology

## 2019-09-12 ENCOUNTER — Other Ambulatory Visit: Payer: Self-pay

## 2019-09-12 ENCOUNTER — Other Ambulatory Visit: Payer: Self-pay | Admitting: Family Medicine

## 2019-09-12 DIAGNOSIS — R300 Dysuria: Secondary | ICD-10-CM

## 2019-09-12 DIAGNOSIS — Z789 Other specified health status: Secondary | ICD-10-CM | POA: Diagnosis present

## 2019-09-12 LAB — URINALYSIS, COMPLETE (UACMP) WITH MICROSCOPIC
Bilirubin Urine: NEGATIVE
Glucose, UA: NEGATIVE mg/dL
Ketones, ur: NEGATIVE mg/dL
Leukocytes,Ua: NEGATIVE
Nitrite: NEGATIVE
Protein, ur: NEGATIVE mg/dL
Specific Gravity, Urine: 1.02 (ref 1.005–1.030)
pH: 6 (ref 5.0–8.0)

## 2019-09-20 ENCOUNTER — Ambulatory Visit: Payer: Medicare Other | Admitting: Urology

## 2019-09-22 ENCOUNTER — Telehealth: Payer: Self-pay | Admitting: Urology

## 2019-09-22 NOTE — Telephone Encounter (Signed)
Spoke to patient and informed her that if she is still having UTI symptoms she should come in for another urine sample. Patient scheduled appointment.

## 2019-09-22 NOTE — Telephone Encounter (Signed)
Pt states she is calling to get results from her cath UA done on 12/21 and states she is still having problems.  Please advise pt at [phone] or cell [phone].

## 2019-09-26 NOTE — Progress Notes (Signed)
09/27/2019 3:08 PM   Sarah [name] 09-30-1931 [id number]  Referring provider: Hortencia Pilar, [place],  [place] [address]  Chief Complaint  Patient presents with  . Dysuria    HPI: Sarah [name] is an 86 year old female with OAB, dysuria and vaginal atrophy who presents today for symptoms of dysuria and chills.    She was see by Dr. Erlene Quan on 06/10/2019.  At that appointment, she admitted to baseline overactivity, nocturia x 2-3 and urge incontinence for which she wears pads.  She was found to have vaginal atrophy and placed on vaginal estrogen cream.    At her visit on 09/04/2020, the patient was experiencing urgency x 0-3, frequency x 4-7, not restricting fluids to avoid visits to the restroom, is engaging in toilet mapping, incontinence x 0-3 and nocturia x 0-3.   Her BP is 151/79.   Her PVR is 0 mL.   Myrbetriq 25 mg daily and avoiding fruit is helping to providing relief.   She continued to have dysuria at the start of the stream but then it abates.  Her fist UA grossly is yellow clear, trace blood and trace leukocytes on dip, and on microscopy 6-10 squamous epithelial, 21-50 WBC, 0-5 RBC and rare bacteria.   Her second UA is grossly yellow clear, trace blood, 0-5 squamous epithelial cells, 11-20 WBC, 0-5 RBC and rare bacteria.  Her urine culture grew out multiple species.    She presented on 09/12/2019 for a CATH UA which was positive for 11-20 WBC's.  For unknown reasons, it was not sent for culture as ordered.    Today, she is experiencing frequency, dysuria, chills with urination and nocturia.   Patient denies any gross hematuria, dysuria or suprapubic/flank pain.  Patient denies any fevers, chills, nausea or vomiting.   Her UA is yellow clear, trace blood and leuks on dip and microscopically 11-30 WBC and 0-10 epithelial cells.  KUB 09/27/2019 no stones.   She found that that the Premarin cream "burnt like fire," so she discontinued it after two  weeks.     PMH: Past Medical History:  Diagnosis Date  . Benign essential HTN 09/21/2015  . Cancer ([place])    Skin CA resected from Right eyelid and both legs.  . Seizures ([place])   . Thyroid disease     Surgical History: Past Surgical History:  Procedure Laterality Date  . ABDOMINAL HYSTERECTOMY    . BREAST BIOPSY Bilateral    neg    Home Medications:  Allergies as of 09/27/2019      Reactions   Codeine Other (See Comments)   Makes her disoriented   Hydrocodone Other (See Comments)   Patient states she was seeing things that weren't there and very disoriented.   Oxycodone Other (See Comments)   hallucinations   Peanuts [peanut Oil] Other (See Comments)   Violent Migraines   Penicillins Nausea Only   Tramadol Other (See Comments)   Dizziness.      Medication List       Accurate as of September 27, 2019  3:08 PM. If you have any questions, ask your nurse or doctor.        STOP taking these medications   pregabalin 100 MG capsule Commonly known as: LYRICA Stopped by: Jamecia Lerman, PA-C   Premarin vaginal cream Generic drug: conjugated estrogens Stopped by: Zara Council, PA-C     TAKE these medications   beta carotene w/minerals tablet Take 1 tablet by mouth  daily.   cephALEXin 500 MG capsule Commonly known as: KEFLEX Take 1 capsule (500 mg total) by mouth 2 (two) times daily for 10 days. Started by: Zara Council, PA-C   dicyclomine 10 MG capsule Commonly known as: BENTYL Take 10 mg by mouth 4 (four) times daily -  before meals and at bedtime.   gabapentin 100 MG capsule Commonly known as: NEURONTIN TAKE 1 CAP 3 TIMES DAILY X1 WEEK,2 CAPS 3 TIMES DAILY X 1 WEEK THEN 3 CAPS 3 TIMES DAILY   hydrOXYzine 10 MG tablet Commonly known as: ATARAX/VISTARIL Take 1 tablet by mouth as needed.   lovastatin 20 MG tablet Commonly known as: MEVACOR Take 20 mg by mouth at bedtime.   Lumigan 0.01 % Soln Generic drug: bimatoprost Place 1 drop into the left  eye at bedtime.   Melatonin 1 MG Tabs Take 10 mg by mouth once.   mirabegron ER 25 MG Tb24 tablet Commonly known as: MYRBETRIQ Take 1 tablet (25 mg total) by mouth daily.   montelukast 10 MG tablet Commonly known as: SINGULAIR Take 10 mg by mouth at bedtime.   phenazopyridine 97 MG tablet Commonly known as: PYRIDIUM Take 97 mg by mouth once.   PREVAGEN PO Take by mouth once.   Restasis 0.05 % ophthalmic emulsion Generic drug: cycloSPORINE Apply 1 drop to eye 2 (two) times daily.   sertraline 50 MG tablet Commonly known as: ZOLOFT Take 1 tablet by mouth daily.   simvastatin 10 MG tablet Commonly known as: ZOCOR Take 10 mg by mouth at bedtime.   Synthroid 112 MCG tablet Generic drug: levothyroxine Take 1 tablet by mouth daily.       Allergies:  Allergies  Allergen Reactions  . Codeine Other (See Comments)    Makes her disoriented  . Hydrocodone Other (See Comments)    Patient states she was seeing things that weren't there and very disoriented.  . Oxycodone Other (See Comments)    hallucinations  . Peanuts [Peanut Oil] Other (See Comments)    Violent Migraines  . Penicillins Nausea Only  . Tramadol Other (See Comments)    Dizziness.    Family History: Family History  Problem Relation Age of Onset  . Breast cancer Mother 29  . Breast cancer Maternal Uncle 60  . Breast cancer Maternal Aunt        mat great aunt  . Breast cancer Maternal Aunt 71  . Tracheal cancer Father   . Cancer Sister   . Brain cancer Sister   . Cancer Brother     Social History:  reports that she has never smoked. She has never used smokeless tobacco. She reports that she does not drink alcohol. No history on file for drug.  ROS: UROLOGY Frequent Urination?: Yes Hard to postpone urination?: No Burning/pain with urination?: Yes Get up at night to urinate?: Yes Leakage of urine?: No Urine stream starts and stops?: No Trouble starting stream?: No Do you have to strain to  urinate?: No Blood in urine?: No Urinary tract infection?: No Sexually transmitted disease?: No Injury to kidneys or bladder?: No Painful intercourse?: No Weak stream?: No Currently pregnant?: No Vaginal bleeding?: No Last menstrual period?: n  Gastrointestinal Nausea?: No Vomiting?: No Indigestion/heartburn?: No Diarrhea?: No Constipation?: No  Constitutional Fever: No Night sweats?: No Weight loss?: No Fatigue?: No  Skin Skin rash/lesions?: No Itching?: Yes  Eyes Blurred vision?: No Double vision?: No  Ears/Nose/Throat Sore throat?: No Sinus problems?: Yes  Hematologic/Lymphatic Swollen glands?: No Easy  bruising?: Yes  Cardiovascular Leg swelling?: No Chest pain?: No  Respiratory Cough?: No Shortness of breath?: No  Endocrine Excessive thirst?: No  Musculoskeletal Back pain?: Yes Joint pain?: No  Neurological Headaches?: No Dizziness?: No  Psychologic Depression?: No Anxiety?: No  Physical Exam: BP 138/80   Pulse 78   Ht 5\' 8"  (1.727 m)   Wt 152 lb (68.9 kg)   BMI 23.11 kg/m   Constitutional:  Well nourished. Alert and oriented, No acute distress. HEENT: [place] AT, moist mucus membranes.  Trachea midline, no masses. Cardiovascular: No clubbing, cyanosis, or edema. Respiratory: Normal respiratory effort, no increased work of breathing. Neurologic: Grossly intact, no focal deficits, moving all 4 extremities. Psychiatric: Normal mood and affect.   Laboratory Data: Lab Results  Component Value Date   WBC 5.1 09/14/2015   HGB 13.2 09/14/2015   HCT 38.3 09/14/2015   MCV 88.1 09/14/2015   PLT 177 09/14/2015    Lab Results  Component Value Date   CREATININE 0.80 09/14/2015    No results found for: PSA  No results found for: TESTOSTERONE  No results found for: HGBA1C  Lab Results  Component Value Date   TSH 6.05 (H) 02/15/2013    No results found for: CHOL, HDL, CHOLHDL, VLDL, LDLCALC  Lab Results  Component Value Date    AST 19 11/26/2015   Lab Results  Component Value Date   ALT 15 11/26/2015   No components found for: ALKALINEPHOPHATASE No components found for: BILIRUBINTOTAL  No results found for: ESTRADIOL  Urinalysis Component     Latest Ref Rng & Units 09/27/2019          Specific Gravity, UA     1.005 - 1.030 1.015  pH, UA     5.0 - 7.5 6.0  Color, UA     Yellow Yellow  Appearance Ur     Clear Clear  Leukocytes,UA     Negative Trace (A)  Protein,UA     Negative/Trace Negative  Glucose, UA     Negative Negative  Ketones, UA     Negative Negative  RBC, UA     Negative Trace (A)  Bilirubin, UA     Negative Negative  Urobilinogen, Ur     0.2 - 1.0 mg/dL 0.2  Nitrite, UA     Negative Negative  Microscopic Examination      See below:   Component     Latest Ref Rng & Units 09/27/2019          WBC, UA     0 - 5 /hpf 11-30 (A)  RBC     0 - 2 /hpf 0-2  Epithelial Cells (non renal)     0 - 10 /hpf 0-10  Bacteria, UA     None seen/Few None seen   I have reviewed the labs.  Pertinent Imaging CLINICAL DATA:  Flank pain. Painful urination. History of view current urinary tract infections.  EXAM: ABDOMEN - 1 VIEW  COMPARISON:  CT scan of the abdomen and pelvis dated 01/31/2015  FINDINGS: The bowel gas pattern is normal. No radio-opaque calculi or other significant radiographic abnormality are seen. Multilevel chronic degenerative disc disease in the lumbar spine.  IMPRESSION: Benign-appearing abdomen.   Electronically Signed   By: Lorriane Shire M.D.   On: 09/27/2019 10:59  I have independently reviewed the films and do not note any stones.     Pertinent Imaging: Results for GREYSON, JURIS (MRN [id number]) as of 09/05/2019 09:48  Ref.  Range 09/05/2019 09:24  Scan Result Unknown 0   Assessment & Plan:    1. Vaginal atrophy Could not tolerate the vaginal estrogen cream, so she has discontinued the medication  2. Dysuria  UA is send for  culture - started on Keflex - will adjust if necessary - if negative will get RUS  3. OAB Patient is responding well to Myrbetriq 25 mg She will return in 1 year for OAB questionnaire and PVR  Return for pending urine culture results .  These notes generated with voice recognition software. I apologize for typographical errors.  Zara Council, PA-C  [facility] [address]  [place], [place] [address] [phone]

## 2019-09-27 ENCOUNTER — Other Ambulatory Visit: Payer: Self-pay

## 2019-09-27 ENCOUNTER — Ambulatory Visit (INDEPENDENT_AMBULATORY_CARE_PROVIDER_SITE_OTHER): Payer: Medicare Other | Admitting: Urology

## 2019-09-27 ENCOUNTER — Ambulatory Visit
Admission: RE | Admit: 2019-09-27 | Discharge: 2019-09-27 | Disposition: A | Discharge status: Home / Self Care | Payer: Medicare Other | Source: Ambulatory Visit | Attending: Urology | Admitting: Urology

## 2019-09-27 ENCOUNTER — Ambulatory Visit
Admission: RE | Admit: 2019-09-27 | Discharge: 2019-09-27 | Disposition: A | Discharge status: Home / Self Care | Payer: Medicare Other | Attending: Urology | Admitting: Urology

## 2019-09-27 ENCOUNTER — Encounter: Payer: Self-pay | Admitting: Urology

## 2019-09-27 VITALS — BP 138/80 | HR 78 | Ht 68.0 in | Wt 152.0 lb

## 2019-09-27 DIAGNOSIS — N952 Postmenopausal atrophic vaginitis: Secondary | ICD-10-CM | POA: Diagnosis not present

## 2019-09-27 DIAGNOSIS — Z87440 Personal history of urinary (tract) infections: Secondary | ICD-10-CM | POA: Insufficient documentation

## 2019-09-27 DIAGNOSIS — R300 Dysuria: Secondary | ICD-10-CM

## 2019-09-27 DIAGNOSIS — N3281 Overactive bladder: Secondary | ICD-10-CM | POA: Diagnosis not present

## 2019-09-27 LAB — MICROSCOPIC EXAMINATION: Bacteria, UA: NONE SEEN

## 2019-09-27 LAB — URINALYSIS, COMPLETE
Bilirubin, UA: NEGATIVE
Glucose, UA: NEGATIVE
Ketones, UA: NEGATIVE
Nitrite, UA: NEGATIVE
Protein,UA: NEGATIVE
Specific Gravity, UA: 1.015 (ref 1.005–1.030)
Urobilinogen, Ur: 0.2 mg/dL (ref 0.2–1.0)
pH, UA: 6 (ref 5.0–7.5)

## 2019-09-27 MED ORDER — CEPHALEXIN 500 MG PO CAPS: 500.0000 mg | ORAL_CAPSULE | Freq: Two times a day (BID) | ORAL | 0 refills | Status: AC

## 2019-09-29 LAB — CULTURE, URINE COMPREHENSIVE

## 2019-09-30 ENCOUNTER — Other Ambulatory Visit: Payer: Self-pay | Admitting: Family Medicine

## 2019-09-30 ENCOUNTER — Telehealth: Payer: Self-pay | Admitting: Family Medicine

## 2019-09-30 DIAGNOSIS — R300 Dysuria: Secondary | ICD-10-CM

## 2019-09-30 NOTE — Telephone Encounter (Signed)
Can you please put order in for RUS?

## 2019-09-30 NOTE — Telephone Encounter (Signed)
LMOM for patient to return call.

## 2019-09-30 NOTE — Telephone Encounter (Signed)
-----   Message from Nori Riis, PA-C sent at 09/30/2019  7:55 AM EST ----- Please let Mrs. Jimmy know that her urine culture returned negative for infection and I would like to get a RUS at this time.

## 2019-09-30 NOTE — Telephone Encounter (Signed)
Order placed

## 2019-09-30 NOTE — Telephone Encounter (Signed)
Pt returned call and I transferred her to scheduling for RUS.

## 2019-10-03 ENCOUNTER — Encounter: Payer: Self-pay | Admitting: Student in an Organized Health Care Education/Training Program

## 2019-10-04 ENCOUNTER — Ambulatory Visit
Payer: Medicare Other | Attending: Student in an Organized Health Care Education/Training Program | Admitting: Student in an Organized Health Care Education/Training Program

## 2019-10-04 ENCOUNTER — Encounter: Payer: Self-pay | Admitting: Student in an Organized Health Care Education/Training Program

## 2019-10-04 ENCOUNTER — Other Ambulatory Visit: Payer: Self-pay

## 2019-10-04 ENCOUNTER — Telehealth: Payer: Self-pay

## 2019-10-04 DIAGNOSIS — M5416 Radiculopathy, lumbar region: Secondary | ICD-10-CM

## 2019-10-04 DIAGNOSIS — M5116 Intervertebral disc disorders with radiculopathy, lumbar region: Secondary | ICD-10-CM | POA: Diagnosis not present

## 2019-10-04 DIAGNOSIS — M47818 Spondylosis without myelopathy or radiculopathy, sacral and sacrococcygeal region: Secondary | ICD-10-CM

## 2019-10-04 DIAGNOSIS — G894 Chronic pain syndrome: Secondary | ICD-10-CM | POA: Diagnosis not present

## 2019-10-04 DIAGNOSIS — M5136 Other intervertebral disc degeneration, lumbar region: Secondary | ICD-10-CM

## 2019-10-04 DIAGNOSIS — M533 Sacrococcygeal disorders, not elsewhere classified: Secondary | ICD-10-CM

## 2019-10-04 DIAGNOSIS — M461 Sacroiliitis, not elsewhere classified: Secondary | ICD-10-CM | POA: Diagnosis not present

## 2019-10-04 DIAGNOSIS — G8929 Other chronic pain: Secondary | ICD-10-CM

## 2019-10-04 NOTE — Progress Notes (Signed)
Patient: Sarah [name]  Service Category: E/M  Provider: Gillis Santa, MD  DOB: 06/26/1932  DOS: 10/04/2019  Location: Office  MRN: [id number]  Setting: Ambulatory outpatient  Referring Provider: Hortencia Pilar, MD  Type: Established Patient  Specialty: Interventional Pain Management  PCP: Hortencia Pilar, MD  Location: Home  Delivery: TeleHealth     Virtual Encounter - Pain Management PROVIDER NOTE: Information contained herein reflects review and annotations entered in association with encounter. Interpretation of such information and data should be left to medically-trained personnel. Information provided to patient can be located elsewhere in the medical record under "Patient Instructions". Document created using STT-dictation technology, any transcriptional errors that may result from process are unintentional.    Contact & Pharmacy Preferred: [phone] Home: [phone] (home) Mobile: [phone] (mobile) E-mail: [email or web address]  [id number] [address], [place] [address] Phone: [phone] Fax: [phone]  [facility] [place], [place] [address] [place] [address] Phone: [phone] Fax: [phone]   Pre-screening  Sarah [name] offered "in-person" vs "virtual" encounter. She indicated preferring virtual for this encounter.   Reason COVID-19*  Social distancing based on CDC and AMA recommendations.   I contacted Sarah [name] on 10/04/2019 via telephone.      I clearly identified myself as Gillis Santa, MD. I verified that I was speaking with the correct person using two identifiers (Name: Sarah [name], and date of birth: [birth year]).  This visit was completed via telephone due to the restrictions of the COVID-19 pandemic. All issues as above were discussed and addressed but no physical exam was performed. If it was felt that the patient should  be evaluated in the office, they were directed there. The patient verbally consented to this visit. Patient was unable to complete an audio/visual visit due to Technical difficulties and/or Lack of internet. Due to the catastrophic nature of the COVID-19 pandemic, this visit was done through audio contact only.  Location of the patient: home address (see Epic for details)  Location of the provider: office  Consent I sought verbal advanced consent from Sarah [name] for virtual visit interactions. I informed Sarah [name] of possible security and privacy concerns, risks, and limitations associated with providing "not-in-person" medical evaluation and management services. I also informed Sarah [name] of the availability of "in-person" appointments. Finally, I informed her that there would be a charge for the virtual visit and that she could be  personally, fully or partially, financially responsible for it. Sarah [name] expressed understanding and agreed to proceed.   Historic Elements   Sarah [name] is a 86 y.o. year old, female patient evaluated today after her last encounter by our practice on 08/23/2019. Sarah [name]  has a past medical history of Benign essential HTN (09/21/2015), Cancer ([place]), Seizures ([place]), and Thyroid disease. She also  has a past surgical history that includes Abdominal hysterectomy and Breast biopsy (Bilateral). Sarah [name] has a current medication list which includes the following prescription(s): apoaequorin, beta carotene w/minerals, cephalexin, dicyclomine, gabapentin, hydroxyzine, lovastatin, lumigan, melatonin, mirabegron er, montelukast, phenazopyridine, restasis, sertraline, simvastatin, and synthroid. She  reports that she has never smoked. She has never used smokeless tobacco. She reports that she does not drink alcohol. No history on file for drug. Sarah [name] is allergic to codeine; hydrocodone; oxycodone; peanuts [peanut oil]; penicillins; and tramadol.    HPI  Today, she is being contacted for medication  management.  At patient's last visit with me, she was started on belbuca 150 mcg film twice daily.  She tried this medication for 3 days.  Although it was helping her out with her low back pain, patient states that she was having cognitive side effects and did not feel like herself.  She states that this was distressing and she stopped the medication.  She states that her low back and leg pain are better.  She did see neurology at [facility] clinic and they are making adjustments to her Lyrica.  Given that the patient is doing well at this time, do not recommend any injection therapies however will place order for as needed lumbar epidural steroid injection if patient has flareup of her radicular pain.  She has responded to epidural injections in the past and we could consider repeating in the future.  Laboratory Chemistry Profile (12 mo)  Renal: No results found for requested labs within last 8760 hours.  Lab Results  Component Value Date   GFRAA >60 09/14/2015   GFRNONAA >60 09/14/2015   Hepatic: No results found for requested labs within last 8760 hours. Lab Results  Component Value Date   AST 19 11/26/2015   ALT 15 11/26/2015   Other: No results found for requested labs within last 8760 hours. Note: Above Lab results reviewed.  Imaging  DG Abd 1 View CLINICAL DATA:  Flank pain. Painful urination. History of view current urinary tract infections.  EXAM: ABDOMEN - 1 VIEW  COMPARISON:  CT scan of the abdomen and pelvis dated 01/31/2015  FINDINGS: The bowel gas pattern is normal. No radio-opaque calculi or other significant radiographic abnormality are seen. Multilevel chronic degenerative disc disease in the lumbar spine.  IMPRESSION: Benign-appearing abdomen.  Electronically Signed   By: Lorriane Shire M.D.   On: 09/27/2019 10:59   Assessment  The primary encounter diagnosis was Chronic pain syndrome. Diagnoses of  Chronic SI joint pain, SI joint arthritis, Lumbar radiculopathy, and Lumbar degenerative disc disease were also pertinent to this visit.  Plan of Care  I am having Sidney Ace maintain her Lumigan, Restasis, simvastatin, sertraline, Synthroid, hydrOXYzine, beta carotene w/minerals, montelukast, dicyclomine, lovastatin, Apoaequorin (PREVAGEN PO), phenazopyridine, Melatonin, mirabegron ER, gabapentin, and cephALEXin.  Orders:  Orders Placed This Encounter  Procedures  . LESI (PRN)    Standing Status:   Standing    Number of Occurrences:   9    Standing Expiration Date:   04/02/2021    Scheduling Instructions:     Purpose: Palliative     Indication: Lower extremity pain/Sciatica unspecified side (M54.30).     Side: Midline     Level: TBD     Sedation: Patient's choice.     TIMEFRAME: PRN procedure. (Sarah [name] will call when needed.)    Order Specific Question:   Where will this procedure be performed?    Answer:   [facility]   Follow-up plan:   Return if symptoms worsen or fail to improve.     Finds benefit with bilateral sacroiliac joint injection, previous one 03/14/2019 and then second 1 05/25/2019.  Also finds lumbar epidural steroid injections for when she has sciatic flare, consider repeating.  Previous one performed on 10/13/2018 at [facility].      Recent Visits Date Type Provider Dept  08/23/19 Office Visit Gillis Santa, MD [facility]  07/27/19 Procedure visit Gillis Santa, MD [facility]  Showing recent visits within past 90 days and meeting all other requirements  Today's Visits Date Type Provider Dept  10/04/19 Office Visit Gillis Santa, MD [facility]  Showing today's visits and meeting all other requirements   Future Appointments No visits were found meeting these conditions.  Showing future appointments within next 90 days and meeting all other requirements   I discussed the assessment and treatment plan with the patient.  The patient was provided an opportunity to ask questions and all were answered. The patient agreed with the plan and demonstrated an understanding of the instructions.  Patient advised to call back or seek an in-person evaluation if the symptoms or condition worsens.  Total duration of non-face-to-face encounter: 20 minutes.  Note by: Gillis Santa, MD Date: 10/04/2019; Time: 1:48 PM

## 2019-10-04 NOTE — Telephone Encounter (Signed)
Called pt today to inform her that her insurance co denied Myrbetriq. She will need to try the alternative first; oxybutynin, tolterodine or trospium. I confirmed with the pt that she has never taken any of these meds. Please advise

## 2019-10-05 MED ORDER — TROSPIUM CHLORIDE ER 60 MG PO CP24: 60.0000 mg | ORAL_CAPSULE | ORAL | 11 refills | Status: AC

## 2019-10-05 NOTE — Telephone Encounter (Signed)
Please let Sarah [name] know what her insurance requires.  I would send in a script for the trospium XL 60 mg daily.  Do advise her that it may cause dry mouth, dry eyes, constipation and possibly memory issues?

## 2019-10-05 NOTE — Telephone Encounter (Signed)
Patient notified and RX sent to pharmacy.

## 2019-10-06 ENCOUNTER — Other Ambulatory Visit: Payer: Self-pay

## 2019-10-06 ENCOUNTER — Ambulatory Visit
Admission: RE | Admit: 2019-10-06 | Discharge: 2019-10-06 | Disposition: A | Discharge status: Home / Self Care | Payer: Medicare Other | Source: Ambulatory Visit | Attending: Urology | Admitting: Urology

## 2019-10-06 DIAGNOSIS — R300 Dysuria: Secondary | ICD-10-CM | POA: Diagnosis present

## 2019-10-06 DIAGNOSIS — N2889 Other specified disorders of kidney and ureter: Secondary | ICD-10-CM | POA: Insufficient documentation

## 2019-10-06 NOTE — Telephone Encounter (Signed)
Would you please have Sarah [name] schedule a follow up appointment with me in three months to see how the trospium is working?

## 2019-10-07 ENCOUNTER — Telehealth: Payer: Self-pay | Admitting: Family Medicine

## 2019-10-07 NOTE — Telephone Encounter (Addendum)
Patient notified and appointment made

## 2019-10-07 NOTE — Telephone Encounter (Signed)
-----   Message from Nori Riis, PA-C sent at 10/06/2019  5:01 PM EST ----- Please let Mrs. Kiger know that her renal ultrasound does not show any stones or other findings that would explain her symptoms.

## 2019-10-07 NOTE — Telephone Encounter (Signed)
Unable to leave message, no voicemail.

## 2019-10-07 NOTE — Telephone Encounter (Signed)
Patient notified and voiced understanding.

## 2019-10-17 ENCOUNTER — Ambulatory Visit: Payer: Medicare Other | Admitting: Urology

## 2020-01-03 ENCOUNTER — Other Ambulatory Visit: Payer: Self-pay

## 2020-01-03 ENCOUNTER — Encounter: Payer: Self-pay | Admitting: Urology

## 2020-01-03 ENCOUNTER — Ambulatory Visit (INDEPENDENT_AMBULATORY_CARE_PROVIDER_SITE_OTHER): Payer: Medicare Other | Admitting: Urology

## 2020-01-03 VITALS — BP 127/78 | HR 67 | Ht 68.0 in | Wt 155.7 lb

## 2020-01-03 DIAGNOSIS — N952 Postmenopausal atrophic vaginitis: Secondary | ICD-10-CM | POA: Diagnosis not present

## 2020-01-03 DIAGNOSIS — N3281 Overactive bladder: Secondary | ICD-10-CM

## 2020-01-03 LAB — BLADDER SCAN AMB NON-IMAGING: Scan Result: 11

## 2020-01-03 NOTE — Progress Notes (Signed)
01/03/20 10:00 AM  [name] 04/12/1932 [id number]  Referring provider: Hortencia Pilar, [facility] [place],  [place] [address]  Chief Complaint  Patient presents with  . Atrophic bladder    HPI: Mrs. Costilow is an 86 year old female with OAB, dysuria and vaginal atrophy who returns today for management of medication.    She was see by Dr. Erlene Quan on 06/10/2019.  At that appointment, she admitted to baseline overactivity, nocturia x 2-3 and urge incontinence for which she wears pads.  She was found to have vaginal atrophy and placed on vaginal estrogen cream.    At her visit on 09/04/2020, the patient was experiencing urgency x 0-3, frequency x 4-7, not restricting fluids to avoid visits to the restroom, is engaging in toilet mapping, incontinence x 0-3 and nocturia x 0-3.   Her BP is 151/79.   Her PVR is 0 mL.   Myrbetriq 25 mg daily and avoiding fruit is helping to providing relief.   She continued to have dysuria at the start of the stream but then it abates.  Her fist UA grossly is yellow clear, trace blood and trace leukocytes on dip, and on microscopy 6-10 squamous epithelial, 21-50 WBC, 0-5 RBC and rare bacteria.   Her second UA is grossly yellow clear, trace blood, 0-5 squamous epithelial cells, 11-20 WBC, 0-5 RBC and rare bacteria.  Her urine culture grew out multiple species.    She presented on 09/12/2019 for a CATH UA which was positive for 11-20 WBC's.  For unknown reasons, it was not sent for culture as ordered.   KUB 09/27/2019 negative.  RUS 10/06/2019 Prominent extrarenal pelvises bilaterally, though similar appearance to 2016 CT. Negative for hydronephrosis.  The kidneys are otherwise normal in appearance.  Today, The patient is  experiencing urgency x 0-3 (stable), frequency x 4-7  (stable), not restricting fluids to avoid visits to the restroom, is engaging in toilet mapping, incontinence x 0-3 (stable) and nocturia x 0-3 (stable).   Her BP is  127/78.   Her PVR is 11 mL.  She feels the tropsium XL 60 mg is working for her by decreasing her frequency and incontinence episodes.  She is not having dry mouth, dry eyes or constipation from the medication.  She is also not reporting cognitive issues.    PMH: Past Medical History:  Diagnosis Date  . Benign essential HTN 09/21/2015  . Cancer ([place])    Skin CA resected from Right eyelid and both legs.  . Seizures ([place])   . Thyroid disease     Surgical History: Past Surgical History:  Procedure Laterality Date  . ABDOMINAL HYSTERECTOMY    . BREAST BIOPSY Bilateral    neg    Home Medications:  Current Outpatient Medications on File Prior to Visit  Medication Sig Dispense Refill  . beta carotene w/minerals (OCUVITE) tablet Take 1 tablet by mouth daily.    Marland Kitchen dicyclomine (BENTYL) 10 MG capsule Take 10 mg by mouth 4 (four) times daily -  before meals and at bedtime.    . gabapentin (NEURONTIN) 100 MG capsule TAKE 1 CAP 3 TIMES DAILY X1 WEEK,2 CAPS 3 TIMES DAILY X 1 WEEK THEN 3 CAPS 3 TIMES DAILY    . hydrOXYzine (ATARAX/VISTARIL) 10 MG tablet Take 1 tablet by mouth as needed.    . lovastatin (MEVACOR) 20 MG tablet Take 20 mg by mouth at bedtime.    Marland Kitchen LUMIGAN 0.01 % SOLN Place 1 drop into the left  eye at bedtime.    . Melatonin 1 MG TABS Take 10 mg by mouth once.    . montelukast (SINGULAIR) 10 MG tablet Take 10 mg by mouth at bedtime.    . sertraline (ZOLOFT) 50 MG tablet Take 1 tablet by mouth daily.    . simvastatin (ZOCOR) 10 MG tablet Take 10 mg by mouth at bedtime.    Marland Kitchen SYNTHROID 112 MCG tablet Take 1 tablet by mouth daily.     . Trospium Chloride 60 MG CP24 Take by mouth.     No current facility-administered medications on file prior to visit.    Allergies:  Allergies  Allergen Reactions  . Codeine Other (See Comments)    Makes her disoriented  . Hydrocodone Other (See Comments)    Patient states she was seeing things that weren't there and very disoriented.  .  Oxycodone Other (See Comments)    hallucinations  . Peanuts [Peanut Oil] Other (See Comments)    Violent Migraines  . Penicillins Nausea Only  . Tramadol Other (See Comments)    Dizziness.    Family History: Family History  Problem Relation Age of Onset  . Breast cancer Mother 61  . Breast cancer Maternal Uncle 60  . Breast cancer Maternal Aunt        mat great aunt  . Breast cancer Maternal Aunt 55  . Tracheal cancer Father   . Cancer Sister   . Brain cancer Sister   . Cancer Brother     Social History:  reports that she has never smoked. She has never used smokeless tobacco. She reports that she does not drink alcohol. No history on file for drug.   Physical Exam: BP 127/78   Pulse 67   Ht 5\' 8"  (1.727 m)   Wt 155 lb 11.2 oz (70.6 kg)   BMI 23.67 kg/m   Constitutional:  Well nourished. Alert and oriented, No acute distress. HEENT: [place] AT, mask in place  Trachea midline Cardiovascular: No clubbing, cyanosis, or edema. Respiratory: Normal respiratory effort, no increased work of breathing. Neurologic: Grossly intact, no focal deficits, moving all 4 extremities. Psychiatric: Normal mood and affect.    Laboratory Data: Serum creatinine 0.8 on 03/23/2019 I have reviewed the labs.   Pertinent Imaging: Results for orders placed or performed in visit on 01/03/20  Bladder Scan (Post Void Residual) in office  Result Value Ref Range   Scan Result 11       Assessment & Plan:    1. Urinary Incontinence Continue conservative measures Continue trospium XL 60 mg daily RTC in one year for OAB questionnaire and PVR  2. Vaginal atrophy Patient could not tolerate the vaginal estrogen cream    Return in about 1 year (around 01/02/2021) for PVR and OAB questionnaire.  Zara Council, PA-C  [facility] [address], [place], [place] [address] [phone]  I, Noland Fordyce, am acting as a Education administrator for [employer],  PA-C.  I have reviewed the above documentation for accuracy and completeness, and I agree with the above.    Zara Council, PA-C

## 2020-01-24 ENCOUNTER — Other Ambulatory Visit: Payer: Self-pay | Admitting: Acute Care

## 2020-01-24 DIAGNOSIS — M5030 Other cervical disc degeneration, unspecified cervical region: Secondary | ICD-10-CM

## 2020-01-31 ENCOUNTER — Other Ambulatory Visit: Payer: Self-pay | Admitting: Family Medicine

## 2020-01-31 DIAGNOSIS — Z1231 Encounter for screening mammogram for malignant neoplasm of breast: Secondary | ICD-10-CM

## 2020-02-07 ENCOUNTER — Ambulatory Visit
Admission: RE | Admit: 2020-02-07 | Discharge: 2020-02-07 | Disposition: A | Discharge status: Home / Self Care | Payer: Medicare Other | Source: Ambulatory Visit | Attending: Acute Care | Admitting: Acute Care

## 2020-02-07 ENCOUNTER — Other Ambulatory Visit: Payer: Self-pay

## 2020-02-07 DIAGNOSIS — M5030 Other cervical disc degeneration, unspecified cervical region: Secondary | ICD-10-CM | POA: Diagnosis not present

## 2020-03-01 ENCOUNTER — Encounter: Payer: Self-pay | Admitting: Student in an Organized Health Care Education/Training Program

## 2020-03-01 ENCOUNTER — Other Ambulatory Visit: Payer: Self-pay

## 2020-03-01 ENCOUNTER — Ambulatory Visit
Payer: Medicare Other | Attending: Student in an Organized Health Care Education/Training Program | Admitting: Student in an Organized Health Care Education/Training Program

## 2020-03-01 VITALS — BP 124/86 | HR 97 | Temp 97.3°F | Resp 18 | Ht 68.0 in | Wt 156.0 lb

## 2020-03-01 DIAGNOSIS — M5416 Radiculopathy, lumbar region: Secondary | ICD-10-CM

## 2020-03-01 DIAGNOSIS — M47892 Other spondylosis, cervical region: Secondary | ICD-10-CM | POA: Diagnosis not present

## 2020-03-01 DIAGNOSIS — M47812 Spondylosis without myelopathy or radiculopathy, cervical region: Secondary | ICD-10-CM | POA: Diagnosis present

## 2020-03-01 DIAGNOSIS — G894 Chronic pain syndrome: Secondary | ICD-10-CM | POA: Diagnosis present

## 2020-03-01 DIAGNOSIS — M47816 Spondylosis without myelopathy or radiculopathy, lumbar region: Secondary | ICD-10-CM | POA: Insufficient documentation

## 2020-03-01 NOTE — Patient Instructions (Signed)

## 2020-03-01 NOTE — Progress Notes (Signed)
PROVIDER NOTE: Information contained herein reflects review and annotations entered in association with encounter. Interpretation of such information and data should be left to medically-trained personnel. Information provided to patient can be located elsewhere in the medical record under "Patient Instructions". Document created using STT-dictation technology, any transcriptional errors that may result from process are unintentional.    Patient: Sarah [name]  Service Category: E/M  Provider: Gillis Santa, MD  DOB: May 05, 1932  DOS: 03/01/2020  Specialty: Interventional Pain Management  MRN: [id number]  Setting: Ambulatory outpatient  PCP: Hortencia Pilar, MD  Type: Established Patient    Referring Provider: Hortencia Pilar, MD  Location: Office  Delivery: Face-to-face     HPI  Reason for encounter: Sarah [name], a 86 y.o. year old female, is here today for evaluation and management of her Cervical facet joint syndrome [M47.812]. Sarah [name] primary complain today is Neck Pain Last encounter: Practice (10/04/2019). My last encounter with her was on 10/04/2019. Pertinent problems: Sarah [name] has Lumbar facet arthropathy; Lumbar radiculopathy; Lumbar disc herniation (L2/L3); Lumbar degenerative disc disease; Chronic pain syndrome; Neuropathy; and Thyroid nodule on their pertinent problem list. Pain Assessment: Severity of Chronic pain is reported as a 8 /10. Location: Neck  /into the head. Onset: More than a month ago. Quality: Throbbing. Timing: Intermittent. Modifying factor(s): sitting still. Vitals:  height is '5\' 8"'$  (1.727 m) and weight is 156 lb (70.8 kg). Her temporal temperature is 97.3 F (36.3 C) (abnormal). Her blood pressure is 124/86 and her pulse is 97. Her respiration is 18 and oxygen saturation is 99%.   Patient presents today with neck pain, bilateral shoulder pain as well as posterior dominant headaches.  This has been going on for many years but has gotten worse  over the last 3 to 4 months.  She has been evaluated by Dr. Lacinda Axon with neurosurgery.  She does have significant cervical facet arthropathy and degenerative cervical disc disease throughout her cervical spine most pronounced from C4-C7.  She is referred to physical therapy for her neck.  We discussed cervical facet medial branch nerve block for her cervical facet joint syndrome which is resulting in neck pain as well as headaches.  Risks and benefits of this procedure were reviewed in great detail and patient would like to proceed.  She states that her lumbar spine pain is doing better.  Although she continues to have pain there, she states that it is reduced in intensity and she is able to function without it bothering her as much  ROS  Constitutional: Denies any fever or chills Gastrointestinal: No reported hemesis, hematochezia, vomiting, or acute GI distress Musculoskeletal: Denies any acute onset joint swelling, redness, loss of ROM, or weakness Neurological: No reported episodes of acute onset apraxia, aphasia, dysarthria, agnosia, amnesia, paralysis, loss of coordination, or loss of consciousness  Medication Review  Trospium Chloride, beta carotene w/minerals, bimatoprost, dicyclomine, gabapentin, hydrOXYzine, levothyroxine, lovastatin, melatonin, montelukast, sertraline, and simvastatin  History Review  Allergy: Sarah [name] is allergic to codeine, hydrocodone, oxycodone, peanuts [peanut oil], penicillins, and tramadol. Drug: Sarah [name]  has no history on file for drug use. Alcohol:  reports no history of alcohol use. Tobacco:  reports that she has never smoked. She has never used smokeless tobacco. Social: Sarah [name]  reports that she has never smoked. She has never used smokeless tobacco. She reports that she does not drink alcohol. Medical:  has a past medical history of Benign essential HTN (09/21/2015), Cancer ([place]), Seizures ([place]), and Thyroid disease.  Surgical: Sarah [name]  has a past  surgical history that includes Abdominal hysterectomy and Breast biopsy (Bilateral). Family: family history includes Brain cancer in her sister; Breast cancer in her maternal aunt; Breast cancer (age of onset: 68) in her maternal aunt; Breast cancer (age of onset: 36) in her mother; Breast cancer (age of onset: 44) in her maternal uncle; Cancer in her brother and sister; Tracheal cancer in her father.  Laboratory Chemistry Profile   Renal Lab Results  Component Value Date   BUN 23 (H) 09/14/2015   CREATININE 0.80 09/14/2015   GFRAA >60 09/14/2015   GFRNONAA >60 09/14/2015     Hepatic Lab Results  Component Value Date   AST 19 11/26/2015   ALT 15 11/26/2015   ALBUMIN 4.7 11/26/2015   ALKPHOS 64 11/26/2015     Electrolytes Lab Results  Component Value Date   NA 141 09/14/2015   K 3.5 09/14/2015   CL 107 09/14/2015   CALCIUM 9.7 09/14/2015   MG 2.2 09/14/2015     Bone No results found for: VD25OH, VD125OH2TOT, PO2423NT6, RW4315QM0, 25OHVITD1, 25OHVITD2, 25OHVITD3, TESTOFREE, TESTOSTERONE   Inflammation (CRP: Acute Phase) (ESR: Chronic Phase) No results found for: CRP, ESRSEDRATE, LATICACIDVEN     Note: Above Lab results reviewed.  Recent Imaging Review  MR CERVICAL SPINE WO CONTRAST CLINICAL DATA:  Degeneration of cervical intervertebral disc. Additional history provided by technologist: Patient reports posterior neck pain that radiates into posterior head.  EXAM: MRI CERVICAL SPINE WITHOUT CONTRAST  TECHNIQUE: Multiplanar, multisequence MR imaging of the cervical spine was performed. No intravenous contrast was administered.  COMPARISON:  Report from cervical spine radiographs 01/24/2020 (images currently unavailable).  FINDINGS: Alignment: Minimal grade 1 anterolisthesis at C3-C4, C4-C5, C5-C6, C6-C7, C7-T1 T1-T2 and T2-T3.  Vertebrae: Vertebral body height is maintained. T2/STIR hyperintensity with right T2 articular pillar. There is suggestion of subtle  corresponding T1 hyperintensity at this site and this may reflect a small hemangioma. T2 vertebral body hemangioma. Multilevel degenerative endplate irregularity. Mild fatty degenerative endplate marrow signal at C5-C6 and C6-C7.  Cord: No spinal cord signal abnormality.  Posterior Fossa, vertebral arteries, paraspinal tissues: No abnormality identified within included portions of the posterior fossa. Flow voids preserved within the imaged cervical vertebral arteries. Paraspinal soft tissues within normal limits. 12 mm nodule within the right thyroid lobe (series 7, image 6).  Disc levels:  Moderate C6-C7 and C7-T1 disc degeneration. Mild disc degeneration at the remaining cervical levels.  C2-C3: Mild disc bulge. Facet hypertrophy (predominantly on the right). Minimal effacement of the ventral thecal sac without significant spinal canal stenosis. Mild relative right neural foraminal narrowing.  C3-C4: Mild disc bulge. Uncinate/facet hypertrophy (greater on the right). Minimal effacement of the ventral thecal sac without significant spinal canal stenosis. Moderate/severe right neural foraminal narrowing. Left neural foramen patent.  C4-C5: Facet hypertrophy. No significant disc herniation or stenosis.  C5-C6: Facet hypertrophy. No significant disc herniation or stenosis.  C6-C7: Disc bulge. Bilateral disc osteophyte ridge/uncinate hypertrophy. Facet hypertrophy. The disc bulge effaces the ventral thecal sac and may contact the ventral spinal cord contributing to overall mild spinal canal stenosis. Bilateral neural foraminal narrowing (mild right, moderate left).  C7-T1: No disc herniation. Mild facet/ligamentum flavum hypertrophy. No significant spinal canal or neural foraminal narrowing.  IMPRESSION: Cervical spondylosis as outlined and most notably as follows.  At C6-C7, there is moderate disc degeneration. Disc bulge with bilateral disc osteophyte ridge/uncinate  hypertrophy. Facet hypertrophy. The disc bulge contributes to mild spinal canal stenosis  with possible contact upon the ventral spinal cord. Bilateral neural foraminal narrowing (mild right, moderate left).  At C3-C4, multifactorial moderate/severe right neural foraminal narrowing.  Mild multilevel grade 1 anterolisthesis within the cervical and visualized upper thoracic spine as detailed.  Electronically Signed   By: Kellie Simmering DO   On: 02/07/2020 10:15 Note: Reviewed        Physical Exam  General appearance: Well nourished, well developed, and well hydrated. In no apparent acute distress Mental status: Alert, oriented x 3 (person, place, & time)       Respiratory: No evidence of acute respiratory distress Eyes: PERLA Vitals: BP 124/86   Pulse 97   Temp (!) 97.3 F (36.3 C) (Temporal)   Resp 18   Ht '5\' 8"'$  (1.727 m)   Wt 156 lb (70.8 kg)   SpO2 99%   BMI 23.72 kg/m  BMI: Estimated body mass index is 23.72 kg/m as calculated from the following:   Height as of this encounter: '5\' 8"'$  (1.727 m).   Weight as of this encounter: 156 lb (70.8 kg). Ideal: Ideal body weight: 63.9 kg (140 lb 14 oz) Adjusted ideal body weight: 66.6 kg (146 lb 14.8 oz) Cervical Spine Area Exam  Skin & Axial Inspection: No masses, redness, edema, swelling, or associated skin lesions Alignment: Symmetrical Functional ROM: Pain restricted ROM, bilaterally Stability: No instability detected Muscle Tone/Strength: Functionally intact. No obvious neuro-muscular anomalies detected. Sensory (Neurological): Musculoskeletal pain pattern Palpation: Complains of area being tender to palpation Positive provocative maneuver for for cervical facet disease Upper Extremity (UE) Exam    Side: Right upper extremity  Side: Left upper extremity  Skin & Extremity Inspection: Skin color, temperature, and hair growth are WNL. No peripheral edema or cyanosis. No masses, redness, swelling, asymmetry, or associated skin  lesions. No contractures.  Skin & Extremity Inspection: Skin color, temperature, and hair growth are WNL. No peripheral edema or cyanosis. No masses, redness, swelling, asymmetry, or associated skin lesions. No contractures.  Functional ROM: Unrestricted ROM          Functional ROM: Unrestricted ROM          Muscle Tone/Strength: Functionally intact. No obvious neuro-muscular anomalies detected.  Muscle Tone/Strength: Functionally intact. No obvious neuro-muscular anomalies detected.  Sensory (Neurological): Musculoskeletal pain pattern          Sensory (Neurological): Musculoskeletal pain pattern          Palpation: No palpable anomalies              Palpation: No palpable anomalies              Provocative Test(s):  Phalen's test: deferred Tinel's test: deferred Apley's scratch test (touch opposite shoulder):  Action 1 (Across chest): deferred Action 2 (Overhead): deferred Action 3 (LB reach): deferred   Provocative Test(s):  Phalen's test: deferred Tinel's test: deferred Apley's scratch test (touch opposite shoulder):  Action 1 (Across chest): deferred Action 2 (Overhead): deferred Action 3 (LB reach): deferred    Assessment   Status Diagnosis  Having a Flare-up Persistent Persistent 1. Cervical facet joint syndrome   2. Cervical spondylosis   3. Other osteoarthritis of spine, cervical region   4. Lumbar radiculopathy   5. Lumbar facet arthropathy (L5/S1)   6. Chronic pain syndrome      Updated Problems: Problem  Lumbar Radiculopathy  Lumbar disc herniation (L2/L3)  Lumbar Degenerative Disc Disease  Chronic Pain Syndrome  Neuropathy  Thyroid Nodule   Needs repeat Aug 2020  Lumbar Facet Arthropathy  Spinal Osteoarthritis    Plan of Care  Sarah [name] has a history of greater than 3 months of moderate to severe pain which is resulted in functional impairment.  The patient has tried various conservative therapeutic options such as NSAIDs, Tylenol, muscle  relaxants, physical therapy which was inadequately effective.  Patient's pain is predominantly axial with physical exam findings suggestive of facet arthropathy.Cervical facet medial branch nerve blocks were discussed with the patient.  Risks and benefits were reviewed.  Patient would like to proceed with bilateral C4, C5, C6, C7 medial branch nerve block.  Orders:  Orders Placed This Encounter  Procedures  . CERVICAL FACET (MEDIAL BRANCH NERVE BLOCK)     Standing Status:   Future    Standing Expiration Date:   03/31/2020    Scheduling Instructions:     Side: Bilateral     Level: C3-4, C4-5, C5-6 Facet joints (C4, C5, C6, & C7 Medial Branch Nerves)     Sedation: With Sedation.     Timeframe: As soon as schedule allows    Order Specific Question:   Where will this procedure be performed?    Answer:   [facility]   Follow-up plan:   Return in about 1 week (around 03/08/2020) for C fct block with sedation.     Finds benefit with bilateral sacroiliac joint injection, previous one 03/14/2019 and then second 1 05/25/2019.  Also finds lumbar epidural steroid injections for when she has sciatic flare, consider repeating.  Previous one performed on 10/13/2018 at L2-L3.       Recent Visits No visits were found meeting these conditions. Showing recent visits within past 90 days and meeting all other requirements Today's Visits Date Type Provider Dept  03/01/20 Office Visit Gillis Santa, MD [facility]  Showing today's visits and meeting all other requirements Future Appointments Date Type Provider Dept  03/05/20 Appointment Gillis Santa, MD [facility]  Showing future appointments within next 90 days and meeting all other requirements  I discussed the assessment and treatment plan with the patient. The patient was provided an opportunity to ask questions and all were answered. The patient agreed with the plan and demonstrated an understanding of the  instructions.  Patient advised to call back or seek an in-person evaluation if the symptoms or condition worsens.  Duration of encounter: 30 minutes.  Note by: Gillis Santa, MD Date: 03/01/2020; Time: 2:40 PM

## 2020-03-01 NOTE — Progress Notes (Signed)
Safety precautions to be maintained throughout the outpatient stay will include: orient to surroundings, keep bed in low position, maintain call bell within reach at all times, provide assistance with transfer out of bed and ambulation.  

## 2020-03-05 ENCOUNTER — Encounter: Payer: Self-pay | Admitting: Student in an Organized Health Care Education/Training Program

## 2020-03-05 ENCOUNTER — Ambulatory Visit (HOSPITAL_BASED_OUTPATIENT_CLINIC_OR_DEPARTMENT_OTHER): Payer: Medicare Other | Admitting: Student in an Organized Health Care Education/Training Program

## 2020-03-05 ENCOUNTER — Other Ambulatory Visit: Payer: Self-pay

## 2020-03-05 ENCOUNTER — Ambulatory Visit
Admission: RE | Admit: 2020-03-05 | Discharge: 2020-03-05 | Disposition: A | Discharge status: Home / Self Care | Payer: Medicare Other | Source: Ambulatory Visit | Attending: Student in an Organized Health Care Education/Training Program | Admitting: Student in an Organized Health Care Education/Training Program

## 2020-03-05 VITALS — BP 142/84 | HR 68 | Temp 97.1°F | Resp 12 | Ht 68.0 in | Wt 156.0 lb

## 2020-03-05 DIAGNOSIS — G894 Chronic pain syndrome: Secondary | ICD-10-CM | POA: Diagnosis present

## 2020-03-05 DIAGNOSIS — M47812 Spondylosis without myelopathy or radiculopathy, cervical region: Secondary | ICD-10-CM | POA: Insufficient documentation

## 2020-03-05 MED ORDER — ROPIVACAINE HCL 2 MG/ML IJ SOLN
INTRAMUSCULAR | Status: AC
  Filled 2020-03-05: qty 10

## 2020-03-05 MED ORDER — DEXAMETHASONE SODIUM PHOSPHATE 10 MG/ML IJ SOLN
INTRAMUSCULAR | Status: AC
  Filled 2020-03-05: qty 1

## 2020-03-05 MED ORDER — FENTANYL CITRATE (PF) 100 MCG/2ML IJ SOLN
25.0000 ug | INTRAMUSCULAR | Status: AC | PRN
  Administered 2020-03-05: 50 ug via INTRAVENOUS
  Administered 2020-03-05 (×2): 25 ug via INTRAVENOUS

## 2020-03-05 MED ORDER — DEXAMETHASONE SODIUM PHOSPHATE 10 MG/ML IJ SOLN
10.0000 mg | Freq: Once | INTRAMUSCULAR | Status: AC
  Administered 2020-03-05: 10 mg

## 2020-03-05 MED ORDER — FENTANYL CITRATE (PF) 100 MCG/2ML IJ SOLN
INTRAMUSCULAR | Status: AC
  Filled 2020-03-05: qty 2

## 2020-03-05 MED ORDER — ROPIVACAINE HCL 2 MG/ML IJ SOLN
9.0000 mL | Freq: Once | INTRAMUSCULAR | Status: AC
  Administered 2020-03-05: 9 mL via PERINEURAL

## 2020-03-05 MED ORDER — LIDOCAINE HCL 2 % IJ SOLN
20.0000 mL | Freq: Once | INTRAMUSCULAR | Status: AC
  Administered 2020-03-05: 400 mg

## 2020-03-05 MED ORDER — LIDOCAINE HCL 2 % IJ SOLN
INTRAMUSCULAR | Status: AC
  Filled 2020-03-05: qty 20

## 2020-03-05 NOTE — Patient Instructions (Signed)

## 2020-03-05 NOTE — Progress Notes (Signed)
PROVIDER NOTE: Information contained herein reflects review and annotations entered in association with encounter. Interpretation of such information and data should be left to medically-trained personnel. Information provided to patient can be located elsewhere in the medical record under "Patient Instructions". Document created using STT-dictation technology, any transcriptional errors that may result from process are unintentional.    Patient: Sarah [name]  Service Category: Procedure  Provider: Gillis Santa, MD  DOB: 1932/01/12  DOS: 03/05/2020  Location: [facility]  MRN: [id number]  Setting: Ambulatory - outpatient  Referring Provider: Hortencia Pilar, MD  Type: Established Patient  Specialty: Interventional Pain Management  PCP: Hortencia Pilar, MD   Primary Reason for Visit: Interventional Pain Management Treatment. CC: Neck Pain (bilateral )  Procedure:          Anesthesia, Analgesia, Anxiolysis:  Type: Cervical Facet Medial Branch Block(s)  #1  Primary Purpose: Diagnostic Region: Posterolateral cervical spine Level:  C4, C5, C6, & C7 Medial Branch Level(s). Injecting these levels blocks the  C4-5, C5-6, and C6-7 cervical facet joints. Laterality: Bilateral  Type: Moderate (Conscious) Sedation combined with Local Anesthesia Indication(s): Analgesia and Anxiety Route: Intravenous (IV) IV Access: Secured Sedation: Meaningful verbal contact was maintained at all times during the procedure  Local Anesthetic: Lidocaine 1-2%  Position: Prone with head of the table raised to facilitate breathing.   Indications: 1. Cervical facet joint syndrome   2. Cervical spondylosis   3. Chronic pain syndrome    Pain Score: Pre-procedure: 5 /10 Post-procedure: 0-No pain/10   Pre-op Assessment:  Sarah [name] is a 86 y.o. (year old), female patient, seen today for interventional treatment. She  has a past surgical history that includes Abdominal hysterectomy and Breast  biopsy (Bilateral). Sarah [name] has a current medication list which includes the following prescription(s): beta carotene w/minerals, dicyclomine, gabapentin, hydroxyzine, lovastatin, lumigan, melatonin, montelukast, sertraline, simvastatin, synthroid, and trospium chloride. Her primarily concern today is the Neck Pain (bilateral )  Initial Vital Signs:  Pulse/HCG Rate: 69ECG Heart Rate: 69 Temp: (!) 97.1 F (36.2 C) Resp: 16 BP: (!) 146/91 SpO2: 100 %  BMI: Estimated body mass index is 23.72 kg/m as calculated from the following:   Height as of this encounter: 5\' 8"  (1.727 m).   Weight as of this encounter: 156 lb (70.8 kg).  Risk Assessment: Allergies: Reviewed. She is allergic to codeine, hydrocodone, oxycodone, peanuts [peanut oil], penicillins, and tramadol.  Allergy Precautions: None required Coagulopathies: Reviewed. None identified.  Blood-thinner therapy: None at this time Active Infection(s): Reviewed. None identified. Sarah [name] is afebrile  Site Confirmation: Sarah [name] was asked to confirm the procedure and laterality before marking the site Procedure checklist: Completed Consent: Before the procedure and under the influence of no sedative(s), amnesic(s), or anxiolytics, the patient was informed of the treatment options, risks and possible complications. To fulfill our ethical and legal obligations, as recommended by the American Medical Association's Code of Ethics, I have informed the patient of my clinical impression; the nature and purpose of the treatment or procedure; the risks, benefits, and possible complications of the intervention; the alternatives, including doing nothing; the risk(s) and benefit(s) of the alternative treatment(s) or procedure(s); and the risk(s) and benefit(s) of doing nothing. The patient was provided information about the general risks and possible complications associated with the procedure. These may include, but are not limited to: failure to  achieve desired goals, infection, bleeding, organ or nerve damage, allergic reactions, paralysis, and death. In addition, the patient was informed of  those risks and complications associated to Spine-related procedures, such as failure to decrease pain; infection (i.e.: Meningitis, epidural or intraspinal abscess); bleeding (i.e.: epidural hematoma, subarachnoid hemorrhage, or any other type of intraspinal or peri-dural bleeding); organ or nerve damage (i.e.: Any type of peripheral nerve, nerve root, or spinal cord injury) with subsequent damage to sensory, motor, and/or autonomic systems, resulting in permanent pain, numbness, and/or weakness of one or several areas of the body; allergic reactions; (i.e.: anaphylactic reaction); and/or death. Furthermore, the patient was informed of those risks and complications associated with the medications. These include, but are not limited to: allergic reactions (i.e.: anaphylactic or anaphylactoid reaction(s)); adrenal axis suppression; blood sugar elevation that in diabetics may result in ketoacidosis or comma; water retention that in patients with history of congestive heart failure may result in shortness of breath, pulmonary edema, and decompensation with resultant heart failure; weight gain; swelling or edema; medication-induced neural toxicity; particulate matter embolism and blood vessel occlusion with resultant organ, and/or nervous system infarction; and/or aseptic necrosis of one or more joints. Finally, the patient was informed that Medicine is not an exact science; therefore, there is also the possibility of unforeseen or unpredictable risks and/or possible complications that may result in a catastrophic outcome. The patient indicated having understood very clearly. We have given the patient no guarantees and we have made no promises. Enough time was given to the patient to ask questions, all of which were answered to the patient's satisfaction. Sarah [name] has  indicated that she wanted to continue with the procedure. Attestation: I, the ordering provider, attest that I have discussed with the patient the benefits, risks, side-effects, alternatives, likelihood of achieving goals, and potential problems during recovery for the procedure that I have provided informed consent. Date  Time: 03/05/2020  8:26 AM  Pre-Procedure Preparation:  Monitoring: As per clinic protocol. Respiration, ETCO2, SpO2, BP, heart rate and rhythm monitor placed and checked for adequate function Safety Precautions: Patient was assessed for positional comfort and pressure points before starting the procedure. Time-out: I initiated and conducted the "Time-out" before starting the procedure, as per protocol. The patient was asked to participate by confirming the accuracy of the "Time Out" information. Verification of the correct person, site, and procedure were performed and confirmed by me, the nursing staff, and the patient. "Time-out" conducted as per Joint Commission's Universal Protocol (UP.01.01.01). Time: 0910  Description of Procedure:          Laterality: Bilateral. The procedure was performed in identical fashion on both sides. Level: C4, C5, C6, & C7 Medial Branch Level(s). Area Prepped: Posterior Cervico-thoracic Region DuraPrep (Iodine Povacrylex [0.7% available iodine] and Isopropyl Alcohol, 74% w/w) Safety Precautions: Aspiration looking for blood return was conducted prior to all injections. At no point did we inject any substances, as a needle was being advanced. Before injecting, the patient was told to immediately notify me if she was experiencing any new onset of "ringing in the ears, or metallic taste in the mouth". No attempts were made at seeking any paresthesias. Safe injection practices and needle disposal techniques used. Medications properly checked for expiration dates. SDV (single dose vial) medications used. After the completion of the procedure, all  disposable equipment used was discarded in the proper designated medical waste containers. Local Anesthesia: Protocol guidelines were followed. The patient was positioned over the fluoroscopy table. The area was prepped in the usual manner. The time-out was completed. The target area was identified using fluoroscopy. A 12-in long, straight, sterile hemostat  was used with fluoroscopic guidance to locate the targets for each level blocked. Once located, the skin was marked with an approved surgical skin marker. Once all sites were marked, the skin (epidermis, dermis, and hypodermis), as well as deeper tissues (fat, connective tissue and muscle) were infiltrated with a small amount of a short-acting local anesthetic, loaded on a 10cc syringe with a 25G, 1.5-in  Needle. An appropriate amount of time was allowed for local anesthetics to take effect before proceeding to the next step. Local Anesthetic: Lidocaine 2.0% The unused portion of the local anesthetic was discarded in the proper designated containers. Technical explanation of process:   C4 Medial Branch Nerve Block (MBB): The target area for the C4 dorsal medial articular branch is the lateral concave waist of the articular pillar of C4. Under fluoroscopic guidance, a Quincke needle was inserted until contact was made with os over the postero-lateral aspect of the articular pillar of C4 (target area). After negative aspiration for blood, 1 mL of the nerve block solution was injected without difficulty or complication. The needle was removed intact. C5 Medial Branch Nerve Block (MBB): The target area for the C5 dorsal medial articular branch is the lateral concave waist of the articular pillar of C5. Under fluoroscopic guidance, a Quincke needle was inserted until contact was made with os over the postero-lateral aspect of the articular pillar of C5 (target area). After negative aspiration for blood, 1 mL of the nerve block solution was injected without  difficulty or complication. The needle was removed intact. C6 Medial Branch Nerve Block (MBB): The target area for the C6 dorsal medial articular branch is the lateral concave waist of the articular pillar of C6. Under fluoroscopic guidance, a Quincke needle was inserted until contact was made with os over the postero-lateral aspect of the articular pillar of C6 (target area). After negative aspiration for blood, 1 mL of the nerve block solution was injected without difficulty or complication. The needle was removed intact. C7 Medial Branch Nerve Block (MBB): The target for the C7 dorsal medial articular branch lies on the superior-medial tip of the C7 transverse process. Under fluoroscopic guidance, a Quincke needle was inserted until contact was made with os over the postero-lateral aspect of the articular pillar of C7 (target area). After negative aspiration for blood,1 mL of the nerve block solution was injected without difficulty or complication. The needle was removed intact. Procedural Needles: 25-gauge, 3.5-inch, Quincke needles used for all levels. Nerve block solution:  The unused portion of the solution was discarded in the proper designated containers.  8 cc solution made of 6 cc of 0.2% ropivacaine, 2 cc of Decadron 10 mg/cc.  1 cc injected at each level above bilaterally.  Total steroid dose: 20 mg of Decadron  Once the entire procedure was completed, the treated area was cleaned, making sure to leave some of the prepping solution back to take advantage of its long term bactericidal properties.  Anatomy Reference Guide:       Vitals:   03/05/20 0935 03/05/20 0945 03/05/20 0955 03/05/20 1006  BP: 131/72 (!) 147/87 (!) 140/97 (!) 142/84  Pulse: 68     Resp: 13 14 14 12   Temp:  (!) 97.1 F (36.2 C)  (!) 97.1 F (36.2 C)  TempSrc:      SpO2: 97% 100% 100% 100%  Weight:      Height:        Start Time: 0910 hrs. End Time: 0935 hrs.  Imaging Guidance (Spinal):  Type of  Imaging Technique: Fluoroscopy Guidance (Spinal) Indication(s): Assistance in needle guidance and placement for procedures requiring needle placement in or near specific anatomical locations not easily accessible without such assistance. Exposure Time: Please see nurses notes. Contrast: None used. Fluoroscopic Guidance: I was personally present during the use of fluoroscopy. "Tunnel Vision Technique" used to obtain the best possible view of the target area. Parallax error corrected before commencing the procedure. "Direction-depth-direction" technique used to introduce the needle under continuous pulsed fluoroscopy. Once target was reached, antero-posterior, oblique, and lateral fluoroscopic projection used confirm needle placement in all planes. Images permanently stored in EMR. Interpretation: No contrast injected. I personally interpreted the imaging intraoperatively. Adequate needle placement confirmed in multiple planes. Permanent images saved into the patient's record.  Antibiotic Prophylaxis:   Anti-infectives (From admission, onward)   None     Indication(s): None identified  Post-operative Assessment:  Post-procedure Vital Signs:  Pulse/HCG Rate: 6868 Temp: (!) 97.1 F (36.2 C) Resp: 12 BP: (!) 142/84 SpO2: 100 %  EBL: None  Complications: No immediate post-treatment complications observed by team, or reported by patient.  Note: The patient tolerated the entire procedure well. A repeat set of vitals were taken after the procedure and the patient was kept under observation following institutional policy, for this type of procedure. Post-procedural neurological assessment was performed, showing return to baseline, prior to discharge. The patient was provided with post-procedure discharge instructions, including a section on how to identify potential problems. Should any problems arise concerning this procedure, the patient was given instructions to immediately contact us, at any  time, without hesitation. In any case, we plan to contact the patient by telephone for a follow-up status report regarding this interventional procedure.  Comments:  No additional relevant information.  5 out of 5 strength bilateral upper extremity: Shoulder abduction, elbow flexion, elbow extension, thumb extension.   Plan of Care  Orders:  Orders Placed This Encounter  Procedures  . DG PAIN CLIN[facility] C-ARM 1-60 MIN NO REPORT    Intraoperative interpretation by procedural physician at [place].    Standing Status:   Standing    Number of Occurrences:   1    Order Specific Question:   Reason for exam:    Answer:   Assistance in needle guidance and placement for procedures requiring needle placement in or near specific anatomical locations not easily accessible without such assistance.   Medications ordered for procedure: Meds ordered this encounter  Medications  . lidocaine (XYLOCAINE) 2 % (with pres) injection 400 mg  . fentaNYL (SUBLIMAZE) injection 25-50 mcg    Make sure Narcan is available in the pyxis when using this medication. In the event of respiratory depression (RR< 8/min): Titrate NARCAN (naloxone) in increments of 0.1 to 0.2 mg IV at 2-3 minute intervals, until desired degree of reversal.  . ropivacaine (PF) 2 mg/mL (0.2%) (NAROPIN) injection 9 mL  . dexamethasone (DECADRON) injection 10 mg  . dexamethasone (DECADRON) injection 10 mg   Medications administered: We administered lidocaine, fentaNYL, ropivacaine (PF) 2 mg/mL (0.2%), dexamethasone, and dexamethasone.  See the medical record for exact dosing, route, and time of administration.  Follow-up plan:   Return in about 4 weeks (around 04/02/2020) for Post Procedure Evaluation, virtual.      Finds benefit with bilateral sacroiliac joint injection, previous one 03/14/2019 and then second 1 05/25/2019.  Also finds lumbar epidural steroid injections for when she has sciatic flare, consider repeating.  Previous  one performed on 10/13/2018 at [facility].  Status post bilateral C4, C5, C6, C7 cervical facet medial branch nerve block on 03/05/2020 for cervical facet arthropathy        Recent Visits Date Type Provider Dept  03/01/20 Office Visit Gillis Santa, MD [facility]  Showing recent visits within past 90 days and meeting all other requirements Today's Visits Date Type Provider Dept  03/05/20 Procedure visit Gillis Santa, MD [facility]  Showing today's visits and meeting all other requirements Future Appointments Date Type Provider Dept  04/05/20 Appointment Gillis Santa, MD [facility]  Showing future appointments within next 90 days and meeting all other requirements  Disposition: Discharge home  Discharge (Date  Time): 03/05/2020; 1008 hrs.   Primary Care Physician: Hortencia Pilar, MD Location: [facility] Note by: Gillis Santa, MD Date: 03/05/2020; Time: 10:28 AM  Disclaimer:  Medicine is not an exact science. The only guarantee in medicine is that nothing is guaranteed. It is important to note that the decision to proceed with this intervention was based on the information collected from the patient. The Data and conclusions were drawn from the patient's questionnaire, the interview, and the physical examination. Because the information was provided in large part by the patient, it cannot be guaranteed that it has not been purposely or unconsciously manipulated. Every effort has been made to obtain as much relevant data as possible for this evaluation. It is important to note that the conclusions that lead to this procedure are derived in large part from the available data. Always take into account that the treatment will also be dependent on availability of resources and existing treatment guidelines, considered by other Pain Management Practitioners as being common knowledge and practice, at the time of the intervention. For Medico-Legal  purposes, it is also important to point out that variation in procedural techniques and pharmacological choices are the acceptable norm. The indications, contraindications, technique, and results of the above procedure should only be interpreted and judged by a Board-Certified Interventional Pain Specialist with extensive familiarity and expertise in the same exact procedure and technique.

## 2020-03-05 NOTE — Progress Notes (Signed)
Safety precautions to be maintained throughout the outpatient stay will include: orient to surroundings, keep bed in low position, maintain call bell within reach at all times, provide assistance with transfer out of bed and ambulation.  

## 2020-03-06 ENCOUNTER — Telehealth: Payer: Self-pay

## 2020-03-06 NOTE — Telephone Encounter (Signed)
Post procedure phone call. Patient states she is doing good.  

## 2020-03-09 ENCOUNTER — Other Ambulatory Visit: Payer: Self-pay

## 2020-03-09 ENCOUNTER — Ambulatory Visit (INDEPENDENT_AMBULATORY_CARE_PROVIDER_SITE_OTHER): Payer: Medicare Other | Admitting: Physician Assistant

## 2020-03-09 VITALS — BP 115/74 | HR 91 | Temp 98.2°F | Wt 156.0 lb

## 2020-03-09 DIAGNOSIS — N3001 Acute cystitis with hematuria: Secondary | ICD-10-CM | POA: Diagnosis not present

## 2020-03-09 MED ORDER — SULFAMETHOXAZOLE-TRIMETHOPRIM 800-160 MG PO TABS: 1.0000 | ORAL_TABLET | Freq: Two times a day (BID) | ORAL | 0 refills | Status: AC

## 2020-03-09 NOTE — Progress Notes (Signed)
03/09/2020 3:40 PM   Sarah [name] Feb 02, 1932 [id number]  CC: Chief Complaint  Patient presents with  . Dysuria    HPI: Sarah [name] is a 86 y.o. female with PMH OAB on trospium, dysuria, and vaginal atrophy who previously failed vaginal estrogen cream who presents today for evaluation of possible UTI.  Today, patient reports a 1 day history of dysuria and lower abdominal pressure that acutely worsened this afternoon.  She denies fever, chills, nausea, and vomiting.  In-office UA today positive for 1+ blood and 2+ leukocyte esterase; urine microscopy with >30 WBCs/HPF, 3-10 RBCs/HPF, and moderate bacteria.   PMH: Past Medical History:  Diagnosis Date  . Benign essential HTN 09/21/2015  . Cancer ([place])    Skin CA resected from Right eyelid and both legs.  . Seizures ([place])   . Thyroid disease     Surgical History: Past Surgical History:  Procedure Laterality Date  . ABDOMINAL HYSTERECTOMY    . BREAST BIOPSY Bilateral    neg    Home Medications:  Allergies as of 03/09/2020      Reactions   Codeine Other (See Comments)   Makes her disoriented   Hydrocodone Other (See Comments)   Patient states she was seeing things that weren't there and very disoriented.   Oxycodone Other (See Comments)   hallucinations   Peanuts [peanut Oil] Other (See Comments)   Violent Migraines   Penicillins Nausea Only   Tramadol Other (See Comments)   Dizziness.      Medication List       Accurate as of March 09, 2020  3:40 PM. If you have any questions, ask your nurse or doctor.        beta carotene w/minerals tablet Take 1 tablet by mouth daily.   dicyclomine 10 MG capsule Commonly known as: BENTYL Take 10 mg by mouth 4 (four) times daily -  before meals and at bedtime.   gabapentin 100 MG capsule Commonly known as: NEURONTIN Take 300 mg by mouth 4 (four) times daily.   hydrOXYzine 10 MG tablet Commonly known as: ATARAX/VISTARIL Take 1 tablet by  mouth as needed.   lovastatin 20 MG tablet Commonly known as: MEVACOR Take 20 mg by mouth at bedtime.   Lumigan 0.01 % Soln Generic drug: bimatoprost Place 1 drop into the left eye at bedtime.   melatonin 1 MG Tabs tablet Take 10 mg by mouth once.   montelukast 10 MG tablet Commonly known as: SINGULAIR Take 10 mg by mouth at bedtime.   sertraline 50 MG tablet Commonly known as: ZOLOFT Take 1 tablet by mouth daily.   simvastatin 10 MG tablet Commonly known as: ZOCOR Take 10 mg by mouth at bedtime.   sulfamethoxazole-trimethoprim 800-160 MG tablet Commonly known as: BACTRIM DS Take 1 tablet by mouth 2 (two) times daily for 5 days. Started by: Debroah Loop, PA-C   Synthroid 112 MCG tablet Generic drug: levothyroxine Take 1 tablet by mouth daily.   Trospium Chloride 60 MG Cp24 Take by mouth.       Allergies:  Allergies  Allergen Reactions  . Codeine Other (See Comments)    Makes her disoriented  . Hydrocodone Other (See Comments)    Patient states she was seeing things that weren't there and very disoriented.  . Oxycodone Other (See Comments)    hallucinations  . Peanuts [Peanut Oil] Other (See Comments)    Violent Migraines  . Penicillins Nausea Only  . Tramadol Other (See Comments)  Dizziness.    Family History: Family History  Problem Relation Age of Onset  . Breast cancer Mother 81  . Breast cancer Maternal Uncle 60  . Breast cancer Maternal Aunt        mat great aunt  . Breast cancer Maternal Aunt 70  . Tracheal cancer Father   . Cancer Sister   . Brain cancer Sister   . Cancer Brother     Social History:   reports that she has never smoked. She has never used smokeless tobacco. She reports that she does not drink alcohol. No history on file for drug use.  Physical Exam: BP 115/74   Pulse 91   Temp 98.2 F (36.8 C)   Wt 156 lb (70.8 kg)   BMI 23.72 kg/m   Constitutional:  Alert and oriented, no acute distress, nontoxic  appearing HEENT: [place], AT Cardiovascular: No clubbing, cyanosis, or edema Respiratory: Normal respiratory effort, no increased work of breathing Skin: No rashes, bruises or suspicious lesions Neurologic: Grossly intact, no focal deficits, moving all 4 extremities Psychiatric: Normal mood and affect  Laboratory Data: Results for orders placed or performed in visit on 03/09/20  CULTURE, URINE COMPREHENSIVE   Specimen: Urine   UR  Result Value Ref Range   Urine Culture, Comprehensive Preliminary report (A)    Organism ID, Bacteria Gram negative rods (A)   Microscopic Examination   Urine  Result Value Ref Range   WBC, UA >30 (A) 0 - 5 /hpf   RBC 3-10 (A) 0 - 2 /hpf   Epithelial Cells (non renal) 0-10 0 - 10 /hpf   Bacteria, UA Moderate (A) None seen/Few  Urinalysis, Complete  Result Value Ref Range   Specific Gravity, UA 1.015 1.005 - 1.030   pH, UA 7.0 5.0 - 7.5   Color, UA Yellow Yellow   Appearance Ur Cloudy (A) Clear   Leukocytes,UA 2+ (A) Negative   Protein,UA Negative Negative/Trace   Glucose, UA Negative Negative   Ketones, UA Negative Negative   RBC, UA WILL FOLLOW    Bilirubin, UA Negative Negative   Urobilinogen, Ur 0.2 0.2 - 1.0 mg/dL   Nitrite, UA Negative Negative   Microscopic Examination See below:    Assessment & Plan:   1. Acute cystitis with hematuria UA grossly infected today, will start empiric Bactrim DS twice daily x5 days.  She will need to repeat UA in 1 week to prove resolution of MH.  She expressed understanding. - Urinalysis, Complete - CULTURE, URINE COMPREHENSIVE - Urinalysis, Complete; Future - sulfamethoxazole-trimethoprim (BACTRIM DS) 800-160 MG tablet; Take 1 tablet by mouth 2 (two) times daily for 5 days.  Dispense: 10 tablet; Refill: 0   Return in about 1 week (around 03/16/2020) for Lab visit for UA.  Debroah Loop, PA-C  [facility] [address], [place], [place] [address] [phone]

## 2020-03-12 ENCOUNTER — Other Ambulatory Visit: Payer: Self-pay

## 2020-03-12 ENCOUNTER — Ambulatory Visit
Admission: RE | Admit: 2020-03-12 | Discharge: 2020-03-12 | Disposition: A | Discharge status: Home / Self Care | Payer: Medicare Other | Source: Ambulatory Visit | Attending: Family Medicine | Admitting: Family Medicine

## 2020-03-12 DIAGNOSIS — Z1231 Encounter for screening mammogram for malignant neoplasm of breast: Secondary | ICD-10-CM | POA: Insufficient documentation

## 2020-03-13 LAB — URINALYSIS, COMPLETE
Bilirubin, UA: NEGATIVE
Glucose, UA: NEGATIVE
Ketones, UA: NEGATIVE
Nitrite, UA: NEGATIVE
Protein,UA: NEGATIVE
Specific Gravity, UA: 1.015 (ref 1.005–1.030)
Urobilinogen, Ur: 0.2 mg/dL (ref 0.2–1.0)
pH, UA: 7 (ref 5.0–7.5)

## 2020-03-13 LAB — CULTURE, URINE COMPREHENSIVE

## 2020-03-13 LAB — MICROSCOPIC EXAMINATION: WBC, UA: >30 /hpf — AB (ref 0–5)

## 2020-03-16 ENCOUNTER — Other Ambulatory Visit: Payer: Self-pay

## 2020-03-16 ENCOUNTER — Ambulatory Visit (INDEPENDENT_AMBULATORY_CARE_PROVIDER_SITE_OTHER): Payer: Medicare Other | Admitting: Physician Assistant

## 2020-03-16 ENCOUNTER — Encounter: Payer: Self-pay | Admitting: Physician Assistant

## 2020-03-16 VITALS — BP 124/71 | HR 106 | Ht 68.0 in | Wt 153.0 lb

## 2020-03-16 DIAGNOSIS — R300 Dysuria: Secondary | ICD-10-CM

## 2020-03-16 NOTE — Progress Notes (Signed)
In and Out Catheterization  Patient is present today for a I & O catheterization due to [place]. Patient was cleaned and prepped in a sterile fashion with betadine . A 14FR cath was inserted no complications were noted , 103ml of urine return was noted, urine was yellow in color. A clean urine sample was collected for UA. Bladder was drained and catheter was removed without difficulty.    Performed by: Debroah Loop, PA-C   Follow up/ Additional notes: MH resolved today after completion of culture-appropriate abx for acute cystitis. No further intervention indicated.

## 2020-03-19 LAB — URINALYSIS, COMPLETE
Bilirubin, UA: NEGATIVE
Glucose, UA: NEGATIVE
Ketones, UA: NEGATIVE
Nitrite, UA: NEGATIVE
Protein,UA: NEGATIVE
Specific Gravity, UA: 1.015 (ref 1.005–1.030)
Urobilinogen, Ur: 0.2 mg/dL (ref 0.2–1.0)
pH, UA: 5.5 (ref 5.0–7.5)

## 2020-03-19 LAB — MICROSCOPIC EXAMINATION: Bacteria, UA: NONE SEEN

## 2020-04-04 ENCOUNTER — Encounter: Payer: Self-pay | Admitting: Student in an Organized Health Care Education/Training Program

## 2020-04-04 NOTE — Progress Notes (Signed)
Patient reports that she fell getting out of her car and hit the cement. States she was told she passed out and BP was too low.  Then she also reports that she fell at her son's house and injured her shoulder.  She reports that she had a bladder infection.  Now she reports that she has vertigo.  I told her I would let Dr Holley Raring know this information.

## 2020-04-05 ENCOUNTER — Encounter: Payer: Self-pay | Admitting: Student in an Organized Health Care Education/Training Program

## 2020-04-05 ENCOUNTER — Ambulatory Visit
Payer: Medicare Other | Attending: Student in an Organized Health Care Education/Training Program | Admitting: Student in an Organized Health Care Education/Training Program

## 2020-04-05 ENCOUNTER — Other Ambulatory Visit: Payer: Self-pay

## 2020-04-05 DIAGNOSIS — G894 Chronic pain syndrome: Secondary | ICD-10-CM | POA: Diagnosis not present

## 2020-04-05 DIAGNOSIS — M47812 Spondylosis without myelopathy or radiculopathy, cervical region: Secondary | ICD-10-CM | POA: Diagnosis not present

## 2020-04-05 NOTE — Progress Notes (Signed)
Patient: Sarah [name]  Service Category: E/M  Provider: Gillis Santa, MD  DOB: [birth year]  DOS: 04/05/2020  Location: Office  MRN: [id number]  Setting: Ambulatory outpatient  Referring Provider: Hortencia Pilar, MD  Type: Established Patient  Specialty: Interventional Pain Management  PCP: Hortencia Pilar, MD  Location: Home  Delivery: TeleHealth     Virtual Encounter - Pain Management PROVIDER NOTE: Information contained herein reflects review and annotations entered in association with encounter. Interpretation of such information and data should be left to medically-trained personnel. Information provided to patient can be located elsewhere in the medical record under "Patient Instructions". Document created using STT-dictation technology, any transcriptional errors that may result from process are unintentional.    Contact & Pharmacy Preferred: [phone] Home: [phone] (home) Mobile: [phone] (mobile) E-mail: [email or web address]  [id number] [address]Phone: [phone]: [phone] [facility] [place]Phone: [phone]: [phone]  Pre-screening  Ms. GStlouisoffered "in-person" vs "virtual" encounter. She indicated preferring virtual for this encounter.   Reason COVID-19*  Social distancing based on CDC and AMA recommendations.   I contacted PEdwena Bundeon 04/05/2020 via video conference.      I clearly identified myself as BGillis Santa MD. I verified that I was speaking with the correct person using two identifiers (Name: PChayah Mckee and date of birth: 1Apr 16, 1933.  Consent I sought verbal advanced consent from [name]. I informed Ms. GHittleof possible security and privacy concerns, risks, and limitations associated with  providing "not-in-person" medical evaluation and management services. I also informed Ms. GArseneauof the availability of "in-person" appointments. Finally, I informed her that there would be a charge for the virtual visit and that she could be  personally, fully or partially, financially responsible for it. Ms. GGrefeexpressed understanding and agreed to proceed.   Historic Elements   Ms. PTeal Rabenis a 86y.o. year old, female patient evaluated today after her last contact with our practice on 03/06/2020. Ms. GTroost has a past medical history of Benign essential HTN (09/21/2015), Cancer ([place], Seizures ([place], and Thyroid disease. She also  has a past surgical history that includes Abdominal hysterectomy and Breast biopsy (Bilateral). Ms. GKetterhas a current medication list which includes the following prescription(s): beta carotene w/minerals, dicyclomine, duloxetine, gabapentin, hydroxyzine, lovastatin, lumigan, melatonin, montelukast, simvastatin, synthroid, trospium chloride, and sertraline. She  reports that she has never smoked. She has never used smokeless tobacco. She reports that she does not drink alcohol. No history on file for drug use. Ms. GSiegenthaleris allergic to codeine, hydrocodone, oxycodone, peanuts [peanut oil], penicillins, and tramadol.   HPI  Today, she is being contacted for a post-procedure assessment.   Patient unfortunately had a fall due to low blood pressure and urinary tract infection that caused her to sustain a clavicle fracture.  She fell and broke her right clavicle.  She was treated with Keflex for her UTI.  Her dizziness also improved.  In regards to her neck pain after her cervical facet medial branch nerve block performed on 03/05/2020, please see below for detailed assessment.  Post-Procedure Evaluation  Procedure:   Type: Cervical Facet Medial Branch Block(s)  #1  Primary Purpose: Diagnostic Region: Posterolateral cervical spine Level:  C4, C5, C6, &  C7 Medial Branch  Level(s). Injecting these levels blocks the  C4-5, C5-6, and C6-7 cervical facet joints. Laterality: Bilateral  Sedation: Please see nurses note.  Effectiveness during initial hour after procedure(Ultra-Short Term Relief): 100 %  Local anesthetic used: Long-acting (4-6 hours) Effectiveness: Defined as any analgesic benefit obtained secondary to the administration of local anesthetics. This carries significant diagnostic value as to the etiological location, or anatomical origin, of the pain. Duration of benefit is expected to coincide with the duration of the local anesthetic used.  Effectiveness during initial 4-6 hours after procedure(Short-Term Relief): 100 %  Long-term benefit: Defined as any relief past the pharmacologic duration of the local anesthetics.  Effectiveness past the initial 6 hours after procedure(Long-Term Relief): 100 % (things were going well until she fell, has fallen a couple of times and this has caused the pain to be worse.)   Current benefits: Defined as benefit that persist at this time.   Analgesia:  <50% better Function: Back to baseline ROM: Back to baseline  Patient states that she was receiving significant analgesic benefit and improvement in her cervical range of motion prior to her fall.  She states that her pain has returned since her fall.  This is also likely related to her clavicle fracture.  She is requesting to repeat cervical facet medial branch nerve blocks that were done in June which provided her with significant pain relief in regards to her neck pain.  This is reasonable.  Laboratory Chemistry Profile   Renal Lab Results  Component Value Date   BUN 23 (H) 09/14/2015   CREATININE 0.80 09/14/2015   GFRAA >60 09/14/2015   GFRNONAA >60 09/14/2015     Hepatic Lab Results  Component Value Date   AST 19 11/26/2015   ALT 15 11/26/2015   ALBUMIN 4.7 11/26/2015   ALKPHOS 64 11/26/2015     Electrolytes Lab Results  Component  Value Date   NA 141 09/14/2015   K 3.5 09/14/2015   CL 107 09/14/2015   CALCIUM 9.7 09/14/2015   MG 2.2 09/14/2015     Bone No results found for: VD25OH, VD125OH2TOT, WF0932TF5, DD2202RK2, 25OHVITD1, 25OHVITD2, 25OHVITD3, TESTOFREE, TESTOSTERONE   Inflammation (CRP: Acute Phase) (ESR: Chronic Phase) No results found for: CRP, ESRSEDRATE, LATICACIDVEN     Note: Above Lab results reviewed.   Imaging  MM 3D SCREEN BREAST BILATERAL CLINICAL DATA:  Screening.  EXAM: DIGITAL SCREENING BILATERAL MAMMOGRAM WITH TOMO AND CAD  COMPARISON:  Previous exam(s).  ACR Breast Density Category b: There are scattered areas of fibroglandular density.  FINDINGS: There are no findings suspicious for malignancy. Images were processed with CAD.  IMPRESSION: No mammographic evidence of malignancy. A result letter of this screening mammogram will be mailed directly to the patient.  RECOMMENDATION: Screening mammogram in one year. (Code:[id number]-01Y)  BI-RADS CATEGORY  1: Negative.  Electronically Signed   By: Lajean Manes M.D.   On: 03/12/2020 13:10  Assessment  The primary encounter diagnosis was Cervical facet joint syndrome. Diagnoses of Cervical spondylosis and Chronic pain syndrome were also pertinent to this visit.  Plan of Care  Ms. Ashleymarie Granderson has a current medication list which includes the following long-term medication(s): dicyclomine, duloxetine, lovastatin, montelukast, simvastatin, synthroid, and sertraline. Orders:  Orders Placed This Encounter  Procedures  . CERVICAL FACET (MEDIAL BRANCH NERVE BLOCK)     Standing Status:   Future    Standing Expiration Date:   05/06/2020    Scheduling Instructions:     Side: Bilateral  Level: C4-5, C5-6, C6-7 Facet joints (C4, C5, C6, & C7 Medial Branch Nerves)     Sedation: with     Timeframe: 3 weeks    Order Specific Question:   Where will this procedure be performed?    Answer:   [facility]   Follow-up  plan:   Return in about 4 weeks (around 05/03/2020) for C4-7 C-fcts #2, with sedation.     Finds benefit with bilateral sacroiliac joint injection, previous one 03/14/2019 and then second 1 05/25/2019.  Also finds lumbar epidural steroid injections for when she has sciatic flare, consider repeating.  Previous one performed on 10/13/2018 at [facility].  Status post bilateral C4, C5, C6, C7 cervical facet medial branch nerve block on 03/05/2020 for cervical facet arthropathy on 03/05/2020, very helpful however patient did experience a fall resulting in a clavicle fracture that has caused her pain to return.        Recent Visits Date Type Provider Dept  03/05/20 Procedure visit Gillis Santa, MD [facility]  03/01/20 Office Visit Gillis Santa, MD [facility]  Showing recent visits within past 90 days and meeting all other requirements Today's Visits Date Type Provider Dept  04/05/20 Telemedicine Gillis Santa, MD [facility]  Showing today's visits and meeting all other requirements Future Appointments No visits were found meeting these conditions. Showing future appointments within next 90 days and meeting all other requirements  I discussed the assessment and treatment plan with the patient. The patient was provided an opportunity to ask questions and all were answered. The patient agreed with the plan and demonstrated an understanding of the instructions.  Patient advised to call back or seek an in-person evaluation if the symptoms or condition worsens.  Duration of encounter: 30 minutes.  Note by: Gillis Santa, MD Date: 04/05/2020; Time: 8:34 AM

## 2020-04-30 ENCOUNTER — Ambulatory Visit: Payer: Medicare Other | Admitting: Student in an Organized Health Care Education/Training Program

## 2020-05-21 ENCOUNTER — Ambulatory Visit (HOSPITAL_BASED_OUTPATIENT_CLINIC_OR_DEPARTMENT_OTHER): Payer: Medicare Other | Admitting: Student in an Organized Health Care Education/Training Program

## 2020-05-21 ENCOUNTER — Ambulatory Visit
Admission: RE | Admit: 2020-05-21 | Discharge: 2020-05-21 | Disposition: A | Discharge status: Home / Self Care | Payer: Medicare Other | Source: Ambulatory Visit | Attending: Student in an Organized Health Care Education/Training Program | Admitting: Student in an Organized Health Care Education/Training Program

## 2020-05-21 ENCOUNTER — Encounter: Payer: Self-pay | Admitting: Physician Assistant

## 2020-05-21 ENCOUNTER — Ambulatory Visit (INDEPENDENT_AMBULATORY_CARE_PROVIDER_SITE_OTHER): Payer: Medicare Other | Admitting: Physician Assistant

## 2020-05-21 ENCOUNTER — Other Ambulatory Visit: Payer: Self-pay

## 2020-05-21 ENCOUNTER — Encounter: Payer: Self-pay | Admitting: Student in an Organized Health Care Education/Training Program

## 2020-05-21 VITALS — BP 131/75 | HR 97 | Ht 68.0 in | Wt 158.0 lb

## 2020-05-21 VITALS — BP 156/97 | HR 71 | Temp 97.7°F | Resp 16 | Ht 68.0 in | Wt 158.0 lb

## 2020-05-21 DIAGNOSIS — N952 Postmenopausal atrophic vaginitis: Secondary | ICD-10-CM

## 2020-05-21 DIAGNOSIS — N390 Urinary tract infection, site not specified: Secondary | ICD-10-CM

## 2020-05-21 DIAGNOSIS — G894 Chronic pain syndrome: Secondary | ICD-10-CM

## 2020-05-21 DIAGNOSIS — M47812 Spondylosis without myelopathy or radiculopathy, cervical region: Secondary | ICD-10-CM | POA: Insufficient documentation

## 2020-05-21 DIAGNOSIS — N3281 Overactive bladder: Secondary | ICD-10-CM | POA: Diagnosis not present

## 2020-05-21 MED ORDER — NITROFURANTOIN MONOHYD MACRO 100 MG PO CAPS: 100.0000 mg | ORAL_CAPSULE | Freq: Two times a day (BID) | ORAL | 0 refills | Status: AC

## 2020-05-21 MED ORDER — MIRABEGRON ER 25 MG PO TB24: 25.0000 mg | ORAL_TABLET | Freq: Every day | ORAL | 2 refills | Status: DC

## 2020-05-21 MED ORDER — LIDOCAINE HCL 2 % IJ SOLN
20.0000 mL | Freq: Once | INTRAMUSCULAR | Status: AC
  Administered 2020-05-21: 400 mg

## 2020-05-21 MED ORDER — ROPIVACAINE HCL 2 MG/ML IJ SOLN
9.0000 mL | Freq: Once | INTRAMUSCULAR | Status: AC
  Administered 2020-05-21: 9 mL via PERINEURAL
  Filled 2020-05-21: qty 10

## 2020-05-21 MED ORDER — DEXAMETHASONE SODIUM PHOSPHATE 10 MG/ML IJ SOLN
10.0000 mg | Freq: Once | INTRAMUSCULAR | Status: AC
  Administered 2020-05-21: 10 mg
  Filled 2020-05-21: qty 1

## 2020-05-21 MED ORDER — ESTRADIOL 0.1 MG/GM VA CREA: TOPICAL_CREAM | VAGINAL | 12 refills | Status: DC

## 2020-05-21 MED ORDER — LIDOCAINE HCL 2 % IJ SOLN
INTRAMUSCULAR | Status: AC
  Filled 2020-05-21: qty 20

## 2020-05-21 NOTE — Patient Instructions (Signed)
____________________________________________________________________________________________  Post-Procedure Discharge Instructions  Instructions:  Apply ice:   Purpose: This will minimize any swelling and discomfort after procedure.   When: Day of procedure, as soon as you get home.  How: Fill a plastic sandwich bag with crushed ice. Cover it with a small towel and apply to injection site.  How long: (15 min on, 15 min off) Apply for 15 minutes then remove x 15 minutes.  Repeat sequence on day of procedure, until you go to bed.  Apply heat:   Purpose: To treat any soreness and discomfort from the procedure.  When: Starting the next day after the procedure.  How: Apply heat to procedure site starting the day following the procedure.  How long: May continue to repeat daily, until discomfort goes away.  Food intake: Start with clear liquids (like water) and advance to regular food, as tolerated.   Physical activities: Keep activities to a minimum for the first 8 hours after the procedure. After that, then as tolerated.  Driving: If you have received any sedation, be responsible and do not drive. You are not allowed to drive for 24 hours after having sedation.  Blood thinner: (Applies only to those taking blood thinners) You may restart your blood thinner 6 hours after your procedure.  Insulin: (Applies only to Diabetic patients taking insulin) As soon as you can eat, you may resume your normal dosing schedule.  Infection prevention: Keep procedure site clean and dry. Shower daily and clean area with soap and water.  Post-procedure Pain Diary: Extremely important that this be done correctly and accurately. Recorded information will be used to determine the next step in treatment. For the purpose of accuracy, follow these rules:  Evaluate only the area treated. Do not report or include pain from an untreated area. For the purpose of this evaluation, ignore all other areas of pain,  except for the treated area.  After your procedure, avoid taking a long nap and attempting to complete the pain diary after you wake up. Instead, set your alarm clock to go off every hour, on the hour, for the initial 8 hours after the procedure. Document the duration of the numbing medicine, and the relief you are getting from it.  Do not go to sleep and attempt to complete it later. It will not be accurate. If you received sedation, it is likely that you were given a medication that may cause amnesia. Because of this, completing the diary at a later time may cause the information to be inaccurate. This information is needed to plan your care.  Follow-up appointment: Keep your post-procedure follow-up evaluation appointment after the procedure (usually 2 weeks for most procedures, 6 weeks for radiofrequencies). DO NOT FORGET to bring you pain diary with you.   Expect: (What should I expect to see with my procedure?)  From numbing medicine (AKA: Local Anesthetics): Numbness or decrease in pain. You may also experience some weakness, which if present, could last for the duration of the local anesthetic.  Onset: Full effect within 15 minutes of injected.  Duration: It will depend on the type of local anesthetic used. On the average, 1 to 8 hours.   From steroids (Applies only if steroids were used): Decrease in swelling or inflammation. Once inflammation is improved, relief of the pain will follow.  Onset of benefits: Depends on the amount of swelling present. The more swelling, the longer it will take for the benefits to be seen. In some cases, up to 10 days.    Duration: Steroids will stay in the system x 2 weeks. Duration of benefits will depend on multiple posibilities including persistent irritating factors.  Side-effects: If present, they may typically last 2 weeks (the duration of the steroids).  Frequent: Cramps (if they occur, drink Gatorade and take over-the-counter Magnesium 450-500 mg  once to twice a day); water retention with temporary weight gain; increases in blood sugar; decreased immune system response; increased appetite.  Occasional: Facial flushing (red, warm cheeks); mood swings; menstrual changes.  Uncommon: Long-term decrease or suppression of natural hormones; bone thinning. (These are more common with higher doses or more frequent use. This is why we prefer that our patients avoid having any injection therapies in other practices.)   Very Rare: Severe mood changes; psychosis; aseptic necrosis.  From procedure: Some discomfort is to be expected once the numbing medicine wears off. This should be minimal if ice and heat are applied as instructed.  Call if: (When should I call?)  You experience numbness and weakness that gets worse with time, as opposed to wearing off.  New onset bowel or bladder incontinence. (Applies only to procedures done in the spine)  Emergency Numbers:  Durning business hours (Monday - Thursday, 8:00 AM - 4:00 PM) (Friday, 9:00 AM - 12:00 Noon): [phone]  After hours: [phone]  NOTE: If you are having a problem and are unable connect with, or to talk to a provider, then go to your nearest urgent care or emergency department. If the problem is serious and urgent, please call 911. ____________________________________________________________________________________________   Facet Joint Block The facet joints connect the bones of the spine (vertebrae). They make it possible for you to bend, twist, and make other movements with your spine. They also keep you from bending too far, twisting too far, and making other extreme movements. A facet joint block is a procedure in which a numbing medicine (anesthetic) is injected into a facet joint. In many cases, an anti-inflammatory medicine (steroid) is also injected. A facet joint block may be done:  To diagnose neck or back pain. If the pain gets better after a facet joint block,  it means the pain is probably coming from the facet joint. If the pain does not get better, it means the pain is probably not coming from the facet joint.  To relieve neck or back pain that is caused by an inflamed facet joint. A facet joint block is only done to relieve pain if the pain does not improve with other methods, such as medicine, exercise programs, and physical therapy. Tell a health care provider about:  Any allergies you have.  All medicines you are taking, including vitamins, herbs, eye drops, creams, and over-the-counter medicines.  Any problems you or family members have had with anesthetic medicines.  Any blood disorders you have.  Any surgeries you have had.  Any medical conditions you have or have had.  Whether you are pregnant or may be pregnant. What are the risks? Generally, this is a safe procedure. However, problems may occur, including:  Bleeding.  Injury to a nerve near the injection site.  Pain at the injection site.  Weakness or numbness in areas controlled by nerves near the injection site.  Infection.  Temporary fluid retention.  Allergic reactions to medicines or dyes.  Injury to other structures or organs near the injection site. What happens before the procedure? Medicines Ask your health care provider about:  Changing or stopping your regular medicines. This is especially important if you  are taking diabetes medicines or blood thinners.  Taking medicines such as aspirin and ibuprofen. These medicines can thin your blood. Do not take these medicines unless your health care provider tells you to take them.  Taking over-the-counter medicines, vitamins, herbs, and supplements. Eating and drinking Follow instructions from your health care provider about eating and drinking, which may include:  8 hours before the procedure - stop eating heavy meals or foods, such as meat, fried foods, or fatty foods.  6 hours before the procedure - stop  eating light meals or foods, such as toast or cereal.  6 hours before the procedure - stop drinking milk or drinks that contain milk.  2 hours before the procedure - stop drinking clear liquids. Staying hydrated Follow instructions from your health care provider about hydration, which may include:  Up to 2 hours before the procedure - you may continue to drink clear liquids, such as water, clear fruit juice, black coffee, and plain tea. General instructions  Do not use any products that contain nicotine or tobacco for at least 4-6 weeks before the procedure. These products include cigarettes, e-cigarettes, and chewing tobacco. If you need help quitting, ask your health care provider.  Plan to have someone take you home from the hospital or clinic.  Ask your health care provider: ? How your surgery site will be marked. ? What steps will be taken to help prevent infection. These may include:  Removing hair at the surgery site.  Washing skin with a germ-killing soap.  Receiving antibiotic medicine. What happens during the procedure?   You will put on a hospital gown.  You will lie on your stomach on an X-ray table. You may be asked to lie in a different position if an injection will be made in your neck.  Machines will be used to monitor your oxygen levels, heart rate, and blood pressure.  Your skin will be cleaned.  If an injection will be made in your neck, an IV will be inserted into one of your veins. Fluids and medicine will flow directly into your body through the IV.  A numbing medicine (local anesthetic) will be applied to your skin. Your skin may sting or burn for a moment.  A video X-ray machine (fluoroscopy) will be used to find the joint. In some cases, a CT scan may be used.  A contrast dye may be injected into the facet joint area to help find the joint.  When the joint is located, an anesthetic will be injected into the joint through the needle.  Your health  care provider will ask you whether you feel pain relief. ? If you feel relief, a steroid may be injected to provide pain relief for a longer period of time. ? If you do not feel relief or feel only partial relief, additional injections of an anesthetic may be made in other facet joints.  The needle will be removed.  Your skin will be cleaned.  A bandage (dressing) will be applied over each injection site. The procedure may vary among health care providers and hospitals. What happens after the procedure?  Your blood pressure, heart rate, breathing rate, and blood oxygen level will be monitored until you leave the hospital or clinic.  You will lie down and rest for a period of time. Summary  A facet joint block is a procedure in which a numbing medicine (anesthetic) is injected into a facet joint. An anti-inflammatory medicine (stereoid) may also be injected.  Follow  instructions from your health care provider about medicines and eating and drinking before the procedure.  Do not use any products that contain nicotine or tobacco for at least 4-6 weeks before the procedure.  You will lie on your stomach for the procedure, but you may be asked to lie in a different position if an injection will be made in your neck.  When the joint is located, an anesthetic will be injected into the joint through the needle. This information is not intended to replace advice given to you by your health care provider. Make sure you discuss any questions you have with your health care provider. Document Revised: 12/30/2018 Document Reviewed: 08/13/2018 [employer] Patient Education  2020 [employer]  

## 2020-05-21 NOTE — Progress Notes (Signed)
05/21/2020 1:38 PM   Sarah [name] [birth year] [id number]  CC: Chief Complaint  Patient presents with  . Dysuria   HPI: Sarah [name] is a 86 y.o. female with PMH OAB previously on trospium, dysuria, and vaginal atrophy who previously failed vaginal estrogen cream who presents today for evaluation of possible UTI.  I saw her in clinic most recently on 03/09/2020 for the same.  Urine culture ultimately grew pansensitive E. coli; I treated her with Bactrim DS twice daily x5 days.  Today she reports a 2-day history of burning with urination, urgency, frequency, and malodorous urine.  She states the burning is located on her labia when her urine hits her skin.  She was recently hospitalized at [facility] after sustaining several falls.  She was found to have acute cystitis at this time with urine culture positive for multidrug-resistant E. coli treated with ceftriaxone.  She was taken off trospium at that time and notes no change in her OAB symptoms since discontinuing this medication.  She does have a history of constipation that has been worse over the last 6 months to 1 year.  She takes MiraLAX for management of her chronic constipation.  She is on a daily probiotic.  She does not take anything else for UTI prevention.  In-office UA today pan negative; urine microscopy with 11-30 WBCs/HPF and 3-10 RBCs/HPF.  PMH: Past Medical History:  Diagnosis Date  . Benign essential HTN 09/21/2015  . Cancer ([place])    Skin CA resected from Right eyelid and both legs.  . Seizures ([place])   . Thyroid disease     Surgical History: Past Surgical History:  Procedure Laterality Date  . ABDOMINAL HYSTERECTOMY    . BREAST BIOPSY Bilateral    neg    Home Medications:  Allergies as of 05/21/2020      Reactions   Codeine Other (See Comments)   Makes her disoriented   Hydrocodone Other (See Comments)   Patient states she was seeing things that weren't there and very disoriented.    Oxycodone Other (See Comments)   hallucinations   Peanuts [peanut Oil] Other (See Comments)   Violent Migraines   Penicillins Nausea Only   Tramadol Other (See Comments)   Dizziness.      Medication List       Accurate as of May 21, 2020 11:59 PM. If you have any questions, ask your nurse or doctor.        STOP taking these medications   Trospium Chloride 60 MG Cp24 Stopped by: Debroah Loop, PA-C     TAKE these medications   acetaminophen 500 MG tablet Commonly known as: TYLENOL Take 1,000 mg by mouth every 6 (six) hours as needed.   beta carotene w/minerals tablet Take 1 tablet by mouth daily.   diclofenac Sodium 1 % Gel Commonly known as: VOLTAREN Apply 1 application topically 3 times/day as needed-between meals & bedtime.   dicyclomine 10 MG capsule Commonly known as: BENTYL Take 10 mg by mouth 4 (four) times daily -  before meals and at bedtime.   DULoxetine 20 MG capsule Commonly known as: CYMBALTA Take 20 mg by mouth daily.   estradiol 0.1 MG/GM vaginal cream Commonly known as: ESTRACE VAGINAL Apply one pea-sized amount around the opening of the urethra three times weekly. Started by: Debroah Loop, PA-C   gabapentin 100 MG capsule Commonly known as: NEURONTIN Take 300 mg by mouth 4 (four) times daily.   hydrOXYzine 10 MG tablet Commonly known  as: ATARAX/VISTARIL Take 1 tablet by mouth as needed.   lovastatin 20 MG tablet Commonly known as: MEVACOR Take 20 mg by mouth at bedtime.   lovastatin 20 MG tablet Commonly known as: MEVACOR Take 20 mg by mouth daily.   Lumigan 0.01 % Soln Generic drug: bimatoprost Place 1 drop into the left eye at bedtime.   melatonin 1 MG Tabs tablet Take 10 mg by mouth once.   mirabegron ER 25 MG Tb24 tablet Commonly known as: MYRBETRIQ Take 1 tablet (25 mg total) by mouth daily. Started by: Debroah Loop, PA-C   montelukast 10 MG tablet Commonly known as: SINGULAIR Take 10 mg by  mouth at bedtime.   nitrofurantoin (macrocrystal-monohydrate) 100 MG capsule Commonly known as: MACROBID Take 1 capsule (100 mg total) by mouth every 12 (twelve) hours for 5 days. Started by: Debroah Loop, PA-C   sertraline 50 MG tablet Commonly known as: ZOLOFT Take 1 tablet by mouth daily.   simvastatin 10 MG tablet Commonly known as: ZOCOR Take 10 mg by mouth at bedtime.   Synthroid 112 MCG tablet Generic drug: levothyroxine Take 1 tablet by mouth daily.       Allergies:  Allergies  Allergen Reactions  . Codeine Other (See Comments)    Makes her disoriented  . Hydrocodone Other (See Comments)    Patient states she was seeing things that weren't there and very disoriented.  . Oxycodone Other (See Comments)    hallucinations  . Peanuts [Peanut Oil] Other (See Comments)    Violent Migraines  . Penicillins Nausea Only  . Tramadol Other (See Comments)    Dizziness.    Family History: Family History  Problem Relation Age of Onset  . Breast cancer Mother 66  . Breast cancer Maternal Uncle 60  . Breast cancer Maternal Aunt        mat great aunt  . Breast cancer Maternal Aunt 71  . Tracheal cancer Father   . Cancer Sister   . Brain cancer Sister   . Cancer Brother     Social History:   reports that she has never smoked. She has never used smokeless tobacco. She reports that she does not drink alcohol. No history on file for drug use.  Physical Exam: BP 131/75   Pulse 97   Ht 5\' 8"  (1.727 m)   Wt 158 lb (71.7 kg)   BMI 24.02 kg/m   Constitutional:  Alert and oriented, no acute distress, nontoxic appearing HEENT: [place], AT Cardiovascular: No clubbing, cyanosis, or edema Respiratory: Normal respiratory effort, no increased work of breathing Skin: No rashes, bruises or suspicious lesions Neurologic: Grossly intact, no focal deficits, moving all 4 extremities Psychiatric: Normal mood and affect  Laboratory Data: Results for orders placed or performed  in visit on 05/21/20  Microscopic Examination   Urine  Result Value Ref Range   WBC, UA 11-30 (A) 0 - 5 /hpf   RBC 3-10 (A) 0 - 2 /hpf   Epithelial Cells (non renal) 0-10 0 - 10 /hpf   Bacteria, UA None seen None seen/Few  Urinalysis, Complete  Result Value Ref Range   Specific Gravity, UA 1.020 1.005 - 1.030   pH, UA 6.5 5.0 - 7.5   Color, UA Yellow Yellow   Appearance Ur Clear Clear   Leukocytes,UA Negative Negative   Protein,UA Negative Negative/Trace   Glucose, UA Negative Negative   Ketones, UA Negative Negative   RBC, UA Negative Negative   Bilirubin, UA Negative Negative  Urobilinogen, Ur 1.0 0.2 - 1.0 mg/dL   Nitrite, UA Negative Negative   Microscopic Examination See below:    Assessment & Plan:   1. Recurrent UTI UA notable for pyuria and microscopic hematuria consistent with possible UTI today.  Will start empiric Macrobid and send for culture for further evaluation.   For UTI prevention, I counseled her to continue MiraLAX for constipation management, start an over-the-counter cranberry supplement twice daily, continue her daily probiotic, and start topical vaginal estrogen cream as below. - Urinalysis, Complete - CULTURE, URINE COMPREHENSIVE - nitrofurantoin, macrocrystal-monohydrate, (MACROBID) 100 MG capsule; Take 1 capsule (100 mg total) by mouth every 12 (twelve) hours for 5 days.  Dispense: 10 capsule; Refill: 0  2. Atrophic vaginitis Patient with a known history of atrophic vaginitis, which is likely contributory to #1 above.  She previously failed Premarin due to skin burning.  I offered her Estrace today to see if this formulation would be more tolerable.  She agreed. - estradiol (ESTRACE VAGINAL) 0.1 MG/GM vaginal cream; Apply one pea-sized amount around the opening of the urethra three times weekly.  Dispense: 42.5 g; Refill: 12  3. OAB (overactive bladder) Offered patient a trial of Myrbetriq 25 mg daily for management of her OAB given recent  discontinuation of trospium and chronic history of constipation.  We will plan for symptom recheck and PVR in 1 month. - mirabegron ER (MYRBETRIQ) 25 MG TB24 tablet; Take 1 tablet (25 mg total) by mouth daily.  Dispense: 30 tablet; Refill: 2   Return in about 1 week (around 05/28/2020) for Cath UA + 1 month symptoms recheck with PVR.  Debroah Loop, PA-C  [facility] [address], [place], [place] [address] [phone]

## 2020-05-21 NOTE — Patient Instructions (Signed)
Recurrent UTI Prevention Strategies  1. Continue Miralax to manage your constipation. Your goal is to have consistent, formed bowel movements that are easy for you to pass. You may adjust the recommended dose of Miralax (one capful daily) to achieve this goal. 2. Start taking an over-the-counter cranberry supplement for urinary tract health. Take this twice daily, ideally on an empty stomach (e.g. right before bed). 3. Continue your daily probiotic. 4. Start vaginal estrogen cream. Apply a pea-sized amount around the urethra three times weekly.

## 2020-05-21 NOTE — Progress Notes (Signed)
Safety precautions to be maintained throughout the outpatient stay will include: orient to surroundings, keep bed in low position, maintain call bell within reach at all times, provide assistance with transfer out of bed and ambulation.  

## 2020-05-21 NOTE — Progress Notes (Signed)
PROVIDER NOTE: Information contained herein reflects review and annotations entered in association with encounter. Interpretation of such information and data should be left to medically-trained personnel. Information provided to patient can be located elsewhere in the medical record under "Patient Instructions". Document created using STT-dictation technology, any transcriptional errors that may result from process are unintentional.    Patient: Sarah [name]  Service Category: Procedure  Provider: Gillis Santa, MD  DOB: 1931/11/21  DOS: 05/21/2020  Location: [facility]  MRN: [id number]  Setting: Ambulatory - outpatient  Referring Provider: Hortencia Pilar, MD  Type: Established Patient  Specialty: Interventional Pain Management  PCP: Hortencia Pilar, MD   Primary Reason for Visit: Interventional Pain Management Treatment. CC: Neck Pain (right ) and Back Pain (lumbar center )  Procedure:          Anesthesia, Analgesia, Anxiolysis:  Type: Cervical Facet Medial Branch Block(s)  #2  Primary Purpose: Therapeutic Region: Posterolateral cervical spine Level:  C4, C5, C6, & C7 Medial Branch Level(s). Injecting these levels blocks the  C4-5, C5-6, and C6-7 cervical facet joints. Laterality: Bilateral  Type: Local Anesthesia Indication(s): Analgesia          Local Anesthetic: Lidocaine 1-2%  Position: Prone with head of the table raised to facilitate breathing.   Indications: 1. Cervical facet joint syndrome   2. Cervical spondylosis   3. Chronic pain syndrome    Pain Score: Pre-procedure: 6 /10 Post-procedure: 0-No pain/10   Pre-op Assessment:  Sarah [name] is a 86 y.o. (year old), female patient, seen today for interventional treatment. She  has a past surgical history that includes Abdominal hysterectomy and Breast biopsy (Bilateral). Sarah [name] has a current medication list which includes the following prescription(s): acetaminophen, beta carotene w/minerals,  diclofenac sodium, gabapentin, lovastatin, lumigan, melatonin, montelukast, synthroid, dicyclomine, duloxetine, estradiol, hydroxyzine, lovastatin, mirabegron er, nitrofurantoin (macrocrystal-monohydrate), sertraline, and simvastatin. Her primarily concern today is the Neck Pain (right ) and Back Pain (lumbar center )  Initial Vital Signs:  Pulse/HCG Rate: 81  Temp: 97.7 F (36.5 C) Resp: 16 BP: (!) 152/86 SpO2: 99 %  BMI: Estimated body mass index is 24.02 kg/m as calculated from the following:   Height as of this encounter: 5\' 8"  (1.727 m).   Weight as of this encounter: 158 lb (71.7 kg).  Risk Assessment: Allergies: Reviewed. She is allergic to codeine, hydrocodone, oxycodone, peanuts [peanut oil], penicillins, and tramadol.  Allergy Precautions: None required Coagulopathies: Reviewed. None identified.  Blood-thinner therapy: None at this time Active Infection(s): Reviewed. None identified. Sarah [name] is afebrile  Site Confirmation: Sarah [name] was asked to confirm the procedure and laterality before marking the site Procedure checklist: Completed Consent: Before the procedure and under the influence of no sedative(s), amnesic(s), or anxiolytics, the patient was informed of the treatment options, risks and possible complications. To fulfill our ethical and legal obligations, as recommended by the American Medical Association's Code of Ethics, I have informed the patient of my clinical impression; the nature and purpose of the treatment or procedure; the risks, benefits, and possible complications of the intervention; the alternatives, including doing nothing; the risk(s) and benefit(s) of the alternative treatment(s) or procedure(s); and the risk(s) and benefit(s) of doing nothing. The patient was provided information about the general risks and possible complications associated with the procedure. These may include, but are not limited to: failure to achieve desired goals, infection,  bleeding, organ or nerve damage, allergic reactions, paralysis, and death. In addition, the patient was informed of  those risks and complications associated to Spine-related procedures, such as failure to decrease pain; infection (i.e.: Meningitis, epidural or intraspinal abscess); bleeding (i.e.: epidural hematoma, subarachnoid hemorrhage, or any other type of intraspinal or peri-dural bleeding); organ or nerve damage (i.e.: Any type of peripheral nerve, nerve root, or spinal cord injury) with subsequent damage to sensory, motor, and/or autonomic systems, resulting in permanent pain, numbness, and/or weakness of one or several areas of the body; allergic reactions; (i.e.: anaphylactic reaction); and/or death. Furthermore, the patient was informed of those risks and complications associated with the medications. These include, but are not limited to: allergic reactions (i.e.: anaphylactic or anaphylactoid reaction(s)); adrenal axis suppression; blood sugar elevation that in diabetics may result in ketoacidosis or comma; water retention that in patients with history of congestive heart failure may result in shortness of breath, pulmonary edema, and decompensation with resultant heart failure; weight gain; swelling or edema; medication-induced neural toxicity; particulate matter embolism and blood vessel occlusion with resultant organ, and/or nervous system infarction; and/or aseptic necrosis of one or more joints. Finally, the patient was informed that Medicine is not an exact science; therefore, there is also the possibility of unforeseen or unpredictable risks and/or possible complications that may result in a catastrophic outcome. The patient indicated having understood very clearly. We have given the patient no guarantees and we have made no promises. Enough time was given to the patient to ask questions, all of which were answered to the patient's satisfaction. Sarah [name] has indicated that she wanted to  continue with the procedure. Attestation: I, the ordering provider, attest that I have discussed with the patient the benefits, risks, side-effects, alternatives, likelihood of achieving goals, and potential problems during recovery for the procedure that I have provided informed consent. Date  Time: 05/21/2020  8:28 AM  Pre-Procedure Preparation:  Monitoring: As per clinic protocol. Respiration, ETCO2, SpO2, BP, heart rate and rhythm monitor placed and checked for adequate function Safety Precautions: Patient was assessed for positional comfort and pressure points before starting the procedure. Time-out: I initiated and conducted the "Time-out" before starting the procedure, as per protocol. The patient was asked to participate by confirming the accuracy of the "Time Out" information. Verification of the correct person, site, and procedure were performed and confirmed by me, the nursing staff, and the patient. "Time-out" conducted as per Joint Commission's Universal Protocol (UP.01.01.01). Time: 0903  Description of Procedure:          Laterality: Bilateral. The procedure was performed in identical fashion on both sides. Level: C4, C5, C6, & C7 Medial Branch Level(s). Area Prepped: Posterior Cervico-thoracic Region DuraPrep (Iodine Povacrylex [0.7% available iodine] and Isopropyl Alcohol, 74% w/w) Safety Precautions: Aspiration looking for blood return was conducted prior to all injections. At no point did we inject any substances, as a needle was being advanced. Before injecting, the patient was told to immediately notify me if she was experiencing any new onset of "ringing in the ears, or metallic taste in the mouth". No attempts were made at seeking any paresthesias. Safe injection practices and needle disposal techniques used. Medications properly checked for expiration dates. SDV (single dose vial) medications used. After the completion of the procedure, all disposable equipment used was  discarded in the proper designated medical waste containers. Local Anesthesia: Protocol guidelines were followed. The patient was positioned over the fluoroscopy table. The area was prepped in the usual manner. The time-out was completed. The target area was identified using fluoroscopy. A 12-in long, straight, sterile hemostat  was used with fluoroscopic guidance to locate the targets for each level blocked. Once located, the skin was marked with an approved surgical skin marker. Once all sites were marked, the skin (epidermis, dermis, and hypodermis), as well as deeper tissues (fat, connective tissue and muscle) were infiltrated with a small amount of a short-acting local anesthetic, loaded on a 10cc syringe with a 25G, 1.5-in  Needle. An appropriate amount of time was allowed for local anesthetics to take effect before proceeding to the next step. Local Anesthetic: Lidocaine 2.0% The unused portion of the local anesthetic was discarded in the proper designated containers. Technical explanation of process:   C4 Medial Branch Nerve Block (MBB): The target area for the C4 dorsal medial articular branch is the lateral concave waist of the articular pillar of C4. Under fluoroscopic guidance, a Quincke needle was inserted until contact was made with os over the postero-lateral aspect of the articular pillar of C4 (target area). After negative aspiration for blood, 1 mL of the nerve block solution was injected without difficulty or complication. The needle was removed intact. C5 Medial Branch Nerve Block (MBB): The target area for the C5 dorsal medial articular branch is the lateral concave waist of the articular pillar of C5. Under fluoroscopic guidance, a Quincke needle was inserted until contact was made with os over the postero-lateral aspect of the articular pillar of C5 (target area). After negative aspiration for blood, 1 mL of the nerve block solution was injected without difficulty or complication. The  needle was removed intact. C6 Medial Branch Nerve Block (MBB): The target area for the C6 dorsal medial articular branch is the lateral concave waist of the articular pillar of C6. Under fluoroscopic guidance, a Quincke needle was inserted until contact was made with os over the postero-lateral aspect of the articular pillar of C6 (target area). After negative aspiration for blood, 1 mL of the nerve block solution was injected without difficulty or complication. The needle was removed intact. C7 Medial Branch Nerve Block (MBB): The target for the C7 dorsal medial articular branch lies on the superior-medial tip of the C7 transverse process. Under fluoroscopic guidance, a Quincke needle was inserted until contact was made with os over the postero-lateral aspect of the articular pillar of C7 (target area). After negative aspiration for blood,1 mL of the nerve block solution was injected without difficulty or complication. The needle was removed intact. Procedural Needles: 25-gauge, 3.5-inch, Quincke needles used for all levels. Nerve block solution:  The unused portion of the solution was discarded in the proper designated containers.  8 cc solution made of 6 cc of 0.2% ropivacaine, 2 cc of Decadron 10 mg/cc.  1 cc injected at each level above bilaterally.  Total steroid dose: 20 mg of Decadron  Once the entire procedure was completed, the treated area was cleaned, making sure to leave some of the prepping solution back to take advantage of its long term bactericidal properties.  Anatomy Reference Guide:       Vitals:   05/21/20 0910 05/21/20 0915 05/21/20 0920 05/21/20 0929  BP: (!) 152/95 (!) 149/96 (!) 158/98 (!) 156/97  Pulse: 78 79 78 71  Resp: 12 14 16 16   Temp:      TempSrc:      SpO2: 100% 99% 100% 99%  Weight:      Height:        Start Time: 0903 hrs. End Time: 0921 hrs.  Imaging Guidance (Spinal):  Type of Imaging Technique: Fluoroscopy Guidance (Spinal) Indication(s):  Assistance in needle guidance and placement for procedures requiring needle placement in or near specific anatomical locations not easily accessible without such assistance. Exposure Time: Please see nurses notes. Contrast: None used. Fluoroscopic Guidance: I was personally present during the use of fluoroscopy. "Tunnel Vision Technique" used to obtain the best possible view of the target area. Parallax error corrected before commencing the procedure. "Direction-depth-direction" technique used to introduce the needle under continuous pulsed fluoroscopy. Once target was reached, antero-posterior, oblique, and lateral fluoroscopic projection used confirm needle placement in all planes. Images permanently stored in EMR. Interpretation: No contrast injected. I personally interpreted the imaging intraoperatively. Adequate needle placement confirmed in multiple planes. Permanent images saved into the patient's record.  Antibiotic Prophylaxis:   Anti-infectives (From admission, onward)   None     Indication(s): None identified  Post-operative Assessment:  Post-procedure Vital Signs:  Pulse/HCG Rate: 71  Temp: 97.7 F (36.5 C) Resp: 16 BP: (!) 156/97 SpO2: 99 %  EBL: None  Complications: No immediate post-treatment complications observed by team, or reported by patient.  Note: The patient tolerated the entire procedure well. A repeat set of vitals were taken after the procedure and the patient was kept under observation following institutional policy, for this type of procedure. Post-procedural neurological assessment was performed, showing return to baseline, prior to discharge. The patient was provided with post-procedure discharge instructions, including a section on how to identify potential problems. Should any problems arise concerning this procedure, the patient was given instructions to immediately contact us, at any time, without hesitation. In any case, we plan to contact the patient by  telephone for a follow-up status report regarding this interventional procedure.  Comments:  No additional relevant information.  5 out of 5 strength bilateral upper extremity: Shoulder abduction, elbow flexion, elbow extension, thumb extension.   Plan of Care  Orders:  Orders Placed This Encounter  Procedures  . DG PAIN CL[facility] C-ARM 1-60 MIN NO REPORT    Intraoperative interpretation by procedural physician at [place].    Standing Status:   Standing    Number of Occurrences:   1    Order Specific Question:   Reason for exam:    Answer:   Assistance in needle guidance and placement for procedures requiring needle placement in or near specific anatomical locations not easily accessible without such assistance.   Medications ordered for procedure: Meds ordered this encounter  Medications  . lidocaine (XYLOCAINE) 2 % (with pres) injection 400 mg  . dexamethasone (DECADRON) injection 10 mg  . dexamethasone (DECADRON) injection 10 mg  . ropivacaine (PF) 2 mg/mL (0.2%) (NAROPIN) injection 9 mL  . ropivacaine (PF) 2 mg/mL (0.2%) (NAROPIN) injection 9 mL   Medications administered: We administered lidocaine, dexamethasone, dexamethasone, ropivacaine (PF) 2 mg/mL (0.2%), and ropivacaine (PF) 2 mg/mL (0.2%).  See the medical record for exact dosing, route, and time of administration.  Follow-up plan:   Return in about 4 weeks (around 06/18/2020) for PP Follow up F2F.      Finds benefit with bilateral sacroiliac joint injection, previous one 03/14/2019 and then second 1 05/25/2019.  Also finds lumbar epidural steroid injections for when she has sciatic flare, consider repeating.  Previous one performed on 10/13/2018 at [facility].  Status post bilateral C4, C5, C6, C7 cervical facet medial branch nerve block on 03/05/2020, 05/21/20 for cervical facet arthropathy.     Recent Visits Date Type Provider Dept  04/05/20 Telemedicine Gillis Santa,  MD [facility]  03/05/20 Procedure  visit Gillis Santa, MD [facility]  03/01/20 Office Visit Gillis Santa, MD [facility]  Showing recent visits within past 90 days and meeting all other requirements Today's Visits Date Type Provider Dept  05/21/20 Procedure visit Gillis Santa, MD [facility]  Showing today's visits and meeting all other requirements Future Appointments Date Type Provider Dept  07/12/20 Appointment Gillis Santa, MD [facility]  Showing future appointments within next 90 days and meeting all other requirements  Disposition: Discharge home  Discharge (Date  Time): 05/21/2020; 0930 hrs.   Primary Care Physician: Hortencia Pilar, MD Location: [facility] Note by: Gillis Santa, MD Date: 05/21/2020; Time: 12:15 PM  Disclaimer:  Medicine is not an exact science. The only guarantee in medicine is that nothing is guaranteed. It is important to note that the decision to proceed with this intervention was based on the information collected from the patient. The Data and conclusions were drawn from the patient's questionnaire, the interview, and the physical examination. Because the information was provided in large part by the patient, it cannot be guaranteed that it has not been purposely or unconsciously manipulated. Every effort has been made to obtain as much relevant data as possible for this evaluation. It is important to note that the conclusions that lead to this procedure are derived in large part from the available data. Always take into account that the treatment will also be dependent on availability of resources and existing treatment guidelines, considered by other Pain Management Practitioners as being common knowledge and practice, at the time of the intervention. For Medico-Legal purposes, it is also important to point out that variation in procedural techniques and pharmacological choices are the acceptable norm. The indications,  contraindications, technique, and results of the above procedure should only be interpreted and judged by a Board-Certified Interventional Pain Specialist with extensive familiarity and expertise in the same exact procedure and technique.

## 2020-05-22 ENCOUNTER — Telehealth: Payer: Self-pay

## 2020-05-22 LAB — URINALYSIS, COMPLETE
Bilirubin, UA: NEGATIVE
Glucose, UA: NEGATIVE
Ketones, UA: NEGATIVE
Leukocytes,UA: NEGATIVE
Nitrite, UA: NEGATIVE
Protein,UA: NEGATIVE
RBC, UA: NEGATIVE
Specific Gravity, UA: 1.02 (ref 1.005–1.030)
Urobilinogen, Ur: 1 mg/dL (ref 0.2–1.0)
pH, UA: 6.5 (ref 5.0–7.5)

## 2020-05-22 LAB — MICROSCOPIC EXAMINATION: Bacteria, UA: NONE SEEN

## 2020-05-22 NOTE — Telephone Encounter (Signed)
Post procedure phone call.  Patient states she is doing fine.  

## 2020-05-23 ENCOUNTER — Other Ambulatory Visit: Payer: Self-pay | Admitting: Family Medicine

## 2020-05-23 DIAGNOSIS — E041 Nontoxic single thyroid nodule: Secondary | ICD-10-CM

## 2020-05-23 LAB — CULTURE, URINE COMPREHENSIVE

## 2020-05-24 ENCOUNTER — Telehealth: Payer: Self-pay | Admitting: Physician Assistant

## 2020-05-24 NOTE — Telephone Encounter (Signed)
Please contact the patient and inform her that her recent urine culture was negative. She may stop prescribed Macrobid. I would like her to keep her scheduled follow-up appointments, including for a cath UA on 9/8. It is important that we determine if her MH resolves on a cath sample.

## 2020-05-24 NOTE — Telephone Encounter (Signed)
[facility] notifying patient as advised. Instructed patient to call office if she has any questions.

## 2020-05-30 ENCOUNTER — Ambulatory Visit (INDEPENDENT_AMBULATORY_CARE_PROVIDER_SITE_OTHER): Payer: Medicare Other | Admitting: Physician Assistant

## 2020-05-30 ENCOUNTER — Other Ambulatory Visit: Payer: Self-pay

## 2020-05-30 ENCOUNTER — Encounter: Payer: Self-pay | Admitting: Physician Assistant

## 2020-05-30 VITALS — BP 94/64 | HR 96 | Ht 68.0 in | Wt 157.0 lb

## 2020-05-30 DIAGNOSIS — R3129 Other microscopic hematuria: Secondary | ICD-10-CM | POA: Diagnosis not present

## 2020-05-30 NOTE — Progress Notes (Signed)
In and Out Catheterization  Patient is present today for a I & O catheterization. Patient was cleaned and prepped in a sterile fashion with betadine . A 14FR cath was inserted no complications were noted , 79ml of urine return was noted, urine was yellow in color. A clean urine sample was collected for UA. Bladder was drained  And catheter was removed with out difficulty.    Preformed by: Fonnie Jarvis, CMA

## 2020-05-30 NOTE — Progress Notes (Signed)
05/30/2020 4:25 PM   [name] 01/07/1932 [id number]  CC: Chief Complaint  Patient presents with  . Follow-up    1wk cath UA    HPI: Annasophia Crocker is a 86 y.o. female with PMH OAB on Myrbetriq, dysuria, and vaginal atrophy who presents today for cath UA for reevaluation of microscopic hematuria. I saw her in clinic most recently on 05/21/2020 with reports of dysuria, urgency, frequency, and malodorous urine; UA at the time notable for pyuria and microscopic hematuria with culture finalizing with mixed urogenital flora.   Today she denies dysuria and urgency. She continues to take cranberry supplements and topical vaginal estrogen cream three times weekly.  In-office catheterized UA pan-negative; urine microscopy with cellular casts.  PMH: Past Medical History:  Diagnosis Date  . Benign essential HTN 09/21/2015  . Cancer ([place])    Skin CA resected from Right eyelid and both legs.  . Seizures ([place])   . Thyroid disease     Surgical History: Past Surgical History:  Procedure Laterality Date  . ABDOMINAL HYSTERECTOMY    . BREAST BIOPSY Bilateral    neg    Home Medications:  Allergies as of 05/30/2020      Reactions   Codeine Other (See Comments)   Makes her disoriented   Hydrocodone Other (See Comments)   Patient states she was seeing things that weren't there and very disoriented.   Oxycodone Other (See Comments)   hallucinations   Peanuts [peanut Oil] Other (See Comments)   Violent Migraines   Penicillins Nausea Only   Tramadol Other (See Comments)   Dizziness.      Medication List       Accurate as of May 30, 2020  4:25 PM. If you have any questions, ask your nurse or doctor.        STOP taking these medications   hydrOXYzine 10 MG tablet Commonly known as: ATARAX/VISTARIL Stopped by: Debroah Loop, PA-C   mirabegron ER 25 MG Tb24 tablet Commonly known as: MYRBETRIQ Stopped by: Debroah Loop, PA-C     sertraline 50 MG tablet Commonly known as: ZOLOFT Stopped by: Debroah Loop, PA-C     TAKE these medications   acetaminophen 500 MG tablet Commonly known as: TYLENOL Take 1,000 mg by mouth every 6 (six) hours as needed.   beta carotene w/minerals tablet Take 1 tablet by mouth daily.   diclofenac Sodium 1 % Gel Commonly known as: VOLTAREN Apply 1 application topically 3 times/day as needed-between meals & bedtime.   dicyclomine 10 MG capsule Commonly known as: BENTYL Take 10 mg by mouth 4 (four) times daily -  before meals and at bedtime.   DULoxetine 20 MG capsule Commonly known as: CYMBALTA Take 20 mg by mouth daily.   estradiol 0.1 MG/GM vaginal cream Commonly known as: ESTRACE VAGINAL Apply one pea-sized amount around the opening of the urethra three times weekly.   gabapentin 100 MG capsule Commonly known as: NEURONTIN Take 300 mg by mouth 4 (four) times daily.   lovastatin 20 MG tablet Commonly known as: MEVACOR Take 20 mg by mouth daily. What changed: Another medication with the same name was removed. Continue taking this medication, and follow the directions you see here. Changed by: Debroah Loop, PA-C   Lumigan 0.01 % Soln Generic drug: bimatoprost Place 1 drop into the left eye at bedtime.   melatonin 1 MG Tabs tablet Take 10 mg by mouth once.   montelukast 10 MG tablet Commonly known as: SINGULAIR Take  10 mg by mouth at bedtime.   simvastatin 10 MG tablet Commonly known as: ZOCOR Take 10 mg by mouth at bedtime.   Synthroid 112 MCG tablet Generic drug: levothyroxine Take 1 tablet by mouth daily.       Allergies:  Allergies  Allergen Reactions  . Codeine Other (See Comments)    Makes her disoriented  . Hydrocodone Other (See Comments)    Patient states she was seeing things that weren't there and very disoriented.  . Oxycodone Other (See Comments)    hallucinations  . Peanuts [Peanut Oil] Other (See Comments)    Violent  Migraines  . Penicillins Nausea Only  . Tramadol Other (See Comments)    Dizziness.    Family History: Family History  Problem Relation Age of Onset  . Breast cancer Mother 23  . Breast cancer Maternal Uncle 60  . Breast cancer Maternal Aunt        mat great aunt  . Breast cancer Maternal Aunt 77  . Tracheal cancer Father   . Cancer Sister   . Brain cancer Sister   . Cancer Brother     Social History:   reports that she has never smoked. She has never used smokeless tobacco. She reports that she does not drink alcohol. No history on file for drug use.  Physical Exam: BP 94/64   Pulse 96   Ht 5\' 8"  (1.727 m)   Wt 157 lb (71.2 kg)   BMI 23.87 kg/m   Constitutional:  Alert and oriented, no acute distress, nontoxic appearing HEENT: [place], AT Cardiovascular: No clubbing, cyanosis, or edema Respiratory: Normal respiratory effort, no increased work of breathing Skin: No rashes, bruises or suspicious lesions Neurologic: Grossly intact, no focal deficits, moving all 4 extremities Psychiatric: Normal mood and affect  Laboratory Data: Results for orders placed or performed in visit on 05/30/20  Microscopic Examination   Urine  Result Value Ref Range   WBC, UA 0-5 0 - 5 /hpf   RBC 0-2 0 - 2 /hpf   Epithelial Cells (non renal) 0-10 0 - 10 /hpf   Casts Present (A) None seen /lpf   Cast Type Cellular casts (A) N/A   Bacteria, UA None seen None seen/Few  Urinalysis, Complete  Result Value Ref Range   Specific Gravity, UA 1.020 1.005 - 1.030   pH, UA 6.5 5.0 - 7.5   Color, UA Yellow Yellow   Appearance Ur Clear Clear   Leukocytes,UA Negative Negative   Protein,UA Negative Negative/Trace   Glucose, UA Negative Negative   Ketones, UA Negative Negative   RBC, UA Negative Negative   Bilirubin, UA Negative Negative   Urobilinogen, Ur 1.0 0.2 - 1.0 mg/dL   Nitrite, UA Negative Negative   Microscopic Examination See below:    Assessment & Plan:   1. Microscopic  hematuria Resolved on cath UA today; no further intervention indicated. Recommend cath UAs moving forward as microheme may be contaminant with history of atrophic vaginitis. - Urinalysis, Complete   Return for As scheduled.  Debroah Loop, PA-C  [facility] [address] [place], [place] [address] [phone]

## 2020-05-31 ENCOUNTER — Ambulatory Visit: Payer: Self-pay | Admitting: Physician Assistant

## 2020-06-01 ENCOUNTER — Other Ambulatory Visit: Payer: Self-pay

## 2020-06-01 ENCOUNTER — Ambulatory Visit
Admission: RE | Admit: 2020-06-01 | Discharge: 2020-06-01 | Disposition: A | Discharge status: Home / Self Care | Payer: Medicare Other | Source: Ambulatory Visit | Attending: Family Medicine | Admitting: Family Medicine

## 2020-06-01 DIAGNOSIS — E041 Nontoxic single thyroid nodule: Secondary | ICD-10-CM | POA: Insufficient documentation

## 2020-06-01 LAB — URINALYSIS, COMPLETE
Bilirubin, UA: NEGATIVE
Glucose, UA: NEGATIVE
Ketones, UA: NEGATIVE
Leukocytes,UA: NEGATIVE
Nitrite, UA: NEGATIVE
Protein,UA: NEGATIVE
RBC, UA: NEGATIVE
Specific Gravity, UA: 1.02 (ref 1.005–1.030)
Urobilinogen, Ur: 1 mg/dL (ref 0.2–1.0)
pH, UA: 6.5 (ref 5.0–7.5)

## 2020-06-01 LAB — MICROSCOPIC EXAMINATION: Bacteria, UA: NONE SEEN

## 2020-06-21 ENCOUNTER — Other Ambulatory Visit: Payer: Self-pay

## 2020-06-21 ENCOUNTER — Ambulatory Visit (INDEPENDENT_AMBULATORY_CARE_PROVIDER_SITE_OTHER): Payer: Medicare Other | Admitting: Physician Assistant

## 2020-06-21 VITALS — BP 127/81 | HR 79 | Ht 68.0 in | Wt 161.0 lb

## 2020-06-21 DIAGNOSIS — N390 Urinary tract infection, site not specified: Secondary | ICD-10-CM

## 2020-06-21 DIAGNOSIS — R350 Frequency of micturition: Secondary | ICD-10-CM | POA: Diagnosis not present

## 2020-06-21 LAB — BLADDER SCAN AMB NON-IMAGING

## 2020-06-21 MED ORDER — MIRABEGRON ER 25 MG PO TB24: 25.0000 mg | ORAL_TABLET | Freq: Every day | ORAL | 0 refills | Status: DC

## 2020-06-21 NOTE — Progress Notes (Signed)
06/21/2020 10:57 AM   Sarah [name] [birth year] [id number]  CC: Chief Complaint  Patient presents with  . Follow-up    HPI: Sarah [name] is a 86 y.o. female with PMH OAB previously on trospium, recurrent UTI, and vaginal atrophy who previously failed Premarin who presents today for symptom recheck on Myrbetriq 25 mg.  I saw her in clinic most recently on 05/21/2020 for evaluation of dysuria.  I started her on empiric antibiotics for positive UA, prescribed Estrace, and started her on Myrbetriq 25 mg daily.  Urine culture ultimately resulted negative.  Today she reports using Estrace 3 times weekly as prescribed.  It has not caused the vulvar burning that Premarin previously did.  She does not believe that she has started Myrbetriq.  She continues to report daytime urgency and frequency with standing as well as nocturia x2-3 without urinary leakage.    She has been restricting her fluids after 7 PM and notes improvement in nocturia with this.  She was previously encouraged to drink 68 ounces of water daily.  Otherwise, she drinks 1 cup of coffee in the morning and occasionally has a glass of wine.  No soda intake.    PVR 53mL.  PMH: Past Medical History:  Diagnosis Date  . Benign essential HTN 09/21/2015  . Cancer ([place])    Skin CA resected from Right eyelid and both legs.  . Seizures ([place])   . Thyroid disease     Surgical History: Past Surgical History:  Procedure Laterality Date  . ABDOMINAL HYSTERECTOMY    . BREAST BIOPSY Bilateral    neg    Home Medications:  Allergies as of 06/21/2020      Reactions   Codeine Other (See Comments)   Makes her disoriented   Hydrocodone Other (See Comments)   Patient states she was seeing things that weren't there and very disoriented.   Oxycodone Other (See Comments)   hallucinations   Peanuts [peanut Oil] Other (See Comments)   Violent Migraines   Penicillins Nausea Only   Tramadol Other (See Comments)    Dizziness.      Medication List       Accurate as of June 21, 2020 10:57 AM. If you have any questions, ask your nurse or doctor.        acetaminophen 500 MG tablet Commonly known as: TYLENOL Take 1,000 mg by mouth every 6 (six) hours as needed.   beta carotene w/minerals tablet Take 1 tablet by mouth daily.   diclofenac Sodium 1 % Gel Commonly known as: VOLTAREN Apply 1 application topically 3 times/day as needed-between meals & bedtime.   dicyclomine 10 MG capsule Commonly known as: BENTYL Take 10 mg by mouth 4 (four) times daily -  before meals and at bedtime.   DULoxetine 20 MG capsule Commonly known as: CYMBALTA Take 20 mg by mouth daily.   estradiol 0.1 MG/GM vaginal cream Commonly known as: ESTRACE VAGINAL Apply one pea-sized amount around the opening of the urethra three times weekly.   gabapentin 100 MG capsule Commonly known as: NEURONTIN Take 300 mg by mouth 4 (four) times daily.   lovastatin 20 MG tablet Commonly known as: MEVACOR Take 20 mg by mouth daily.   Lumigan 0.01 % Soln Generic drug: bimatoprost Place 1 drop into the left eye at bedtime.   melatonin 1 MG Tabs tablet Take 10 mg by mouth once.   montelukast 10 MG tablet Commonly known as: SINGULAIR Take 10 mg by mouth at bedtime.  simvastatin 10 MG tablet Commonly known as: ZOCOR Take 10 mg by mouth at bedtime.   Synthroid 112 MCG tablet Generic drug: levothyroxine Take 1 tablet by mouth daily.       Allergies:  Allergies  Allergen Reactions  . Codeine Other (See Comments)    Makes her disoriented  . Hydrocodone Other (See Comments)    Patient states she was seeing things that weren't there and very disoriented.  . Oxycodone Other (See Comments)    hallucinations  . Peanuts [Peanut Oil] Other (See Comments)    Violent Migraines  . Penicillins Nausea Only  . Tramadol Other (See Comments)    Dizziness.    Family History: Family History  Problem Relation Age of  Onset  . Breast cancer Mother 41  . Breast cancer Maternal Uncle 60  . Breast cancer Maternal Aunt        mat great aunt  . Breast cancer Maternal Aunt 64  . Tracheal cancer Father   . Cancer Sister   . Brain cancer Sister   . Cancer Brother     Social History:   reports that she has never smoked. She has never used smokeless tobacco. She reports that she does not drink alcohol. No history on file for drug use.  Physical Exam: BP 127/81 (BP Location: Left Arm, Patient Position: Sitting, Cuff Size: Normal)   Pulse 79   Ht 5\' 8"  (1.727 m)   Wt 161 lb (73 kg)   BMI 24.48 kg/m   Constitutional:  Alert and oriented, no acute distress, nontoxic appearing HEENT: [place], AT Cardiovascular: No clubbing, cyanosis, or edema Respiratory: Normal respiratory effort, no increased work of breathing Skin: No rashes, bruises or suspicious lesions Neurologic: Grossly intact, no focal deficits, moving all 4 extremities Psychiatric: Normal mood and affect  Laboratory Data: Results for orders placed or performed in visit on 06/21/20  Bladder Scan (Post Void Residual) in office  Result Value Ref Range   Scan Result 71mL    Assessment & Plan:   1. Urinary frequency Increased fluid intake is likely contributory, however she is reducing fluids before bedtime with improvement in nocturia.  I offered her samples of Myrbetriq 25 mg today with plans for symptom recheck in 1 month.  She expressed understanding. - Bladder Scan (Post Void Residual) in office - mirabegron ER (MYRBETRIQ) 25 MG TB24 tablet; Take 1 tablet (25 mg total) by mouth daily.  Dispense: 28 tablet; Refill: 0  2. Recurrent UTI Tolerating Estrace.  Okay to continue.  Return in about 4 weeks (around 07/19/2020) for Symptom recheck with PVR.  Debroah Loop, PA-C  [facility] [address] [place], [place] [address] [phone]

## 2020-07-12 ENCOUNTER — Encounter: Payer: Self-pay | Admitting: Student in an Organized Health Care Education/Training Program

## 2020-07-12 ENCOUNTER — Ambulatory Visit
Payer: Medicare Other | Attending: Student in an Organized Health Care Education/Training Program | Admitting: Student in an Organized Health Care Education/Training Program

## 2020-07-12 ENCOUNTER — Other Ambulatory Visit: Payer: Self-pay

## 2020-07-12 VITALS — BP 149/78 | HR 67 | Temp 98.0°F | Resp 18 | Ht 68.0 in | Wt 156.0 lb

## 2020-07-12 DIAGNOSIS — M47818 Spondylosis without myelopathy or radiculopathy, sacral and sacrococcygeal region: Secondary | ICD-10-CM | POA: Diagnosis not present

## 2020-07-12 DIAGNOSIS — G8929 Other chronic pain: Secondary | ICD-10-CM | POA: Insufficient documentation

## 2020-07-12 DIAGNOSIS — M47812 Spondylosis without myelopathy or radiculopathy, cervical region: Secondary | ICD-10-CM | POA: Diagnosis not present

## 2020-07-12 DIAGNOSIS — G894 Chronic pain syndrome: Secondary | ICD-10-CM | POA: Insufficient documentation

## 2020-07-12 DIAGNOSIS — M5416 Radiculopathy, lumbar region: Secondary | ICD-10-CM | POA: Diagnosis not present

## 2020-07-12 DIAGNOSIS — M533 Sacrococcygeal disorders, not elsewhere classified: Secondary | ICD-10-CM | POA: Diagnosis not present

## 2020-07-12 NOTE — Patient Instructions (Addendum)
Epidural Steroid Injection Patient Information  Description: The epidural space surrounds the nerves as they exit the spinal cord.  In some patients, the nerves can be compressed and inflamed by a bulging disc or a tight spinal canal (spinal stenosis).  By injecting steroids into the epidural space, we can bring irritated nerves into direct contact with a potentially helpful medication.  These steroids act directly on the irritated nerves and can reduce swelling and inflammation which often leads to decreased pain.  Epidural steroids may be injected anywhere along the spine and from the neck to the low back depending upon the location of your pain.   After numbing the skin with local anesthetic (like Novocaine), a small needle is passed into the epidural space slowly.  You may experience a sensation of pressure while this is being done.  The entire block usually last less than 10 minutes.  Conditions which may be treated by epidural steroids:   Low back and leg pain  Neck and arm pain  Spinal stenosis  Post-laminectomy syndrome  Herpes zoster (shingles) pain  Pain from compression fractures  Preparation for the injection:  1. Do not eat any solid food or dairy products within 8 hours of your appointment.  2. You may drink clear liquids up to 3 hours before appointment.  Clear liquids include water, black coffee, juice or soda.  No milk or cream please. 3. You may take your regular medication, including pain medications, with a sip of water before your appointment  Diabetics should hold regular insulin (if taken separately) and take 1/2 normal NPH dos the morning of the procedure.  Carry some sugar containing items with you to your appointment. 4. A driver must accompany you and be prepared to drive you home after your procedure.  5. Bring all your current medications with your. 6. An IV may be inserted and sedation may be given at the discretion of the physician.   7. A blood pressure  cuff, EKG and other monitors will often be applied during the procedure.  Some patients may need to have extra oxygen administered for a short period. 8. You will be asked to provide medical information, including your allergies, prior to the procedure.  We must know immediately if you are taking blood thinners (like Coumadin/Warfarin)  Or if you are allergic to IV iodine contrast (dye). We must know if you could possible be pregnant.  Possible side-effects:  Bleeding from needle site  Infection (rare, may require surgery)  Nerve injury (rare)  Numbness & tingling (temporary)  Difficulty urinating (rare, temporary)  Spinal headache ( a headache worse with upright posture)  Light -headedness (temporary)  Pain at injection site (several days)  Decreased blood pressure (temporary)  Weakness in arm/leg (temporary)  Pressure sensation in back/neck (temporary)  Call if you experience:  Fever/chills associated with headache or increased back/neck pain.  Headache worsened by an upright position.  New onset weakness or numbness of an extremity below the injection site  Hives or difficulty breathing (go to the emergency room)  Inflammation or drainage at the infection site  Severe back/neck pain  Any new symptoms which are concerning to you  Please note:  Although the local anesthetic injected can often make your back or neck feel good for several hours after the injection, the pain will likely return.  It takes 3-7 days for steroids to work in the epidural space.  You may not notice any pain relief for at least that one week.    If effective, we will often do a series of three injections spaced 3-6 weeks apart to maximally decrease your pain.  After the initial series, we generally will wait several months before considering a repeat injection of the same type.  If you have any questions, please call [phone] [facility]  ____________________________________________________________________________________________  Preparing for your procedure (without sedation)  Procedure appointments are limited to planned procedures: . No Prescription Refills. . No disability issues will be discussed. . No medication changes will be discussed.  Instructions: . Oral Intake: Do not eat or drink anything for at least 6 hours prior to your procedure. (Exception: Blood Pressure Medication. See below.) . Transportation: Unless otherwise stated by your physician, you may drive yourself after the procedure. . Blood Pressure Medicine: Do not forget to take your blood pressure medicine with a sip of water the morning of the procedure. If your Diastolic (lower reading)is above 100 mmHg, elective cases will be cancelled/rescheduled. . Blood thinners: These will need to be stopped for procedures. Notify our staff if you are taking any blood thinners. Depending on which one you take, there will be specific instructions on how and when to stop it. . Diabetics on insulin: Notify the staff so that you can be scheduled 1st case in the morning. If your diabetes requires high dose insulin, take only  of your normal insulin dose the morning of the procedure and notify the staff that you have done so. . Preventing infections: Shower with an antibacterial soap the morning of your procedure.  . Build-up your immune system: Take 1000 mg of Vitamin C with every meal (3 times a day) the day prior to your procedure. . Antibiotics: Inform the staff if you have a condition or reason that requires you to take antibiotics before dental procedures. . Pregnancy: If you are pregnant, call and cancel the procedure. . Sickness: If you have a cold, fever, or any active infections, call and cancel the procedure. . Arrival: You must be in the facility at least 30 minutes prior to your scheduled procedure. . Children: Do not bring any children with  you. . Dress appropriately: Bring dark clothing that you would not mind if they get stained. . Valuables: Do not bring any jewelry or valuables.  Reasons to call and reschedule or cancel your procedure: (Following these recommendations will minimize the risk of a serious complication.) . Surgeries: Avoid having procedures within 2 weeks of any surgery. (Avoid for 2 weeks before or after any surgery). . Flu Shots: Avoid having procedures within 2 weeks of a flu shots or . (Avoid for 2 weeks before or after immunizations). . Barium: Avoid having a procedure within 7-10 days after having had a radiological study involving the use of radiological contrast. (Myelograms, Barium swallow or enema study). . Heart attacks: Avoid any elective procedures or surgeries for the initial 6 months after a "Myocardial Infarction" (Heart Attack). . Blood thinners: It is imperative that you stop these medications before procedures. Let us know if you if you take any blood thinner.  . Infection: Avoid procedures during or within two weeks of an infection (including chest colds or gastrointestinal problems). Symptoms associated with infections include: Localized redness, fever, chills, night sweats or profuse sweating, burning sensation when voiding, cough, congestion, stuffiness, runny nose, sore throat, diarrhea, nausea, vomiting, cold or Flu symptoms, recent or current infections. It is specially important if the infection is over the area that we intend to treat. .   Heart and lung problems: Symptoms that may suggest an active cardiopulmonary problem include: cough, chest pain, breathing difficulties or shortness of breath, dizziness, ankle swelling, uncontrolled high or unusually low blood pressure, and/or palpitations. If you are experiencing any of these symptoms, cancel your procedure and contact your primary care physician for an evaluation.  Remember:  Regular Business hours are:  Monday to Thursday 8:00 AM to 4:00  PM  Provider's Schedule: Francisco Naveira, MD:  Procedure days: Tuesday and Thursday 7:30 AM to 4:00 PM  Bilal Lateef, MD:  Procedure days: Monday and Wednesday 7:30 AM to 4:00 PM ____________________________________________________________________________________________   

## 2020-07-12 NOTE — Progress Notes (Signed)
PROVIDER NOTE: Information contained herein reflects review and annotations entered in association with encounter. Interpretation of such information and data should be left to medically-trained personnel. Information provided to patient can be located elsewhere in the medical record under "Patient Instructions". Document created using STT-dictation technology, any transcriptional errors that may result from process are unintentional.    Patient: Sarah [name]  Service Category: E/M  Provider: Gillis Santa, MD  DOB: 11-Oct-1931  DOS: 07/12/2020  Specialty: Interventional Pain Management  MRN: [id number]  Setting: Ambulatory outpatient  PCP: Hortencia Pilar, MD  Type: Established Patient    Referring Provider: Hortencia Pilar, MD  Location: Office  Delivery: Face-to-face     HPI  Sarah [name], a 86 y.o. year old female, is here today because of her Lumbar radiculopathy [M54.16]. [name] primary complain today is Follow-up Last encounter: My last encounter with her was on 05/21/2020. Pertinent problems: Sarah [name] has Lumbar facet arthropathy; Lumbar radiculopathy; Lumbar disc herniation (L2/L3); Lumbar degenerative disc disease; Chronic pain syndrome; Neuropathy; and Thyroid nodule on their pertinent problem list. Pain Assessment: Severity of Chronic pain is reported as a 5 /10. Location: Back Mid, Lower/Denies. Onset: More than a month ago. Quality: Constant, Discomfort. Timing: Constant. Modifying factor(s): Sitting,Tylenol. Vitals:  height is 5' 8"  (1.727 m) and weight is 156 lb (70.8 kg). Her temperature is 98 F (36.7 C). Her blood pressure is 149/78 (abnormal) and her pulse is 67. Her respiration is 18 and oxygen saturation is 100%.   Reason for encounter: post-procedure assessment.  Sarah [name] presents today for post procedure evaluation after her cervical facet medial branch nerve blocks which have provided her with pain relief and improvement in functional status.   She states that she has less pain in her neck with lateral movements and is able to perform ADLs with less pain especially those that require neck flexion extension.  She states that she has an upcoming trip to [place] for her grandsons wedding.  She is now endorsing low back pain that radiates into left leg in a dermatomal fashion related to chronic lumbar radiculopathy as well as bilateral sacroiliac joint pain and intermittent groin pain consistent with SI joint arthritis.  Patient's previous lumbar epidural steroid injection and previous bilateral sacroiliac joint injection were over 1 year ago.  She would like to repeat these given increasing her pain and the fact that they helped in the past.   Post-Procedure Evaluation  Procedure (05/21/2020):   Type: Cervical Facet Medial Branch Block(s)  #2  Primary Purpose: Therapeutic Region: Posterolateral cervical spine Level:  C4, C5, C6, & C7 Medial Branch Level(s). Injecting these levels blocks the  C4-5, C5-6, and C6-7 cervical facet joints. Laterality: Bilateral  Sedation: Please see nurses note.  Effectiveness during initial hour after procedure(Ultra-Short Term Relief): 100 %  Local anesthetic used: Long-acting (4-6 hours) Effectiveness: Defined as any analgesic benefit obtained secondary to the administration of local anesthetics. This carries significant diagnostic value as to the etiological location, or anatomical origin, of the pain. Duration of benefit is expected to coincide with the duration of the local anesthetic used.  Effectiveness during initial 4-6 hours after procedure(Short-Term Relief): 100 %   Long-term benefit: Defined as any relief past the pharmacologic duration of the local anesthetics.  Effectiveness past the initial 6 hours after procedure(Long-Term Relief): 100 %   Current benefits: Defined as benefit that persist at this time.   Analgesia:  >50% relief Function: Sarah [name] reports improvement in function ROM: Ms.  Sarah [name] reports improvement in ROM   ROS  Constitutional: Denies any fever or chills Gastrointestinal: No reported hemesis, hematochezia, vomiting, or acute GI distress Musculoskeletal: Low back, bilateral buttock, left greater than right leg pain Neurological: No reported episodes of acute onset apraxia, aphasia, dysarthria, agnosia, amnesia, paralysis, loss of coordination, or loss of consciousness  Medication Review  DULoxetine, acetaminophen, beta carotene w/minerals, bimatoprost, diclofenac Sodium, dicyclomine, estradiol, gabapentin, levothyroxine, lovastatin, melatonin, mirabegron ER, montelukast, and simvastatin  History Review  Allergy: [name] is allergic to codeine, hydrocodone, oxycodone, peanuts [peanut oil], penicillins, and tramadol. Drug: [name]  has no history on file for drug use. Alcohol:  reports no history of alcohol use. Tobacco:  reports that she has never smoked. She has never used smokeless tobacco. Social: [name]  reports that she has never smoked. She has never used smokeless tobacco. She reports that she does not drink alcohol. Medical:  has a past medical history of Benign essential HTN (09/21/2015), Cancer ([place]), Seizures ([place]), and Thyroid disease. Surgical: [name]  has a past surgical history that includes Abdominal hysterectomy and Breast biopsy (Bilateral). Family: family history includes Brain cancer in her sister; Breast cancer in her maternal aunt; Breast cancer (age of onset: 85) in her maternal aunt; Breast cancer (age of onset: 44) in her mother; Breast cancer (age of onset: 47) in her maternal uncle; Cancer in her brother and sister; Tracheal cancer in her father.  Laboratory Chemistry Profile   Renal Lab Results  Component Value Date   BUN 23 (H) 09/14/2015   CREATININE 0.80 09/14/2015   GFRAA >60 09/14/2015   GFRNONAA >60 09/14/2015     Hepatic Lab Results  Component Value Date   AST 19 11/26/2015   ALT 15 11/26/2015    ALBUMIN 4.7 11/26/2015   ALKPHOS 64 11/26/2015     Electrolytes Lab Results  Component Value Date   NA 141 09/14/2015   K 3.5 09/14/2015   CL 107 09/14/2015   CALCIUM 9.7 09/14/2015   MG 2.2 09/14/2015     Bone No results found for: VD25OH, VD125OH2TOT, [id number], MB8675QG9, 25OHVITD1, 25OHVITD2, 25OHVITD3, TESTOFREE, TESTOSTERONE   Inflammation (CRP: Acute Phase) (ESR: Chronic Phase) No results found for: CRP, ESRSEDRATE, LATICACIDVEN     Note: Above Lab results reviewed.  Recent Imaging Review  US THYROID CLINICAL DATA:  Prior ultrasound follow-up.  EXAM: THYROID ULTRASOUND  TECHNIQUE: Ultrasound examination of the thyroid gland and adjacent soft tissues was performed.  COMPARISON:  05/11/2018  FINDINGS: Parenchymal Echotexture: Mildly heterogenous  Isthmus: 0.2 cm  Right lobe: 2.8 x 0.8 x 1.0 cm  Left lobe: 2.3 x 0.6 x 0.9 cm  _________________________________________________________  Estimated total number of nodules >/= 1 cm: 2  Number of spongiform nodules >/=  2 cm not described below (TR1): 0  Number of mixed cystic and solid nodules >/= 1.5 cm not described below ([place]): 0  _________________________________________________________  Nodule # 2:  Prior biopsy: No  Location: Right; Inferior  Maximum size: 1.3 cm; Other 2 dimensions: 0.9 x 0.7 cm, previously, 1.3 cm  Composition: solid/almost completely solid (2)  Echogenicity: hypoechoic (2)  Shape: not taller-than-wide (0)  Margins: ill-defined (0)  Echogenic foci: macrocalcifications (1)  ACR TI-RADS total points: 5.  ACR TI-RADS risk category:  TR4 (4-6 points).  Significant change in size (>/= 20% in two dimensions and minimal increase of 2 mm): No  Change in features: No  Change in ACR TI-RADS risk category: No  ACR TI-RADS recommendations:  This nodule appears stable. Consider additional 1 year  follow-up ultrasound.  _________________________________________________________  Prior imaged isthmus nodule measuring 1 cm is stable and did not previously meet criteria for dedicated follow-up. No abnormal lymph nodes identified.  IMPRESSION: Stable 1.3 cm right inferior thyroid nodule. Consider additional 1 year follow-up ultrasound.  The above is in keeping with the ACR TI-RADS recommendations - J Am Coll Radiol 2017;14:587-595.  Electronically Signed   By: Aletta Edouard M.D.   On: 06/02/2020 12:42 Note: Reviewed        Physical Exam  General appearance: Well nourished, well developed, and well hydrated. In no apparent acute distress Mental status: Alert, oriented x 3 (person, place, & time)       Respiratory: No evidence of acute respiratory distress Eyes: PERLA Vitals: BP (!) 149/78   Pulse 67   Temp 98 F (36.7 C)   Resp 18   Ht 5' 8"  (1.727 m)   Wt 156 lb (70.8 kg)   SpO2 100%   BMI 23.72 kg/m  BMI: Estimated body mass index is 23.72 kg/m as calculated from the following:   Height as of this encounter: 5' 8"  (1.727 m).   Weight as of this encounter: 156 lb (70.8 kg). Ideal: Ideal body weight: 63.9 kg (140 lb 14 oz) Adjusted ideal body weight: 66.6 kg (146 lb 14.8 oz)   Cervical Spine Area Exam  Skin & Axial Inspection: No masses, redness, edema, swelling, or associated skin lesions Alignment: Symmetrical Functional ROM: Improved after treatment      Stability: No instability detected Muscle Tone/Strength: Functionally intact. No obvious neuro-muscular anomalies detected. Sensory (Neurological): Improved Palpation: No palpable anomalies             Lumbar Spine Area Exam  Skin & Axial Inspection: No masses, redness, or swelling Alignment: Symmetrical Functional ROM: Pain restricted ROM       Stability: No instability detected Muscle Tone/Strength: Functionally intact. No obvious neuro-muscular anomalies detected. Sensory (Neurological): Dermatomal  pain pattern Palpation: No palpable anomalies       Provocative Tests: Hyperextension/rotation test: (+) due to pain. Lumbar quadrant test (Kemp's test): (+) on the left for foraminal stenosis Lateral bending test: deferred today       Patrick's Maneuver: (+) for bilateral S-I arthralgia             FABER* test: (+) for bilateral S-I arthralgia             S-I anterior distraction/compression test: (+)   S-I arthralgia/arthropathy S-I lateral compression test: (+)   S-I arthralgia/arthropathy S-I Thigh-thrust test: deferred today         S-I Gaenslen's test: deferred today         *(Flexion, ABduction and External Rotation) Gait & Posture Assessment  Ambulation: Unassisted Gait: Relatively normal for age and body habitus Posture: Difficulty standing up straight, due to pain  Lower Extremity Exam    Side: Right lower extremity  Side: Left lower extremity  Stability: No instability observed          Stability: No instability observed          Skin & Extremity Inspection: Skin color, temperature, and hair growth are WNL. No peripheral edema or cyanosis. No masses, redness, swelling, asymmetry, or associated skin lesions. No contractures.  Skin & Extremity Inspection: Skin color, temperature, and hair growth are WNL. No peripheral edema or cyanosis. No masses, redness, swelling, asymmetry, or associated skin lesions. No contractures.  Functional ROM: Pain restricted ROM  for hip and knee joints          Functional ROM: Pain restricted ROM for hip and knee joints          Muscle Tone/Strength: Functionally intact. No obvious neuro-muscular anomalies detected.  Muscle Tone/Strength: Functionally intact. No obvious neuro-muscular anomalies detected.  Sensory (Neurological): Unimpaired        Sensory (Neurological): Unimpaired        DTR: Patellar: deferred today Achilles: deferred today Plantar: deferred today  DTR: Patellar: deferred today Achilles: deferred today Plantar: deferred today   Palpation: No palpable anomalies  Palpation: No palpable anomalies    Assessment   Status Diagnosis  Having a Flare-up Having a Flare-up Having a Flare-up 1. Lumbar radiculopathy   2. SI joint arthritis   3. Chronic SI joint pain   4. Cervical spondylosis   5. Cervical facet joint syndrome   6. Chronic pain syndrome        Plan of Care    1.  Lumbar radiculopathy: Plan for lumbar epidural steroid injection.  Previously done 10/13/2018 on the left side at L2-L3 which provided pain relief. 2.  SI joint arthritis, SI joint pain flare: Clinical exam findings consistent with SI joint arthropathy, plan for SI joint injection, last injection July 27, 2019.  Did find it beneficial and improved her functional status. 3.  Satisfactory pain relief after cervical facet medial branch nerve blocks.  Can consider cervical facet medial branch RFA in future.   Orders:  Orders Placed This Encounter  Procedures  . Lumbar Epidural Injection    Standing Status:   Future    Standing Expiration Date:   08/12/2020    Scheduling Instructions:     Procedure: Interlaminar Lumbar Epidural Steroid injection (LESI)            Laterality: Midline     Sedation: without     Timeframe: ASAA    Order Specific Question:   Where will this procedure be performed?    Answer:   [facility]  . SACROILIAC JOINT INJECTION    Standing Status:   Future    Standing Expiration Date:   08/12/2020    Scheduling Instructions:     Side: Bilateral     Sedation:without      Timeframe: ASAP    Order Specific Question:   Where will this procedure be performed?    Answer:   [facility]   Follow-up plan:   Return in about 2 weeks (around 07/26/2020) for L-ESI+ B/L SI-J , without sedation.     Finds benefit with bilateral sacroiliac joint injection, previous one 03/14/2019 and then second 1 05/25/2019.  Also finds lumbar epidural steroid injections for when she has sciatic flare, consider repeating.   Previous one performed on 10/13/2018 at L2-L3.  Status post bilateral C4, C5, C6, C7 cervical facet medial branch nerve block on 03/05/2020, 05/21/20 for cervical facet arthropathy.      Recent Visits Date Type Provider Dept  05/21/20 Procedure visit Gillis Santa, MD [facility]  Showing recent visits within past 90 days and meeting all other requirements Today's Visits Date Type Provider Dept  07/12/20 Office Visit Gillis Santa, MD [facility]  Showing today's visits and meeting all other requirements Future Appointments No visits were found meeting these conditions. Showing future appointments within next 90 days and meeting all other requirements  I discussed the assessment and treatment plan with the patient. The patient was provided an opportunity to ask  questions and all were answered. The patient agreed with the plan and demonstrated an understanding of the instructions.  Patient advised to call back or seek an in-person evaluation if the symptoms or condition worsens.  Duration of encounter32mnutes.  Note by: BGillis Santa MD Date: 07/12/2020; Time: 8:27 AM

## 2020-07-18 NOTE — Progress Notes (Signed)
07/19/2020 1:52 PM   Sarah [name] 1931/10/04 [id number]  CC: Chief Complaint  Patient presents with  . Over Active Bladder    HPI: Sarah [name] is a 86 y.o. female with PMH OAB previously on trospium, recurrent UTI and vaginal atrophy who presents today for symptom recheck on Myrbetriq 25 mg.   At her visit with Thomas Hoff, PA-C on June 21, 2020, she was given Myrbetriq 25 mg daily samples and also switch from her Premarin cream to Estrace cream.  The patient is  experiencing urgency x 0-3, frequency x 4-7, not restricting fluids to avoid visits to the restroom, not engaging in toilet mapping, incontinence x 0-3 and nocturia x 0-3.   Her BP is 112/70.   Her PVR is 0 mL    Patient denies any modifying or aggravating factors.  Patient denies any gross hematuria, dysuria or suprapubic/flank pain.  Patient denies any fevers, chills, nausea or vomiting.   She states the Myrbetriq 25 mg have helped with her OAB particularly with her daytime and nighttime frequency.  She would like to continue the medication.    PMH: Past Medical History:  Diagnosis Date  . Benign essential HTN 09/21/2015  . Cancer ([place])    Skin CA resected from Right eyelid and both legs.  . Seizures ([place])   . Thyroid disease     Surgical History: Past Surgical History:  Procedure Laterality Date  . ABDOMINAL HYSTERECTOMY    . BREAST BIOPSY Bilateral    neg    Home Medications:  Allergies as of 07/19/2020      Reactions   Codeine Other (See Comments)   Makes her disoriented   Hydrocodone Other (See Comments)   Patient states she was seeing things that weren't there and very disoriented.   Oxycodone Other (See Comments)   hallucinations   Peanuts [peanut Oil] Other (See Comments)   Violent Migraines   Penicillins Nausea Only   Tramadol Other (See Comments)   Dizziness.      Medication List       Accurate as of July 19, 2020  1:52 PM. If you have any  questions, ask your nurse or doctor.        STOP taking these medications   diclofenac Sodium 1 % Gel Commonly known as: VOLTAREN Stopped by: Flynn Lininger, PA-C     TAKE these medications   acetaminophen 500 MG tablet Commonly known as: TYLENOL Take 1,000 mg by mouth every 6 (six) hours as needed.   beta carotene w/minerals tablet Take 1 tablet by mouth daily.   dicyclomine 10 MG capsule Commonly known as: BENTYL Take 10 mg by mouth 4 (four) times daily -  before meals and at bedtime.   DULoxetine 20 MG capsule Commonly known as: CYMBALTA Take 20 mg by mouth daily.   estradiol 0.1 MG/GM vaginal cream Commonly known as: ESTRACE VAGINAL Apply one pea-sized amount around the opening of the urethra three times weekly.   gabapentin 100 MG capsule Commonly known as: NEURONTIN Take 300 mg by mouth 4 (four) times daily.   gabapentin 300 MG capsule Commonly known as: NEURONTIN Take by mouth.   lovastatin 20 MG tablet Commonly known as: MEVACOR Take 20 mg by mouth daily.   Lumigan 0.01 % Soln Generic drug: bimatoprost Place 1 drop into the left eye at bedtime.   melatonin 1 MG Tabs tablet Take 10 mg by mouth once.   mirabegron ER 25 MG Tb24 tablet Commonly known as: MYRBETRIQ  Take 1 tablet (25 mg total) by mouth daily. Started by: Zara Council, PA-C   montelukast 10 MG tablet Commonly known as: SINGULAIR Take 10 mg by mouth at bedtime.   simvastatin 10 MG tablet Commonly known as: ZOCOR Take 10 mg by mouth at bedtime.   Synthroid 112 MCG tablet Generic drug: levothyroxine Take 1 tablet by mouth daily.       Allergies:  Allergies  Allergen Reactions  . Codeine Other (See Comments)    Makes her disoriented  . Hydrocodone Other (See Comments)    Patient states she was seeing things that weren't there and very disoriented.  . Oxycodone Other (See Comments)    hallucinations  . Peanuts [Peanut Oil] Other (See Comments)    Violent Migraines  .  Penicillins Nausea Only  . Tramadol Other (See Comments)    Dizziness.    Family History: Family History  Problem Relation Age of Onset  . Breast cancer Mother 60  . Breast cancer Maternal Uncle 60  . Breast cancer Maternal Aunt        mat great aunt  . Breast cancer Maternal Aunt 20  . Tracheal cancer Father   . Cancer Sister   . Brain cancer Sister   . Cancer Brother     Social History:   reports that she has never smoked. She has never used smokeless tobacco. She reports that she does not drink alcohol. No history on file for drug use.  Physical Exam: BP 112/70   Pulse 83   Ht _0  (1.727 m)   Wt 156 lb (70.8 kg)   BMI 23.72 kg/m   Constitutional:  Well nourished. Alert and oriented, No acute distress. HEENT: [place] AT, mask in place.  Trachea midline Cardiovascular: No clubbing, cyanosis, or edema. Respiratory: Normal respiratory effort, no increased work of breathing. Neurologic: Grossly intact, no focal deficits, moving all 4 extremities. Psychiatric: Normal mood and affect.   Laboratory Data: Specimen:  Blood  Ref Range & Units 2 mo ago Comments  Sodium 135 - 145 mmol/L 138        Potassium 3.5 - 5.0 mmol/L 3.9        Chloride 98 - 108 mmol/L 100        Carbon Dioxide (CO2) 21 - 30 mmol/L 26        Urea Nitrogen (BUN) 7 - 20 mg/dL 9        Creatinine 0.4 - 1.0 mg/dL 0.9        Glucose 70 - 140 mg/dL 103  Interpretive Data:  Above is the NONFASTING reference range.   Below are the FASTING reference ranges:  NORMAL:   70-99 mg/dL  PREDIABETES: 100-125 mg/dL  DIABETES:  > 125 mg/dL       Calcium 8.7 - 10.2 mg/dL 10.0        Anion Gap 3 - 12 mmol/L 12        BUN/CREA Ratio 6 - 27  10        Glomerular Filtration Rate (eGFR)  mL/min/1.73sq m 58   NON-Modified eGFR: 58 mL/min/1.73sq m .  We recommend using this value for referral decisions.   I have reviewed the labs.  Assessment & Plan:    1. Urinary frequency At goal with Myrbetriq 25  mg daily Prescription is sent to her pharmacy Otherwise see her in 6 months for OAB questionnaire and PVR  2. Vaginal atrophy Tolerating Estrace.  Okay to continue.  Return in about 6 months (  around 01/17/2021) for PVR and OAB questionnaire.  Zara Council, PA-C  [facility] [address], [place], [place] [address] [phone]

## 2020-07-19 ENCOUNTER — Ambulatory Visit (INDEPENDENT_AMBULATORY_CARE_PROVIDER_SITE_OTHER): Payer: Medicare Other | Admitting: Urology

## 2020-07-19 ENCOUNTER — Encounter: Payer: Self-pay | Admitting: Urology

## 2020-07-19 ENCOUNTER — Other Ambulatory Visit: Payer: Self-pay

## 2020-07-19 VITALS — BP 112/70 | HR 83 | Ht 68.0 in | Wt 156.0 lb

## 2020-07-19 DIAGNOSIS — N952 Postmenopausal atrophic vaginitis: Secondary | ICD-10-CM

## 2020-07-19 DIAGNOSIS — R350 Frequency of micturition: Secondary | ICD-10-CM

## 2020-07-19 LAB — BLADDER SCAN AMB NON-IMAGING: Scan Result: 0

## 2020-07-19 MED ORDER — MIRABEGRON ER 25 MG PO TB24: 25.0000 mg | ORAL_TABLET | Freq: Every day | ORAL | 3 refills | Status: DC

## 2020-07-30 ENCOUNTER — Encounter: Payer: Self-pay | Admitting: Student in an Organized Health Care Education/Training Program

## 2020-07-30 ENCOUNTER — Ambulatory Visit
Admission: RE | Admit: 2020-07-30 | Discharge: 2020-07-30 | Disposition: A | Discharge status: Home / Self Care | Payer: Medicare Other | Source: Ambulatory Visit | Attending: Student in an Organized Health Care Education/Training Program | Admitting: Student in an Organized Health Care Education/Training Program

## 2020-07-30 ENCOUNTER — Telehealth: Payer: Self-pay | Admitting: Physician Assistant

## 2020-07-30 ENCOUNTER — Ambulatory Visit (HOSPITAL_BASED_OUTPATIENT_CLINIC_OR_DEPARTMENT_OTHER): Payer: Medicare Other | Admitting: Student in an Organized Health Care Education/Training Program

## 2020-07-30 ENCOUNTER — Other Ambulatory Visit: Payer: Self-pay

## 2020-07-30 VITALS — BP 136/89 | HR 75 | Temp 97.4°F | Resp 16 | Ht 68.0 in | Wt 155.0 lb

## 2020-07-30 DIAGNOSIS — G894 Chronic pain syndrome: Secondary | ICD-10-CM | POA: Insufficient documentation

## 2020-07-30 DIAGNOSIS — G8929 Other chronic pain: Secondary | ICD-10-CM | POA: Diagnosis not present

## 2020-07-30 DIAGNOSIS — M5416 Radiculopathy, lumbar region: Secondary | ICD-10-CM | POA: Insufficient documentation

## 2020-07-30 DIAGNOSIS — M533 Sacrococcygeal disorders, not elsewhere classified: Secondary | ICD-10-CM

## 2020-07-30 DIAGNOSIS — M5481 Occipital neuralgia: Secondary | ICD-10-CM | POA: Insufficient documentation

## 2020-07-30 DIAGNOSIS — Z885 Allergy status to narcotic agent status: Secondary | ICD-10-CM | POA: Diagnosis not present

## 2020-07-30 DIAGNOSIS — M47818 Spondylosis without myelopathy or radiculopathy, sacral and sacrococcygeal region: Secondary | ICD-10-CM | POA: Diagnosis not present

## 2020-07-30 DIAGNOSIS — Z888 Allergy status to other drugs, medicaments and biological substances status: Secondary | ICD-10-CM | POA: Diagnosis not present

## 2020-07-30 DIAGNOSIS — Z880 Allergy status to penicillin: Secondary | ICD-10-CM | POA: Diagnosis not present

## 2020-07-30 MED ORDER — ROPIVACAINE HCL 2 MG/ML IJ SOLN
INTRAMUSCULAR | Status: AC
  Filled 2020-07-30: qty 10

## 2020-07-30 MED ORDER — LIDOCAINE HCL 2 % IJ SOLN
INTRAMUSCULAR | Status: AC
  Filled 2020-07-30: qty 20

## 2020-07-30 MED ORDER — ROPIVACAINE HCL 2 MG/ML IJ SOLN
9.0000 mL | Freq: Once | INTRAMUSCULAR | Status: AC
  Administered 2020-07-30: 9 mL via INTRA_ARTICULAR

## 2020-07-30 MED ORDER — ROPIVACAINE HCL 2 MG/ML IJ SOLN
9.0000 mL | Freq: Once | INTRAMUSCULAR | Status: AC
  Administered 2020-07-30: 9 mL via PERINEURAL

## 2020-07-30 MED ORDER — DEXAMETHASONE SODIUM PHOSPHATE 10 MG/ML IJ SOLN
10.0000 mg | Freq: Once | INTRAMUSCULAR | Status: AC
  Administered 2020-07-30: 10 mg

## 2020-07-30 MED ORDER — SODIUM CHLORIDE (PF) 0.9 % IJ SOLN
INTRAMUSCULAR | Status: AC
  Filled 2020-07-30: qty 10

## 2020-07-30 MED ORDER — METHYLPREDNISOLONE ACETATE 40 MG/ML IJ SUSP
40.0000 mg | Freq: Once | INTRAMUSCULAR | Status: AC
  Administered 2020-07-30: 40 mg via INTRA_ARTICULAR

## 2020-07-30 MED ORDER — IOHEXOL 180 MG/ML  SOLN
10.0000 mL | Freq: Once | INTRAMUSCULAR | Status: AC
  Administered 2020-07-30: 9 mL via EPIDURAL

## 2020-07-30 MED ORDER — METHYLPREDNISOLONE ACETATE 40 MG/ML IJ SUSP
INTRAMUSCULAR | Status: AC
  Filled 2020-07-30: qty 1

## 2020-07-30 MED ORDER — DEXAMETHASONE SODIUM PHOSPHATE 10 MG/ML IJ SOLN
INTRAMUSCULAR | Status: AC
  Filled 2020-07-30: qty 1

## 2020-07-30 NOTE — Progress Notes (Signed)
Patient's Name: Sarah [name]  MRN: [id number]  Referring Provider: Hortencia Pilar, MD  DOB: 03/07/1932  PCP: Sharyne Peach, MD  DOS: 07/30/2020  Note by: Gillis Santa, MD  Service setting: Ambulatory outpatient  Specialty: Interventional Pain Management  Patient type: Established  Location: [facility] (AMB) Pain Management Facility  Visit type: Interventional Procedure   Primary Reason for Visit: Interventional Pain Management Treatment. CC: Back Pain (lumbar bilateral )  Procedure:          Anesthesia, Analgesia, Anxiolysis:  Type: Therapeutic Sacroiliac Joint Steroid Injection #4 previously done 07/2019 Region: Inferior Lumbosacral Region Level: PIIS (Posterior Inferior Iliac Spine) Laterality: Bilateral    Local Anesthetic: Lidocaine 1-2%  Position: Prone           Indications: Bilateral SI joint pain, SI joint arthropathy.  Pain Score: Pre-procedure: 4 /10 Post-procedure: 4 /10  Pre-op Assessment:  [name] is a 86 y.o. (year old), female patient, seen today for interventional treatment. She  has a past surgical history that includes Abdominal hysterectomy and Breast biopsy (Bilateral). Ms. Janosik has a current medication list which includes the following prescription(s): acetaminophen, beta carotene w/minerals, dicyclomine, duloxetine, estradiol, gabapentin, gabapentin, lovastatin, lumigan, melatonin, mirabegron er, montelukast, simvastatin, and synthroid. Her primarily concern today is the Back Pain (lumbar bilateral )  Initial Vital Signs:  Pulse/HCG Rate: 76  Temp: (!) 97.4 F (36.3 C) Resp: 16 BP: 123/89 SpO2: 94 %  BMI: Estimated body mass index is 23.57 kg/m as calculated from the following:   Height as of this encounter: 5\' 8"  (1.727 m).   Weight as of this encounter: 155 lb (70.3 kg).  Risk Assessment: Allergies: Reviewed. She is allergic to codeine, hydrocodone, oxycodone, peanuts [peanut oil], penicillins, and tramadol.  Allergy Precautions: None  required Coagulopathies: Reviewed. None identified.  Blood-thinner therapy: None at this time Active Infection(s): Reviewed. None identified. [name] is afebrile  Site Confirmation: [name] was asked to confirm the procedure and laterality before marking the site Procedure checklist: Completed Consent: Before the procedure and under the influence of no sedative(s), amnesic(s), or anxiolytics, the patient was informed of the treatment options, risks and possible complications. To fulfill our ethical and legal obligations, as recommended by the American Medical Association's Code of Ethics, I have informed the patient of my clinical impression; the nature and purpose of the treatment or procedure; the risks, benefits, and possible complications of the intervention; the alternatives, including doing nothing; the risk(s) and benefit(s) of the alternative treatment(s) or procedure(s); and the risk(s) and benefit(s) of doing nothing. The patient was provided information about the general risks and possible complications associated with the procedure. These may include, but are not limited to: failure to achieve desired goals, infection, bleeding, organ or nerve damage, allergic reactions, paralysis, and death. In addition, the patient was informed of those risks and complications associated to the procedure, such as failure to decrease pain; infection; bleeding; organ or nerve damage with subsequent damage to sensory, motor, and/or autonomic systems, resulting in permanent pain, numbness, and/or weakness of one or several areas of the body; allergic reactions; (i.e.: anaphylactic reaction); and/or death. Furthermore, the patient was informed of those risks and complications associated with the medications. These include, but are not limited to: allergic reactions (i.e.: anaphylactic or anaphylactoid reaction(s)); adrenal axis suppression; blood sugar elevation that in diabetics may result in ketoacidosis  or comma; water retention that in patients with history of congestive heart failure may result in shortness of breath, pulmonary edema, and  decompensation with resultant heart failure; weight gain; swelling or edema; medication-induced neural toxicity; particulate matter embolism and blood vessel occlusion with resultant organ, and/or nervous system infarction; and/or aseptic necrosis of one or more joints. Finally, the patient was informed that Medicine is not an exact science; therefore, there is also the possibility of unforeseen or unpredictable risks and/or possible complications that may result in a catastrophic outcome. The patient indicated having understood very clearly. We have given the patient no guarantees and we have made no promises. Enough time was given to the patient to ask questions, all of which were answered to the patient's satisfaction. [name] has indicated that she wanted to continue with the procedure. Attestation: I, the ordering provider, attest that I have discussed with the patient the benefits, risks, side-effects, alternatives, likelihood of achieving goals, and potential problems during recovery for the procedure that I have provided informed consent. Date  Time: 07/30/2020  8:58 AM  Pre-Procedure Preparation:  Monitoring: As per clinic protocol. Respiration, ETCO2, SpO2, BP, heart rate and rhythm monitor placed and checked for adequate function Safety Precautions: Patient was assessed for positional comfort and pressure points before starting the procedure. Time-out: I initiated and conducted the "Time-out" before starting the procedure, as per protocol. The patient was asked to participate by confirming the accuracy of the "Time Out" information. Verification of the correct person, site, and procedure were performed and confirmed by me, the nursing staff, and the patient. "Time-out" conducted as per Joint Commission's Universal Protocol (UP.01.01.01). Time:  0930  Description of Procedure:          Target Area: Inferior, posterior, aspect of the sacroiliac fissure Approach: Posterior, paraspinal, ipsilateral approach. Area Prepped: Entire Lower Lumbosacral Region Prepping solution: ChloraPrep (2% chlorhexidine gluconate and 70% isopropyl alcohol) Safety Precautions: Aspiration looking for blood return was conducted prior to all injections. At no point did we inject any substances, as a needle was being advanced. No attempts were made at seeking any paresthesias. Safe injection practices and needle disposal techniques used. Medications properly checked for expiration dates. SDV (single dose vial) medications used. Description of the Procedure: Protocol guidelines were followed. The patient was placed in position over the procedure table. The target area was identified and the area prepped in the usual manner. Skin & deeper tissues infiltrated with local anesthetic. Appropriate amount of time allowed to pass for local anesthetics to take effect. The procedure needle was advanced under fluoroscopic guidance into the sacroiliac joint until a firm endpoint was obtained. Proper needle placement secured. Negative aspiration confirmed. Solution injected in intermittent fashion, asking for systemic symptoms every 0.5cc of injectate. The needles were then removed and the area cleansed, making sure to leave some of the prepping solution back to take advantage of its long term bactericidal properties. Vitals:   07/30/20 0916 07/30/20 0925 07/30/20 0935 07/30/20 0945  BP: 123/89 (!) 132/94 129/84 136/89  Pulse: 76 78 75 75  Resp: 16 12 15 16   Temp: (!) 97.4 F (36.3 C)     TempSrc: Temporal     SpO2: 94% 100% 99% 100%  Weight: 155 lb (70.3 kg)     Height: 5\' 8"  (1.727 m)       Start Time: 0931 hrs. End Time: 0944 hrs. Materials:  Needle(s) Type: Spinal Needle Gauge: 25G Length: 3.5-in Medication(s): Please see orders for medications and dosing details. 5  cc solution made of 4cc of 0.2% ropivacaine, 1 cc of methylprednisolone, 40 mg/cc. 2.5 cc injected intra-articular, 2.5 cc  injected periarticular for left sacroiliac joint 5 cc solution made of 4cc of 0.2% ropivacaine, 1 cc of methylprednisolone, 40 mg/cc. 2.5 cc injected intra-articular, 2.5 cc injected periarticular for right sacroiliac joint Total steroid dose: 10 mg Decadron Imaging Guidance (Non-Spinal):          Type of Imaging Technique: Fluoroscopy Guidance (Non-Spinal) Indication(s): Assistance in needle guidance and placement for procedures requiring needle placement in or near specific anatomical locations not easily accessible without such assistance. Exposure Time: Please see nurses notes. Contrast: Before injecting any contrast, we confirmed that the patient did not have an allergy to iodine, shellfish, or radiological contrast. Once satisfactory needle placement was completed at the desired level, radiological contrast was injected. Contrast injected under live fluoroscopy. No contrast complications. See chart for type and volume of contrast used. Fluoroscopic Guidance: I was personally present during the use of fluoroscopy. "Tunnel Vision Technique" used to obtain the best possible view of the target area. Parallax error corrected before commencing the procedure. "Direction-depth-direction" technique used to introduce the needle under continuous pulsed fluoroscopy. Once target was reached, antero-posterior, oblique, and lateral fluoroscopic projection used confirm needle placement in all planes. Images permanently stored in EMR. Interpretation: I personally interpreted the imaging intraoperatively. Adequate needle placement confirmed in multiple planes. Appropriate spread of contrast into desired area was observed. No evidence of afferent or efferent intravascular uptake. Permanent images saved into the patient's record.  Antibiotic Prophylaxis:   Anti-infectives (From admission, onward)    None     Indication(s): None identified  Post-operative Assessment:  Post-procedure Vital Signs:  Pulse/HCG Rate: 75  Temp: (!) 97.4 F (36.3 C) Resp: 16 BP: 136/89 SpO2: 100 %  EBL: None  Complications: No immediate post-treatment complications observed by team, or reported by patient.  Note: The patient tolerated the entire procedure well. A repeat set of vitals were taken after the procedure and the patient was kept under observation following institutional policy, for this type of procedure. Post-procedural neurological assessment was performed, showing return to baseline, prior to discharge. The patient was provided with post-procedure discharge instructions, including a section on how to identify potential problems. Should any problems arise concerning this procedure, the patient was given instructions to immediately contact us, at any time, without hesitation. In any case, we plan to contact the patient by telephone for a follow-up status report regarding this interventional procedure.  Comments:  No additional relevant information.  Plan of Care  Orders:  Orders Placed This Encounter  Procedures  . GREATER OCCIPITAL NERVE BLOCK    Standing Status:   Future    Standing Expiration Date:   10/30/2020    Scheduling Instructions:     Procedure: Occipital nerve block     Laterality: Bilateral     Sedation: without     Timeframe: ASAA    Order Specific Question:   Where will this procedure be performed?    Answer:   [facility]  . DG [facility] C-ARM 1-60 MIN NO REPORT    Intraoperative interpretation by procedural physician at [facility].    Standing Status:   Standing    Number of Occurrences:   1    Order Specific Question:   Reason for exam:    Answer:   Assistance in needle guidance and placement for procedures requiring needle placement in or near specific anatomical locations not easily accessible without such assistance.   Medications ordered  for procedure: Meds ordered this encounter  Medications  . iohexol (  OMNIPAQUE) 180 MG/ML injection 10 mL    Must be Myelogram-compatible. If not available, you may substitute with a water-soluble, non-ionic, hypoallergenic, myelogram-compatible radiological contrast medium.  . ropivacaine (PF) 2 mg/mL (0.2%) (NAROPIN) injection 9 mL  . dexamethasone (DECADRON) injection 10 mg  . ropivacaine (PF) 2 mg/mL (0.2%) (NAROPIN) injection 9 mL  . methylPREDNISolone acetate (DEPO-MEDROL) injection 40 mg  . methylPREDNISolone acetate (DEPO-MEDROL) injection 40 mg   Medications administered: We administered iohexol, ropivacaine (PF) 2 mg/mL (0.2%), dexamethasone, ropivacaine (PF) 2 mg/mL (0.2%), methylPREDNISolone acetate, and methylPREDNISolone acetate.  See the medical record for exact dosing, route, and time of administration.  Disposition: Discharge home  Discharge Date & Time: 07/30/2020; 0951 hrs.   Follow-up plan:   Return in about 9 days (around 08/08/2020) for B/L Occipital NB.     Future Appointments  Date Time Provider [place]  08/13/2020  9:00 AM Gillis Santa, MD [facility] None  01/02/2021 10:00 AM McGowan, Marlowe Kays None   Primary Care Physician: Sharyne Peach, MD Location: [facility] Note by: Gillis Santa, MD Date: 07/30/2020; Time: 10:14 AM  Disclaimer:  Medicine is not an exact science. The only guarantee in medicine is that nothing is guaranteed. It is important to note that the decision to proceed with this intervention was based on the information collected from the patient. The Data and conclusions were drawn from the patient's questionnaire, the interview, and the physical examination. Because the information was provided in large part by the patient, it cannot be guaranteed that it has not been purposely or unconsciously manipulated. Every effort has been made to obtain as much relevant data as possible for this  evaluation. It is important to note that the conclusions that lead to this procedure are derived in large part from the available data. Always take into account that the treatment will also be dependent on availability of resources and existing treatment guidelines, considered by other Pain Management Practitioners as being common knowledge and practice, at the time of the intervention. For Medico-Legal purposes, it is also important to point out that variation in procedural techniques and pharmacological choices are the acceptable norm. The indications, contraindications, technique, and results of the above procedure should only be interpreted and judged by a Board-Certified Interventional Pain Specialist with extensive familiarity and expertise in the same exact procedure and technique.

## 2020-07-30 NOTE — Progress Notes (Signed)
Patient's Name: Sarah [name]  MRN: [id number]  Referring Provider: Hortencia Pilar, MD  DOB: 1932/05/17  PCP: Sharyne Peach, MD  DOS: 07/30/2020  Note by: Gillis Santa, MD  Service setting: Ambulatory outpatient  Specialty: Interventional Pain Management  Patient type: Established  Location: [facility] (AMB) Pain Management Facility  Visit type: Interventional Procedure   Primary Reason for Visit: Interventional Pain Management Treatment. CC: Back Pain (lumbar bilateral )  Procedure:          Anesthesia, Analgesia, Anxiolysis:  Type: Therapeutic Inter-Laminar Epidural Steroid Injection  #1 in 2021 Region: Lumbar Level: L2-3 Level. Laterality: Midline         Type: Local Anesthesia Indication(s): Analgesia         Route: Infiltration ([place]/IM) IV Access: Declined Sedation: Declined  Local Anesthetic: Lidocaine 1-2%  Position: Prone with head of the table was raised to facilitate breathing.   Indications: 1. Lumbar radiculopathy   2. SI joint arthritis   3. Chronic SI joint pain   4. Bilateral occipital neuralgia   5. Chronic pain syndrome    Pain Score: Pre-procedure: 4 /10 Post-procedure: 4 /10  Pre-op Assessment:  Sarah [name] is a 86 y.o. (year old), female patient, seen today for interventional treatment. She  has a past surgical history that includes Abdominal hysterectomy and Breast biopsy (Bilateral). Sarah [name] has a current medication list which includes the following prescription(s): acetaminophen, beta carotene w/minerals, dicyclomine, duloxetine, estradiol, gabapentin, gabapentin, lovastatin, lumigan, melatonin, mirabegron er, montelukast, simvastatin, and synthroid. Her primarily concern today is the Back Pain (lumbar bilateral )  Initial Vital Signs:  Pulse/HCG Rate: 76  Temp: (!) 97.4 F (36.3 C) Resp: 16 BP: 123/89 SpO2: 94 %  BMI: Estimated body mass index is 23.57 kg/m as calculated from the following:   Height as of this encounter: 5\' 8"  (1.727 m).    Weight as of this encounter: 155 lb (70.3 kg).  Risk Assessment: Allergies: Reviewed. She is allergic to codeine, hydrocodone, oxycodone, peanuts [peanut oil], penicillins, and tramadol.  Allergy Precautions: None required Coagulopathies: Reviewed. None identified.  Blood-thinner therapy: None at this time Active Infection(s): Reviewed. None identified. Sarah [name] is afebrile  Site Confirmation: Sarah [name] was asked to confirm the procedure and laterality before marking the site Procedure checklist: Completed Consent: Before the procedure and under the influence of no sedative(s), amnesic(s), or anxiolytics, the patient was informed of the treatment options, risks and possible complications. To fulfill our ethical and legal obligations, as recommended by the American Medical Association's Code of Ethics, I have informed the patient of my clinical impression; the nature and purpose of the treatment or procedure; the risks, benefits, and possible complications of the intervention; the alternatives, including doing nothing; the risk(s) and benefit(s) of the alternative treatment(s) or procedure(s); and the risk(s) and benefit(s) of doing nothing. The patient was provided information about the general risks and possible complications associated with the procedure. These may include, but are not limited to: failure to achieve desired goals, infection, bleeding, organ or nerve damage, allergic reactions, paralysis, and death. In addition, the patient was informed of those risks and complications associated to Spine-related procedures, such as failure to decrease pain; infection (i.e.: Meningitis, epidural or intraspinal abscess); bleeding (i.e.: epidural hematoma, subarachnoid hemorrhage, or any other type of intraspinal or peri-dural bleeding); organ or nerve damage (i.e.: Any type of peripheral nerve, nerve root, or spinal cord injury) with subsequent damage to sensory, motor, and/or autonomic systems,  resulting in permanent pain, numbness, and/or  weakness of one or several areas of the body; allergic reactions; (i.e.: anaphylactic reaction); and/or death. Furthermore, the patient was informed of those risks and complications associated with the medications. These include, but are not limited to: allergic reactions (i.e.: anaphylactic or anaphylactoid reaction(s)); adrenal axis suppression; blood sugar elevation that in diabetics may result in ketoacidosis or comma; water retention that in patients with history of congestive heart failure may result in shortness of breath, pulmonary edema, and decompensation with resultant heart failure; weight gain; swelling or edema; medication-induced neural toxicity; particulate matter embolism and blood vessel occlusion with resultant organ, and/or nervous system infarction; and/or aseptic necrosis of one or more joints. Finally, the patient was informed that Medicine is not an exact science; therefore, there is also the possibility of unforeseen or unpredictable risks and/or possible complications that may result in a catastrophic outcome. The patient indicated having understood very clearly. We have given the patient no guarantees and we have made no promises. Enough time was given to the patient to ask questions, all of which were answered to the patient's satisfaction. Sarah [name] has indicated that she wanted to continue with the procedure. Attestation: I, the ordering provider, attest that I have discussed with the patient the benefits, risks, side-effects, alternatives, likelihood of achieving goals, and potential problems during recovery for the procedure that I have provided informed consent. Date  Time: 07/30/2020  8:58 AM  Pre-Procedure Preparation:  Monitoring: As per clinic protocol. Respiration, ETCO2, SpO2, BP, heart rate and rhythm monitor placed and checked for adequate function Safety Precautions: Patient was assessed for positional comfort and  pressure points before starting the procedure. Time-out: I initiated and conducted the "Time-out" before starting the procedure, as per protocol. The patient was asked to participate by confirming the accuracy of the "Time Out" information. Verification of the correct person, site, and procedure were performed and confirmed by me, the nursing staff, and the patient. "Time-out" conducted as per Joint Commission's Universal Protocol (UP.01.01.01). Time: 0930  Description of Procedure:          Target Area: The interlaminar space, initially targeting the lower laminar border of the superior vertebral body. Approach: Paramedial approach. Area Prepped: Entire Posterior Lumbar Region Prepping solution: ChloraPrep (2% chlorhexidine gluconate and 70% isopropyl alcohol) Safety Precautions: Aspiration looking for blood return was conducted prior to all injections. At no point did we inject any substances, as a needle was being advanced. No attempts were made at seeking any paresthesias. Safe injection practices and needle disposal techniques used. Medications properly checked for expiration dates. SDV (single dose vial) medications used. Description of the Procedure: Protocol guidelines were followed. The procedure needle was introduced through the skin, ipsilateral to the reported pain, and advanced to the target area. Bone was contacted and the needle walked caudad, until the lamina was cleared. The epidural space was identified using "loss-of-resistance technique" with 2-3 ml of PF-NaCl (0.9% NSS), in a 5cc LOR glass syringe.  Vitals:   07/30/20 0916 07/30/20 0925 07/30/20 0935 07/30/20 0945  BP: 123/89 (!) 132/94 129/84 136/89  Pulse: 76 78 75 75  Resp: 16 12 15 16   Temp: (!) 97.4 F (36.3 C)     TempSrc: Temporal     SpO2: 94% 100% 99% 100%  Weight: 155 lb (70.3 kg)     Height: 5\' 8"  (1.727 m)       Start Time: 0931 hrs. End Time: 0944 hrs.  Materials:  Needle(s) Type: Epidural needle Gauge:  22G Length: 3.5-in Medication(s):  Please see orders for medications and dosing details. 7 CC solution made of 4 cc of preservative-free saline, 2 cc of 0.2% ropivacaine, 1 cc of Decadron 10 mg/cc. Imaging Guidance (Spinal):          Type of Imaging Technique: Fluoroscopy Guidance (Spinal) Indication(s): Assistance in needle guidance and placement for procedures requiring needle placement in or near specific anatomical locations not easily accessible without such assistance. Exposure Time: Please see nurses notes. Contrast: Before injecting any contrast, we confirmed that the patient did not have an allergy to iodine, shellfish, or radiological contrast. Once satisfactory needle placement was completed at the desired level, radiological contrast was injected. Contrast injected under live fluoroscopy. No contrast complications. See chart for type and volume of contrast used. Fluoroscopic Guidance: I was personally present during the use of fluoroscopy. "Tunnel Vision Technique" used to obtain the best possible view of the target area. Parallax error corrected before commencing the procedure. "Direction-depth-direction" technique used to introduce the needle under continuous pulsed fluoroscopy. Once target was reached, antero-posterior, oblique, and lateral fluoroscopic projection used confirm needle placement in all planes. Images permanently stored in EMR. Interpretation: I personally interpreted the imaging intraoperatively. Adequate needle placement confirmed in multiple planes. Appropriate spread of contrast into desired area was observed. No evidence of afferent or efferent intravascular uptake. No intrathecal or subarachnoid spread observed. Permanent images saved into the patient's record.  Antibiotic Prophylaxis:   Anti-infectives (From admission, onward)   None     Indication(s): None identified  Post-operative Assessment:  Post-procedure Vital Signs:  Pulse/HCG Rate: 75  Temp: (!) 97.4  F (36.3 C) Resp: 16 BP: 136/89 SpO2: 100 %  EBL: None  Complications: No immediate post-treatment complications observed by team, or reported by patient.  Note: The patient tolerated the entire procedure well. A repeat set of vitals were taken after the procedure and the patient was kept under observation following institutional policy, for this type of procedure. Post-procedural neurological assessment was performed, showing return to baseline, prior to discharge. The patient was provided with post-procedure discharge instructions, including a section on how to identify potential problems. Should any problems arise concerning this procedure, the patient was given instructions to immediately contact us, at any time, without hesitation. In any case, we plan to contact the patient by telephone for a follow-up status report regarding this interventional procedure.  Comments:  No additional relevant information. 5 out of 5 strength bilateral lower extremity: Plantar flexion, dorsiflexion, knee flexion, knee extension.  Plan of Care   Imaging Orders     DG PAIN CLINIC C-ARM 1-60 MIN NO REPORT  Procedure Orders     GREATER OCCIPITAL NERVE BLOCK  Medications ordered for procedure: Meds ordered this encounter  Medications  . iohexol (OMNIPAQUE) 180 MG/ML injection 10 mL    Must be Myelogram-compatible. If not available, you may substitute with a water-soluble, non-ionic, hypoallergenic, myelogram-compatible radiological contrast medium.  . ropivacaine (PF) 2 mg/mL (0.2%) (NAROPIN) injection 9 mL  . dexamethasone (DECADRON) injection 10 mg  . ropivacaine (PF) 2 mg/mL (0.2%) (NAROPIN) injection 9 mL  . methylPREDNISolone acetate (DEPO-MEDROL) injection 40 mg  . methylPREDNISolone acetate (DEPO-MEDROL) injection 40 mg   Medications administered: We administered iohexol, ropivacaine (PF) 2 mg/mL (0.2%), dexamethasone, ropivacaine (PF) 2 mg/mL (0.2%), methylPREDNISolone acetate, and  methylPREDNISolone acetate.  See the medical record for exact dosing, route, and time of administration.  Disposition: Discharge home  Discharge Date & Time: 07/30/2020; 0951 hrs.   Physician-requested Follow-up: Return in about 9  days (around 08/08/2020) for B/L Occipital NB.  Future Appointments  Date Time Provider [place]  08/13/2020  9:00 AM Gillis Santa, MD [facility] None  01/02/2021 10:00 AM [place], Marlowe Kays None   Primary Care Physician: Sharyne Peach, MD Location: [facility] Note by: Gillis Santa, MD Date: 07/30/2020; Time: 10:12 AM  Disclaimer:  Medicine is not an exact science. The only guarantee in medicine is that nothing is guaranteed. It is important to note that the decision to proceed with this intervention was based on the information collected from the patient. The Data and conclusions were drawn from the patient's questionnaire, the interview, and the physical examination. Because the information was provided in large part by the patient, it cannot be guaranteed that it has not been purposely or unconsciously manipulated. Every effort has been made to obtain as much relevant data as possible for this evaluation. It is important to note that the conclusions that lead to this procedure are derived in large part from the available data. Always take into account that the treatment will also be dependent on availability of resources and existing treatment guidelines, considered by other Pain Management Practitioners as being common knowledge and practice, at the time of the intervention. For Medico-Legal purposes, it is also important to point out that variation in procedural techniques and pharmacological choices are the acceptable norm. The indications, contraindications, technique, and results of the above procedure should only be interpreted and judged by a Board-Certified Interventional Pain Specialist with extensive  familiarity and expertise in the same exact procedure and technique.

## 2020-07-30 NOTE — Progress Notes (Signed)
Safety precautions to be maintained throughout the outpatient stay will include: orient to surroundings, keep bed in low position, maintain call bell within reach at all times, provide assistance with transfer out of bed and ambulation.  

## 2020-07-30 NOTE — Telephone Encounter (Signed)
Pt walked into office stating that the medication Mybetriq is too expensive, insurance does not cover it, wants to know if there is something else she can try. Please advise

## 2020-07-31 ENCOUNTER — Telehealth: Payer: Self-pay

## 2020-07-31 NOTE — Telephone Encounter (Signed)
Please explain to the patient that I believe the safest option for her pharmacologically at this point would be to resume trospium 60 mg extended release daily.  Otherwise, I would be happy to discuss alternative OAB therapies with her in a virtual visit.

## 2020-07-31 NOTE — Telephone Encounter (Signed)
Denies any needs at this time. She did say that she did not sleep last night due to the steroids. Instructed to call if needed.

## 2020-07-31 NOTE — Telephone Encounter (Signed)
Patient would like to resume the trospium 60 mg extended release. Her preferred pharmacy is [facility] in [place]. If this medication is too expensive, patient will then set up a virtual appt to discuss further options.

## 2020-08-02 ENCOUNTER — Other Ambulatory Visit: Payer: Self-pay | Admitting: Physician Assistant

## 2020-08-02 MED ORDER — TROSPIUM CHLORIDE ER 60 MG PO CP24: 60.0000 mg | ORAL_CAPSULE | Freq: Every day | ORAL | 5 refills | Status: DC

## 2020-08-02 NOTE — Telephone Encounter (Signed)
Patient notified

## 2020-08-02 NOTE — Telephone Encounter (Signed)
Rx sent, please notify pt

## 2020-08-13 ENCOUNTER — Other Ambulatory Visit
Admission: RE | Admit: 2020-08-13 | Discharge: 2020-08-13 | Disposition: A | Discharge status: Home / Self Care | Payer: Medicare Other | Attending: Urology | Admitting: Urology

## 2020-08-13 ENCOUNTER — Ambulatory Visit (INDEPENDENT_AMBULATORY_CARE_PROVIDER_SITE_OTHER): Payer: Medicare Other | Admitting: Urology

## 2020-08-13 ENCOUNTER — Other Ambulatory Visit: Payer: Self-pay

## 2020-08-13 ENCOUNTER — Encounter: Payer: Self-pay | Admitting: Urology

## 2020-08-13 ENCOUNTER — Encounter: Payer: Self-pay | Admitting: Student in an Organized Health Care Education/Training Program

## 2020-08-13 ENCOUNTER — Telehealth: Payer: Self-pay | Admitting: Urology

## 2020-08-13 ENCOUNTER — Ambulatory Visit (HOSPITAL_BASED_OUTPATIENT_CLINIC_OR_DEPARTMENT_OTHER): Payer: Medicare Other | Admitting: Student in an Organized Health Care Education/Training Program

## 2020-08-13 ENCOUNTER — Ambulatory Visit: Admission: RE | Admit: 2020-08-13 | Payer: Medicare Other | Source: Ambulatory Visit

## 2020-08-13 VITALS — BP 132/78 | HR 82 | Ht 68.0 in | Wt 153.0 lb

## 2020-08-13 VITALS — BP 147/83 | HR 74 | Temp 97.2°F | Resp 18 | Ht 68.0 in | Wt 153.0 lb

## 2020-08-13 DIAGNOSIS — N390 Urinary tract infection, site not specified: Secondary | ICD-10-CM | POA: Insufficient documentation

## 2020-08-13 DIAGNOSIS — M5481 Occipital neuralgia: Secondary | ICD-10-CM

## 2020-08-13 DIAGNOSIS — G894 Chronic pain syndrome: Secondary | ICD-10-CM

## 2020-08-13 LAB — URINALYSIS, COMPLETE (UACMP) WITH MICROSCOPIC
Bilirubin Urine: NEGATIVE
Glucose, UA: NEGATIVE mg/dL
Ketones, ur: NEGATIVE mg/dL
Nitrite: NEGATIVE
Protein, ur: NEGATIVE mg/dL
Specific Gravity, Urine: 1.02 (ref 1.005–1.030)
pH: 6.5 (ref 5.0–8.0)

## 2020-08-13 LAB — BLADDER SCAN AMB NON-IMAGING

## 2020-08-13 MED ORDER — ROPIVACAINE HCL 2 MG/ML IJ SOLN
INTRAMUSCULAR | Status: AC
  Filled 2020-08-13: qty 10

## 2020-08-13 MED ORDER — DEXAMETHASONE SODIUM PHOSPHATE 10 MG/ML IJ SOLN
INTRAMUSCULAR | Status: AC
  Filled 2020-08-13: qty 1

## 2020-08-13 MED ORDER — LIDOCAINE HCL 2 % IJ SOLN
20.0000 mL | Freq: Once | INTRAMUSCULAR | Status: AC
  Administered 2020-08-13: 400 mg

## 2020-08-13 MED ORDER — LIDOCAINE HCL 2 % IJ SOLN
INTRAMUSCULAR | Status: AC
  Filled 2020-08-13: qty 20

## 2020-08-13 MED ORDER — CEFUROXIME AXETIL 250 MG PO TABS: 250.0000 mg | ORAL_TABLET | Freq: Two times a day (BID) | ORAL | 0 refills | Status: DC

## 2020-08-13 MED ORDER — DEXAMETHASONE SODIUM PHOSPHATE 10 MG/ML IJ SOLN
10.0000 mg | Freq: Once | INTRAMUSCULAR | Status: AC
  Administered 2020-08-13: 10 mg

## 2020-08-13 NOTE — Patient Instructions (Signed)
Pain Management Discharge Instructions  General Discharge Instructions :  If you need to reach your doctor call: Monday-Friday 8:00 am - 4:00 pm at [phone] or toll free [phone].  After clinic hours [phone] to have operator reach doctor.  Bring all of your medication bottles to all your appointments in the pain clinic.  To cancel or reschedule your appointment with Pain Management please remember to call 24 hours in advance to avoid a fee.  Refer to the educational materials which you have been given on: General Risks, I had my Procedure. Discharge Instructions, Post Sedation.  Post Procedure Instructions:  The drugs you were given will stay in your system until tomorrow, so for the next 24 hours you should not drive, make any legal decisions or drink any alcoholic beverages.  You may eat anything you prefer, but it is better to start with liquids then soups and crackers, and gradually work up to solid foods.  Please notify your doctor immediately if you have any unusual bleeding, trouble breathing or pain that is not related to your normal pain.  Depending on the type of procedure that was done, some parts of your body may feel week and/or numb.  This usually clears up by tonight or the next day.  Walk with the use of an assistive device or accompanied by an adult for the 24 hours.  You may use ice on the affected area for the first 24 hours.  Put ice in a Ziploc bag and cover with a towel and place against area 15 minutes on 15 minutes off.  You may switch to heat after 24 hours.  Occipital Nerve Block Patient Information  Description: The occipital nerves originate in the cervical (neck) spinal cord and travel upward through muscle and tissue to supply sensation to the back of the head and top of the scalp.  In addition, the nerves control some of the muscles of the scalp.  Occipital neuralgia is an irritation of these nerves which can cause headaches, numbness of the  scalp, and neck discomfort.     The occipital nerve block will interrupt nerve transmission through these nerves and can relieve pain and spasm.  The block consists of insertion of a small needle under the skin in the back of the head to deposit local anesthetic (numbing medicine) and/or steroids around the nerve.  The entire block usually lasts less than 5 minutes.  Conditions which may be treated by occipital blocks:   Muscular pain and spasm of the scalp  Nerve irritation, back of the head  Headaches  Upper neck pain  Preparation for the injection:  1. Do not eat any solid food or dairy products within 8 hours of your appointment. 2. You may drink clear liquids up to 3 hours before appointment.  Clear liquids include water, black coffee, juice or soda.  No milk or cream please. 3. You may take your regular medication, including pain medications, with a sip of water before you appointment.  Diabetics should hold regular insulin (if taken separately) and take 1/2 normal NPH dose the morning of the procedure.  Carry some sugar containing items with you to your appointment. 4. A driver must accompany you and be prepared to drive you home after your procedure. 5. Bring all your current medications with you. 6. An IV may be inserted and sedation may be given at the discretion of the physician. 7. A blood pressure cuff, EKG, and other monitors will often be applied during the procedure.    Some patients may need to have extra oxygen administered for a short period. 8. You will be asked to provide medical information, including your allergies and medications, prior to the procedure.  We must know immediately if you are taking blood thinners (like Coumadin/Warfarin) or if you are allergic to IV iodine contrast (dye).  We must know if you could possible be pregnant.  9. Do not wear a high collared shirt or turtleneck.  Tie long hair up in the back if possible.  Possible side-effects:   Bleeding  from needle site  Infection (rare, may require surgery)  Nerve injury (rare)  Hair on back of neck can be tinged with iodine scrub (this will wash out)  Light-headedness (temporary)  Pain at injection site (several days)  Decreased blood pressure (rare, temporary)  Seizure (very rare)  Call if you experience:   Hives or difficulty breathing ( go to the emergency room)  Inflammation or drainage at the injection site(s)  Please note:  Although the local anesthetic injected can often make your painful muscles or headache feel good for several hours after the injection, the pain may return.  It takes 3-7 days for steroids to work.  You may not notice any pain relief for at least one week.  If effective, we will often do a series of injections spaced 3-6 weeks apart to maximally decrease your pain.  If you have any questions, please call [phone] [facility]  

## 2020-08-13 NOTE — Telephone Encounter (Signed)
Pt came in to [place] office w/UTI symptoms, burning with urination.  She is going out of town and wanted to be seen today, if possible.  Pt will see Larene Beach today in [place] at 45.

## 2020-08-13 NOTE — Progress Notes (Signed)
08/13/2020 11:31 AM   [name] 06-07-1932 [id number]  CC: Chief Complaint  Patient presents with  . Dysuria    HPI: Sarah [name] is a 86 y.o. female with PMH OAB previously on trospium, recurrent UTI and vaginal atrophy who presents today for symptoms of an UTI.    She was at goal with the Myrbetriq, but it was cost prohibitive.  She is back on her trospium XL 60 mg daily.   She has been experiencing frequency, urgency, dysuria and chills when she urinates.  She has also been constipated.  She was on MiraLax for the last three weeks without relief.  Patient denies any modifying or aggravating factors.  Patient denies any gross hematuria, dysuria or suprapubic/flank pain.  Patient denies any fevers, chills, nausea or vomiting.   Her UA with 11-20 WBC's and rare bacteria.  Her PVR is 13 mL.    PMH: Past Medical History:  Diagnosis Date  . Benign essential HTN 09/21/2015  . Cancer ([place])    Skin CA resected from Right eyelid and both legs.  . Seizures ([place])   . Thyroid disease     Surgical History: Past Surgical History:  Procedure Laterality Date  . ABDOMINAL HYSTERECTOMY    . BREAST BIOPSY Bilateral    neg    Home Medications:  Allergies as of 08/13/2020      Reactions   Codeine Other (See Comments)   Makes her disoriented   Hydrocodone Other (See Comments)   Patient states she was seeing things that weren't there and very disoriented.   Oxycodone Other (See Comments)   hallucinations   Peanuts [peanut Oil] Other (See Comments)   Violent Migraines   Penicillins Nausea Only   Tramadol Other (See Comments)   Dizziness.      Medication List       Accurate as of August 13, 2020 11:31 AM. If you have any questions, ask your nurse or doctor.        acetaminophen 500 MG tablet Commonly known as: TYLENOL Take 1,000 mg by mouth every 6 (six) hours as needed.   beta carotene w/minerals tablet Take 1 tablet by mouth daily.    cefUROXime 250 MG tablet Commonly known as: CEFTIN Take 1 tablet (250 mg total) by mouth 2 (two) times daily with a meal. Started by: Larene Beach Antaniya Venuti, PA-C   dicyclomine 10 MG capsule Commonly known as: BENTYL Take 10 mg by mouth 4 (four) times daily -  before meals and at bedtime.   DULoxetine 20 MG capsule Commonly known as: CYMBALTA Take 20 mg by mouth daily.   estradiol 0.1 MG/GM vaginal cream Commonly known as: ESTRACE VAGINAL Apply one pea-sized amount around the opening of the urethra three times weekly.   gabapentin 300 MG capsule Commonly known as: NEURONTIN Take by mouth. What changed: Another medication with the same name was removed. Continue taking this medication, and follow the directions you see here. Changed by: Zara Council, PA-C   lovastatin 20 MG tablet Commonly known as: MEVACOR Take 20 mg by mouth daily.   Lumigan 0.01 % Soln Generic drug: bimatoprost Place 1 drop into the left eye at bedtime.   melatonin 1 MG Tabs tablet Take 10 mg by mouth once.   montelukast 10 MG tablet Commonly known as: SINGULAIR Take 10 mg by mouth at bedtime.   simvastatin 10 MG tablet Commonly known as: ZOCOR Take 10 mg by mouth at bedtime.   Synthroid 112 MCG tablet Generic drug:  levothyroxine Take 1 tablet by mouth daily.   Trospium Chloride 60 MG Cp24 Take 1 capsule (60 mg total) by mouth daily.       Allergies:  Allergies  Allergen Reactions  . Codeine Other (See Comments)    Makes her disoriented  . Hydrocodone Other (See Comments)    Patient states she was seeing things that weren't there and very disoriented.  . Oxycodone Other (See Comments)    hallucinations  . Peanuts [Peanut Oil] Other (See Comments)    Violent Migraines  . Penicillins Nausea Only  . Tramadol Other (See Comments)    Dizziness.    Family History: Family History  Problem Relation Age of Onset  . Breast cancer Mother 65  . Breast cancer Maternal Uncle 60  . Breast  cancer Maternal Aunt        mat great aunt  . Breast cancer Maternal Aunt 54  . Tracheal cancer Father   . Cancer Sister   . Brain cancer Sister   . Cancer Brother     Social History:   reports that she has never smoked. She has never used smokeless tobacco. She reports that she does not drink alcohol. No history on file for drug use.  Physical Exam: BP 132/78   Pulse 82   Ht _0  (1.727 m)   Wt 153 lb (69.4 kg)   BMI 23.26 kg/m   Constitutional:  Well nourished. Alert and oriented, No acute distress. HEENT: [place] AT, mask in place.  Trachea midline Cardiovascular: No clubbing, cyanosis, or edema. Respiratory: Normal respiratory effort, no increased work of breathing. Neurologic: Grossly intact, no focal deficits, moving all 4 extremities. Psychiatric: Normal mood and affect.   Laboratory Data: Component     Latest Ref Rng & Units 08/13/2020          Color, Urine     YELLOW YELLOW  Appearance     CLEAR HAZY (A)  Specific Gravity, Urine     1.005 - 1.030 1.020  pH     5.0 - 8.0 6.5  Glucose, UA     NEGATIVE mg/dL NEGATIVE  Hgb urine dipstick     NEGATIVE TRACE (A)  Bilirubin Urine     NEGATIVE NEGATIVE  Ketones, ur     NEGATIVE mg/dL NEGATIVE  Protein     NEGATIVE mg/dL NEGATIVE  Nitrite     NEGATIVE NEGATIVE  Leukocytes,Ua     NEGATIVE SMALL (A)  Squamous Epithelial / LPF     0 - 5 6-10  WBC, UA     0 - 5 WBC/hpf 11-20  RBC / HPF     0 - 5 RBC/hpf 0-5  Bacteria, UA     NONE SEEN RARE (A)   Specimen:  Blood  Ref Range & Units 2 mo ago Comments  Sodium 135 - 145 mmol/L 138        Potassium 3.5 - 5.0 mmol/L 3.9        Chloride 98 - 108 mmol/L 100        Carbon Dioxide (CO2) 21 - 30 mmol/L 26        Urea Nitrogen (BUN) 7 - 20 mg/dL 9        Creatinine 0.4 - 1.0 mg/dL 0.9        Glucose 70 - 140 mg/dL 103  Interpretive Data:  Above is the NONFASTING reference range.   Below are the FASTING reference ranges:  NORMAL:   70-99 mg/dL   PREDIABETES: 100-125  mg/dL  DIABETES:  > 125 mg/dL       Calcium 8.7 - 10.2 mg/dL 10.0        Anion Gap 3 - 12 mmol/L 12        BUN/CREA Ratio 6 - 27  10        Glomerular Filtration Rate (eGFR)  mL/min/1.73sq m 58   NON-Modified eGFR: 58 mL/min/1.73sq m .  We recommend using this value for referral decisions.   I have reviewed the labs.  Assessment & Plan:    1. rUTI's Symptomatic at this visit UA with pyuria Will start Ceftin at this visit and adjust if necessary when sensitivities return Urine sent for culture  2. Urinary frequency Myrbetriq was cost prohibitive Continue trospium   3. Constipation Encouraged patient to take a laxative as MiraLax is not effective  Return for Pending urine culture results .  Zara Council, PA-C  [facility] [address] [place], [place] [address] [phone]

## 2020-08-13 NOTE — Progress Notes (Signed)
Safety precautions to be maintained throughout the outpatient stay will include: orient to surroundings, keep bed in low position, maintain call bell within reach at all times, provide assistance with transfer out of bed and ambulation.  

## 2020-08-13 NOTE — Progress Notes (Signed)
PROVIDER NOTE: Information contained herein reflects review and annotations entered in association with encounter. Interpretation of such information and data should be left to medically-trained personnel. Information provided to patient can be located elsewhere in the medical record under "Patient Instructions". Document created using STT-dictation technology, any transcriptional errors that may result from process are unintentional.    Patient: Sarah [name]  Service Category: Procedure  Provider: Gillis Santa, MD  DOB: [birth year]  DOS: 08/13/2020  Location: [facility]  MRN: [id number]  Setting: Ambulatory - outpatient  Referring Provider: Sharyne Peach, MD  Type: Established Patient  Specialty: Interventional Pain Management  PCP: Sharyne Peach, MD   Primary Reason for Visit: Interventional Pain Management Treatment. CC: Headache and Neck Pain  Procedure:          Anesthesia, Analgesia, Anxiolysis:  Type: Diagnostic, Greater, Occipital Nerve Block  #1  Region: Posterolateral Cervical Level: Occipital Ridge   Laterality: Bilateral  Type: Local Anesthesia  Local Anesthetic: Lidocaine 1-2%  Position: Prone   Indications: 1. Bilateral occipital neuralgia   2. Chronic pain syndrome    Pain Score: Pre-procedure: 4 /10 Post-procedure: (P) 4 /10   Pre-op H&P Assessment:  Ms. Quesenberry is a 86 y.o. (year old), female patient, seen today for interventional treatment. She  has a past surgical history that includes Abdominal hysterectomy and Breast biopsy (Bilateral). Ms. Lax has a current medication list which includes the following prescription(s): acetaminophen, beta carotene w/minerals, dicyclomine, duloxetine, estradiol, gabapentin, gabapentin, lovastatin, lumigan, melatonin, montelukast, simvastatin, synthroid, and trospium chloride. Her primarily concern today is the Headache and Neck Pain  Initial Vital Signs:  Pulse/HCG Rate: 74ECG Heart Rate:  75 Temp: (!) 97.2 F (36.2 C) Resp: 18 BP: 125/81 SpO2: 99 %  BMI: Estimated body mass index is 23.26 kg/m as calculated from the following:   Height as of this encounter: 5\' 8"  (1.727 m).   Weight as of this encounter: 153 lb (69.4 kg).  Risk Assessment: Allergies: Reviewed. She is allergic to codeine, hydrocodone, oxycodone, peanuts [peanut oil], penicillins, and tramadol.  Allergy Precautions: None required Coagulopathies: Reviewed. None identified.  Blood-thinner therapy: None at this time Active Infection(s): Reviewed. None identified. Ms. Sardo is afebrile  Site Confirmation: Ms. Wyly was asked to confirm the procedure and laterality before marking the site Procedure checklist: Completed Consent: Before the procedure and under the influence of no sedative(s), amnesic(s), or anxiolytics, the patient was informed of the treatment options, risks and possible complications. To fulfill our ethical and legal obligations, as recommended by the American Medical Association's Code of Ethics, I have informed the patient of my clinical impression; the nature and purpose of the treatment or procedure; the risks, benefits, and possible complications of the intervention; the alternatives, including doing nothing; the risk(s) and benefit(s) of the alternative treatment(s) or procedure(s); and the risk(s) and benefit(s) of doing nothing. The patient was provided information about the general risks and possible complications associated with the procedure. These may include, but are not limited to: failure to achieve desired goals, infection, bleeding, organ or nerve damage, allergic reactions, paralysis, and death. In addition, the patient was informed of those risks and complications associated to the procedure, such as failure to decrease pain; infection; bleeding; organ or nerve damage with subsequent damage to sensory, motor, and/or autonomic systems, resulting in permanent pain, numbness, and/or  weakness of one or several areas of the body; allergic reactions; (i.e.: anaphylactic reaction); and/or death. Furthermore, the patient was informed of those risks  and complications associated with the medications. These include, but are not limited to: allergic reactions (i.e.: anaphylactic or anaphylactoid reaction(s)); adrenal axis suppression; blood sugar elevation that in diabetics may result in ketoacidosis or comma; water retention that in patients with history of congestive heart failure may result in shortness of breath, pulmonary edema, and decompensation with resultant heart failure; weight gain; swelling or edema; medication-induced neural toxicity; particulate matter embolism and blood vessel occlusion with resultant organ, and/or nervous system infarction; and/or aseptic necrosis of one or more joints. Finally, the patient was informed that Medicine is not an exact science; therefore, there is also the possibility of unforeseen or unpredictable risks and/or possible complications that may result in a catastrophic outcome. The patient indicated having understood very clearly. We have given the patient no guarantees and we have made no promises. Enough time was given to the patient to ask questions, all of which were answered to the patient's satisfaction. Ms. Debes has indicated that she wanted to continue with the procedure. Attestation: I, the ordering provider, attest that I have discussed with the patient the benefits, risks, side-effects, alternatives, likelihood of achieving goals, and potential problems during recovery for the procedure that I have provided informed consent. Date  Time: 08/13/2020  8:49 AM  Pre-Procedure Preparation:  Monitoring: As per clinic protocol. Respiration, ETCO2, SpO2, BP, heart rate and rhythm monitor placed and checked for adequate function Safety Precautions: Patient was assessed for positional comfort and pressure points before starting the  procedure. Time-out: I initiated and conducted the "Time-out" before starting the procedure, as per protocol. The patient was asked to participate by confirming the accuracy of the "Time Out" information. Verification of the correct person, site, and procedure were performed and confirmed by me, the nursing staff, and the patient. "Time-out" conducted as per Joint Commission's Universal Protocol (UP.01.01.01). Time: 0926  Description of Procedure:          Target Area: Area medial to the occipital artery at the level of the superior nuchal ridge Approach: Posterior approach Area Prepped: Entire Posterior Occipital Region DuraPrep (Iodine Povacrylex [0.7% available iodine] and Isopropyl Alcohol, 74% w/w) Safety Precautions: Aspiration looking for blood return was conducted prior to all injections. At no point did we inject any substances, as a needle was being advanced. No attempts were made at seeking any paresthesias. Safe injection practices and needle disposal techniques used. Medications properly checked for expiration dates. SDV (single dose vial) medications used. Description of the Procedure: Protocol guidelines were followed. The target area was identified and the area prepped in the usual manner. Skin & deeper tissues infiltrated with local anesthetic. Appropriate amount of time allowed to pass for local anesthetics to take effect. The procedure needles were then advanced to the target area. Proper needle placement secured. Negative aspiration confirmed. Solution injected in intermittent fashion, asking for systemic symptoms every 0.5cc of injectate. The needles were then removed and the area cleansed, making sure to leave some of the prepping solution back to take advantage of its long term bactericidal properties.  Vitals:   08/13/20 0854 08/13/20 0926 08/13/20 0932  BP: 125/81 133/84 (!) 147/83  Pulse: 74    Resp: 18 16 18   Temp: (!) 97.2 F (36.2 C)    SpO2: 99% 98% 100%  Weight: 153  lb (69.4 kg)    Height: 5\' 8"  (1.727 m)      Start Time: 0926 hrs. End Time: 0932 hrs. Materials:  Needle(s) Type: Regular needle Gauge: 25G Length: 1.5-in Medication(s):  Please see orders for medications and dosing details. 4 cc solution made of 3 cc of 0.2% ropivacaine, 1 cc of Decadron 10 mg/cc. Injected at the left greater occipital nerve 4 cc solution made of 3 cc of 0.2% ropivacaine, 1 cc of Decadron 10 mg/cc. Injected at the right greater occipital nerve   Post-operative Assessment:  Post-procedure Vital Signs:  Pulse/HCG Rate: 7473 Temp: (!) 97.2 F (36.2 C) Resp: 18 BP: (!) 147/83 SpO2: 100 %  EBL: None  Complications: No immediate post-treatment complications observed by team, or reported by patient.  Note: The patient tolerated the entire procedure well. A repeat set of vitals were taken after the procedure and the patient was kept under observation following institutional policy, for this type of procedure. Post-procedural neurological assessment was performed, showing return to baseline, prior to discharge. The patient was provided with post-procedure discharge instructions, including a section on how to identify potential problems. Should any problems arise concerning this procedure, the patient was given instructions to immediately contact us, at any time, without hesitation. In any case, we plan to contact the patient by telephone for a follow-up status report regarding this interventional procedure.  Comments:  No additional relevant information.  Plan of Care    Medications ordered for procedure: Meds ordered this encounter  Medications  . lidocaine (XYLOCAINE) 2 % (with pres) injection 400 mg  . dexamethasone (DECADRON) injection 10 mg   Medications administered: We administered lidocaine and dexamethasone.  See the medical record for exact dosing, route, and time of administration.  Follow-up plan:   Return in about 6 weeks (around 09/24/2020) for Post  Procedure Evaluation, virtual.      Finds benefit with bilateral sacroiliac joint injection, previous one 03/14/2019 and then second 1 05/25/2019.  Also finds lumbar epidural steroid injections for when she has sciatic flare, consider repeating.  Previous one performed on 10/13/2018 at [facility].  Status post bilateral C4, C5, C6, C7 cervical facet medial branch nerve block on 03/05/2020, 05/21/20 for cervical facet arthropathy. Bilateral greater occipital nerve block 08/13/2020.     Recent Visits Date Type Provider Dept  07/30/20 Procedure visit Gillis Santa, MD [facility]  07/12/20 Office Visit Gillis Santa, MD [facility]  05/21/20 Procedure visit Gillis Santa, MD [facility]  Showing recent visits within past 90 days and meeting all other requirements Today's Visits Date Type Provider Dept  08/13/20 Procedure visit Gillis Santa, MD [facility]  Showing today's visits and meeting all other requirements Future Appointments Date Type Provider Dept  09/24/20 Appointment Gillis Santa, MD [facility]  Showing future appointments within next 90 days and meeting all other requirements  Disposition: Discharge home  Discharge (Date  Time): 08/13/2020; 0945 hrs.   Primary Care Physician: Sharyne Peach, MD Location: [facility] Note by: Gillis Santa, MD Date: 08/13/2020; Time: 10:20 AM  Disclaimer:  Medicine is not an exact science. The only guarantee in medicine is that nothing is guaranteed. It is important to note that the decision to proceed with this intervention was based on the information collected from the patient. The Data and conclusions were drawn from the patient's questionnaire, the interview, and the physical examination. Because the information was provided in large part by the patient, it cannot be guaranteed that it has not been purposely or unconsciously manipulated. Every effort has been made to  obtain as much relevant data as possible for this evaluation. It is important to note that the  conclusions that lead to this procedure are derived in large part from the available data. Always take into account that the treatment will also be dependent on availability of resources and existing treatment guidelines, considered by other Pain Management Practitioners as being common knowledge and practice, at the time of the intervention. For Medico-Legal purposes, it is also important to point out that variation in procedural techniques and pharmacological choices are the acceptable norm. The indications, contraindications, technique, and results of the above procedure should only be interpreted and judged by a Board-Certified Interventional Pain Specialist with extensive familiarity and expertise in the same exact procedure and technique.

## 2020-08-14 ENCOUNTER — Telehealth: Payer: Self-pay

## 2020-08-14 LAB — URINE CULTURE

## 2020-08-14 NOTE — Telephone Encounter (Signed)
Post procedure phone call.  Patient states she is doing ok.  

## 2020-08-15 ENCOUNTER — Telehealth: Payer: Self-pay | Admitting: Family Medicine

## 2020-08-15 ENCOUNTER — Encounter: Payer: Self-pay | Admitting: Family Medicine

## 2020-08-15 NOTE — Telephone Encounter (Signed)
LMOM informed patient message below.

## 2020-08-15 NOTE — Telephone Encounter (Signed)
-----   Message from Nori Riis, PA-C sent at 08/14/2020  9:25 PM EST ----- Please let Mrs. Harnisch know that her urine culture returned with multiple species.  Ideally, we should obtain a cath specimen.  She is leaving for [place] this week, so if she cannot come tomorrow for a CATH UA, she will need to finish her antibiotic and follow up when she returns.

## 2020-08-20 ENCOUNTER — Other Ambulatory Visit: Payer: Self-pay | Admitting: Family Medicine

## 2020-08-20 NOTE — Telephone Encounter (Signed)
LMOM for patient to return call.

## 2020-08-22 NOTE — Telephone Encounter (Signed)
3rd attempt:  Unable to reach patient

## 2020-09-03 ENCOUNTER — Ambulatory Visit: Payer: Medicare Other | Admitting: Urology

## 2020-09-04 ENCOUNTER — Other Ambulatory Visit: Payer: Self-pay

## 2020-09-04 ENCOUNTER — Ambulatory Visit (INDEPENDENT_AMBULATORY_CARE_PROVIDER_SITE_OTHER): Payer: Medicare Other | Admitting: Physician Assistant

## 2020-09-04 ENCOUNTER — Encounter: Payer: Self-pay | Admitting: Physician Assistant

## 2020-09-04 VITALS — BP 113/74 | HR 93 | Ht 68.0 in | Wt 153.6 lb

## 2020-09-04 DIAGNOSIS — N390 Urinary tract infection, site not specified: Secondary | ICD-10-CM

## 2020-09-04 NOTE — Progress Notes (Signed)
In and Out Catheterization  Patient is present today for I & O catheterization due to recurrent UTI. Patient was cleaned and prepped in a sterile fashion with betadine . A 14FR cath was inserted no complications were noted , 35ml of urine return was noted, urine was yellow in color. A clean urine sample was collected for UA and culture. Bladder was drained and catheter was removed without difficulty.    Performed by: Debroah Loop, PA-C   Additional notes: Patient denies irritative symptoms today, no concern for UTI. Obtaining repeat cath UA given recent multiple species-positive culture per Zara Council. Will defer treatment pending development of irritative symptoms.  Follow up: Return if symptoms worsen or fail to improve.

## 2020-09-05 LAB — MICROSCOPIC EXAMINATION

## 2020-09-05 LAB — URINALYSIS, COMPLETE
Bilirubin, UA: NEGATIVE
Glucose, UA: NEGATIVE
Ketones, UA: NEGATIVE
Nitrite, UA: NEGATIVE
Protein,UA: NEGATIVE
Specific Gravity, UA: 1.02 (ref 1.005–1.030)
Urobilinogen, Ur: 0.2 mg/dL (ref 0.2–1.0)
pH, UA: 6 (ref 5.0–7.5)

## 2020-09-10 LAB — CULTURE, URINE COMPREHENSIVE

## 2020-09-11 NOTE — Progress Notes (Signed)
09/12/2020 3:44 PM   Sarah [name] Dec 18, 1931 [id number]  Referring provider: Sharyne Peach, MD [place],  [place] [address]  Chief Complaint  Patient presents with  . Dysuria   Urological history 1. OAB - on trospium XL 60 mg daily   2. Vaginal atrophy - could not tolerate vaginal estrogen cream  HPI: Sarah [name] is a 86 y.o. who presets today for burning with urination.    She has had persistent microscopic hematuria for the last few months with positive and negative cultures.  She had 3-10 RBC's on 05/21/2020 and 09/04/2020 with negative cultures.  RUS in 09/2019 with prominent bilateral extra renal pelvises.   She states that the dysuria, urgency and urge incontinence is worse now than it ever has been in the past.  Patient denies any modifying or aggravating factors.  Patient denies any gross hematuria, dysuria or suprapubic/flank pain.  Patient denies any fevers, chills, nausea or vomiting.     UA with 11-30 WBC's.  KUB today negative for stones.  Tacking screws are seen in the pelvis from previous sling procedure.    PMH: Past Medical History:  Diagnosis Date  . Benign essential HTN 09/21/2015  . Cancer ([place])    Skin CA resected from Right eyelid and both legs.  . Seizures ([place])   . Thyroid disease     Surgical History: Past Surgical History:  Procedure Laterality Date  . ABDOMINAL HYSTERECTOMY    . BREAST BIOPSY Bilateral    neg    Home Medications:  Allergies as of 09/12/2020      Reactions   Codeine Other (See Comments)   Makes her disoriented   Hydrocodone Other (See Comments)   Patient states she was seeing things that weren't there and very disoriented.   Oxycodone Other (See Comments)   hallucinations   Peanut-containing Drug Products    Other reaction(s): Headache   Peanuts [peanut Oil] Other (See Comments)   Violent Migraines   Penicillins Nausea Only   Tramadol Other (See Comments)   Dizziness.       Medication List       Accurate as of September 12, 2020 11:59 PM. If you have any questions, ask your nurse or doctor.        STOP taking these medications   cefUROXime 250 MG tablet Commonly known as: CEFTIN Stopped by: Zara Council, PA-C     TAKE these medications   acetaminophen 500 MG tablet Commonly known as: TYLENOL Take 1,000 mg by mouth every 6 (six) hours as needed.   beta carotene w/minerals tablet Take 1 tablet by mouth daily.   cephALEXin 500 MG capsule Commonly known as: Keflex Take 1 capsule (500 mg total) by mouth 2 (two) times daily. Started by: Zara Council, PA-C   dicyclomine 10 MG capsule Commonly known as: BENTYL Take 10 mg by mouth 4 (four) times daily -  before meals and at bedtime.   DULoxetine 20 MG capsule Commonly known as: CYMBALTA Take 20 mg by mouth daily.   estradiol 0.1 MG/GM vaginal cream Commonly known as: ESTRACE VAGINAL Apply one pea-sized amount around the opening of the urethra three times weekly.   gabapentin 300 MG capsule Commonly known as: NEURONTIN Take by mouth.   levothyroxine 100 MCG tablet Commonly known as: SYNTHROID Take 100 mcg by mouth daily. What changed: Another medication with the same name was removed. Continue taking this medication, and follow the directions you see here. Changed by:  Neshawn Aird, PA-C   lovastatin 20 MG tablet Commonly known as: MEVACOR Take 20 mg by mouth daily.   Lumigan 0.01 % Soln Generic drug: bimatoprost Place 1 drop into the left eye at bedtime.   melatonin 1 MG Tabs tablet Take 10 mg by mouth once.   montelukast 10 MG tablet Commonly known as: SINGULAIR Take 10 mg by mouth at bedtime.   simvastatin 10 MG tablet Commonly known as: ZOCOR Take 10 mg by mouth at bedtime.   Trospium Chloride 60 MG Cp24 Take 1 capsule (60 mg total) by mouth daily.       Allergies:  Allergies  Allergen Reactions  . Codeine Other (See Comments)    Makes her disoriented   . Hydrocodone Other (See Comments)    Patient states she was seeing things that weren't there and very disoriented.  . Oxycodone Other (See Comments)    hallucinations  . Peanut-Containing Drug Products     Other reaction(s): Headache  . Peanuts [Peanut Oil] Other (See Comments)    Violent Migraines  . Penicillins Nausea Only  . Tramadol Other (See Comments)    Dizziness.    Family History: Family History  Problem Relation Age of Onset  . Breast cancer Mother 40  . Breast cancer Maternal Uncle 60  . Breast cancer Maternal Aunt        mat great aunt  . Breast cancer Maternal Aunt 63  . Tracheal cancer Father   . Cancer Sister   . Brain cancer Sister   . Cancer Brother     Social History:  reports that she has never smoked. She has never used smokeless tobacco. She reports that she does not drink alcohol. No history on file for drug use.  ROS: Pertinent ROS in HPI  Physical Exam: BP 128/78   Pulse 88   Ht 5\' 8"  (1.727 m)   Wt 154 lb (69.9 kg)   BMI 23.42 kg/m   Constitutional:  Well nourished. Alert and oriented, No acute distress. HEENT: [place] AT, mask in place.  Trachea midline Cardiovascular: No clubbing, cyanosis, or edema. Respiratory: Normal respiratory effort, no increased work of breathing. Neurologic: Grossly intact, no focal deficits, moving all 4 extremities. Psychiatric: Normal mood and affect.  Laboratory Data: Lab Results  Component Value Date   WBC 5.1 09/14/2015   HGB 13.2 09/14/2015   HCT 38.3 09/14/2015   MCV 88.1 09/14/2015   PLT 177 09/14/2015    Lab Results  Component Value Date   CREATININE 0.80 09/12/2020    Lab Results  Component Value Date   TSH 6.05 (H) 02/15/2013    Lab Results  Component Value Date   AST 19 11/26/2015   Lab Results  Component Value Date   ALT 15 11/26/2015    Urinalysis Component     Latest Ref Rng & Units 09/12/2020          Specific Gravity, UA     1.005 - 1.030 1.015  pH, UA     5.0 - 7.5  6.0  Color, UA     Yellow Yellow  Appearance Ur     Clear Cloudy (A)  Leukocytes,UA     Negative 2+ (A)  Protein,UA     Negative/Trace Trace (A)  Glucose, UA     Negative Negative  Ketones, UA     Negative Negative  RBC, UA     Negative Trace (A)  Bilirubin, UA     Negative Negative  Urobilinogen, Ur  0.2 - 1.0 mg/dL 0.2  Nitrite, UA     Negative Negative  Microscopic Examination      See below:   Component     Latest Ref Rng & Units 09/12/2020          WBC, UA     0 - 5 /hpf 11-30 (A)  RBC     0 - 2 /hpf 0-2  Epithelial Cells (non renal)     0 - 10 /hpf 0-10  Bacteria, UA     None seen/Few None seen   I have reviewed the labs.   Pertinent Imaging: CLINICAL DATA:  Flank pain and hematuria  EXAM: ABDOMEN - 1 VIEW  COMPARISON:  09/27/2019  FINDINGS: Scattered large and small bowel gas is noted. Scattered fecal material is noted throughout the colon. This consistent with mild constipation. No obstructive changes are seen. Degenerative changes of lumbar spine are noted. No abnormal mass or abnormal calcifications are seen.  IMPRESSION: Changes of mild constipation.  No calculi are identified.   Electronically Signed   By: Inez Catalina M.D.   On: 09/12/2020 16:10   I have independently reviewed the films.  See HPI.   Assessment & Plan:    1. Microscopic hematuria - AUA guidelines: high risk - explained that she will need CT urogram and cystoscopy for work up per the guidelines to evaluate for possible GU malignancies - explained that a CT urogram consists of injecting contrast material into the veins which may result in an allergic reaction that may be life threatening (1:100,000)   - I have explained to the patient that they will  be scheduled for a cystoscopy in our office to evaluate their bladder.  The cystoscopy consists of passing a tube with a lens up through their urethra and into their urinary bladder.   We will inject the urethra  with a lidocaine gel prior to introducing the cystoscope to help with any discomfort during the procedure.   After the procedure, they might experience blood in the urine and discomfort with urination.  This will abate after the first few voids.  I have  encouraged the patient to increase water intake  during this time.  Patient denies any allergies to lidocaine.  - UA - urine culture - BUN + creatinine  2. Dysuria - urine sent for culture - cysto pending    Return for CT urogram and cystoscopy for microscopic hematuria/dysuria .  These notes generated with voice recognition software. I apologize for typographical errors.  Zara Council, PA-C  [facility] [address]  [place], [place] [address] [phone]

## 2020-09-12 ENCOUNTER — Ambulatory Visit
Admission: RE | Admit: 2020-09-12 | Discharge: 2020-09-12 | Disposition: A | Discharge status: Home / Self Care | Payer: Medicare Other | Source: Ambulatory Visit | Attending: Urology | Admitting: Urology

## 2020-09-12 ENCOUNTER — Other Ambulatory Visit: Payer: Self-pay

## 2020-09-12 ENCOUNTER — Ambulatory Visit (INDEPENDENT_AMBULATORY_CARE_PROVIDER_SITE_OTHER): Payer: Medicare Other | Admitting: Urology

## 2020-09-12 VITALS — BP 128/78 | HR 88 | Ht 68.0 in | Wt 154.0 lb

## 2020-09-12 DIAGNOSIS — R300 Dysuria: Secondary | ICD-10-CM

## 2020-09-12 DIAGNOSIS — R3129 Other microscopic hematuria: Secondary | ICD-10-CM

## 2020-09-12 LAB — URINALYSIS, COMPLETE
Bilirubin, UA: NEGATIVE
Glucose, UA: NEGATIVE
Ketones, UA: NEGATIVE
Nitrite, UA: NEGATIVE
Specific Gravity, UA: 1.015 (ref 1.005–1.030)
Urobilinogen, Ur: 0.2 mg/dL (ref 0.2–1.0)
pH, UA: 6 (ref 5.0–7.5)

## 2020-09-12 LAB — MICROSCOPIC EXAMINATION: Bacteria, UA: NONE SEEN

## 2020-09-12 MED ORDER — CEPHALEXIN 500 MG PO CAPS: 500.0000 mg | ORAL_CAPSULE | Freq: Two times a day (BID) | ORAL | 0 refills | Status: DC

## 2020-09-12 NOTE — Patient Instructions (Signed)
Cystoscopy Cystoscopy is a procedure that is used to help diagnose and sometimes treat conditions that affect the lower urinary tract. The lower urinary tract includes the bladder and the urethra. The urethra is the tube that drains urine from the bladder. Cystoscopy is done using a thin, tube-shaped instrument with a light and camera at the end (cystoscope). The cystoscope may be hard or flexible, depending on the goal of the procedure. The cystoscope is inserted through the urethra, into the bladder. Cystoscopy may be recommended if you have:  Urinary tract infections that keep coming back.  Blood in the urine (hematuria).  An inability to control when you urinate (urinary incontinence) or an overactive bladder.  Unusual cells found in a urine sample.  A blockage in the urethra, such as a urinary stone.  Painful urination.  An abnormality in the bladder found during an intravenous pyelogram (IVP) or CT scan. Cystoscopy may also be done to remove a sample of tissue to be examined under a microscope (biopsy). What are the risks? Generally, this is a safe procedure. However, problems may occur, including:  Infection.  Bleeding.  What happens during the procedure?  1. You will be given one or more of the following: ? A medicine to numb the area (local anesthetic). 2. The area around the opening of your urethra will be cleaned. 3. The cystoscope will be passed through your urethra into your bladder. 4. Germ-free (sterile) fluid will flow through the cystoscope to fill your bladder. The fluid will stretch your bladder so that your health care provider can clearly examine your bladder walls. 5. Your doctor will look at the urethra and bladder. 6. The cystoscope will be removed The procedure may vary among health care providers  What can I expect after the procedure? After the procedure, it is common to have: 1. Some soreness or pain in your abdomen and urethra. 2. Urinary symptoms.  These include: ? Mild pain or burning when you urinate. Pain should stop within a few minutes after you urinate. This may last for up to 1 week. ? A small amount of blood in your urine for several days. ? Feeling like you need to urinate but producing only a small amount of urine. Follow these instructions at home: General instructions  Return to your normal activities as told by your health care provider.   Do not drive for 24 hours if you were given a sedative during your procedure.  Watch for any blood in your urine. If the amount of blood in your urine increases, call your health care provider.  If a tissue sample was removed for testing (biopsy) during your procedure, it is up to you to get your test results. Ask your health care provider, or the department that is doing the test, when your results will be ready.  Drink enough fluid to keep your urine pale yellow.  Keep all follow-up visits as told by your health care provider. This is important. Contact a health care provider if you:  Have pain that gets worse or does not get better with medicine, especially pain when you urinate.  Have trouble urinating.  Have more blood in your urine. Get help right away if you:  Have blood clots in your urine.  Have abdominal pain.  Have a fever or chills.  Are unable to urinate. Summary  Cystoscopy is a procedure that is used to help diagnose and sometimes treat conditions that affect the lower urinary tract.  Cystoscopy is done using   a thin, tube-shaped instrument with a light and camera at the end.  After the procedure, it is common to have some soreness or pain in your abdomen and urethra.  Watch for any blood in your urine. If the amount of blood in your urine increases, call your health care provider.  If you were prescribed an antibiotic medicine, take it as told by your health care provider. Do not stop taking the antibiotic even if you start to feel better. This  information is not intended to replace advice given to you by your health care provider. Make sure you discuss any questions you have with your health care provider. Document Revised: 08/31/2018 Document Reviewed: 08/31/2018 Elsevier Patient Education  2020 Elsevier Inc.   

## 2020-09-13 ENCOUNTER — Encounter: Payer: Self-pay | Admitting: Urology

## 2020-09-13 LAB — CREATININE, SERUM
Creatinine, Ser: 0.8 mg/dL (ref 0.57–1.00)
GFR calc Af Amer: 77 mL/min/{1.73_m2} (ref >59)
GFR calc non Af Amer: 67 mL/min/{1.73_m2} (ref >59)

## 2020-09-18 LAB — CULTURE, URINE COMPREHENSIVE

## 2020-09-19 ENCOUNTER — Encounter: Payer: Self-pay | Admitting: Student in an Organized Health Care Education/Training Program

## 2020-09-19 ENCOUNTER — Telehealth: Payer: Self-pay | Admitting: Family Medicine

## 2020-09-19 NOTE — Telephone Encounter (Signed)
Spoke to patient and she states she just finished an ABX today. Per Sam it is ok to not take another ABX at this time. Patient states she is not having any urinary symptoms.

## 2020-09-19 NOTE — Telephone Encounter (Signed)
-----   Message from Carman Ching, [place] sent at 09/19/2020  4:36 PM EST ----- Please start her on Macrobid BID x5 days. ----- Message ----- From: Nell Range Lab Results In Sent: 09/12/2020   4:36 PM EST To: Harle Battiest, PA-C

## 2020-09-24 ENCOUNTER — Other Ambulatory Visit: Payer: Self-pay

## 2020-09-24 ENCOUNTER — Encounter: Payer: Self-pay | Admitting: Student in an Organized Health Care Education/Training Program

## 2020-09-24 ENCOUNTER — Ambulatory Visit
Payer: Medicare Other | Attending: Student in an Organized Health Care Education/Training Program | Admitting: Student in an Organized Health Care Education/Training Program

## 2020-09-24 DIAGNOSIS — G894 Chronic pain syndrome: Secondary | ICD-10-CM

## 2020-09-24 DIAGNOSIS — G8929 Other chronic pain: Secondary | ICD-10-CM

## 2020-09-24 DIAGNOSIS — M533 Sacrococcygeal disorders, not elsewhere classified: Secondary | ICD-10-CM

## 2020-09-24 DIAGNOSIS — M5481 Occipital neuralgia: Secondary | ICD-10-CM | POA: Diagnosis not present

## 2020-09-24 DIAGNOSIS — M47818 Spondylosis without myelopathy or radiculopathy, sacral and sacrococcygeal region: Secondary | ICD-10-CM | POA: Diagnosis not present

## 2020-09-24 NOTE — Assessment & Plan Note (Signed)
Having increased SI joint and piriformis pain.  Has responded favorably to previous SI joint injections.  Previous one done in November 2020.  Patient would like to repeat bilateral SI joint injection for SI joint arthritis and dysfunction.

## 2020-09-24 NOTE — Progress Notes (Signed)
Patient: Sarah [name]  Service Category: E/M  Provider: Gillis Santa, MD  DOB: 1932-06-21  DOS: 09/24/2020  Location: Office  MRN: [id number]  Setting: Ambulatory outpatient  Referring Provider: Sharyne Peach, MD  Type: Established Patient  Specialty: Interventional Pain Management  PCP: Sharyne Peach, MD  Location: Home  Delivery: TeleHealth     Virtual Encounter - Pain Management PROVIDER NOTE: Information contained herein reflects review and annotations entered in association with encounter. Interpretation of such information and data should be left to medically-trained personnel. Information provided to patient can be located elsewhere in the medical record under "Patient Instructions". Document created using STT-dictation technology, any transcriptional errors that may result from process are unintentional.    Contact & Pharmacy Preferred: [phone] Home: [phone] (home) Mobile: [phone] (mobile) E-mail: [email or web address]  [id number] [place] [address]Phone: [phone]: [phone] [facility] [address]Phone: [phone]: [phone]  Pre-screening  [name] "in-person" vs "virtual" encounter. She indicated preferring virtual for this encounter.   Reason COVID-19*  Social distancing based on CDC and AMA recommendations.   I contacted PEdwena Bundeon 09/24/2020 via telephone.      I clearly identified myself as BGillis Santa MD. I verified that I was speaking with the correct person using two identifiers (Name: PMarigene [name] and date of birth: [birth year].  Consent I sought verbal advanced consent from [name]. I informed Ms. GFlightof possible security and privacy concerns, risks, and limitations associated with  providing "not-in-person" medical evaluation and management services. I also informed Ms. GSedlakof the availability of "in-person" appointments. Finally, I informed her that there would be a charge for the virtual visit and that she could be  personally, fully or partially, financially responsible for it. Ms. GRomagnoliexpressed understanding and agreed to proceed.   Historic Elements   Ms. PTrinidy Mastersonis a 87y.o. year old, female patient evaluated today after our last contact on 08/13/2020. Ms. GRicketts has a past medical history of Benign essential HTN (09/21/2015), [place], [place], and Thyroid disease. She also  has a past surgical history that includes Abdominal hysterectomy and Breast biopsy (Bilateral). Ms. GGrandhas a current medication list which includes the following prescription(s): acetaminophen, beta carotene w/minerals, cephalexin, dicyclomine, duloxetine, estradiol, gabapentin, levothyroxine, lovastatin, lumigan, melatonin, montelukast, simvastatin, and trospium chloride. She  reports that she has never smoked. She has never used smokeless tobacco. She reports that she does not drink alcohol. No history on file for drug use. [name] allergic to codeine, hydrocodone, oxycodone, peanut-containing drug products, peanuts [peanut oil], penicillins, and tramadol.   HPI  Today, she is being contacted for a post-procedure assessment.  Significant pain relief after greater occipital nerve block #1.  Endorses reduction in posterior dominant headaches as well as neck pain.  Is now endorsing low back, bilateral hip, SI joint and buttock pain.  Has been over 14 months since her previous SI joint injection.  Patient would like to repeat.  Post-Procedure Evaluation  Procedure (08/13/2020):   Type: Diagnostic, Greater, Occipital Nerve Block  #1  Region: Posterolateral Cervical Level: Occipital Ridge   Laterality: Bilateral  Sedation: Please see nurses note.   Effectiveness during initial hour after procedure(Ultra-Short Term Relief): 0 %   Local  anesthetic used: Long-acting (4-6 hours) Effectiveness: Defined as any analgesic benefit obtained secondary to the administration of local anesthetics. This carries significant diagnostic value as to the etiological location, or anatomical origin, of the pain. Duration of benefit is expected to coincide with the duration of the local anesthetic used.  Effectiveness during initial 4-6 hours after procedure(Short-Term Relief): 0 %   Long-term benefit: Defined as any relief past the pharmacologic duration of the local anesthetics.  Effectiveness past the initial 6 hours after procedure(Long-Term Relief): 100 %   Current benefits: Defined as benefit that persist at this time.   Analgesia:  90-100% better Function: Sarah [name] reports improvement in function ROM: [name] reports improvement in ROM   Laboratory Chemistry Profile   Renal Lab Results  Component Value Date   BUN 23 (H) 09/14/2015   CREATININE 0.80 09/12/2020   GFRAA 77 09/12/2020   GFRNONAA 67 09/12/2020     Hepatic Lab Results  Component Value Date   AST 19 11/26/2015   ALT 15 11/26/2015   ALBUMIN 4.7 11/26/2015   ALKPHOS 64 11/26/2015     Electrolytes Lab Results  Component Value Date   NA 141 09/14/2015   K 3.5 09/14/2015   CL 107 09/14/2015   CALCIUM 9.7 09/14/2015   MG 2.2 09/14/2015     Bone No results found for: VD25OH, VD125OH2TOT, DK4461JU1, QQ2411OY4, 25OHVITD1, 25OHVITD2, 25OHVITD3, TESTOFREE, TESTOSTERONE   Inflammation (CRP: Acute Phase) (ESR: Chronic Phase) No results found for: CRP, ESRSEDRATE, LATICACIDVEN     Note: Above Lab results reviewed.  Imaging  DG Abd 1 View CLINICAL DATA:  Flank pain and hematuria  EXAM: ABDOMEN - 1 VIEW  COMPARISON:  09/27/2019  FINDINGS: Scattered large and small bowel gas is noted. Scattered fecal material is noted throughout the colon. This consistent with  mild constipation. No obstructive changes are seen. Degenerative changes of lumbar spine are noted. No abnormal mass or abnormal calcifications are seen.  IMPRESSION: Changes of mild constipation.  No calculi are identified.  Electronically Signed   By: Inez Catalina M.D.   On: 09/12/2020 16:10  Assessment  The primary encounter diagnosis was Bilateral occipital neuralgia. Diagnoses of Chronic SI joint pain, SI joint arthritis, and Chronic pain syndrome were also pertinent to this visit.  Plan of Care  Problem-specific:  Bilateral occipital neuralgia 100% pain relief after diagnostic bilateral greater occipital nerve block that was done on 08/13/2020.  Repeat as needed.  SI joint arthritis Having increased SI joint and piriformis pain.  Has responded favorably to previous SI joint injections.  Previous one done in November 2020.  Patient would like to repeat bilateral SI joint injection for SI joint arthritis and dysfunction.  Sarah [name] has a current medication list which includes the following long-term medication(s): dicyclomine, duloxetine, levothyroxine, lovastatin, montelukast, and simvastatin.  Pharmacotherapy (Medications Ordered): No orders of the defined types were placed in this encounter.  Orders:  Orders Placed This Encounter  Procedures  . GREATER OCCIPITAL NERVE BLOCK    Standing Status:   Standing    Number of Occurrences:   6    Standing Expiration Date:   09/24/2021    Scheduling Instructions:     Procedure: Occipital nerve block     Laterality: Bilateral     Sedation: Patient's choice.     Timeframe: PRN Procedure. Patient will call.    Order Specific Question:   Where will this procedure be performed?    Answer:   [facility]  .  SACROILIAC JOINT INJECTION    Standing Status:   Future    Standing Expiration Date:   10/25/2020    Scheduling Instructions:     Side: Bilateral     Sedation: Without     Timeframe: ASAP    Order  Specific Question:   Where will this procedure be performed?    Answer:   [facility]   Follow-up plan:   Return in about 2 weeks (around 10/08/2020) for B/L SI-J , without sedation.     Finds benefit with bilateral sacroiliac joint injection, previous one 03/14/2019 and then second 1 05/25/2019.  Also finds lumbar epidural steroid injections for when she has sciatic flare, consider repeating.  Previous one performed on 10/13/2018 at [facility].  Status post bilateral C4, C5, C6, C7 cervical facet medial branch nerve block on 03/05/2020, 05/21/20 for cervical facet arthropathy. Bilateral greater occipital nerve block 08/13/2020 helped significantly, repeat as needed.      Recent Visits Date Type Provider Dept  08/13/20 Procedure visit Gillis Santa, MD [facility]  07/30/20 Procedure visit Gillis Santa, MD [facility]  07/12/20 Office Visit Gillis Santa, MD [facility]  Showing recent visits within past 90 days and meeting all other requirements Today's Visits Date Type Provider Dept  09/24/20 Telemedicine Gillis Santa, MD [facility]  Showing today's visits and meeting all other requirements Future Appointments No visits were found meeting these conditions. Showing future appointments within next 90 days and meeting all other requirements  I discussed the assessment and treatment plan with the patient. The patient was provided an opportunity to ask questions and all were answered. The patient agreed with the plan and demonstrated an understanding of the instructions.  Patient advised to call back or seek an in-person evaluation if the symptoms or condition worsens.  Duration of encounter: 30 minutes.  Note by: Gillis Santa, MD Date: 09/24/2020; Time: 1:01 PM

## 2020-09-24 NOTE — Assessment & Plan Note (Signed)
100% pain relief after diagnostic bilateral greater occipital nerve block that was done on 08/13/2020.  Repeat as needed.

## 2020-09-27 ENCOUNTER — Ambulatory Visit
Admission: RE | Admit: 2020-09-27 | Discharge: 2020-09-27 | Disposition: A | Discharge status: Home / Self Care | Payer: Medicare Other | Source: Ambulatory Visit | Attending: Urology | Admitting: Urology

## 2020-09-27 ENCOUNTER — Other Ambulatory Visit: Payer: Self-pay

## 2020-09-27 DIAGNOSIS — R3129 Other microscopic hematuria: Secondary | ICD-10-CM | POA: Insufficient documentation

## 2020-09-27 MED ORDER — IOHEXOL 300 MG/ML  SOLN
150.0000 mL | Freq: Once | INTRAMUSCULAR | Status: AC | PRN
  Administered 2020-09-27: 125 mL via INTRAVENOUS

## 2020-10-02 ENCOUNTER — Other Ambulatory Visit: Payer: Self-pay

## 2020-10-02 ENCOUNTER — Encounter: Payer: Self-pay | Admitting: Urology

## 2020-10-02 ENCOUNTER — Ambulatory Visit (INDEPENDENT_AMBULATORY_CARE_PROVIDER_SITE_OTHER): Payer: Medicare Other | Admitting: Urology

## 2020-10-02 ENCOUNTER — Other Ambulatory Visit: Payer: Medicare Other | Admitting: Urology

## 2020-10-02 ENCOUNTER — Other Ambulatory Visit
Admission: RE | Admit: 2020-10-02 | Discharge: 2020-10-02 | Disposition: A | Discharge status: Home / Self Care | Payer: Medicare Other | Attending: Urology | Admitting: Urology

## 2020-10-02 VITALS — BP 121/78 | HR 89 | Ht 68.0 in | Wt 157.6 lb

## 2020-10-02 DIAGNOSIS — N390 Urinary tract infection, site not specified: Secondary | ICD-10-CM

## 2020-10-02 DIAGNOSIS — R3129 Other microscopic hematuria: Secondary | ICD-10-CM | POA: Insufficient documentation

## 2020-10-02 LAB — URINALYSIS, COMPLETE (UACMP) WITH MICROSCOPIC
Bilirubin Urine: NEGATIVE
Glucose, UA: NEGATIVE mg/dL
Hgb urine dipstick: NEGATIVE
Ketones, ur: NEGATIVE mg/dL
Nitrite: NEGATIVE
Protein, ur: NEGATIVE mg/dL
Specific Gravity, Urine: 1.01 (ref 1.005–1.030)
pH: 6 (ref 5.0–8.0)

## 2020-10-02 MED ORDER — LIDOCAINE HCL URETHRAL/MUCOSAL 2 % EX GEL
1.0000 "application " | Freq: Once | CUTANEOUS | Status: AC
  Administered 2020-10-02: 1 via URETHRAL

## 2020-10-02 NOTE — Progress Notes (Signed)
Cystoscopy Procedure Note:  Indication: Dysuria, rUTIs, microscopic hematuria  She has been doing well over the last few weeks after being treated for E. coli UTI on 09/12/2020.  After informed consent and discussion of the procedure and its risks, Sarah [name] was positioned and prepped in the standard fashion. Cystoscopy was performed with a flexible cystoscope. The urethra, bladder neck and entire bladder was visualized in a standard fashion. The ureteral orifices were visualized in their normal location and orientation.  Bladder mucosa grossly normal throughout, no abnormalities on retroflexion, no evidence of foreign body or other abnormalities.  Imaging: CT urogram with no abnormalities  Findings: Normal cystoscopy  Assessment and Plan: Continue vaginal estrogen cream, cranberry tablets, trospium RTC with same 3 months symptom check  Nickolas Madrid, MD 10/02/2020

## 2020-10-08 ENCOUNTER — Ambulatory Visit: Payer: Medicare Other | Admitting: Student in an Organized Health Care Education/Training Program

## 2020-10-09 ENCOUNTER — Other Ambulatory Visit: Payer: Self-pay

## 2020-10-09 ENCOUNTER — Ambulatory Visit (INDEPENDENT_AMBULATORY_CARE_PROVIDER_SITE_OTHER): Payer: Medicare Other | Admitting: Physician Assistant

## 2020-10-09 ENCOUNTER — Encounter: Payer: Self-pay | Admitting: Physician Assistant

## 2020-10-09 VITALS — BP 118/73 | HR 83 | Ht 68.0 in | Wt 155.0 lb

## 2020-10-09 DIAGNOSIS — R300 Dysuria: Secondary | ICD-10-CM | POA: Diagnosis not present

## 2020-10-09 MED ORDER — CIPROFLOXACIN HCL 250 MG PO TABS: 250.0000 mg | ORAL_TABLET | Freq: Two times a day (BID) | ORAL | 0 refills | Status: AC

## 2020-10-09 NOTE — Progress Notes (Signed)
In and Out Catheterization  Patient is present today for a I & O catheterization due to recurrent UTI. Patient was cleaned and prepped in a sterile fashion with betadine . A 14FR cath was inserted no complications were noted, 3ml of urine return was noted, urine was yellow in color. A clean urine sample was collected for UA. Bladder was drained  And catheter was removed with out difficulty.    Performed by: Bradly Bienenstock, CMA

## 2020-10-09 NOTE — Progress Notes (Unsigned)
10/09/2020 3:15 PM   Sarah [name] [birth year] [id number]  CC: Chief Complaint  Patient presents with  . Dysuria   HPI: Sarah [name] is a 87 y.o. female with PMH OAB on trospium, recurrent UTI on cranberry supplements, and vaginal atrophy on Estrace who presents today for evaluation of possible UTI.  She underwent cystoscopy with Dr. Diamantina Providence 7 days ago with no significant findings.  Today she reports a 2-day history of dysuria and chills.  She denies fever, nausea, vomiting, and gross hematuria.  She continues to take probiotics daily, cranberry supplements twice daily, and topical estrogen cream 3 times weekly.  She does not take baths and is not using vaginal douches.  She wipes front to back.  In-office catheterized UA today positive for trace intact blood and 2+ leukocyte esterase; urine microscopy with >30 WBCs/HPF and many bacteria.   PMH: Past Medical History:  Diagnosis Date  . Benign essential HTN 09/21/2015  . Cancer ([place])    Skin CA resected from Right eyelid and both legs.  . Seizures ([place])   . Thyroid disease     Surgical History: Past Surgical History:  Procedure Laterality Date  . ABDOMINAL HYSTERECTOMY    . BREAST BIOPSY Bilateral    neg    Home Medications:  Allergies as of 10/09/2020      Reactions   Codeine Other (See Comments)   Makes her disoriented   Hydrocodone Other (See Comments)   Patient states she was seeing things that weren't there and very disoriented.   Oxycodone Other (See Comments)   hallucinations   Peanut-containing Drug Products    Other reaction(s): Headache   Peanuts [peanut Oil] Other (See Comments)   Violent Migraines   Penicillins Nausea Only   Tramadol Other (See Comments)   Dizziness.      Medication List       Accurate as of October 09, 2020  3:15 PM. If you have any questions, ask your nurse or doctor.        STOP taking these medications   cephALEXin 500 MG capsule Commonly known as:  Keflex Stopped by: Debroah Loop, PA-C     TAKE these medications   acetaminophen 500 MG tablet Commonly known as: TYLENOL Take 1,000 mg by mouth every 6 (six) hours as needed.   beta carotene w/minerals tablet Take 1 tablet by mouth daily.   dicyclomine 10 MG capsule Commonly known as: BENTYL Take 10 mg by mouth 4 (four) times daily -  before meals and at bedtime.   DULoxetine 20 MG capsule Commonly known as: CYMBALTA Take 20 mg by mouth daily.   estradiol 0.1 MG/GM vaginal cream Commonly known as: ESTRACE VAGINAL Apply one pea-sized amount around the opening of the urethra three times weekly.   gabapentin 300 MG capsule Commonly known as: NEURONTIN Take by mouth.   latanoprost 0.005 % ophthalmic solution Commonly known as: XALATAN SMARTSIG:In Eye(s)   levothyroxine 100 MCG tablet Commonly known as: SYNTHROID Take 100 mcg by mouth daily.   lovastatin 20 MG tablet Commonly known as: MEVACOR Take 20 mg by mouth daily.   melatonin 1 MG Tabs tablet Take 10 mg by mouth once.   montelukast 10 MG tablet Commonly known as: SINGULAIR Take 10 mg by mouth at bedtime.   simvastatin 10 MG tablet Commonly known as: ZOCOR Take 10 mg by mouth at bedtime.   Trospium Chloride 60 MG Cp24 Take 1 capsule (60 mg total) by mouth daily.  Allergies:  Allergies  Allergen Reactions  . Codeine Other (See Comments)    Makes her disoriented  . Hydrocodone Other (See Comments)    Patient states she was seeing things that weren't there and very disoriented.  . Oxycodone Other (See Comments)    hallucinations  . Peanut-Containing Drug Products     Other reaction(s): Headache  . Peanuts [Peanut Oil] Other (See Comments)    Violent Migraines  . Penicillins Nausea Only  . Tramadol Other (See Comments)    Dizziness.    Family History: Family History  Problem Relation Age of Onset  . Breast cancer Mother 14  . Breast cancer Maternal Uncle 60  . Breast cancer  Maternal Aunt        mat great aunt  . Breast cancer Maternal Aunt 32  . Tracheal cancer Father   . Cancer Sister   . Brain cancer Sister   . Cancer Brother     Social History:   reports that she has never smoked. She has never used smokeless tobacco. She reports that she does not drink alcohol. No history on file for drug use.  Physical Exam: BP 118/73   Pulse 83   Ht 5\' 8"  (1.727 m)   Wt 155 lb (70.3 kg)   BMI 23.57 kg/m   Constitutional:  Alert and oriented, no acute distress, nontoxic appearing HEENT: [place], AT Cardiovascular: No clubbing, cyanosis, or edema Respiratory: Normal respiratory effort, no increased work of breathing Skin: No rashes, bruises or suspicious lesions Neurologic: Grossly intact, no focal deficits, moving all 4 extremities Psychiatric: Normal mood and affect  Laboratory Data: Results for orders placed or performed in visit on 10/09/20  Microscopic Examination   Urine  Result Value Ref Range   WBC, UA >30 (A) 0 - 5 /hpf   RBC 0-2 0 - 2 /hpf   Epithelial Cells (non renal) 0-10 0 - 10 /hpf   Bacteria, UA Many (A) None seen/Few  Urinalysis, Complete  Result Value Ref Range   Specific Gravity, UA 1.010 1.005 - 1.030   pH, UA 6.0 5.0 - 7.5   Color, UA Yellow Yellow   Appearance Ur Cloudy (A) Clear   Leukocytes,UA 2+ (A) Negative   Protein,UA Negative Negative/Trace   Glucose, UA Negative Negative   Ketones, UA Negative Negative   RBC, UA Trace (A) Negative   Bilirubin, UA Negative Negative   Urobilinogen, Ur 0.2 0.2 - 1.0 mg/dL   Nitrite, UA Negative Negative   Microscopic Examination See below:    Assessment & Plan:   1. Dysuria Symptom onset 5 days after cystoscopy.  UA today notable for pyuria and bacteriuria.  Will start empiric Cipro and send for culture for further evaluation.  Counseled patient to continue UTI prevention including cranberry supplements, probiotics, and vaginal estrogen.  She expressed understanding. - Urinalysis,  Complete - CULTURE, URINE COMPREHENSIVE - ciprofloxacin (CIPRO) 250 MG tablet; Take 1 tablet (250 mg total) by mouth 2 (two) times daily for 5 days.  Dispense: 10 tablet; Refill: 0   Return if symptoms worsen or fail to improve.  Debroah Loop, PA-C  [facility] [address] [place], [place] [address] [phone]

## 2020-10-10 ENCOUNTER — Encounter: Payer: Self-pay | Admitting: Student in an Organized Health Care Education/Training Program

## 2020-10-10 ENCOUNTER — Ambulatory Visit
Admission: RE | Admit: 2020-10-10 | Discharge: 2020-10-10 | Disposition: A | Discharge status: Home / Self Care | Payer: Medicare Other | Source: Ambulatory Visit | Attending: Student in an Organized Health Care Education/Training Program | Admitting: Student in an Organized Health Care Education/Training Program

## 2020-10-10 ENCOUNTER — Other Ambulatory Visit: Payer: Self-pay

## 2020-10-10 ENCOUNTER — Ambulatory Visit (HOSPITAL_BASED_OUTPATIENT_CLINIC_OR_DEPARTMENT_OTHER): Payer: Medicare Other | Admitting: Student in an Organized Health Care Education/Training Program

## 2020-10-10 VITALS — BP 120/74 | HR 83 | Temp 97.3°F | Resp 12 | Ht 68.0 in | Wt 143.1 lb

## 2020-10-10 DIAGNOSIS — Z888 Allergy status to other drugs, medicaments and biological substances status: Secondary | ICD-10-CM | POA: Insufficient documentation

## 2020-10-10 DIAGNOSIS — Z79890 Hormone replacement therapy (postmenopausal): Secondary | ICD-10-CM | POA: Insufficient documentation

## 2020-10-10 DIAGNOSIS — Z880 Allergy status to penicillin: Secondary | ICD-10-CM | POA: Insufficient documentation

## 2020-10-10 DIAGNOSIS — M549 Dorsalgia, unspecified: Secondary | ICD-10-CM | POA: Insufficient documentation

## 2020-10-10 DIAGNOSIS — G894 Chronic pain syndrome: Secondary | ICD-10-CM | POA: Insufficient documentation

## 2020-10-10 DIAGNOSIS — Z792 Long term (current) use of antibiotics: Secondary | ICD-10-CM | POA: Diagnosis not present

## 2020-10-10 DIAGNOSIS — M533 Sacrococcygeal disorders, not elsewhere classified: Secondary | ICD-10-CM

## 2020-10-10 DIAGNOSIS — Z91010 Allergy to peanuts: Secondary | ICD-10-CM | POA: Insufficient documentation

## 2020-10-10 DIAGNOSIS — M47818 Spondylosis without myelopathy or radiculopathy, sacral and sacrococcygeal region: Secondary | ICD-10-CM | POA: Diagnosis not present

## 2020-10-10 DIAGNOSIS — G8929 Other chronic pain: Secondary | ICD-10-CM

## 2020-10-10 DIAGNOSIS — Z79899 Other long term (current) drug therapy: Secondary | ICD-10-CM | POA: Insufficient documentation

## 2020-10-10 DIAGNOSIS — Z885 Allergy status to narcotic agent status: Secondary | ICD-10-CM | POA: Diagnosis not present

## 2020-10-10 LAB — URINALYSIS, COMPLETE
Bilirubin, UA: NEGATIVE
Glucose, UA: NEGATIVE
Ketones, UA: NEGATIVE
Nitrite, UA: NEGATIVE
Protein,UA: NEGATIVE
Specific Gravity, UA: 1.01 (ref 1.005–1.030)
Urobilinogen, Ur: 0.2 mg/dL (ref 0.2–1.0)
pH, UA: 6 (ref 5.0–7.5)

## 2020-10-10 LAB — MICROSCOPIC EXAMINATION: WBC, UA: >30 /hpf — AB (ref 0–5)

## 2020-10-10 MED ORDER — IOHEXOL 180 MG/ML  SOLN
10.0000 mL | Freq: Once | INTRAMUSCULAR | Status: AC
  Administered 2020-10-10: 10 mL via INTRA_ARTICULAR

## 2020-10-10 MED ORDER — LIDOCAINE HCL (PF) 2 % IJ SOLN
INTRAMUSCULAR | Status: AC
  Filled 2020-10-10: qty 10

## 2020-10-10 MED ORDER — LIDOCAINE HCL 2 % IJ SOLN
20.0000 mL | Freq: Once | INTRAMUSCULAR | Status: AC
  Administered 2020-10-10: 200 mg
  Filled 2020-10-10: qty 20

## 2020-10-10 MED ORDER — DEXAMETHASONE SODIUM PHOSPHATE 10 MG/ML IJ SOLN
INTRAMUSCULAR | Status: AC
  Filled 2020-10-10: qty 2

## 2020-10-10 MED ORDER — DEXAMETHASONE SODIUM PHOSPHATE 10 MG/ML IJ SOLN
10.0000 mg | Freq: Once | INTRAMUSCULAR | Status: AC
  Administered 2020-10-10: 10 mg

## 2020-10-10 MED ORDER — ROPIVACAINE HCL 2 MG/ML IJ SOLN
INTRAMUSCULAR | Status: AC
  Filled 2020-10-10: qty 20

## 2020-10-10 MED ORDER — ROPIVACAINE HCL 2 MG/ML IJ SOLN
9.0000 mL | Freq: Once | INTRAMUSCULAR | Status: AC
  Administered 2020-10-10: 9 mL via PERINEURAL

## 2020-10-10 NOTE — Progress Notes (Signed)
Safety precautions to be maintained throughout the outpatient stay will include: orient to surroundings, keep bed in low position, maintain call bell within reach at all times, provide assistance with transfer out of bed and ambulation.  

## 2020-10-10 NOTE — Progress Notes (Signed)
Patient's Name: Sarah [name]  MRN: [id number]  Referring Provider: Sharyne Peach, MD  DOB: 1931/09/25  PCP: Sharyne Peach, MD  DOS: 10/10/2020  Note by: Gillis Santa, MD  Service setting: Ambulatory outpatient  Specialty: Interventional Pain Management  Patient type: Established  Location: [facility] (AMB) Pain Management Facility  Visit type: Interventional Procedure   Primary Reason for Visit: Interventional Pain Management Treatment. CC: Back Pain (Low and SI joint area)  Procedure:          Anesthesia, Analgesia, Anxiolysis:  Type: Therapeutic Sacroiliac Joint Steroid Injection #1 for 2022 Region: Inferior Lumbosacral Region Level: PIIS (Posterior Inferior Iliac Spine) Laterality: Bilateral    Local Anesthetic: Lidocaine 1-2%  Position: Prone           Indications: 1. Chronic SI joint pain   2. SI joint arthritis   3. Chronic pain syndrome    Pain Score: Pre-procedure: 6 /10 Post-procedure: 0-No pain/10  Pre-op Assessment:  Sarah [name] is a 87 y.o. (year old), female patient, seen today for interventional treatment. She  has a past surgical history that includes Abdominal hysterectomy and Breast biopsy (Bilateral). Sarah [name] has a current medication list which includes the following prescription(s): acetaminophen, beta carotene w/minerals, ciprofloxacin, dicyclomine, duloxetine, estradiol, gabapentin, latanoprost, levothyroxine, lovastatin, melatonin, montelukast, simvastatin, and trospium chloride. Her primarily concern today is the Back Pain (Low and SI joint area)  Initial Vital Signs:  Pulse/HCG Rate: 83ECG Heart Rate: 83 Temp: (!) 97.3 F (36.3 C) Resp: 18 BP: (!) 114/91 SpO2: 95 %  BMI: Estimated body mass index is 21.76 kg/m as calculated from the following:   Height as of this encounter: 5\' 8"  (1.727 m).   Weight as of this encounter: 143 lb 1.6 oz (64.9 kg).  Risk Assessment: Allergies: Reviewed. She is allergic to codeine, hydrocodone, oxycodone,  peanut-containing drug products, peanuts [peanut oil], penicillins, and tramadol.  Allergy Precautions: None required Coagulopathies: Reviewed. None identified.  Blood-thinner therapy: None at this time Active Infection(s): Reviewed. None identified. Sarah [name] is afebrile  Site Confirmation: Sarah [name] was asked to confirm the procedure and laterality before marking the site Procedure checklist: Completed Consent: Before the procedure and under the influence of no sedative(s), amnesic(s), or anxiolytics, the patient was informed of the treatment options, risks and possible complications. To fulfill our ethical and legal obligations, as recommended by the American Medical Association's Code of Ethics, I have informed the patient of my clinical impression; the nature and purpose of the treatment or procedure; the risks, benefits, and possible complications of the intervention; the alternatives, including doing nothing; the risk(s) and benefit(s) of the alternative treatment(s) or procedure(s); and the risk(s) and benefit(s) of doing nothing. The patient was provided information about the general risks and possible complications associated with the procedure. These may include, but are not limited to: failure to achieve desired goals, infection, bleeding, organ or nerve damage, allergic reactions, paralysis, and death. In addition, the patient was informed of those risks and complications associated to the procedure, such as failure to decrease pain; infection; bleeding; organ or nerve damage with subsequent damage to sensory, motor, and/or autonomic systems, resulting in permanent pain, numbness, and/or weakness of one or several areas of the body; allergic reactions; (i.e.: anaphylactic reaction); and/or death. Furthermore, the patient was informed of those risks and complications associated with the medications. These include, but are not limited to: allergic reactions (i.e.: anaphylactic or  anaphylactoid reaction(s)); adrenal axis suppression; blood sugar elevation that in diabetics may result  in ketoacidosis or comma; water retention that in patients with history of congestive heart failure may result in shortness of breath, pulmonary edema, and decompensation with resultant heart failure; weight gain; swelling or edema; medication-induced neural toxicity; particulate matter embolism and blood vessel occlusion with resultant organ, and/or nervous system infarction; and/or aseptic necrosis of one or more joints. Finally, the patient was informed that Medicine is not an exact science; therefore, there is also the possibility of unforeseen or unpredictable risks and/or possible complications that may result in a catastrophic outcome. The patient indicated having understood very clearly. We have given the patient no guarantees and we have made no promises. Enough time was given to the patient to ask questions, all of which were answered to the patient's satisfaction. Sarah [name] has indicated that she wanted to continue with the procedure. Attestation: I, the ordering provider, attest that I have discussed with the patient the benefits, risks, side-effects, alternatives, likelihood of achieving goals, and potential problems during recovery for the procedure that I have provided informed consent. Date  Time: 10/10/2020 11:40 AM  Pre-Procedure Preparation:  Monitoring: As per clinic protocol. Respiration, ETCO2, SpO2, BP, heart rate and rhythm monitor placed and checked for adequate function Safety Precautions: Patient was assessed for positional comfort and pressure points before starting the procedure. Time-out: I initiated and conducted the "Time-out" before starting the procedure, as per protocol. The patient was asked to participate by confirming the accuracy of the "Time Out" information. Verification of the correct person, site, and procedure were performed and confirmed by me, the nursing  staff, and the patient. "Time-out" conducted as per Joint Commission's Universal Protocol (UP.01.01.01). Time: 1202  Description of Procedure:          Target Area: Inferior, posterior, aspect of the sacroiliac fissure Approach: Posterior, paraspinal, ipsilateral approach. Area Prepped: Entire Lower Lumbosacral Region Prepping solution: ChloraPrep (2% chlorhexidine gluconate and 70% isopropyl alcohol) Safety Precautions: Aspiration looking for blood return was conducted prior to all injections. At no point did we inject any substances, as a needle was being advanced. No attempts were made at seeking any paresthesias. Safe injection practices and needle disposal techniques used. Medications properly checked for expiration dates. SDV (single dose vial) medications used. Description of the Procedure: Protocol guidelines were followed. The patient was placed in position over the procedure table. The target area was identified and the area prepped in the usual manner. Skin & deeper tissues infiltrated with local anesthetic. Appropriate amount of time allowed to pass for local anesthetics to take effect. The procedure needle was advanced under fluoroscopic guidance into the sacroiliac joint until a firm endpoint was obtained. Proper needle placement secured. Negative aspiration confirmed. Solution injected in intermittent fashion, asking for systemic symptoms every 0.5cc of injectate. The needles were then removed and the area cleansed, making sure to leave some of the prepping solution back to take advantage of its long term bactericidal properties. Vitals:   10/10/20 1144 10/10/20 1201 10/10/20 1204 10/10/20 1209  BP: (!) 114/91 109/70 113/74 120/74  Pulse: 83     Resp: 18 14 10 12   Temp: (!) 97.3 F (36.3 C)     TempSrc: Temporal     SpO2: 95% 100% 99% 98%  Weight: 143 lb 1.6 oz (64.9 kg)     Height: 5\' 8"  (1.727 m)       Start Time: 1202 hrs. End Time: 1208 hrs. Materials:  Needle(s) Type:  Spinal Needle Gauge: 25G Length: 3.5-in Medication(s): Please see orders for  medications and dosing details. 5 cc solution made of 4 cc of 0.2% ropivacaine, 1 of Decadron 10 mg/cc.  2.5 cc injected intra-articular, 2.5 cc injected periarticular for left sacroiliac joint 5 cc solution made of 4 cc of 0.2% ropivacaine, 1 of Decadron 10 mg/cc.  2.5 cc injected intra-articular, 2.5 cc injected periarticular for right sacroiliac joint Total steroid dose: 20 mg Decadron Imaging Guidance (Non-Spinal):          Type of Imaging Technique: Fluoroscopy Guidance (Non-Spinal) Indication(s): Assistance in needle guidance and placement for procedures requiring needle placement in or near specific anatomical locations not easily accessible without such assistance. Exposure Time: Please see nurses notes. Contrast: Before injecting any contrast, we confirmed that the patient did not have an allergy to iodine, shellfish, or radiological contrast. Once satisfactory needle placement was completed at the desired level, radiological contrast was injected. Contrast injected under live fluoroscopy. No contrast complications. See chart for type and volume of contrast used. Fluoroscopic Guidance: I was personally present during the use of fluoroscopy. "Tunnel Vision Technique" used to obtain the best possible view of the target area. Parallax error corrected before commencing the procedure. "Direction-depth-direction" technique used to introduce the needle under continuous pulsed fluoroscopy. Once target was reached, antero-posterior, oblique, and lateral fluoroscopic projection used confirm needle placement in all planes. Images permanently stored in EMR. Interpretation: I personally interpreted the imaging intraoperatively. Adequate needle placement confirmed in multiple planes. Appropriate spread of contrast into desired area was observed. No evidence of afferent or efferent intravascular uptake. Permanent images saved into  the patient's record.  Antibiotic Prophylaxis:   Anti-infectives (From admission, onward)   None     Indication(s): None identified  Post-operative Assessment:  Post-procedure Vital Signs:  Pulse/HCG Rate: 8378 Temp: (!) 97.3 F (36.3 C) Resp: 12 BP: 120/74 SpO2: 98 %  EBL: None  Complications: No immediate post-treatment complications observed by team, or reported by patient.  Note: The patient tolerated the entire procedure well. A repeat set of vitals were taken after the procedure and the patient was kept under observation following institutional policy, for this type of procedure. Post-procedural neurological assessment was performed, showing return to baseline, prior to discharge. The patient was provided with post-procedure discharge instructions, including a section on how to identify potential problems. Should any problems arise concerning this procedure, the patient was given instructions to immediately contact us, at any time, without hesitation. In any case, we plan to contact the patient by telephone for a follow-up status report regarding this interventional procedure.  Comments:  No additional relevant information.  Plan of Care  Orders:  Orders Placed This Encounter  Procedures  . DG PAIN CLINIC C-ARM 1-60 MIN NO REPORT    Intraoperative interpretation by procedural physician at [place].    Standing Status:   Standing    Number of Occurrences:   1    Order Specific Question:   Reason for exam:    Answer:   Assistance in needle guidance and placement for procedures requiring needle placement in or near specific anatomical locations not easily accessible without such assistance.   Medications ordered for procedure: Meds ordered this encounter  Medications  . iohexol (OMNIPAQUE) 180 MG/ML injection 10 mL    Must be Myelogram-compatible. If not available, you may substitute with a water-soluble, non-ionic, hypoallergenic, myelogram-compatible  radiological contrast medium.  Marland Kitchen lidocaine (XYLOCAINE) 2 % (with pres) injection 400 mg  . ropivacaine (PF) 2 mg/mL (0.2%) (NAROPIN) injection 9 mL  .  dexamethasone (DECADRON) injection 10 mg  . dexamethasone (DECADRON) injection 10 mg   Medications administered: We administered iohexol, lidocaine, ropivacaine (PF) 2 mg/mL (0.2%), dexamethasone, and dexamethasone.  See the medical record for exact dosing, route, and time of administration.  Disposition: Discharge home  Discharge Date & Time: 10/10/2020; 1215 hrs.   Follow-up plan:   No follow-ups on file.     Future Appointments  Date Time Provider [place]  11/21/2020  3:20 PM Gillis Santa, MD [facility] None  12/31/2020  9:00 AM Phoebe Perch None   Primary Care Physician: Sharyne Peach, MD Location: [facility] Out[facility] Management Facility Note by: Gillis Santa, MD Date: 10/10/2020; Time: 12:30 PM  Disclaimer:  Medicine is not an exact science. The only guarantee in medicine is that nothing is guaranteed. It is important to note that the decision to proceed with this intervention was based on the information collected from the patient. The Data and conclusions were drawn from the patient's questionnaire, the interview, and the physical examination. Because the information was provided in large part by the patient, it cannot be guaranteed that it has not been purposely or unconsciously manipulated. Every effort has been made to obtain as much relevant data as possible for this evaluation. It is important to note that the conclusions that lead to this procedure are derived in large part from the available data. Always take into account that the treatment will also be dependent on availability of resources and existing treatment guidelines, considered by other Pain Management Practitioners as being common knowledge and practice, at the time of the intervention. For Medico-Legal purposes, it is also important  to point out that variation in procedural techniques and pharmacological choices are the acceptable norm. The indications, contraindications, technique, and results of the above procedure should only be interpreted and judged by a Board-Certified Interventional Pain Specialist with extensive familiarity and expertise in the same exact procedure and technique.

## 2020-10-11 ENCOUNTER — Telehealth: Payer: Self-pay | Admitting: *Deleted

## 2020-10-11 NOTE — Telephone Encounter (Signed)
No problems post procedure. 

## 2020-10-13 LAB — CULTURE, URINE COMPREHENSIVE

## 2020-10-17 ENCOUNTER — Other Ambulatory Visit: Payer: Self-pay | Admitting: Physician Assistant

## 2020-10-24 ENCOUNTER — Other Ambulatory Visit: Payer: Self-pay | Admitting: Neurology

## 2020-10-24 DIAGNOSIS — F09 Unspecified mental disorder due to known physiological condition: Secondary | ICD-10-CM

## 2020-10-24 DIAGNOSIS — R413 Other amnesia: Secondary | ICD-10-CM

## 2020-10-24 DIAGNOSIS — S0990XS Unspecified injury of head, sequela: Secondary | ICD-10-CM

## 2020-11-03 ENCOUNTER — Other Ambulatory Visit: Payer: Self-pay | Admitting: Physician Assistant

## 2020-11-03 ENCOUNTER — Ambulatory Visit
Admission: RE | Admit: 2020-11-03 | Discharge: 2020-11-03 | Disposition: A | Discharge status: Home / Self Care | Payer: Medicare Other | Source: Ambulatory Visit | Attending: Neurology | Admitting: Neurology

## 2020-11-03 ENCOUNTER — Other Ambulatory Visit: Payer: Self-pay

## 2020-11-03 DIAGNOSIS — R413 Other amnesia: Secondary | ICD-10-CM | POA: Diagnosis not present

## 2020-11-03 DIAGNOSIS — S0990XS Unspecified injury of head, sequela: Secondary | ICD-10-CM | POA: Insufficient documentation

## 2020-11-03 DIAGNOSIS — F09 Unspecified mental disorder due to known physiological condition: Secondary | ICD-10-CM | POA: Diagnosis present

## 2020-11-20 ENCOUNTER — Encounter: Payer: Self-pay | Admitting: Student in an Organized Health Care Education/Training Program

## 2020-11-21 ENCOUNTER — Ambulatory Visit
Payer: Medicare Other | Attending: Student in an Organized Health Care Education/Training Program | Admitting: Student in an Organized Health Care Education/Training Program

## 2020-11-21 ENCOUNTER — Other Ambulatory Visit: Payer: Self-pay

## 2020-11-21 ENCOUNTER — Encounter: Payer: Self-pay | Admitting: Student in an Organized Health Care Education/Training Program

## 2020-11-21 DIAGNOSIS — G894 Chronic pain syndrome: Secondary | ICD-10-CM | POA: Diagnosis not present

## 2020-11-21 DIAGNOSIS — M533 Sacrococcygeal disorders, not elsewhere classified: Secondary | ICD-10-CM | POA: Diagnosis not present

## 2020-11-21 DIAGNOSIS — G8929 Other chronic pain: Secondary | ICD-10-CM

## 2020-11-21 DIAGNOSIS — M47818 Spondylosis without myelopathy or radiculopathy, sacral and sacrococcygeal region: Secondary | ICD-10-CM | POA: Diagnosis not present

## 2020-11-21 NOTE — Progress Notes (Signed)
Patient: Sarah [name]  Service Category: E/M  Provider: Gillis Santa, MD  DOB: 04/21/1932  DOS: 11/21/2020  Location: Office  MRN: [id number]  Setting: Ambulatory outpatient  Referring Provider: Sharyne Peach, MD  Type: Established Patient  Specialty: Interventional Pain Management  PCP: Sarah Peach, MD  Location: Home  Delivery: TeleHealth     Virtual Encounter - Pain Management PROVIDER NOTE: Information contained herein reflects review and annotations entered in association with encounter. Interpretation of such information and data should be left to medically-trained personnel. Information provided to patient can be located elsewhere in the medical record under "Patient Instructions". Document created using STT-dictation technology, any transcriptional errors that may result from process are unintentional.    Contact & Pharmacy Preferred: [phone] Home: [phone] (home) Mobile: [phone] (mobile) E-mail: [email or web address]  [id number] [place] [address]Phone: [phone]: [phone] [place]Phone: [phone]: [phone]  Pre-screening  Ms. GLemaireoffered "in-person" vs "virtual" encounter. She indicated preferring virtual for this encounter.   Reason COVID-19*  Social distancing based on CDC and AMA recommendations.   I contacted PEdwena Bundeon 11/21/2020 via video conference.      I clearly identified myself as BGillis Santa MD. I verified that I was speaking with the correct person using two identifiers (Name: PEmpress [name] and date of birth: 1Jun 23, 1933.  Consent I sought verbal advanced consent from PEdwena [name] virtual visit interactions. I informed Ms. GBurklowof possible security and privacy concerns, risks, and limitations associated  with providing "not-in-person" medical evaluation and management services. I also informed Ms. GMathisonof the availability of "in-person" appointments. Finally, I informed her that there would be a charge for the virtual visit and that she could be  personally, fully or partially, financially responsible for it. Ms. GFentressexpressed understanding and agreed to proceed.   Historic Elements   [name] a 87y.o. year old, female patient evaluated today after our last contact on 10/10/2020. [name] has a past medical history of Benign essential HTN (09/21/2015), Cancer ([place], Seizures [place], and Thyroid disease. She also  has a past surgical history that includes Abdominal hysterectomy and Breast biopsy (Bilateral). [name] a current medication list which includes the following prescription(s): acetaminophen, beta carotene w/minerals, bimatoprost, dicyclomine, duloxetine, estradiol, gabapentin, levothyroxine, lovastatin, melatonin, simvastatin, latanoprost, montelukast, and trospium chloride. She  reports that she has never smoked. She has never used smokeless tobacco. She reports that she does not drink alcohol. No history on file for drug use. [name] allergic to codeine, hydrocodone, oxycodone, peanut-containing drug products, peanuts [peanut oil], penicillins, and tramadol.   HPI  Today, she is being contacted for a post-procedure assessment.   Post-Procedure Evaluation  Procedure (10/10/2020):   Type: Therapeutic Sacroiliac Joint Steroid Injection #1 for 2022 Region: Inferior Lumbosacral Region Level: PIIS (Posterior Inferior Iliac Spine) Laterality: Bilateral  Sedation: Please see nurses note.  Effectiveness during initial hour after procedure(Ultra-Short Term Relief): 75 %  Local anesthetic used: Long-acting (4-6 hours) Effectiveness: Defined as any analgesic benefit obtained secondary to the administration of local anesthetics. This carries significant  diagnostic value as to the etiological location, or anatomical origin, of the pain. Duration of benefit is expected to coincide with the duration of the local anesthetic used.  Effectiveness during initial 4-6 hours after procedure(Short-Term Relief): 75 %  Long-term benefit: Defined as any relief past the pharmacologic duration of the local anesthetics.  Effectiveness past the initial 6 hours after procedure(Long-Term Relief): 75 % (gradually returned after 2 weeks.)   Current benefits: Defined as benefit that persist at this time.   Analgesia:  Back to baseline Function: Back to baseline ROM: Back to baseline   Laboratory Chemistry Profile   Renal Lab Results  Component Value Date   BUN 23 (H) 09/14/2015   CREATININE 0.80 09/12/2020   GFRAA 77 09/12/2020   GFRNONAA 67 09/12/2020     Hepatic Lab Results  Component Value Date   AST 19 11/26/2015   ALT 15 11/26/2015   ALBUMIN 4.7 11/26/2015   ALKPHOS 64 11/26/2015     Electrolytes Lab Results  Component Value Date   NA 141 09/14/2015   K 3.5 09/14/2015   CL 107 09/14/2015   CALCIUM 9.7 09/14/2015   MG 2.2 09/14/2015     Bone No results found for: VD25OH, VD125OH2TOT, RW4315QM0, QQ7619JK9, 25OHVITD1, 25OHVITD2, 25OHVITD3, TESTOFREE, TESTOSTERONE   Inflammation (CRP: Acute Phase) (ESR: Chronic Phase) No results found for: CRP, ESRSEDRATE, LATICACIDVEN     Note: Above Lab results reviewed.  Imaging  MR BRAIN WO CONTRAST CLINICAL DATA:  Posterior headaches since fall July 2021  EXAM: MRI HEAD WITHOUT CONTRAST  TECHNIQUE: Multiplanar, multiecho pulse sequences of the brain and surrounding structures were obtained without intravenous contrast.  COMPARISON:  None.  FINDINGS: Brain: No acute or subacute infarction, hemorrhage, hydrocephalus, or mass lesion. No posttraumatic finding such is collection, atrophy, or chronic blood products. Chronic small vessel ischemic type change mainly in the periventricular  white matter and pons, mild for age. Age normal brain volume.  Vascular: Preserved flow voids. Mild vertebrobasilar tortuosity and brainstem flattening.  Skull and upper cervical spine: Upper cervical spine and TMJ degenerative spurring.  Sinuses/Orbits: Bilateral cataract resection.  Negative sinuses.  IMPRESSION: Unremarkable study for age. No specific finding to explain headache.  Electronically Signed   By: Monte Fantasia M.D.   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 Diagnoses of Chronic SI joint pain and Chronic pain syndrome were also pertinent to this visit.  Plan of Care  Ms. Timothea Bodenheimer has a current medication list which includes the following long-term medication(s): dicyclomine, duloxetine, levothyroxine, lovastatin, simvastatin, and montelukast.  Orders:  Orders Placed This Encounter  Procedures  . SACROILIAC JOINT INJECTION    Scheduling timeframe: (PRN procedure) Ms. Ligman will call when needed. Clinical indication: Low back pain, w/ or w/o groin pain. Sacroiliac joint pain Sedation: Usually done with sedation. (May be done without sedation if so desired by patient.) Requirements: NPO x 8 hrs.; Driver; Stop blood thinners.    Standing Status:   Standing    Number of Occurrences:   1    Standing Expiration Date:   11/21/2021    Scheduling Instructions:     Side: Bilateral     Sedation: Patient's choice.    Order Specific Question:   Where will this procedure be performed?    Answer:   [facility]   Follow-up plan:   No follow-ups on file.     Finds benefit with bilateral sacroiliac joint injection, previous one 03/14/2019 and then second 1 05/25/2019.  Also finds lumbar epidural steroid injections for when she has sciatic flare, consider repeating.  Previous one performed on 10/13/2018 at [facility].  Status post bilateral C4, C5, C6, C7 cervical facet medial branch nerve block on 03/05/2020,  05/21/20 for cervical facet arthropathy. Bilateral greater occipital nerve block 08/13/2020 helped significantly, repeat as needed.       Recent Visits Date Type Provider Dept  10/10/20 Procedure visit Sarah Santa, MD [facility]  09/24/20 Telemedicine Sarah Santa, MD [facility]  Showing recent visits within past 90 days and meeting all other requirements Today's Visits Date Type Provider Dept  11/21/20 Telemedicine Sarah Santa, MD [facility]  Showing today's visits and meeting all other requirements Future Appointments No visits were found meeting these conditions. Showing future appointments within next 90 days and meeting all other requirements  I discussed the assessment and treatment plan with the patient. The patient was provided an opportunity to ask questions and all were answered. The patient agreed with the plan and demonstrated an understanding of the instructions.  Patient advised to call back or seek an in-person evaluation if the symptoms or condition worsens.  Duration of encounter: 15 minutes.  Note by: Sarah Santa, MD Date: 11/21/2020; Time: 3:32 PM

## 2020-12-11 ENCOUNTER — Other Ambulatory Visit: Payer: Self-pay

## 2020-12-11 ENCOUNTER — Encounter: Payer: Self-pay | Admitting: Physician Assistant

## 2020-12-11 ENCOUNTER — Ambulatory Visit (INDEPENDENT_AMBULATORY_CARE_PROVIDER_SITE_OTHER): Payer: Medicare Other | Admitting: Physician Assistant

## 2020-12-11 VITALS — BP 110/76 | HR 108 | Ht 68.0 in | Wt 154.0 lb

## 2020-12-11 DIAGNOSIS — R300 Dysuria: Secondary | ICD-10-CM

## 2020-12-11 LAB — URINALYSIS, COMPLETE
Bilirubin, UA: NEGATIVE
Glucose, UA: NEGATIVE
Ketones, UA: NEGATIVE
Nitrite, UA: NEGATIVE
Protein,UA: NEGATIVE
Specific Gravity, UA: 1.015 (ref 1.005–1.030)
Urobilinogen, Ur: 0.2 mg/dL (ref 0.2–1.0)
pH, UA: 7 (ref 5.0–7.5)

## 2020-12-11 LAB — MICROSCOPIC EXAMINATION: WBC, UA: >30 /hpf — AB (ref 0–5)

## 2020-12-11 MED ORDER — SULFAMETHOXAZOLE-TRIMETHOPRIM 800-160 MG PO TABS: 1.0000 | ORAL_TABLET | Freq: Two times a day (BID) | ORAL | 0 refills | Status: AC

## 2020-12-11 NOTE — Progress Notes (Signed)
12/11/2020 5:23 PM   Sarah [name] 05-Apr-1932 [id number]  CC: Chief Complaint  Patient presents with  . Dysuria    HPI: Sarah [name] is a 87 y.o. female with PMH OAB on trospium, recurrent UTI on cranberry supplements, and vaginal atrophy on Estrace who presents today for evaluation of possible UTI.  Today she reports a 2-day history of dysuria, lower abdominal pain, and chills with urination.  She denies fever, nausea, or vomiting.  In-office UA today positive for trace lysed blood and 2+ leukocyte esterase; urine microscopy with >30 WBCs/HPF and moderate bacteria.   PMH: Past Medical History:  Diagnosis Date  . Benign essential HTN 09/21/2015  . Cancer ([place])    Skin CA resected from Right eyelid and both legs.  . Seizures ([place])   . Thyroid disease     Surgical History: Past Surgical History:  Procedure Laterality Date  . ABDOMINAL HYSTERECTOMY    . BREAST BIOPSY Bilateral    neg    Home Medications:  Allergies as of 12/11/2020      Reactions   Codeine Other (See Comments)   Makes her disoriented   Hydrocodone Other (See Comments)   Patient states she was seeing things that weren't there and very disoriented.   Oxycodone Other (See Comments)   hallucinations   Peanut-containing Drug Products    Other reaction(s): Headache   Peanuts [peanut Oil] Other (See Comments)   Violent Migraines   Penicillins Nausea Only   Tramadol Other (See Comments)   Dizziness.      Medication List       Accurate as of December 11, 2020  5:23 PM. If you have any questions, ask your nurse or doctor.        acetaminophen 500 MG tablet Commonly known as: TYLENOL Take 1,000 mg by mouth every 6 (six) hours as needed.   beta carotene w/minerals tablet Take 1 tablet by mouth daily.   bimatoprost 0.03 % ophthalmic solution Commonly known as: LUMIGAN Place 1 drop into both eyes at bedtime.   dicyclomine 10 MG capsule Commonly known as: BENTYL Take 10 mg  by mouth 4 (four) times daily -  before meals and at bedtime.   DULoxetine 20 MG capsule Commonly known as: CYMBALTA Take 20 mg by mouth daily.   estradiol 0.1 MG/GM vaginal cream Commonly known as: ESTRACE VAGINAL Apply one pea-sized amount around the opening of the urethra three times weekly.   gabapentin 300 MG capsule Commonly known as: NEURONTIN Take by mouth 4 (four) times daily.   latanoprost 0.005 % ophthalmic solution Commonly known as: XALATAN   levothyroxine 100 MCG tablet Commonly known as: SYNTHROID Take 100 mcg by mouth daily.   lovastatin 20 MG tablet Commonly known as: MEVACOR Take 20 mg by mouth daily.   melatonin 1 MG Tabs tablet Take 10 mg by mouth once.   montelukast 10 MG tablet Commonly known as: SINGULAIR Take 10 mg by mouth at bedtime.   simvastatin 10 MG tablet Commonly known as: ZOCOR Take 10 mg by mouth at bedtime.   sulfamethoxazole-trimethoprim 800-160 MG tablet Commonly known as: BACTRIM DS Take 1 tablet by mouth 2 (two) times daily for 5 days. Started by: Debroah Loop, PA-C   Trospium Chloride 60 MG Cp24 TAKE 1 CAPSULE BY MOUTH EVERY DAY       Allergies:  Allergies  Allergen Reactions  . Codeine Other (See Comments)    Makes her disoriented  . Hydrocodone Other (See Comments)  Patient states she was seeing things that weren't there and very disoriented.  . Oxycodone Other (See Comments)    hallucinations  . Peanut-Containing Drug Products     Other reaction(s): Headache  . Peanuts [Peanut Oil] Other (See Comments)    Violent Migraines  . Penicillins Nausea Only  . Tramadol Other (See Comments)    Dizziness.    Family History: Family History  Problem Relation Age of Onset  . Breast cancer Mother 35  . Breast cancer Maternal Uncle 60  . Breast cancer Maternal Aunt        mat great aunt  . Breast cancer Maternal Aunt 19  . Tracheal cancer Father   . Cancer Sister   . Brain cancer Sister   . Cancer  Brother     Social History:   reports that she has never smoked. She has never used smokeless tobacco. She reports that she does not drink alcohol. No history on file for drug use.  Physical Exam: BP 110/76   Pulse (!) 108   Ht 5\' 8"  (1.727 m)   Wt 154 lb (69.9 kg)   BMI 23.42 kg/m   Constitutional:  Alert and oriented, no acute distress, nontoxic appearing HEENT: [place], AT Cardiovascular: No clubbing, cyanosis, or edema Respiratory: Normal respiratory effort, no increased work of breathing Skin: No rashes, bruises or suspicious lesions Neurologic: Grossly intact, no focal deficits, moving all 4 extremities Psychiatric: Normal mood and affect  Laboratory Data: Results for orders placed or performed in visit on 12/11/20  Microscopic Examination   Urine  Result Value Ref Range   WBC, UA >30 (A) 0 - 5 /hpf   RBC 0-2 0 - 2 /hpf   Epithelial Cells (non renal) 0-10 0 - 10 /hpf   Bacteria, UA Moderate (A) None seen/Few  Urinalysis, Complete  Result Value Ref Range   Specific Gravity, UA 1.015 1.005 - 1.030   pH, UA 7.0 5.0 - 7.5   Color, UA Yellow Yellow   Appearance Ur Cloudy (A) Clear   Leukocytes,UA 2+ (A) Negative   Protein,UA Negative Negative/Trace   Glucose, UA Negative Negative   Ketones, UA Negative Negative   RBC, UA Trace (A) Negative   Bilirubin, UA Negative Negative   Urobilinogen, Ur 0.2 0.2 - 1.0 mg/dL   Nitrite, UA Negative Negative   Microscopic Examination See below:    Assessment & Plan:   1. Dysuria UA grossly infected today, will start empiric Bactrim and send for culture for further evaluation.  Patient expressed understanding. - Urinalysis, Complete - CULTURE, URINE COMPREHENSIVE - sulfamethoxazole-trimethoprim (BACTRIM DS) 800-160 MG tablet; Take 1 tablet by mouth 2 (two) times daily for 5 days.  Dispense: 10 tablet; Refill: 0  Return in about 3 months (around 03/13/2021) for rUTI follow-up.  Debroah Loop, PA-C  [facility] [address], [place],  [address] [phone]

## 2020-12-14 LAB — CULTURE, URINE COMPREHENSIVE

## 2020-12-17 ENCOUNTER — Telehealth: Payer: Self-pay

## 2020-12-17 DIAGNOSIS — R300 Dysuria: Secondary | ICD-10-CM

## 2020-12-17 MED ORDER — NITROFURANTOIN MONOHYD MACRO 100 MG PO CAPS: 100.0000 mg | ORAL_CAPSULE | Freq: Two times a day (BID) | ORAL | 0 refills | Status: DC

## 2020-12-17 NOTE — Telephone Encounter (Signed)
-----   Message from Debroah Loop, [place] sent at 12/17/2020  2:32 PM EDT ----- Please inform the patient that her urine culture has resulted with a bacteria that is resistant to the Bactrim I prescribed.  Please send in Macrobid 100 mg twice daily x5 days as an alternative. ----- Message ----- From: Interface, [facility] Lab Results In Sent: 12/11/2020   4:37 PM EDT To: Debroah Loop, PA-C

## 2020-12-17 NOTE — Telephone Encounter (Signed)
OK per DPR, [facility] notifying patient of ABX change, [facility] [place].

## 2020-12-20 ENCOUNTER — Telehealth: Payer: Self-pay

## 2020-12-20 ENCOUNTER — Encounter: Payer: Self-pay | Admitting: Physician Assistant

## 2020-12-20 MED ORDER — CEFUROXIME AXETIL 250 MG PO TABS: 250.0000 mg | ORAL_TABLET | Freq: Two times a day (BID) | ORAL | 0 refills | Status: AC

## 2020-12-20 NOTE — Telephone Encounter (Signed)
Pt LVM on triage line stating that she is unable to tolerate Macrobid, it has caused her to have blurry vision and feels like she "cannot breathe" please advise.

## 2020-12-20 NOTE — Telephone Encounter (Signed)
Please counsel her to stop Macrobid.  I have sent in cefuroxime 250 mg twice daily x5 days [facility] [place] as an alternative.

## 2020-12-20 NOTE — Telephone Encounter (Signed)
Counseled patient to stop Macrobid, advised her to pick up and start new ABX. Patient verbalized understanding.

## 2020-12-31 ENCOUNTER — Ambulatory Visit: Payer: Self-pay | Admitting: Physician Assistant

## 2021-01-02 ENCOUNTER — Ambulatory Visit: Payer: Self-pay | Admitting: Urology

## 2021-01-16 NOTE — Progress Notes (Signed)
01/17/2021 10:05 PM   Sarah [name] 1931/10/24 [id number]  Referring provider: Sharyne Peach, MD [address],  [place] [address]  Chief Complaint  Patient presents with  . Dysuria   Urological history 1. OAB - on trospium XL 60 mg daily  -pelvic sling > 20 years ago  2. Vaginal atrophy - could not tolerate vaginal estrogen cream  3. High risk hematuria -non-smoker -CTU 09/2020 NED -cysto 09/2020 NED -UA negative for micro heme  4. rUTI's -contributing factors of age, vaginal atrophy, loose stools and constipation -documented positive urine cultures over the last year  E.coli resistant to ampicillin, gentamicin, tetracycline and  trimethoprim/sulfa on 12/11/2020  Pan sensitive E.coli on 10/09/2020  E.coli resistant to trimethoprim/sulfa on 09/12/2020  Multiple species on 08/13/2020  E.coli resistant ampicillin, ampicillin/sulbactam, cefazolin,  gentamicin, tetracycline and trimethoprim/sulfamethoxazole on  03/25/2020  Pan sensitive E.coli 03/09/2020 -managed with cranberry tablets and vaginal estrogen cream  HPI: Sarah [name] is a 87 y.o. who presets today for dysuria.    She started to experience dysuria, frequency, urgency and lower abdominal pain the day before yesterday.    UA 11-30 WBC's and a few bacteria.    Patient denies any modifying or aggravating factors.  Patient denies any gross hematuria or flank pain.  Patient denies any fevers, chills, nausea or vomiting.   She continues to take cranberry tablets, cranberry juice and applying the vaginal estrogen cream.     PMH: Past Medical History:  Diagnosis Date  . Benign essential HTN 09/21/2015  . Cancer ([place])    Skin CA resected from Right eyelid and both legs.  . Seizures ([place])   . Thyroid disease     Surgical History: Past Surgical History:  Procedure Laterality Date  . ABDOMINAL HYSTERECTOMY    . BREAST BIOPSY Bilateral    neg    Home Medications:   Allergies as of 01/17/2021      Reactions   Macrobid [nitrofurantoin] Shortness Of Breath   Codeine Other (See Comments)   Makes her disoriented   Hydrocodone Other (See Comments)   Patient states she was seeing things that weren't there and very disoriented.   Oxycodone Other (See Comments)   hallucinations   Peanut-containing Drug Products    Other reaction(s): Headache   Peanuts [peanut Oil] Other (See Comments)   Violent Migraines   Penicillins Nausea Only   Tramadol Other (See Comments)   Dizziness.      Medication List       Accurate as of January 17, 2021 11:59 PM. If you have any questions, ask your nurse or doctor.        acetaminophen 500 MG tablet Commonly known as: TYLENOL Take 1,000 mg by mouth every 6 (six) hours as needed.   beta carotene w/minerals tablet Take 1 tablet by mouth daily.   bimatoprost 0.03 % ophthalmic solution Commonly known as: LUMIGAN Place 1 drop into both eyes at bedtime.   Lumigan 0.01 % Soln Generic drug: bimatoprost SMARTSIG:1 Drop(s) In Eye(s) Every Evening   ciprofloxacin 250 MG tablet Commonly known as: Cipro Take 1 tablet (250 mg total) by mouth 2 (two) times daily. Started by: Zara Council, PA-C   dicyclomine 10 MG capsule Commonly known as: BENTYL Take 10 mg by mouth 4 (four) times daily -  before meals and at bedtime.   DULoxetine 20 MG capsule Commonly known as: CYMBALTA Take 20 mg by mouth daily.   estradiol 0.1 MG/GM vaginal cream Commonly  known as: ESTRACE VAGINAL Apply one pea-sized amount around the opening of the urethra three times weekly.   gabapentin 300 MG capsule Commonly known as: NEURONTIN Take by mouth 4 (four) times daily.   latanoprost 0.005 % ophthalmic solution Commonly known as: XALATAN   levothyroxine 100 MCG tablet Commonly known as: SYNTHROID Take 100 mcg by mouth daily.   lovastatin 20 MG tablet Commonly known as: MEVACOR Take 20 mg by mouth daily.   melatonin 1 MG Tabs  tablet Take 10 mg by mouth once.   montelukast 10 MG tablet Commonly known as: SINGULAIR Take 10 mg by mouth at bedtime.   nortriptyline 10 MG capsule Commonly known as: PAMELOR Take 10 mg by mouth at bedtime.   polyethylene glycol powder 17 GM/SCOOP powder Commonly known as: GLYCOLAX/MIRALAX Take by mouth.   Probiotic (Lactobacillus) Caps Take by mouth.   simvastatin 10 MG tablet Commonly known as: ZOCOR Take 10 mg by mouth at bedtime.   Trospium Chloride 60 MG Cp24 TAKE 1 CAPSULE BY MOUTH EVERY DAY       Allergies:  Allergies  Allergen Reactions  . Macrobid [Nitrofurantoin] Shortness Of Breath  . Codeine Other (See Comments)    Makes her disoriented  . Hydrocodone Other (See Comments)    Patient states she was seeing things that weren't there and very disoriented.  . Oxycodone Other (See Comments)    hallucinations  . Peanut-Containing Drug Products     Other reaction(s): Headache  . Peanuts [Peanut Oil] Other (See Comments)    Violent Migraines  . Penicillins Nausea Only  . Tramadol Other (See Comments)    Dizziness.    Family History: Family History  Problem Relation Age of Onset  . Breast cancer Mother 44  . Breast cancer Maternal Uncle 60  . Breast cancer Maternal Aunt        mat great aunt  . Breast cancer Maternal Aunt 33  . Tracheal cancer Father   . Cancer Sister   . Brain cancer Sister   . Cancer Brother     Social History:  reports that she has never smoked. She has never used smokeless tobacco. She reports that she does not drink alcohol. No history on file for drug use.  ROS: Pertinent ROS in HPI  Physical Exam: BP 109/69   Pulse 96   Ht 5' 8"  (1.727 m)   Wt 155 lb (70.3 kg)   BMI 23.57 kg/m   Constitutional:  Well nourished. Alert and oriented, No acute distress. HEENT: [place] AT, mask in place.  Trachea midline Cardiovascular: No clubbing, cyanosis, or edema. Respiratory: Normal respiratory effort, no increased work of  breathing. Neurologic: Grossly intact, no focal deficits, moving all 4 extremities. Psychiatric: Normal mood and affect.   Laboratory Data: T. Pallidum Antibody (Syphilis) Non-Reactive Non-Reactive   Resulting Agency  [facility]  Specimen Collected: 10/05/20 14:21 Last Resulted: 10/05/20 21:54  Received From: [facility]  Result Received: 12/20/20 11:35   HIV 1&2 Antibody/Antigen NonReactive NonReactive   Resulting Agency  [facility]   Narrative Performed by [facility] testing for HIV 1&2 Antibody and HIV-1 Antigen.  In children less than 56 years of age, HIV antibody results require clinical correlation. Neither (+) or (-) results are absolutely conclusive. Specimen Collected: 10/05/20 14:21 Last Resulted: 10/06/20 00:01  Received From: [facility]  Result Received: 12/20/20 11:35   WBC (White Blood Cell Count) 3.2 - 9.8 x10^9/L 4.4  Hemoglobin 12.0 - 15.5 g/dL 12.3   Hematocrit 35.0 - 45.0 % 36.5   Platelets 150 - 450 x10^9/L 192   MCV (Mean Corpuscular Volume) 80 - 98 fL 92   MCH (Mean Corpuscular Hemoglobin) 26.5 - 34.0 pg 31.0   MCHC (Mean Corpuscular Hemoglobin Concentration) 31.4 - 36.0 % 33.7   RBC (Red Blood Cell Count) 3.77 - 5.16 x10^12/L 3.97   RDW-CV (Red Cell Distribution Width) 11.5 - 14.5 % 13.4   MPV (Mean Platelet Volume) 7.2 - 11.7 fL 9.2   Resulting Agency  [facility]  Specimen Collected: 10/05/20 14:21 Last Resulted: 10/05/20 14:29  Received From: [facility]  Result Received: 12/20/20 11:35   Sodium 135 - 145 mmol/L 140   Potassium 3.5 - 5.0 mmol/L 4.2   Chloride 98 - 108 mmol/L 106   Carbon Dioxide (CO2) 21 - 30 mmol/L 25   Urea Nitrogen (BUN) 7 - 20 mg/dL 14   Creatinine 0.4 - 1.0 mg/dL 1.0   Glucose 70 - 140 mg/dL 95   Comment: Interpretive Data:  Above is the NONFASTING reference range.   Below are the FASTING  reference ranges:  NORMAL:   70-99 mg/dL  PREDIABETES: 100-125 mg/dL  DIABETES:  > 125 mg/dL   Calcium 8.7 - 10.2 mg/dL 9.7   AST (Aspartate Aminotransferase) 15 - 41 U/L 21   ALT (Alanine Aminotransferase) 14 - 54 U/L 20   Bilirubin, Total 0.4 - 1.5 mg/dL 0.7   Alk Phos (Alkaline Phosphatase) 24 - 110 U/L 64   Albumin 3.5 - 4.8 g/dL 3.8   Protein, Total 6.2 - 8.1 g/dL 6.3   Anion Gap 3 - 12 mmol/L 9   BUN/CREA Ratio 6 - 27 14   Glomerular Filtration Rate (eGFR)  mL/min/1.73sq m 51   Comment:   NON-Modified eGFR: 51 mL/min/1.73sq m .  We recommend using this value for referral decisions.   Modified eGFR : 59 mL/min/1.73sq m .  This eGFR estimates kidney function using the CKD-Epi equation that increases eGFR by 16% for patients identified as Black/African-American in the medical record. This value was previously reported as the single eGFR before 03/06/2020.   Interpretive Ranges for eGFR(CKD-EPI):   eGFR:       > 60 mL/min/1.73 sq m - Normal  eGFR:       30 - 59 mL/min/1.73 sq m - Moderately Decreased  eGFR:       15 - 29 mL/min/1.73 sq m - Severely Decreased  eGFR:       < 15 mL/min/1.73 sq m - Kidney Failure   Note: These eGFR calculations do not apply in acute situations  when eGFR is changing rapidly or in patients on dialysis.   Resulting Agency  [facility]  Specimen Collected: 10/05/20 14:21 Last Resulted: 10/05/20 21:10  Received From: [facility]  Result Received: 12/20/20 11:35   Urinalysis Component     Latest Ref Rng & Units 01/17/2021          Specific Gravity, UA     1.005 - 1.030 1.025  pH, UA     5.0 - 7.5 5.5  Color, UA     Yellow Yellow  Appearance Ur     Clear Cloudy (A)  Leukocytes,UA     Negative 1+ (A)  Protein,UA     Negative/Trace Negative  Glucose, UA     Negative Negative  Ketones, UA     Negative Negative  RBC,  UA     Negative Negative  Bilirubin, UA      Negative Negative  Urobilinogen, Ur     0.2 - 1.0 mg/dL 1.0  Nitrite, UA     Negative Negative  Microscopic Examination      See below:   Component     Latest Ref Rng & Units 01/17/2021          WBC, UA     0 - 5 /hpf 11-30 (A)  RBC     0 - 2 /hpf 0-2  Epithelial Cells (non renal)     0 - 10 /hpf 0-10  Bacteria, UA     None seen/Few Few  I have reviewed the labs.   Pertinent Imaging: No new imaging since last visit  I have independently reviewed the films.  See HPI.   Assessment & Plan:    1. rUTI's - criteria for recurrent UTI has been met with 2 or more infections in 6 months or 3 or greater infections in one year  - patient is instructed to increase their water intake until the urine is pale yellow or clear (10 to 12 cups daily)  - patient is instructed to take probiotics (yogurt, oral pills or vaginal suppositories), take cranberry pills or drink the juice and Vitamin C 1,000 mg daily to acidify the urine  - avoid soaking in tubs and wipe front to back after urinating  -UA with pyuria -Urine sent for culture -started on Cipro 250 mg, BID x 7 days  2. Vaginal atrophy -continue vaginal cream                                         Return for follow up with Sam as scheduled .  These notes generated with voice recognition software. I apologize for typographical errors.  Zara Council, PA-C  [facility] [address]  [place], [place] [address] [phone]

## 2021-01-17 ENCOUNTER — Other Ambulatory Visit: Payer: Self-pay

## 2021-01-17 ENCOUNTER — Ambulatory Visit (INDEPENDENT_AMBULATORY_CARE_PROVIDER_SITE_OTHER): Payer: Medicare Other | Admitting: Urology

## 2021-01-17 ENCOUNTER — Encounter: Payer: Self-pay | Admitting: Urology

## 2021-01-17 VITALS — BP 109/69 | HR 96 | Ht 68.0 in | Wt 155.0 lb

## 2021-01-17 DIAGNOSIS — R300 Dysuria: Secondary | ICD-10-CM | POA: Diagnosis not present

## 2021-01-17 MED ORDER — CIPROFLOXACIN HCL 250 MG PO TABS
250.0000 mg | ORAL_TABLET | Freq: Two times a day (BID) | ORAL | 0 refills | Status: DC
Start: 2021-01-17 — End: 2021-02-21

## 2021-01-18 LAB — MICROSCOPIC EXAMINATION

## 2021-01-18 LAB — URINALYSIS, COMPLETE
Bilirubin, UA: NEGATIVE
Glucose, UA: NEGATIVE
Ketones, UA: NEGATIVE
Nitrite, UA: NEGATIVE
Protein,UA: NEGATIVE
RBC, UA: NEGATIVE
Specific Gravity, UA: 1.025 (ref 1.005–1.030)
Urobilinogen, Ur: 1 mg/dL (ref 0.2–1.0)
pH, UA: 5.5 (ref 5.0–7.5)

## 2021-02-12 ENCOUNTER — Other Ambulatory Visit: Payer: Self-pay | Admitting: Family Medicine

## 2021-02-12 DIAGNOSIS — Z1231 Encounter for screening mammogram for malignant neoplasm of breast: Secondary | ICD-10-CM

## 2021-02-21 ENCOUNTER — Ambulatory Visit
Admission: EM | Admit: 2021-02-21 | Discharge: 2021-02-21 | Disposition: A | Discharge status: Home / Self Care | Payer: Medicare Other | Attending: Emergency Medicine | Admitting: Emergency Medicine

## 2021-02-21 ENCOUNTER — Other Ambulatory Visit: Payer: Self-pay

## 2021-02-21 ENCOUNTER — Encounter: Payer: Self-pay | Admitting: Emergency Medicine

## 2021-02-21 ENCOUNTER — Ambulatory Visit (INDEPENDENT_AMBULATORY_CARE_PROVIDER_SITE_OTHER): Payer: Medicare Other

## 2021-02-21 DIAGNOSIS — M7122 Synovial cyst of popliteal space [Baker], left knee: Secondary | ICD-10-CM | POA: Diagnosis not present

## 2021-02-21 DIAGNOSIS — M25552 Pain in left hip: Secondary | ICD-10-CM | POA: Diagnosis not present

## 2021-02-21 DIAGNOSIS — M79605 Pain in left leg: Secondary | ICD-10-CM

## 2021-02-21 DIAGNOSIS — M545 Low back pain, unspecified: Secondary | ICD-10-CM | POA: Diagnosis not present

## 2021-02-21 DIAGNOSIS — M25562 Pain in left knee: Secondary | ICD-10-CM

## 2021-02-21 NOTE — ED Triage Notes (Signed)
Pt is present today with left leg pain. Pt states that she was bending over doing yard work and she heard a pop in her leg. Pt states that she couldn't tell where she felt the pop in her leg but the pain radiates down from her hip to her toes.

## 2021-02-21 NOTE — ED Provider Notes (Signed)
HPI  SUBJECTIVE:  Sarah [name] is a 87 y.o. female who presents with entire left flank pain starting yesterday after bending forward in the garden.  States that she felt a "pop" immediately followed by pain somewhere in her leg, but is not sure exactly where.  She describes constant, dull, throbbing pain, that goes from her left hip all the way to her toes.  She reports swelling posterior to her left knee and distal numbness/ tingling.  She reports weakness at the hip and knee due to pain.  States that she has difficulty weightbearing.  No erythema, ankle or hip swelling, fall, direct trauma, back pain.  She was on a fluoroquinolone about 4 weeks ago.  She has started using a cane and is taking Tylenol without improvement in her symptoms.  Symptoms are worse with weightbearing, sitting down and standing up, and sliding her leg backwards when she puts on a shoe.  She has a past medical history of peripheral neuropathy, hypertension, osteopenia.  No history of chronic kidney disease, diabetes, denies history of seizures.  States that she is unable to take ibuprofen due to interactions with her medications.  PMD: [facility].    Past Medical History:  Diagnosis Date  . Benign essential HTN 09/21/2015  . Cancer ([place])    Skin CA resected from Right eyelid and both legs.  . Seizures ([place])   . Thyroid disease     Past Surgical History:  Procedure Laterality Date  . ABDOMINAL HYSTERECTOMY    . BREAST BIOPSY Bilateral    neg    Family History  Problem Relation Age of Onset  . Breast cancer Mother 79  . Breast cancer Maternal Uncle 60  . Breast cancer Maternal Aunt        mat great aunt  . Breast cancer Maternal Aunt 41  . Tracheal cancer Father   . Cancer Sister   . Brain cancer Sister   . Cancer Brother     Social History   Tobacco Use  . Smoking status: Never Smoker  . Smokeless tobacco: Never Used  Vaping Use  . Vaping Use: Never used  Substance Use Topics  .  Alcohol use: No    No current facility-administered medications for this encounter.  Current Outpatient Medications:  .  acetaminophen (TYLENOL) 500 MG tablet, Take 1,000 mg by mouth every 6 (six) hours as needed., Disp: , Rfl:  .  beta carotene w/minerals (OCUVITE) tablet, Take 1 tablet by mouth daily., Disp: , Rfl:  .  bimatoprost (LUMIGAN) 0.03 % ophthalmic solution, Place 1 drop into both eyes at bedtime., Disp: , Rfl:  .  dicyclomine (BENTYL) 10 MG capsule, Take 10 mg by mouth 4 (four) times daily -  before meals and at bedtime. , Disp: , Rfl:  .  DULoxetine (CYMBALTA) 20 MG capsule, Take 20 mg by mouth daily. , Disp: , Rfl:  .  estradiol (ESTRACE VAGINAL) 0.1 MG/GM vaginal cream, Apply one pea-sized amount around the opening of the urethra three times weekly., Disp: 42.5 g, Rfl: 12 .  gabapentin (NEURONTIN) 300 MG capsule, Take by mouth 4 (four) times daily., Disp: , Rfl:  .  latanoprost (XALATAN) 0.005 % ophthalmic solution, , Disp: , Rfl:  .  levothyroxine (SYNTHROID) 100 MCG tablet, Take 100 mcg by mouth daily., Disp: , Rfl:  .  lovastatin (MEVACOR) 20 MG tablet, Take 20 mg by mouth daily., Disp: , Rfl:  .  LUMIGAN 0.01 % SOLN, SMARTSIG:1 Drop(s) In Eye(s)  Every Evening, Disp: , Rfl:  .  Melatonin 1 MG TABS, Take 10 mg by mouth once., Disp: , Rfl:  .  montelukast (SINGULAIR) 10 MG tablet, Take 10 mg by mouth at bedtime., Disp: , Rfl:  .  nortriptyline (PAMELOR) 10 MG capsule, Take 10 mg by mouth at bedtime., Disp: , Rfl:  .  polyethylene glycol powder (GLYCOLAX/MIRALAX) 17 GM/SCOOP powder, Take by mouth., Disp: , Rfl:  .  Probiotic, Lactobacillus, CAPS, Take by mouth., Disp: , Rfl:  .  simvastatin (ZOCOR) 10 MG tablet, Take 10 mg by mouth at bedtime. , Disp: , Rfl:  .  Trospium Chloride 60 MG CP24, TAKE 1 CAPSULE BY MOUTH EVERY DAY, Disp: 90 capsule, Rfl: 1  Allergies  Allergen Reactions  . Macrobid [Nitrofurantoin] Shortness Of Breath  . Codeine Other (See Comments)    Makes  her disoriented  . Hydrocodone Other (See Comments)    Patient states she was seeing things that weren't there and very disoriented.  . Oxycodone Other (See Comments)    hallucinations  . Peanut-Containing Drug Products     Other reaction(s): Headache  . Peanuts [Peanut Oil] Other (See Comments)    Violent Migraines  . Penicillins Nausea Only  . Tramadol Other (See Comments)    Dizziness.     ROS  As noted in HPI.   Physical Exam  BP (!) 130/93 (BP Location: Left Arm)   Pulse 84   Temp 98.3 F (36.8 C) (Oral)   Resp 16   SpO2 100%   Constitutional: Well developed, well nourished, no acute distress Eyes:  EOMI, conjunctiva normal bilaterally HENT: Normocephalic, atraumatic,mucus membranes moist Respiratory: Normal inspiratory effort Cardiovascular: Normal rate GI: nondistended skin: No rash, skin intact Back: Positive tenderness at L5, S1. Musculoskeletal: No signs of trauma L hip. No bruising, erythema, rash.  Tenderness at the insertion of the proximal hamstrings.  No muscular tenderness over gluteal muscles, down IT band, quadriceps. No pain with passive abduction/adduction of leg. pain with int/ext rotation hip.  Positive tenderness at sciatic notch. Motor strength flexion/ext hip 5/5. Sensation to LT intact. DP 2+ patient able to bear weight on left leg.  Left knee: Healed TKA surgical scar.  Knee ROM baseline for Pt, Flexion  intact /   Patella NT, Patellar tendon NT, Medial joint NT, Lateral joint NT, Popliteal region swollen, tender, no erythema. varus MCL stress testing stable, Valgus LCL stress testing stable, McMurray's testing normal, Lachman's negative.  No effusion. No erythema. No increased temperature..  Ankle nontender, normal.  Neurologic: Alert & oriented x 3, no focal neuro deficits Psychiatric: Speech and behavior appropriate   ED Course   Medications - No data to display  Orders Placed This Encounter  Procedures  . DG Hip Unilat With Pelvis 2-3  Views Left    Standing Status:   Standing    Number of Occurrences:   1    Order Specific Question:   Symptom/Reason for Exam    Answer:   Left hip pain [[id number]]  . DG Knee Complete 4 Views Left    Standing Status:   Standing    Number of Occurrences:   1    Order Specific Question:   Reason for Exam (SYMPTOM  OR DIAGNOSIS REQUIRED)    Answer:   "pop" soft tissue swelling behind knee. S/p TKA evaluate for acute change  . DG Lumbar Spine Complete    Standing Status:   Standing    Number of Occurrences:   1  Order Specific Question:   Reason for Exam (SYMPTOM  OR DIAGNOSIS REQUIRED)    Answer:   "Pop" with immediate leg pain., history of osteopenia.  Positive tenderness at L5, S1.  Rule out acute changes.  . AMB referral to orthopedics    Referral Priority:   Urgent    Referral Type:   Consultation    Referral Reason:   Specialty Services Required    Number of Visits Requested:   1    No results found for this or any previous visit (from the past 24 hour(s)). DG Lumbar Spine Complete  Result Date: 02/21/2021 CLINICAL DATA:  Left leg pain. EXAM: LUMBAR SPINE - COMPLETE 4+ VIEW COMPARISON:  CT abdomen pelvis dated September 27, 2020. FINDINGS: Five lumbar type vertebral bodies. No acute fracture or subluxation. Vertebral body heights are preserved. Alignment is normal. Unchanged severe disc height loss from L2-L3 through L4-L5. Unchanged moderate to severe lower lumbar facet arthropathy. The sacroiliac joints are unremarkable. IMPRESSION: 1. Unchanged severe lumbar spondylosis. Electronically Signed   By: Titus Dubin M.D.   On: 02/21/2021 11:56   DG Knee Complete 4 Views Left  Result Date: 02/21/2021 CLINICAL DATA:  Left leg pain after bending over and feeling a pop. EXAM: LEFT KNEE - COMPLETE 4+ VIEW COMPARISON:  Left knee x-rays dated November 30, 2013. FINDINGS: Prior total knee arthroplasty. No evidence of hardware failure or loosening. No acute fracture or dislocation. No joint effusion.  Soft tissues are unremarkable. IMPRESSION: 1. No acute osseous abnormality. 2. Prior total knee arthroplasty without hardware complication Electronically Signed   By: Titus Dubin M.D.   On: 02/21/2021 11:54   DG Hip Unilat With Pelvis 2-3 Views Left  Result Date: 02/21/2021 CLINICAL DATA:  Left leg pain after bending over and feeling a pop. EXAM: DG HIP (WITH OR WITHOUT PELVIS) 2-3V LEFT COMPARISON:  CT abdomen pelvis dated September 27, 2020. FINDINGS: There is no evidence of hip fracture or dislocation. There is no evidence of arthropathy or other focal bone abnormality. Chronic left trochanteric and ischial tuberosity enthesopathy. IMPRESSION: 1. No acute osseous abnormality. Electronically Signed   By: Titus Dubin M.D.   On: 02/21/2021 11:53    ED Clinical Impression  1. Posterior knee pain, left   2. Left hip pain   3. Synovial cyst of left popliteal space      ED Assessment/Plan  Difficult to tell where the pain is coming from: Whether it is from the back, hip, or knee.  Because of the history of osteopenia, bony tenderness, will x-ray all 3.  Suspect that the bony tenderness in her lower back is coming from prolonged period of bending over.  Concern for hamstring tear given recent fluoroquinolone use.  However, she does have new tender swelling posterior to her left knee which is suggestive of a Bakers cyst.  There is no evidence of DVT.  Her calves are symmetric, nontender, no edema, color changes.  Pulse is intact distally  Reviewed imaging independently.  L-spine: Severe lumbar generative changes.  No acute changes.  Knee: No acute changes.  Hip: No acute changes see radiology report for full details.  X-rays are negative for fracture, any acute changes.  Suspect hamstring tear and a Bakers cyst.  Plan to send home with Tylenol, ice.  Discussed with patient that I was hesitant to send her home with something stronger due to risk of falls.  Patient states Voltaren topical does not  "do anything for me".  States that  she is unable to take NSAIDs due to medication interaction with one of her medications.  Will place referral to [facility], also giving information on Dr. Leim Fabry at [facility] orthopedic surgery, orthopedics on call, [facility] for evaluation in several days.  Discussed , imaging, MDM, treatment plan, and plan for follow-up with patient. Discussed sn/sx that should prompt return to the ED. patient agrees with plan.   No orders of the defined types were placed in this encounter.     *This clinic note was created using Dragon dictation software. Therefore, there may be occasional mistakes despite careful proofreading.  ?    Melynda Ripple, MD 02/23/21 1423

## 2021-02-21 NOTE — Discharge Instructions (Addendum)
Your x-rays were negative for any acute fractures.  You have severe arthritis in your lumbar spine.  I suspect that your back tenderness is coming from bending over for a long time yesterday.  I am concerned that you may have torn her hamstring, and that you have developed a Baker's cyst.  Please follow-up with [facility], [facility] or Dr. Posey Pronto as soon as you can for further evaluation.  I have put in a referral to [facility].  Go immediately to the ER if your leg swells, chest pain, shortness of breath, or for any other concerns.

## 2021-02-25 ENCOUNTER — Encounter: Payer: Self-pay | Admitting: Student in an Organized Health Care Education/Training Program

## 2021-02-25 ENCOUNTER — Other Ambulatory Visit: Payer: Self-pay

## 2021-02-25 ENCOUNTER — Ambulatory Visit
Payer: Medicare Other | Attending: Student in an Organized Health Care Education/Training Program | Admitting: Student in an Organized Health Care Education/Training Program

## 2021-02-25 VITALS — BP 113/74 | HR 96 | Temp 97.1°F | Resp 16 | Ht 68.0 in | Wt 156.0 lb

## 2021-02-25 DIAGNOSIS — G894 Chronic pain syndrome: Secondary | ICD-10-CM | POA: Insufficient documentation

## 2021-02-25 DIAGNOSIS — M5126 Other intervertebral disc displacement, lumbar region: Secondary | ICD-10-CM | POA: Diagnosis not present

## 2021-02-25 DIAGNOSIS — M5416 Radiculopathy, lumbar region: Secondary | ICD-10-CM | POA: Insufficient documentation

## 2021-02-25 NOTE — Progress Notes (Signed)
PROVIDER NOTE: Information contained herein reflects review and annotations entered in association with encounter. Interpretation of such information and data should be left to medically-trained personnel. Information provided to patient can be located elsewhere in the medical record under "Patient Instructions". Document created using STT-dictation technology, any transcriptional errors that may result from process are unintentional.    Patient: Sarah [name]  Service Category: E/M  Provider: Gillis Santa, MD  DOB: Mar 04, 1932  DOS: 02/25/2021  Specialty: Interventional Pain Management  MRN: [id number]  Setting: Ambulatory outpatient  PCP: Sharyne Peach, MD  Type: Established Patient    Referring Provider: Sharyne Peach, MD  Location: Office  Delivery: Face-to-face     HPI  [name], a 87 y.o. year old female, is here today because of her Lumbar radiculopathy [M54.16]. [name] primary complain today is Back Pain (Low back pain bilateral) Last encounter: My last encounter with her was on 10/10/2020. Pertinent problems: [name] has Lumbar facet arthropathy; Lumbar radiculopathy; Lumbar disc herniation (L2/L3); Lumbar degenerative disc disease; Chronic pain syndrome; Neuropathy; Thyroid nodule; and Bilateral occipital neuralgia on their pertinent problem list. Pain Assessment: Severity of Chronic pain is reported as a 6 /10. Location: Back Lower,Left,Right/denies. Onset: More than a month ago. Quality: Discomfort,Constant,Aching. Timing: Constant. Modifying factor(s): sitting down. Vitals:  height is 5' 8"  (1.727 m) and weight is 156 lb (70.8 kg). Her temporal temperature is 97.1 F (36.2 C) (abnormal). Her blood pressure is 113/74 and her pulse is 96. Her respiration is 16 and oxygen saturation is 97%.   Reason for encounter: worsening of previously known (established) problem    [name] is having increased low back and bilateral hip pain.  Her previous  lumbar epidural was in November 2021.  We discussed repeating L2-L3 ESI which will be the first 1 for this year.  Risks and benefits reviewed and patient would like to proceed.  ROS  Constitutional: Denies any fever or chills Gastrointestinal: No reported hemesis, hematochezia, vomiting, or acute GI distress Musculoskeletal: Low back, bilateral hip pain Neurological: No reported episodes of acute onset apraxia, aphasia, dysarthria, agnosia, amnesia, paralysis, loss of coordination, or loss of consciousness  Medication Review  DULoxetine, Probiotic (Lactobacillus), Trospium Chloride, acetaminophen, beta carotene w/minerals, bimatoprost, ciprofloxacin, dicyclomine, donepezil, estradiol, gabapentin, latanoprost, levothyroxine, lovastatin, melatonin, montelukast, nortriptyline, polyethylene glycol powder, and simvastatin  History Review  Allergy: [name] is allergic to macrobid [nitrofurantoin], codeine, hydrocodone, oxycodone, peanut-containing drug products, peanuts [peanut oil], penicillins, and tramadol. Drug: [name]  has no history on file for drug use. Alcohol:  reports no history of alcohol use. Tobacco:  reports that she has never smoked. She has never used smokeless tobacco. Social: [name]  reports that she has never smoked. She has never used smokeless tobacco. She reports that she does not drink alcohol. Medical:  has a past medical history of Benign essential HTN (09/21/2015), Cancer ([place]), Seizures ([place]), and Thyroid disease. Surgical: [name]  has a past surgical history that includes Abdominal hysterectomy and Breast biopsy (Bilateral). Family: family history includes Brain cancer in her sister; Breast cancer in her maternal aunt; Breast cancer (age of onset: 20) in her maternal aunt; Breast cancer (age of onset: 22) in her mother; Breast cancer (age of onset: 77) in her maternal uncle; Cancer in her brother and sister; Tracheal cancer in her father.  Laboratory  Chemistry Profile   Renal Lab Results  Component Value Date   BUN 23 (H) 09/14/2015   CREATININE 0.80  09/12/2020   GFRAA 77 09/12/2020   GFRNONAA 67 09/12/2020     Hepatic Lab Results  Component Value Date   AST 19 11/26/2015   ALT 15 11/26/2015   ALBUMIN 4.7 11/26/2015   ALKPHOS 64 11/26/2015     Electrolytes Lab Results  Component Value Date   NA 141 09/14/2015   K 3.5 09/14/2015   CL 107 09/14/2015   CALCIUM 9.7 09/14/2015   MG 2.2 09/14/2015     Bone No results found for: VD25OH, VD125OH2TOT, GO1157WI2, MB5597CB6, 25OHVITD1, 25OHVITD2, 25OHVITD3, TESTOFREE, TESTOSTERONE   Inflammation (CRP: Acute Phase) (ESR: Chronic Phase) No results found for: CRP, ESRSEDRATE, LATICACIDVEN     Note: Above Lab results reviewed.  Recent Imaging Review  DG Lumbar Spine Complete CLINICAL DATA:  Left leg pain.  EXAM: LUMBAR SPINE - COMPLETE 4+ VIEW  COMPARISON:  CT abdomen pelvis dated September 27, 2020.  FINDINGS: Five lumbar type vertebral bodies.  No acute fracture or subluxation. Vertebral body heights are preserved.  Alignment is normal.  Unchanged severe disc height loss from L2-L3 through L4-L5. Unchanged moderate to severe lower lumbar facet arthropathy.  The sacroiliac joints are unremarkable.  IMPRESSION: 1. Unchanged severe lumbar spondylosis.  Electronically Signed   By: Titus Dubin M.D.   On: 02/21/2021 11:56 DG Knee Complete 4 Views Left CLINICAL DATA:  Left leg pain after bending over and feeling a pop.  EXAM: LEFT KNEE - COMPLETE 4+ VIEW  COMPARISON:  Left knee x-rays dated November 30, 2013.  FINDINGS: Prior total knee arthroplasty. No evidence of hardware failure or loosening. No acute fracture or dislocation. No joint effusion. Soft tissues are unremarkable.  IMPRESSION: 1. No acute osseous abnormality. 2. Prior total knee arthroplasty without hardware complication  Electronically Signed   By: Titus Dubin M.D.   On: 02/21/2021  11:54 DG Hip Unilat With Pelvis 2-3 Views Left CLINICAL DATA:  Left leg pain after bending over and feeling a pop.  EXAM: DG HIP (WITH OR WITHOUT PELVIS) 2-3V LEFT  COMPARISON:  CT abdomen pelvis dated September 27, 2020.  FINDINGS: There is no evidence of hip fracture or dislocation. There is no evidence of arthropathy or other focal bone abnormality. Chronic left trochanteric and ischial tuberosity enthesopathy.  IMPRESSION: 1. No acute osseous abnormality.  Electronically Signed   By: Titus Dubin M.D.   On: 02/21/2021 11:53 Note: Reviewed        Physical Exam  General appearance: Well nourished, well developed, and well hydrated. In no apparent acute distress Mental status: Alert, oriented x 3 (person, place, & time)       Respiratory: No evidence of acute respiratory distress Eyes: PERLA Vitals: BP 113/74 (BP Location: Right Arm, Patient Position: Sitting, Cuff Size: Normal)   Pulse 96   Temp (!) 97.1 F (36.2 C) (Temporal)   Resp 16   Ht 5' 8"  (1.727 m)   Wt 156 lb (70.8 kg)   SpO2 97%   BMI 23.72 kg/m  BMI: Estimated body mass index is 23.72 kg/m as calculated from the following:   Height as of this encounter: 5' 8"  (1.727 m).   Weight as of this encounter: 156 lb (70.8 kg). Ideal: Ideal body weight: 63.9 kg (140 lb 14 oz) Adjusted ideal body weight: 66.6 kg (146 lb 14.8 oz)  Lumbar Spine Area Exam  Skin & Axial Inspection: No masses, redness, or swelling Alignment: Symmetrical Functional ROM: Pain restricted ROM       Stability: No instability detected Muscle  Tone/Strength: Functionally intact. No obvious neuro-muscular anomalies detected. Sensory (Neurological): Dermatomal pain pattern Palpation: No palpable anomalies       Provocative Tests: Hyperextension/rotation test: (+) due to pain. Lumbar quadrant test (Kemp's test): (+) on the left for foraminal stenosis Lateral bending test: deferred today       Gait & Posture Assessment  Ambulation:  Unassisted Gait: Relatively normal for age and body habitus Posture: Difficulty standing up straight, due to pain  Lower Extremity Exam    Side: Right lower extremity  Side: Left lower extremity  Stability: No instability observed          Stability: No instability observed          Skin & Extremity Inspection: Skin color, temperature, and hair growth are WNL. No peripheral edema or cyanosis. No masses, redness, swelling, asymmetry, or associated skin lesions. No contractures.  Skin & Extremity Inspection: Skin color, temperature, and hair growth are WNL. No peripheral edema or cyanosis. No masses, redness, swelling, asymmetry, or associated skin lesions. No contractures.  Functional ROM: Pain restricted ROM for hip and knee joints          Functional ROM: Pain restricted ROM for hip and knee joints          Muscle Tone/Strength: Functionally intact. No obvious neuro-muscular anomalies detected.  Muscle Tone/Strength: Functionally intact. No obvious neuro-muscular anomalies detected.  Sensory (Neurological): Unimpaired        Sensory (Neurological): Unimpaired        DTR: Patellar: deferred today Achilles: deferred today Plantar: deferred today  DTR: Patellar: deferred today Achilles: deferred today Plantar: deferred today  Palpation: No palpable anomalies  Palpation: No palpable anomalies   Assessment   Status Diagnosis  Having a Flare-up Having a Flare-up Persistent 1. Lumbar radiculopathy   2. Lumbar disc herniation (L2/L3)   3. Chronic pain syndrome        Plan of Care  [name] has a current medication list which includes the following long-term medication(s): dicyclomine, duloxetine, lovastatin, montelukast, simvastatin, donepezil, levothyroxine, and nortriptyline.  Orders:  Orders Placed This Encounter  Procedures  . Lumbar Epidural Injection    Standing Status:   Future    Standing Expiration Date:   03/27/2021    Scheduling Instructions:      Procedure: Interlaminar Lumbar Epidural Steroid injection (LESI)    L2/L3     Laterality: Midline     Sedation: without     Timeframe: ASAA    Order Specific Question:   Where will this procedure be performed?    Answer:   [facility]   Follow-up plan:   Return in about 1 week (around 03/04/2021) for L2/3 ESI without sedation.     Finds benefit with bilateral sacroiliac joint injection, previous one 03/14/2019 and then second 1 05/25/2019.  Also finds lumbar epidural steroid injections for when she has sciatic flare, consider repeating.  Previous one performed on 10/13/2018 at L2-L3.  Status post bilateral C4, C5, C6, C7 cervical facet medial branch nerve block on 03/05/2020, 05/21/20 for cervical facet arthropathy. Bilateral greater occipital nerve block 08/13/2020 helped significantly, repeat as needed.        Recent Visits No visits were found meeting these conditions. Showing recent visits within past 90 days and meeting all other requirements Today's Visits Date Type Provider Dept  02/25/21 Office Visit Gillis Santa, MD [facility]  Showing today's visits and meeting all other requirements Future Appointments No visits were found meeting these  conditions. Showing future appointments within next 90 days and meeting all other requirements  I discussed the assessment and treatment plan with the patient. The patient was provided an opportunity to ask questions and all were answered. The patient agreed with the plan and demonstrated an understanding of the instructions.  Patient advised to call back or seek an in-person evaluation if the symptoms or condition worsens.  Duration of encounter: 65mnutes.  Note by: BGillis Santa MD Date: 02/25/2021; Time: 1:44 PM

## 2021-02-25 NOTE — Progress Notes (Signed)
Safety precautions to be maintained throughout the outpatient stay will include: orient to surroundings, keep bed in low position, maintain call bell within reach at all times, provide assistance with transfer out of bed and ambulation.  

## 2021-02-25 NOTE — Patient Instructions (Signed)
____________________________________________________________________________________________  General Risks and Possible Complications  Patient Responsibilities: It is important that you read this as it is part of your informed consent. It is our duty to inform you of the risks and possible complications associated with treatments offered to you. It is your responsibility as a patient to read this and to ask questions about anything that is not clear or that you believe was not covered in this document.  Patient's Rights: You have the right to refuse treatment. You also have the right to change your mind, even after initially having agreed to have the treatment done. However, under this last option, if you wait until the last second to change your mind, you may be charged for the materials used up to that point.  Introduction: Medicine is not an exact science. Everything in Medicine, including the lack of treatment(s), carries the potential for danger, harm, or loss (which is by definition: Risk). In Medicine, a complication is a secondary problem, condition, or disease that can aggravate an already existing one. All treatments carry the risk of possible complications. The fact that a side effects or complications occurs, does not imply that the treatment was conducted incorrectly. It must be clearly understood that these can happen even when everything is done following the highest safety standards.  No treatment: You can choose not to proceed with the proposed treatment alternative. The "PRO(s)" would include: avoiding the risk of complications associated with the therapy. The "CON(s)" would include: not getting any of the treatment benefits. These benefits fall under one of three categories: diagnostic; therapeutic; and/or palliative. Diagnostic benefits include: getting information which can ultimately lead to improvement of the disease or symptom(s). Therapeutic benefits are those associated with the  successful treatment of the disease. Finally, palliative benefits are those related to the decrease of the primary symptoms, without necessarily curing the condition (example: decreasing the pain from a flare-up of a chronic condition, such as incurable terminal cancer).  General Risks and Complications: These are associated to most interventional treatments. They can occur alone, or in combination. They fall under one of the following six (6) categories: no benefit or worsening of symptoms; bleeding; infection; nerve damage; allergic reactions; and/or death. 1. No benefits or worsening of symptoms: In Medicine there are no guarantees, only probabilities. No healthcare provider can ever guarantee that a medical treatment will work, they can only state the probability that it may. Furthermore, there is always the possibility that the condition may worsen, either directly, or indirectly, as a consequence of the treatment. 2. Bleeding: This is more common if the patient is taking a blood thinner, either prescription or over the counter (example: Goody Powders, Fish oil, Aspirin, Garlic, etc.), or if suffering a condition associated with impaired coagulation (example: Hemophilia, cirrhosis of the liver, low platelet counts, etc.). However, even if you do not have one on these, it can still happen. If you have any of these conditions, or take one of these drugs, make sure to notify your treating physician. 3. Infection: This is more common in patients with a compromised immune system, either due to disease (example: diabetes, cancer, human immunodeficiency virus [HIV], etc.), or due to medications or treatments (example: therapies used to treat cancer and rheumatological diseases). However, even if you do not have one on these, it can still happen. If you have any of these conditions, or take one of these drugs, make sure to notify your treating physician. 4. Nerve Damage: This is more common when the   treatment is  an invasive one, but it can also happen with the use of medications, such as those used in the treatment of cancer. The damage can occur to small secondary nerves, or to large primary ones, such as those in the spinal cord and brain. This damage may be temporary or permanent and it may lead to impairments that can range from temporary numbness to permanent paralysis and/or brain death. 5. Allergic Reactions: Any time a substance or material comes in contact with our body, there is the possibility of an allergic reaction. These can range from a mild skin rash (contact dermatitis) to a severe systemic reaction (anaphylactic reaction), which can result in death. 6. Death: In general, any medical intervention can result in death, most of the time due to an unforeseen complication. ____________________________________________________________________________________________  Epidural Steroid Injection Patient Information  Description: The epidural space surrounds the nerves as they exit the spinal cord.  In some patients, the nerves can be compressed and inflamed by a bulging disc or a tight spinal canal (spinal stenosis).  By injecting steroids into the epidural space, we can bring irritated nerves into direct contact with a potentially helpful medication.  These steroids act directly on the irritated nerves and can reduce swelling and inflammation which often leads to decreased pain.  Epidural steroids may be injected anywhere along the spine and from the neck to the low back depending upon the location of your pain.   After numbing the skin with local anesthetic (like Novocaine), a small needle is passed into the epidural space slowly.  You may experience a sensation of pressure while this is being done.  The entire block usually last less than 10 minutes.  Conditions which may be treated by epidural steroids:   Low back and leg pain  Neck and arm pain  Spinal stenosis  Post-laminectomy  syndrome  Herpes zoster (shingles) pain  Pain from compression fractures  Preparation for the injection:  1. Do not eat any solid food or dairy products within 8 hours of your appointment.  2. You may drink clear liquids up to 3 hours before appointment.  Clear liquids include water, black coffee, juice or soda.  No milk or cream please. 3. You may take your regular medication, including pain medications, with a sip of water before your appointment  Diabetics should hold regular insulin (if taken separately) and take 1/2 normal NPH dos the morning of the procedure.  Carry some sugar containing items with you to your appointment. 4. A driver must accompany you and be prepared to drive you home after your procedure.  5. Bring all your current medications with your. 6. An IV may be inserted and sedation may be given at the discretion of the physician.   7. A blood pressure cuff, EKG and other monitors will often be applied during the procedure.  Some patients may need to have extra oxygen administered for a short period. 8. You will be asked to provide medical information, including your allergies, prior to the procedure.  We must know immediately if you are taking blood thinners (like Coumadin/Warfarin)  Or if you are allergic to IV iodine contrast (dye). We must know if you could possible be pregnant.  Possible side-effects:  Bleeding from needle site  Infection (rare, may require surgery)  Nerve injury (rare)  Numbness & tingling (temporary)  Difficulty urinating (rare, temporary)  Spinal headache ( a headache worse with upright posture)  Light -headedness (temporary)  Pain at injection site (several   days)  Decreased blood pressure (temporary)  Weakness in arm/leg (temporary)  Pressure sensation in back/neck (temporary)  Call if you experience:  Fever/chills associated with headache or increased back/neck pain.  Headache worsened by an upright position.  New onset  weakness or numbness of an extremity below the injection site  Hives or difficulty breathing (go to the emergency room)  Inflammation or drainage at the infection site  Severe back/neck pain  Any new symptoms which are concerning to you  Please note:  Although the local anesthetic injected can often make your back or neck feel good for several hours after the injection, the pain will likely return.  It takes 3-7 days for steroids to work in the epidural space.  You may not notice any pain relief for at least that one week.  If effective, we will often do a series of three injections spaced 3-6 weeks apart to maximally decrease your pain.  After the initial series, we generally will wait several months before considering a repeat injection of the same type.  If you have any questions, please call [phone] [facility] 

## 2021-02-26 ENCOUNTER — Ambulatory Visit: Payer: Self-pay | Admitting: Physician Assistant

## 2021-02-26 ENCOUNTER — Ambulatory Visit (INDEPENDENT_AMBULATORY_CARE_PROVIDER_SITE_OTHER): Payer: Medicare Other | Admitting: Physician Assistant

## 2021-02-26 ENCOUNTER — Encounter: Payer: Self-pay | Admitting: Physician Assistant

## 2021-02-26 VITALS — BP 126/75 | HR 85 | Ht 68.0 in | Wt 157.0 lb

## 2021-02-26 DIAGNOSIS — N390 Urinary tract infection, site not specified: Secondary | ICD-10-CM | POA: Diagnosis not present

## 2021-02-26 NOTE — Progress Notes (Signed)
02/26/2021 12:30 PM   Sarah [name] April 11, 1932 [id number]  CC: Chief Complaint  Patient presents with  . Recurrent UTI  . Follow-up    HPI: Sarah [name] is a 87 y.o. female with PMH OAB on trospium, recurrent UTI on cranberry supplements, and vaginal atrophy on vaginal estrogen who presents today for routine follow-up.  She was seen in clinic most recently by Zara Council on 01/17/2021 with acute cystitis.  She was treated with Cipro 250 mg twice daily x7 days, however culture was not sent.  Today she reports no dysuria and stable urinary symptoms.  She has no acute concerns today.  She continues to use vaginal estrogen cream.  PMH: Past Medical History:  Diagnosis Date  . Benign essential HTN 09/21/2015  . Cancer ([place])    Skin CA resected from Right eyelid and both legs.  . Seizures ([place])   . Thyroid disease     Surgical History: Past Surgical History:  Procedure Laterality Date  . ABDOMINAL HYSTERECTOMY    . BREAST BIOPSY Bilateral    neg    Home Medications:  Allergies as of 02/26/2021      Reactions   Macrobid [nitrofurantoin] Shortness Of Breath   Codeine Other (See Comments)   Makes her disoriented   Hydrocodone Other (See Comments)   Patient states she was seeing things that weren't there and very disoriented.   Oxycodone Other (See Comments)   hallucinations   Peanut-containing Drug Products    Other reaction(s): Headache   Peanuts [peanut Oil] Other (See Comments)   Violent Migraines   Penicillins Nausea Only   Tramadol Other (See Comments)   Dizziness.      Medication List       Accurate as of February 26, 2021 12:30 PM. If you have any questions, ask your nurse or doctor.        acetaminophen 500 MG tablet Commonly known as: TYLENOL Take 1,000 mg by mouth every 6 (six) hours as needed.   beta carotene w/minerals tablet Take 1 tablet by mouth daily.   bimatoprost 0.03 % ophthalmic solution Commonly known as:  LUMIGAN Place 1 drop into both eyes at bedtime.   Lumigan 0.01 % Soln Generic drug: bimatoprost   ciprofloxacin 500 MG tablet Commonly known as: CIPRO Take 500 mg by mouth at bedtime. Prior to dentist   dicyclomine 10 MG capsule Commonly known as: BENTYL Take 10 mg by mouth 4 (four) times daily -  before meals and at bedtime.   donepezil 5 MG tablet Commonly known as: ARICEPT Take 5 mg by mouth at bedtime.   DULoxetine 20 MG capsule Commonly known as: CYMBALTA Take 20 mg by mouth daily.   estradiol 0.1 MG/GM vaginal cream Commonly known as: ESTRACE VAGINAL Apply one pea-sized amount around the opening of the urethra three times weekly.   gabapentin 300 MG capsule Commonly known as: NEURONTIN Take by mouth 4 (four) times daily.   latanoprost 0.005 % ophthalmic solution Commonly known as: XALATAN   levothyroxine 100 MCG tablet Commonly known as: SYNTHROID Take 100 mcg by mouth daily.   lovastatin 20 MG tablet Commonly known as: MEVACOR Take 20 mg by mouth daily.   melatonin 1 MG Tabs tablet Take 10 mg by mouth once.   montelukast 10 MG tablet Commonly known as: SINGULAIR Take 10 mg by mouth at bedtime.   nortriptyline 10 MG capsule Commonly known as: PAMELOR Take 10 mg by mouth at bedtime.   polyethylene glycol powder 17  GM/SCOOP powder Commonly known as: GLYCOLAX/MIRALAX Take by mouth.   Probiotic (Lactobacillus) Caps Take by mouth.   simvastatin 10 MG tablet Commonly known as: ZOCOR Take 10 mg by mouth at bedtime.   Trospium Chloride 60 MG Cp24 TAKE 1 CAPSULE BY MOUTH EVERY DAY       Allergies:  Allergies  Allergen Reactions  . Macrobid [Nitrofurantoin] Shortness Of Breath  . Codeine Other (See Comments)    Makes her disoriented  . Hydrocodone Other (See Comments)    Patient states she was seeing things that weren't there and very disoriented.  . Oxycodone Other (See Comments)    hallucinations  . Peanut-Containing Drug Products      Other reaction(s): Headache  . Peanuts [Peanut Oil] Other (See Comments)    Violent Migraines  . Penicillins Nausea Only  . Tramadol Other (See Comments)    Dizziness.    Family History: Family History  Problem Relation Age of Onset  . Breast cancer Mother 41  . Breast cancer Maternal Uncle 60  . Breast cancer Maternal Aunt        mat great aunt  . Breast cancer Maternal Aunt 65  . Tracheal cancer Father   . Cancer Sister   . Brain cancer Sister   . Cancer Brother     Social History:   reports that she has never smoked. She has never used smokeless tobacco. She reports that she does not drink alcohol. No history on file for drug use.  Physical Exam: BP 126/75   Pulse 85   Ht 5\' 8"  (1.727 m)   Wt 157 lb (71.2 kg)   BMI 23.87 kg/m   Constitutional:  Alert and oriented, no acute distress, nontoxic appearing HEENT: [place], AT Cardiovascular: No clubbing, cyanosis, or edema Respiratory: Normal respiratory effort, no increased work of breathing Skin: No rashes, bruises or suspicious lesions Neurologic: Grossly intact, no focal deficits, moving all 4 extremities Psychiatric: Normal mood and affect  Assessment & Plan:   1. Recurrent UTI Not clinically infected today.  Counseled her to continue topical vaginal estrogen cream for UTI prevention.  Reiterated the role of vaginal estrogen in UTI prevention.  She expressed understanding.  Return if symptoms worsen or fail to improve.  Debroah Loop, PA-C  [facility] [address], [place], [place] [address] [phone]

## 2021-03-06 ENCOUNTER — Ambulatory Visit
Admission: RE | Admit: 2021-03-06 | Discharge: 2021-03-06 | Disposition: A | Discharge status: Home / Self Care | Payer: Medicare Other | Source: Ambulatory Visit | Attending: Student in an Organized Health Care Education/Training Program | Admitting: Student in an Organized Health Care Education/Training Program

## 2021-03-06 ENCOUNTER — Encounter: Payer: Self-pay | Admitting: Student in an Organized Health Care Education/Training Program

## 2021-03-06 ENCOUNTER — Ambulatory Visit (HOSPITAL_BASED_OUTPATIENT_CLINIC_OR_DEPARTMENT_OTHER): Payer: Medicare Other | Admitting: Student in an Organized Health Care Education/Training Program

## 2021-03-06 ENCOUNTER — Other Ambulatory Visit: Payer: Self-pay

## 2021-03-06 VITALS — BP 144/85 | HR 84 | Temp 96.6°F | Resp 18 | Ht 68.0 in | Wt 157.0 lb

## 2021-03-06 DIAGNOSIS — M5416 Radiculopathy, lumbar region: Secondary | ICD-10-CM

## 2021-03-06 DIAGNOSIS — M5126 Other intervertebral disc displacement, lumbar region: Secondary | ICD-10-CM | POA: Diagnosis present

## 2021-03-06 DIAGNOSIS — G894 Chronic pain syndrome: Secondary | ICD-10-CM | POA: Diagnosis present

## 2021-03-06 MED ORDER — SODIUM CHLORIDE 0.9% FLUSH
2.0000 mL | Freq: Once | INTRAVENOUS | Status: AC
  Administered 2021-03-06: 2 mL

## 2021-03-06 MED ORDER — DEXAMETHASONE SODIUM PHOSPHATE 10 MG/ML IJ SOLN
10.0000 mg | Freq: Once | INTRAMUSCULAR | Status: AC
  Administered 2021-03-06: 10 mg
  Filled 2021-03-06: qty 1

## 2021-03-06 MED ORDER — LIDOCAINE HCL 2 % IJ SOLN
INTRAMUSCULAR | Status: AC
  Filled 2021-03-06: qty 10

## 2021-03-06 MED ORDER — ROPIVACAINE HCL 2 MG/ML IJ SOLN
INTRAMUSCULAR | Status: AC
  Filled 2021-03-06: qty 20

## 2021-03-06 MED ORDER — ROPIVACAINE HCL 2 MG/ML IJ SOLN
2.0000 mL | Freq: Once | INTRAMUSCULAR | Status: AC
  Administered 2021-03-06: 2 mL via EPIDURAL

## 2021-03-06 MED ORDER — SODIUM CHLORIDE (PF) 0.9 % IJ SOLN
INTRAMUSCULAR | Status: AC
  Filled 2021-03-06: qty 10

## 2021-03-06 MED ORDER — IOHEXOL 180 MG/ML  SOLN
10.0000 mL | Freq: Once | INTRAMUSCULAR | Status: AC
  Administered 2021-03-06: 5 mL via EPIDURAL

## 2021-03-06 NOTE — Progress Notes (Signed)
Patient's Name: Sarah [name]  MRN: [id number]  Referring Provider: Sharyne Peach, MD  DOB: 14-Aug-1932  PCP: Sarah Peach, MD  DOS: 03/06/2021  Note by: Sarah Santa, MD  Service setting: Ambulatory outpatient  Specialty: Interventional Pain Management  Patient type: Established  Location: [facility] (AMB) Pain Management Facility  Visit type: Interventional Procedure   Primary Reason for Visit: Interventional Pain Management Treatment. CC: Back Pain (Lumbar bilateral )  Procedure:          Anesthesia, Analgesia, Anxiolysis:  Type: Therapeutic Inter-Laminar Epidural Steroid Injection  #1 in 2022 Region: Lumbar Level: L2-3 Level. Laterality: Midline         Type: Local Anesthesia Indication(s): Analgesia         Route: Infiltration ([place]/IM) IV Access: Declined Sedation: Declined  Local Anesthetic: Lidocaine 1-2%  Position: Prone with head of the table was raised to facilitate breathing.   Indications: 1. Lumbar radiculopathy   2. Lumbar disc herniation (L2/L3)   3. Chronic pain syndrome    Pain Score: Pre-procedure: 4 /10 Post-procedure: 0-No pain/10  Pre-op Assessment:  Sarah [name] is a 87 y.o. (year old), female patient, seen today for interventional treatment. She  has a past surgical history that includes Abdominal hysterectomy and Breast biopsy (Bilateral). Sarah [name] has a current medication list which includes the following prescription(s): acetaminophen, beta carotene w/minerals, bimatoprost, ciprofloxacin, dicyclomine, donepezil, duloxetine, estradiol, gabapentin, latanoprost, levothyroxine, lovastatin, lumigan, melatonin, montelukast, nortriptyline, polyethylene glycol powder, probiotic (lactobacillus), simvastatin, and trospium chloride. Her primarily concern today is the Back Pain (Lumbar bilateral )  Initial Vital Signs:  Pulse/HCG Rate: 84ECG Heart Rate: 71 Temp: (!) 96.6 F (35.9 C) Resp: 16 BP: 133/85 SpO2: 100 %  BMI: Estimated body mass index is  23.87 kg/m as calculated from the following:   Height as of this encounter: 5\' 8"  (1.727 m).   Weight as of this encounter: 157 lb (71.2 kg).  Risk Assessment: Allergies: Reviewed. She is allergic to macrobid [nitrofurantoin], codeine, hydrocodone, oxycodone, peanut-containing drug products, peanuts [peanut oil], penicillins, and tramadol.  Allergy Precautions: None required Coagulopathies: Reviewed. None identified.  Blood-thinner therapy: None at this time Active Infection(s): Reviewed. None identified. Sarah [name] is afebrile  Site Confirmation: Sarah [name] was asked to confirm the procedure and laterality before marking the site Procedure checklist: Completed Consent: Before the procedure and under the influence of no sedative(s), amnesic(s), or anxiolytics, the patient was informed of the treatment options, risks and possible complications. To fulfill our ethical and legal obligations, as recommended by the American Medical Association's Code of Ethics, I have informed the patient of my clinical impression; the nature and purpose of the treatment or procedure; the risks, benefits, and possible complications of the intervention; the alternatives, including doing nothing; the risk(s) and benefit(s) of the alternative treatment(s) or procedure(s); and the risk(s) and benefit(s) of doing nothing. The patient was provided information about the general risks and possible complications associated with the procedure. These may include, but are not limited to: failure to achieve desired goals, infection, bleeding, organ or nerve damage, allergic reactions, paralysis, and death. In addition, the patient was informed of those risks and complications associated to Spine-related procedures, such as failure to decrease pain; infection (i.e.: Meningitis, epidural or intraspinal abscess); bleeding (i.e.: epidural hematoma, subarachnoid hemorrhage, or any other type of intraspinal or peri-dural bleeding); organ or  nerve damage (i.e.: Any type of peripheral nerve, nerve root, or spinal cord injury) with subsequent damage to sensory, motor, and/or autonomic systems, resulting  in permanent pain, numbness, and/or weakness of one or several areas of the body; allergic reactions; (i.e.: anaphylactic reaction); and/or death. Furthermore, the patient was informed of those risks and complications associated with the medications. These include, but are not limited to: allergic reactions (i.e.: anaphylactic or anaphylactoid reaction(s)); adrenal axis suppression; blood sugar elevation that in diabetics may result in ketoacidosis or comma; water retention that in patients with history of congestive heart failure may result in shortness of breath, pulmonary edema, and decompensation with resultant heart failure; weight gain; swelling or edema; medication-induced neural toxicity; particulate matter embolism and blood vessel occlusion with resultant organ, and/or nervous system infarction; and/or aseptic necrosis of one or more joints. Finally, the patient was informed that Medicine is not an exact science; therefore, there is also the possibility of unforeseen or unpredictable risks and/or possible complications that may result in a catastrophic outcome. The patient indicated having understood very clearly. We have given the patient no guarantees and we have made no promises. Enough time was given to the patient to ask questions, all of which were answered to the patient's satisfaction. Sarah [name] has indicated that she wanted to continue with the procedure. Attestation: I, the ordering provider, attest that I have discussed with the patient the benefits, risks, side-effects, alternatives, likelihood of achieving goals, and potential problems during recovery for the procedure that I have provided informed consent. Date  Time: 03/06/2021  8:52 AM  Pre-Procedure Preparation:  Monitoring: As per clinic protocol. Respiration, ETCO2,  SpO2, BP, heart rate and rhythm monitor placed and checked for adequate function Safety Precautions: Patient was assessed for positional comfort and pressure points before starting the procedure. Time-out: I initiated and conducted the "Time-out" before starting the procedure, as per protocol. The patient was asked to participate by confirming the accuracy of the "Time Out" information. Verification of the correct person, site, and procedure were performed and confirmed by me, the nursing staff, and the patient. "Time-out" conducted as per Joint Commission's Universal Protocol (UP.01.01.01). Time: 0915  Description of Procedure:          Target Area: The interlaminar space, initially targeting the lower laminar border of the superior vertebral body. Approach: Paramedial approach. Area Prepped: Entire Posterior Lumbar Region Prepping solution: ChloraPrep (2% chlorhexidine gluconate and 70% isopropyl alcohol) Safety Precautions: Aspiration looking for blood return was conducted prior to all injections. At no point did we inject any substances, as a needle was being advanced. No attempts were made at seeking any paresthesias. Safe injection practices and needle disposal techniques used. Medications properly checked for expiration dates. SDV (single dose vial) medications used. Description of the Procedure: Protocol guidelines were followed. The procedure needle was introduced through the skin, ipsilateral to the reported pain, and advanced to the target area. Bone was contacted and the needle walked caudad, until the lamina was cleared. The epidural space was identified using "loss-of-resistance technique" with 2-3 ml of PF-NaCl (0.9% NSS), in a 5cc LOR glass syringe.  Vitals:   03/06/21 0856 03/06/21 0915 03/06/21 0922  BP: 133/85 135/86 (!) 144/85  Pulse: 84    Resp: 16 18 18   Temp: (!) 96.6 F (35.9 C)    TempSrc: Temporal    SpO2: 100% 100% 100%  Weight: 157 lb (71.2 kg)    Height: 5\' 8"   (1.727 m)      Start Time: 0915 hrs. End Time: 0921 hrs.  Materials:  Needle(s) Type: Epidural needle Gauge: 22G Length: 3.5-in Medication(s): Please see orders for medications  and dosing details. 5CC solution made of 2cc of preservative-free saline, 2 cc of 0.2% ropivacaine, 1 cc of Decadron 10 mg/cc. Imaging Guidance (Spinal):          Type of Imaging Technique: Fluoroscopy Guidance (Spinal) Indication(s): Assistance in needle guidance and placement for procedures requiring needle placement in or near specific anatomical locations not easily accessible without such assistance. Exposure Time: Please see nurses notes. Contrast: Before injecting any contrast, we confirmed that the patient did not have an allergy to iodine, shellfish, or radiological contrast. Once satisfactory needle placement was completed at the desired level, radiological contrast was injected. Contrast injected under live fluoroscopy. No contrast complications. See chart for type and volume of contrast used. Fluoroscopic Guidance: I was personally present during the use of fluoroscopy. "Tunnel Vision Technique" used to obtain the best possible view of the target area. Parallax error corrected before commencing the procedure. "Direction-depth-direction" technique used to introduce the needle under continuous pulsed fluoroscopy. Once target was reached, antero-posterior, oblique, and lateral fluoroscopic projection used confirm needle placement in all planes. Images permanently stored in EMR. Interpretation: I personally interpreted the imaging intraoperatively. Adequate needle placement confirmed in multiple planes. Appropriate spread of contrast into desired area was observed. No evidence of afferent or efferent intravascular uptake. No intrathecal or subarachnoid spread observed. Permanent images saved into the patient's record.   Post-operative Assessment:  Post-procedure Vital Signs:  Pulse/HCG Rate: 8468 Temp: (!) 96.6  F (35.9 C) Resp: 18 BP: (!) 144/85 SpO2: 100 %  EBL: None  Complications: No immediate post-treatment complications observed by team, or reported by patient.  Note: The patient tolerated the entire procedure well. A repeat set of vitals were taken after the procedure and the patient was kept under observation following institutional policy, for this type of procedure. Post-procedural neurological assessment was performed, showing return to baseline, prior to discharge. The patient was provided with post-procedure discharge instructions, including a section on how to identify potential problems. Should any problems arise concerning this procedure, the patient was given instructions to immediately contact us, at any time, without hesitation. In any case, we plan to contact the patient by telephone for a follow-up status report regarding this interventional procedure.  Comments:  No additional relevant information. 5 out of 5 strength bilateral lower extremity: Plantar flexion, dorsiflexion, knee flexion, knee extension.  Plan of Care  Imaging Orders  DG PAIN CLINIC C-ARM 1-60 MIN NO REPORT  Procedure Orders    No procedure(s) ordered today    Medications ordered for procedure: Meds ordered this encounter  Medications   iohexol (OMNIPAQUE) 180 MG/ML injection 10 mL    Must be Myelogram-compatible. If not available, you may substitute with a water-soluble, non-ionic, hypoallergenic, myelogram-compatible radiological contrast medium.   ropivacaine (PF) 2 mg/mL (0.2%) (NAROPIN) injection 2 mL   sodium chloride flush (NS) 0.9 % injection 2 mL   dexamethasone (DECADRON) injection 10 mg   Medications administered: We administered iohexol, ropivacaine (PF) 2 mg/mL (0.2%), sodium chloride flush, and dexamethasone.  See the medical record for exact dosing, route, and time of administration.  Disposition: Discharge home  Discharge Date & Time: 03/06/2021; 0930 hrs.   Physician-requested  Follow-up: Return in about 2 weeks (around 03/20/2021) for PPE + neck.  Future Appointments  Date Time Provider [place]  03/13/2021 10:00 AM [facility] DEXA [facility]  03/20/2021  3:00 PM Sarah Santa, MD [facility] None   Primary Care Physician: Sarah Peach, MD Location: [facility] Out[facility] Facility  Note by: Sarah Santa, MD Date: 03/06/2021; Time: 9:42 AM  Disclaimer:  Medicine is not an Chief Strategy Officer. The only guarantee in medicine is that nothing is guaranteed. It is important to note that the decision to proceed with this intervention was based on the information collected from the patient. The Data and conclusions were drawn from the patient's questionnaire, the interview, and the physical examination. Because the information was provided in large part by the patient, it cannot be guaranteed that it has not been purposely or unconsciously manipulated. Every effort has been made to obtain as much relevant data as possible for this evaluation. It is important to note that the conclusions that lead to this procedure are derived in large part from the available data. Always take into account that the treatment will also be dependent on availability of resources and existing treatment guidelines, considered by other Pain Management Practitioners as being common knowledge and practice, at the time of the intervention. For Medico-Legal purposes, it is also important to point out that variation in procedural techniques and pharmacological choices are the acceptable norm. The indications, contraindications, technique, and results of the above procedure should only be interpreted and judged by a Board-Certified Interventional Pain Specialist with extensive familiarity and expertise in the same exact procedure and technique.

## 2021-03-06 NOTE — Progress Notes (Signed)
Safety precautions to be maintained throughout the outpatient stay will include: orient to surroundings, keep bed in low position, maintain call bell within reach at all times, provide assistance with transfer out of bed and ambulation.  

## 2021-03-06 NOTE — Patient Instructions (Signed)
Pain Management Discharge Instructions  General Discharge Instructions :  If you need to reach your doctor call: Monday-Friday 8:00 am - 4:00 pm at [phone] or toll free [phone].  After clinic hours [phone] to have operator reach doctor.  Bring all of your medication bottles to all your appointments in the pain clinic.  To cancel or reschedule your appointment with Pain Management please remember to call 24 hours in advance to avoid a fee.  Refer to the educational materials which you have been given on: General Risks, I had my Procedure. Discharge Instructions, Post Sedation.  Post Procedure Instructions:  The drugs you were given will stay in your system until tomorrow, so for the next 24 hours you should not drive, make any legal decisions or drink any alcoholic beverages.  You may eat anything you prefer, but it is better to start with liquids then soups and crackers, and gradually work up to solid foods.  Please notify your doctor immediately if you have any unusual bleeding, trouble breathing or pain that is not related to your normal pain.  Depending on the type of procedure that was done, some parts of your body may feel week and/or numb.  This usually clears up by tonight or the next day.  Walk with the use of an assistive device or accompanied by an adult for the 24 hours.  You may use ice on the affected area for the first 24 hours.  Put ice in a Ziploc bag and cover with a towel and place against area 15 minutes on 15 minutes off.  You may switch to heat after 24 hours.Epidural Steroid Injection Patient Information  Description: The epidural space surrounds the nerves as they exit the spinal cord.  In some patients, the nerves can be compressed and inflamed by a bulging disc or a tight spinal canal (spinal stenosis).  By injecting steroids into the epidural space, we can bring irritated nerves into direct contact with a potentially helpful medication.  These  steroids act directly on the irritated nerves and can reduce swelling and inflammation which often leads to decreased pain.  Epidural steroids may be injected anywhere along the spine and from the neck to the low back depending upon the location of your pain.   After numbing the skin with local anesthetic (like Novocaine), a small needle is passed into the epidural space slowly.  You may experience a sensation of pressure while this is being done.  The entire block usually last less than 10 minutes.  Conditions which may be treated by epidural steroids:  Low back and leg pain Neck and arm pain Spinal stenosis Post-laminectomy syndrome Herpes zoster (shingles) pain Pain from compression fractures  Preparation for the injection:  Do not eat any solid food or dairy products within 8 hours of your appointment.  You may drink clear liquids up to 3 hours before appointment.  Clear liquids include water, black coffee, juice or soda.  No milk or cream please. You may take your regular medication, including pain medications, with a sip of water before your appointment  Diabetics should hold regular insulin (if taken separately) and take 1/2 normal NPH dos the morning of the procedure.  Carry some sugar containing items with you to your appointment. A driver must accompany you and be prepared to drive you home after your procedure.  Bring all your current medications with your. An IV may be inserted and sedation may be given at the discretion of the physician.     A blood pressure cuff, EKG and other monitors will often be applied during the procedure.  Some patients may need to have extra oxygen administered for a short period. You will be asked to provide medical information, including your allergies, prior to the procedure.  We must know immediately if you are taking blood thinners (like Coumadin/Warfarin)  Or if you are allergic to IV iodine contrast (dye). We must know if you could possible be  pregnant.  Possible side-effects: Bleeding from needle site Infection (rare, may require surgery) Nerve injury (rare) Numbness & tingling (temporary) Difficulty urinating (rare, temporary) Spinal headache ( a headache worse with upright posture) Light -headedness (temporary) Pain at injection site (several days) Decreased blood pressure (temporary) Weakness in arm/leg (temporary) Pressure sensation in back/neck (temporary)  Call if you experience: Fever/chills associated with headache or increased back/neck pain. Headache worsened by an upright position. New onset weakness or numbness of an extremity below the injection site Hives or difficulty breathing (go to the emergency room) Inflammation or drainage at the infection site Severe back/neck pain Any new symptoms which are concerning to you  Please note:  Although the local anesthetic injected can often make your back or neck feel good for several hours after the injection, the pain will likely return.  It takes 3-7 days for steroids to work in the epidural space.  You may not notice any pain relief for at least that one week.  If effective, we will often do a series of three injections spaced 3-6 weeks apart to maximally decrease your pain.  After the initial series, we generally will wait several months before considering a repeat injection of the same type.  If you have any questions, please call [phone] [facility] 

## 2021-03-07 ENCOUNTER — Telehealth: Payer: Self-pay

## 2021-03-07 NOTE — Telephone Encounter (Signed)
Post procedure phone call.  Patient states she is doing fine.  

## 2021-03-13 ENCOUNTER — Ambulatory Visit
Admission: RE | Admit: 2021-03-13 | Discharge: 2021-03-13 | Disposition: A | Discharge status: Home / Self Care | Payer: Medicare Other | Source: Ambulatory Visit | Attending: Family Medicine | Admitting: Family Medicine

## 2021-03-13 ENCOUNTER — Other Ambulatory Visit: Payer: Self-pay

## 2021-03-13 DIAGNOSIS — Z1231 Encounter for screening mammogram for malignant neoplasm of breast: Secondary | ICD-10-CM | POA: Insufficient documentation

## 2021-03-20 ENCOUNTER — Ambulatory Visit (INDEPENDENT_AMBULATORY_CARE_PROVIDER_SITE_OTHER): Payer: Medicare Other

## 2021-03-20 ENCOUNTER — Encounter: Payer: Self-pay | Admitting: Student in an Organized Health Care Education/Training Program

## 2021-03-20 ENCOUNTER — Other Ambulatory Visit: Payer: Self-pay

## 2021-03-20 ENCOUNTER — Ambulatory Visit
Admission: EM | Admit: 2021-03-20 | Discharge: 2021-03-20 | Disposition: A | Discharge status: Home / Self Care | Payer: Medicare Other | Attending: Family Medicine | Admitting: Family Medicine

## 2021-03-20 ENCOUNTER — Ambulatory Visit
Payer: Medicare Other | Attending: Student in an Organized Health Care Education/Training Program | Admitting: Student in an Organized Health Care Education/Training Program

## 2021-03-20 DIAGNOSIS — J189 Pneumonia, unspecified organism: Secondary | ICD-10-CM | POA: Diagnosis not present

## 2021-03-20 DIAGNOSIS — R0602 Shortness of breath: Secondary | ICD-10-CM

## 2021-03-20 DIAGNOSIS — M5126 Other intervertebral disc displacement, lumbar region: Secondary | ICD-10-CM | POA: Diagnosis not present

## 2021-03-20 DIAGNOSIS — G894 Chronic pain syndrome: Secondary | ICD-10-CM | POA: Diagnosis not present

## 2021-03-20 DIAGNOSIS — R059 Cough, unspecified: Secondary | ICD-10-CM

## 2021-03-20 DIAGNOSIS — M5416 Radiculopathy, lumbar region: Secondary | ICD-10-CM | POA: Diagnosis not present

## 2021-03-20 MED ORDER — CEFDINIR 300 MG PO CAPS: 300.0000 mg | ORAL_CAPSULE | Freq: Two times a day (BID) | ORAL | 0 refills | Status: DC

## 2021-03-20 MED ORDER — AZITHROMYCIN 250 MG PO TABS
ORAL_TABLET | ORAL | 0 refills | Status: DC
Start: 2021-03-20 — End: 2021-04-19

## 2021-03-20 NOTE — ED Provider Notes (Signed)
[facility]    CSN: [id number] Arrival date & time: 03/20/21  0800      History   Chief Complaint Chief Complaint  Patient presents with   Cough   Shortness of Breath    HPI  87 year old female presents with the above complaints.  Patient reports that she has had a dry cough for the past 8 days.  She states that last night she developed right-sided lower rib pain as well as right sided upper chest pain.  Worse with taking deep breaths.  She states that she was short of breath and had to sit up in a chair last night.  Patient reports that she recently had a fall on Monday.  She states that she landed on her left side.  Denies fever.  No relieving factors.  Her pain is currently 5/10 in severity.  Worse with taking a deep breath.  Patient states that she has had pleurisy before and it feels the same as this.  No other complaints.  Past Medical History:  Diagnosis Date   Benign essential HTN 09/21/2015   Cancer ([place])    Skin CA resected from Right eyelid and both legs.   Seizures ([place])    Thyroid disease     Patient Active Problem List   Diagnosis Date Noted   Bilateral occipital neuralgia 09/24/2020   Cervical facet joint syndrome 03/05/2020   Cervical spondylosis 03/01/2020   SI joint arthritis 06/23/2019   Chronic SI joint pain 06/23/2019   Carpal tunnel syndrome 06/07/2019   Low back pain 06/07/2019   Major depressive disorder with single episode, in full remission ([place]) 06/07/2019   Osteoarthritis of knee 06/07/2019   Pain in limb 06/07/2019   SOBOE (shortness of breath on exertion) 10/14/2018   Lumbar radiculopathy 07/22/2018   Lumbar disc herniation (L2/L3) 07/22/2018   Lumbar degenerative disc disease 07/22/2018   Chronic pain syndrome 07/22/2018   Neuropathy 07/22/2018   Localized, primary osteoarthritis 01/14/2018   Trochanteric bursitis of left hip 01/14/2018   Closed fracture of fifth toe of right foot 04/17/2017   Left leg pain 04/24/2016    Mixed hyperlipidemia 02/05/2016   History of fall 01/24/2016   Sensory ataxia 01/24/2016   Benign essential HTN 09/21/2015   Moderate tricuspid insufficiency 09/21/2015   Numbness in feet 04/12/2015   Mild vitamin D deficiency 10/23/2014   Allergic rhinitis 05/15/2014   Hip pain 09/06/2013   Dysphagia 04/15/2013   Squamous cell carcinoma 04/15/2013   Thyroid nodule 04/15/2013   Lumbar facet arthropathy 12/07/2012   Stenosis of lumbosacral spine 12/07/2012   IBS (irritable bowel syndrome) 04/12/2012   Acquired hypothyroidism 06/24/2011   Osteopenia 05/14/2011    Past Surgical History:  Procedure Laterality Date   ABDOMINAL HYSTERECTOMY     BREAST BIOPSY Bilateral    neg    OB History   No obstetric history on file.      Home Medications    Prior to Admission medications   Medication Sig Start Date End Date Taking? Authorizing Provider  azithromycin (ZITHROMAX) 250 MG tablet 2 tablets on day 1, then 1 tablet daily on days 2-5. 03/20/21  Yes Jakaria Lavergne G, DO  cefdinir (OMNICEF) 300 MG capsule Take 1 capsule (300 mg total) by mouth 2 (two) times daily. 03/20/21  Yes Ismar Yabut G, DO  acetaminophen (TYLENOL) 500 MG tablet Take 1,000 mg by mouth every 6 (six) hours as needed.    [provider]  alendronate (FOSAMAX) 70 MG  tablet Take 70 mg by mouth every 7 (seven) days. 01/26/21   [provider]  beta carotene w/minerals (OCUVITE) tablet Take 1 tablet by mouth daily.    [provider]  bimatoprost (LUMIGAN) 0.03 % ophthalmic solution Place 1 drop into both eyes at bedtime.    [provider]  ciprofloxacin (CIPRO) 500 MG tablet Take 500 mg by mouth at bedtime. Prior to dentist 10/09/20   [provider]  dicyclomine (BENTYL) 10 MG capsule Take 10 mg by mouth 4 (four) times daily -  before meals and at bedtime.     [provider]  donepezil (ARICEPT) 5 MG tablet Take 5 mg by mouth at bedtime. 02/08/21   [provider]  DULoxetine (CYMBALTA) 20 MG capsule Take 20 mg by mouth daily.  03/15/20   [provider]  estradiol (ESTRACE VAGINAL) 0.1 MG/GM vaginal cream Apply one pea-sized amount around the opening of the urethra three times weekly. 05/21/20   Vaillancourt, Aldona Bar, PA-C  gabapentin (NEURONTIN) 300 MG capsule Take by mouth 4 (four) times daily. 07/19/20 07/19/21  [provider]  latanoprost (XALATAN) 0.005 % ophthalmic solution  09/19/20   [provider]  levothyroxine (SYNTHROID) 100 MCG tablet Take 100 mcg by mouth daily. 09/05/20   [provider]  lovastatin (MEVACOR) 20 MG tablet Take 20 mg by mouth daily. 01/04/20   [provider]  LUMIGAN 0.01 % SOLN  12/25/20   [provider]  Melatonin 1 MG TABS Take 10 mg by mouth once.    [provider]  montelukast (SINGULAIR) 10 MG tablet Take 10 mg by mouth at bedtime.    [provider]  nortriptyline (PAMELOR) 10 MG capsule Take 10 mg by mouth at bedtime. 01/16/21   [provider]  polyethylene glycol powder (GLYCOLAX/MIRALAX) 17 GM/SCOOP powder Take by mouth. 03/29/20   [provider]  Probiotic, Lactobacillus, CAPS Take by mouth.    [provider]  simvastatin (ZOCOR) 10 MG tablet Take 10 mg by mouth at bedtime.     [provider]  Trospium Chloride 60 MG CP24 TAKE 1 CAPSULE BY MOUTH EVERY DAY 11/06/20   Debroah Loop, PA-C    Family History Family History  Problem Relation Age of Onset   Breast cancer Mother 66   Breast cancer Maternal Uncle 60   Breast cancer Maternal Aunt        mat great aunt   Breast cancer Maternal Aunt 62   Tracheal cancer Father    Cancer Sister    Brain cancer Sister    Cancer Brother     Social History Social History   Tobacco Use   Smoking status: Never   Smokeless tobacco: Never  Vaping Use   Vaping Use: Never used  Substance Use Topics   Alcohol use: No     Allergies   Macrobid  [nitrofurantoin], Codeine, Hydrocodone, Oxycodone, Peanut-containing drug products, Peanuts [peanut oil], Penicillins, and Tramadol   Review of Systems Review of Systems  Constitutional:  Negative for fever.  Respiratory:  Positive for cough and shortness of breath.    Physical Exam Triage Vital Signs ED Triage Vitals  Enc Vitals Group     BP 03/20/21 0818 115/66     Pulse Rate 03/20/21 0818 97     Resp 03/20/21 0818 16     Temp 03/20/21 0818 98.6 F (37 C)     Temp Source 03/20/21 0818 Oral     SpO2 03/20/21 0818  100 %     Weight --      Height --      Head Circumference --      Peak Flow --      Pain Score 03/20/21 0821 5     Pain Loc --      Pain Edu? --      Excl. in [place]? --    Updated Vital Signs BP 115/66 (BP Location: Right Arm)   Pulse 97   Temp 98.6 F (37 C) (Oral)   Resp 16   SpO2 100%   Visual Acuity Right Eye Distance:   Left Eye Distance:   Bilateral Distance:    Right Eye Near:   Left Eye Near:    Bilateral Near:     Physical Exam Vitals and nursing note reviewed.  Constitutional:      General: She is not in acute distress.    Appearance: She is well-developed. She is not ill-appearing.  HENT:     Head: Normocephalic and atraumatic.  Eyes:     General:        Right eye: No discharge.        Left eye: No discharge.     Conjunctiva/sclera: Conjunctivae normal.  Cardiovascular:     Rate and Rhythm: Normal rate and regular rhythm.     Heart sounds: No murmur heard. Pulmonary:     Effort: Pulmonary effort is normal.     Breath sounds: Normal breath sounds. No wheezing or rales.  Chest:     Chest wall: Tenderness present.  Neurological:     Mental Status: She is alert.  Psychiatric:        Mood and Affect: Mood normal.        Behavior: Behavior normal.     UC Treatments / Results  Labs (all labs ordered are listed, but only abnormal results are displayed) Labs Reviewed - No data to display  EKG   Radiology DG Chest 2  View  Result Date: 03/20/2021 CLINICAL DATA:  Cough and shortness of breath EXAM: CHEST - 2 VIEW COMPARISON:  09/14/2015 FINDINGS: Cardiac shadow is within normal limits. Aortic calcifications are noted. Right upper lobe infiltrate is seen along the minor fissure. No sizable effusion is seen. Degenerative changes of the thoracic spine are noted. IMPRESSION: Mild right upper lobe pneumonia. Followup PA and lateral chest X-ray is recommended in 3-4 weeks following trial of antibiotic therapy to ensure resolution and exclude underlying malignancy. Electronically Signed   By: Inez Catalina M.D.   On: 03/20/2021 08:53    Procedures Procedures (including critical care time)  Medications Ordered in UC Medications - No data to display  Initial Impression / Assessment and Plan / UC Course  I have reviewed the triage vital signs and the nursing notes.  Pertinent labs & imaging results that were available during my care of the patient were reviewed by me and considered in my medical decision making (see chart for details).    87 year old female presents for evaluation of cough and shortness of breath.  Chest x-ray was obtained and was independently reviewed by me.  Independent interpretation: Right upper lobe infiltrate.  Placing on Omnicef and azithromycin.  Omnicef was used due to penicillin allergy.  Patient needs follow-up x-ray in 3 to 4 weeks.   Final Clinical Impressions(s) / UC Diagnoses   Final diagnoses:  Community acquired pneumonia of right upper lobe of lung   Discharge Instructions   None    ED Prescriptions  Medication Sig Dispense Auth. Provider   cefdinir (OMNICEF) 300 MG capsule Take 1 capsule (300 mg total) by mouth 2 (two) times daily. 14 capsule Ashlin Kreps G, DO   azithromycin (ZITHROMAX) 250 MG tablet 2 tablets on day 1, then 1 tablet daily on days 2-5. 6 tablet Coral Spikes, DO      PDMP not reviewed this encounter.   Thersa Salt [place], [place] 03/20/21 [phone]

## 2021-03-20 NOTE — Progress Notes (Signed)
Patient: Sarah [name]  Service Category: E/M  Provider: Gillis Santa, MD  DOB: 11-Feb-1932  DOS: 03/20/2021  Location: Office  MRN: [id number]  Setting: Ambulatory outpatient  Referring Provider: Sharyne Peach, MD  Type: Established Patient  Specialty: Interventional Pain Management  PCP: Sharyne Peach, MD  Location: Remote location  Delivery: TeleHealth     Virtual Encounter - Pain Management PROVIDER NOTE: Information contained herein reflects review and annotations entered in association with encounter. Interpretation of such information and data should be left to medically-trained personnel. Information provided to patient can be located elsewhere in the medical record under "Patient Instructions". Document created using STT-dictation technology, any transcriptional errors that may result from process are unintentional.    Contact & Pharmacy Preferred: [phone] Home: [phone] (home) Mobile: [phone] (mobile) E-mail: [email or web address]  [id number] [place] [address]Phone: [phone]: [phone] [facility] - [address]Phone: [phone]: [phone]  Pre-screening  [name] "in-person" vs "virtual" encounter. She indicated preferring virtual for this encounter.   Reason COVID-19*  Social distancing based on CDC and AMA recommendations.   I contacted PEdwena Bundeon 03/20/2021 via video conference.      I clearly identified myself as BGillis Santa MD. I verified that I was speaking with the correct person using two identifiers (Name: [name] and date of birth: [birth year].  Consent I sought verbal advanced consent from PEdwena [name] virtual visit interactions. I informed Ms. GAguirreof possible security and privacy concerns, risks, and limitations  associated with providing "not-in-person" medical evaluation and management services. I also informed Ms. GBronaughof the availability of "in-person" appointments. Finally, I informed her that there would be a charge for the virtual visit and that she could be  personally, fully or partially, financially responsible for it. Sarah [name] understanding and agreed to proceed.   Historic Elements   Ms. Sarah Needleis a 87y.o. year old, female patient evaluated today after our last contact on 03/06/2021. Ms. [name] has a past medical history of Benign essential HTN (09/21/2015), Cancer ([place]ell [name], Seizures ([place]le[name], and Thyroid disease. She also  has a past surgical history that includes Abdominal hysterectomy and Breast biopsy (Bilateral). Ms. GAbadihas a current medication list which includes the following prescription(s): acetaminophen, alendronate, beta carotene w/minerals, bimatoprost, ciprofloxacin, dicyclomine, donepezil, duloxetine, estradiol, gabapentin, latanoprost, levothyroxine, lovastatin, lumigan, melatonin, montelukast, nortriptyline, polyethylene glycol powder, probiotic (lactobacillus), simvastatin, trospium chloride, azithromycin, and cefdinir. She  reports that she has never smoked. She has never used smokeless tobacco. She reports that she does not drink alcohol. No history on file for drug use. Ms. [name] allergic to macrobid [nitrofurantoin], codeine, hydrocodone, oxycodone, peanut-containing drug products, peanuts [peanut oil], penicillins, and tramadol.   HPI  Today, she is being contacted for a post-procedure assessment.   Post-Procedure Evaluation  Procedure (03/06/2021): Type: Therapeutic Inter-Laminar Epidural Steroid Injection  #1 in 2022 Region: Lumbar Level: L2-3 Level. Laterality: Midline      Anxiolysis: Please see nurses note.  Effectiveness during initial hour after procedure (Ultra-Short Term Relief): 100 %   Local anesthetic used: Long-acting (4-6  hours) Effectiveness: Defined as any analgesic benefit obtained secondary to the administration of local anesthetics. This carries significant diagnostic value as to the etiological location, or anatomical origin, of the pain.  Duration of benefit is expected to coincide with the duration of the local anesthetic used.  Effectiveness during initial 4-6 hours after procedure (Short-Term Relief): 100 %   Long-term benefit: Defined as any relief past the pharmacologic duration of the local anesthetics.  Effectiveness past the initial 6 hours after procedure (Long-Term Relief): 100 % (good pain relief x 1 week and is still doing well unless she over does it.)   Benefits, current: Defined as benefit present at the time of this evaluation.   Analgesia:  >75% relief Function: Somewhat improved   Laboratory Chemistry Profile   Renal Lab Results  Component Value Date   BUN 23 (H) 09/14/2015   CREATININE 0.80 09/12/2020   GFRAA 77 09/12/2020   GFRNONAA 67 09/12/2020    Hepatic Lab Results  Component Value Date   AST 19 11/26/2015   ALT 15 11/26/2015   ALBUMIN 4.7 11/26/2015   ALKPHOS 64 11/26/2015    Electrolytes Lab Results  Component Value Date   NA 141 09/14/2015   K 3.5 09/14/2015   CL 107 09/14/2015   CALCIUM 9.7 09/14/2015   MG 2.2 09/14/2015    Bone No results found for: VD25OH, VD125OH2TOT, BL3903ES9, QZ3007MA2, 25OHVITD1, 25OHVITD2, 25OHVITD3, TESTOFREE, TESTOSTERONE  Inflammation (CRP: Acute Phase) (ESR: Chronic Phase) No results found for: CRP, ESRSEDRATE, LATICACIDVEN        Note: Above Lab results reviewed.  Imaging  DG Chest 2 View CLINICAL DATA:  Cough and shortness of breath  EXAM: CHEST - 2 VIEW  COMPARISON:  09/14/2015  FINDINGS: Cardiac shadow is within normal limits. Aortic calcifications are noted. Right upper lobe infiltrate is seen along the minor fissure. No sizable effusion is seen. Degenerative changes of the thoracic spine are  noted.  IMPRESSION: Mild right upper lobe pneumonia. Followup PA and lateral chest X-ray is recommended in 3-4 weeks following trial of antibiotic therapy to ensure resolution and exclude underlying malignancy.  Electronically Signed   By: Inez Catalina M.D.   On: 03/20/2021 08:53  Assessment  The primary encounter diagnosis was Lumbar radiculopathy. Diagnoses of Lumbar disc herniation (L2/L3) and Chronic pain syndrome were also pertinent to this visit.  Plan of Care  Adequate pain relief after L-ESI. States that she is moving around better and able to do ADLs in less pain. Follow up PRN  Follow-up plan:   Return if symptoms worsen or fail to improve.     Finds benefit with bilateral sacroiliac joint injection, previous one 03/14/2019 and then second 1 05/25/2019.  Also finds lumbar epidural steroid injections for when she has sciatic flare, consider repeating.  Previous one performed on 10/13/2018 at L2-L3.  Status post bilateral C4, C5, C6, C7 cervical facet medial branch nerve block on 03/05/2020, 05/21/20 for cervical facet arthropathy. Bilateral greater occipital nerve block 08/13/2020 helped significantly, repeat as needed.         Recent Visits Date Type Provider Dept  03/06/21 Procedure visit Gillis Santa, MD [facility]  02/25/21 Office Visit Gillis Santa, MD [facility]  Showing recent visits within past 90 days and meeting all other requirements Today's Visits Date Type Provider Dept  03/20/21 Telemedicine Gillis Santa, MD [facility]  Showing today's visits and meeting all other requirements Future Appointments No visits were found meeting these conditions. Showing future appointments within next 90 days and meeting all other requirements I discussed the assessment and treatment plan with the patient. The patient was provided an opportunity to ask questions and all were  answered. The patient agreed with the plan and demonstrated an understanding  of the instructions.  Patient advised to call back or seek an in-person evaluation if the symptoms or condition worsens.  Duration of encounter: 43mnutes.  Note by: BGillis Santa MD Date: 03/20/2021; Time: 2:25 PM

## 2021-03-20 NOTE — ED Triage Notes (Signed)
Patient presents to Urgent Care with complaints of chest congestion and dry cough x 8 days. She states the cough has gotten worse and has developed pain when taking deep breaths She also reports falling weds and has developed some pain in rib area. Not treating symptoms with any meds.   Denies fever.

## 2021-04-19 ENCOUNTER — Ambulatory Visit
Admission: EM | Admit: 2021-04-19 | Discharge: 2021-04-19 | Disposition: A | Discharge status: Home / Self Care | Payer: Medicare Other | Attending: Emergency Medicine | Admitting: Emergency Medicine

## 2021-04-19 ENCOUNTER — Ambulatory Visit (INDEPENDENT_AMBULATORY_CARE_PROVIDER_SITE_OTHER): Payer: Medicare Other

## 2021-04-19 ENCOUNTER — Other Ambulatory Visit: Payer: Self-pay

## 2021-04-19 DIAGNOSIS — R0600 Dyspnea, unspecified: Secondary | ICD-10-CM | POA: Diagnosis not present

## 2021-04-19 DIAGNOSIS — R0609 Other forms of dyspnea: Secondary | ICD-10-CM

## 2021-04-19 NOTE — ED Provider Notes (Signed)
[facility]    CSN: [id number] Arrival date & time: 04/19/21  1027      History   Chief Complaint Chief Complaint  Patient presents with   Shortness of Breath    HPI Sarah [name] is a 87 y.o. female.   HPI  87 year old female here for evaluation of shortness of breath.  Patient was diagnosed with community-acquired pneumonia exactly 1 month ago.  She states that she is not having any cough or wheezing, no fever, no URI symptoms.  She states that she has shortness of breath with any exertion.  She denies chest pain or leg swelling.  She has no shortness of breath at rest.  Past Medical History:  Diagnosis Date   Benign essential HTN 09/21/2015   Cancer ([place])    Skin CA resected from Right eyelid and both legs.   Seizures ([place])    Thyroid disease     Patient Active Problem List   Diagnosis Date Noted   Bilateral occipital neuralgia 09/24/2020   Cervical facet joint syndrome 03/05/2020   Cervical spondylosis 03/01/2020   SI joint arthritis 06/23/2019   Chronic SI joint pain 06/23/2019   Carpal tunnel syndrome 06/07/2019   Low back pain 06/07/2019   Major depressive disorder with single episode, in full remission ([place]) 06/07/2019   Osteoarthritis of knee 06/07/2019   Pain in limb 06/07/2019   SOBOE (shortness of breath on exertion) 10/14/2018   Lumbar radiculopathy 07/22/2018   Lumbar disc herniation (L2/L3) 07/22/2018   Lumbar degenerative disc disease 07/22/2018   Chronic pain syndrome 07/22/2018   Neuropathy 07/22/2018   Localized, primary osteoarthritis 01/14/2018   Trochanteric bursitis of left hip 01/14/2018   Closed fracture of fifth toe of right foot 04/17/2017   Left leg pain 04/24/2016   Mixed hyperlipidemia 02/05/2016   History of fall 01/24/2016   Sensory ataxia 01/24/2016   Benign essential HTN 09/21/2015   Moderate tricuspid insufficiency 09/21/2015   Numbness in feet 04/12/2015   Mild vitamin D deficiency 10/23/2014    Allergic rhinitis 05/15/2014   Hip pain 09/06/2013   Dysphagia 04/15/2013   Squamous cell carcinoma 04/15/2013   Thyroid nodule 04/15/2013   Lumbar facet arthropathy 12/07/2012   Stenosis of lumbosacral spine 12/07/2012   IBS (irritable bowel syndrome) 04/12/2012   Acquired hypothyroidism 06/24/2011   Osteopenia 05/14/2011    Past Surgical History:  Procedure Laterality Date   ABDOMINAL HYSTERECTOMY     BREAST BIOPSY Bilateral    neg    OB History   No obstetric history on file.      Home Medications    Prior to Admission medications   Medication Sig Start Date End Date Taking? Authorizing Provider  acetaminophen (TYLENOL) 500 MG tablet Take 1,000 mg by mouth every 6 (six) hours as needed.   Yes [provider]  alendronate (FOSAMAX) 70 MG tablet Take 70 mg by mouth every 7 (seven) days. 01/26/21  Yes [provider]  beta carotene w/minerals (OCUVITE) tablet Take 1 tablet by mouth daily.   Yes [provider]  bimatoprost (LUMIGAN) 0.03 % ophthalmic solution Place 1 drop into both eyes at bedtime.   Yes [provider]  ciprofloxacin (CIPRO) 500 MG tablet Take 500 mg by mouth at bedtime. Prior to dentist 10/09/20  Yes [provider]  dicyclomine (BENTYL) 10 MG capsule Take 10 mg by mouth 4 (four) times daily -  before meals and at bedtime.    Yes [provider]  donepezil (  ARICEPT) 5 MG tablet Take 5 mg by mouth at bedtime. 02/08/21  Yes [provider]  DULoxetine (CYMBALTA) 20 MG capsule Take 20 mg by mouth daily.  03/15/20  Yes [provider]  estradiol (ESTRACE VAGINAL) 0.1 MG/GM vaginal cream Apply one pea-sized amount around the opening of the urethra three times weekly. 05/21/20  Yes Vaillancourt, Samantha, PA-C  gabapentin (NEURONTIN) 300 MG capsule Take by mouth 4 (four) times daily. 07/19/20 07/19/21 Yes [provider]  latanoprost (XALATAN) 0.005 % ophthalmic solution  09/19/20  Yes  [provider]  levothyroxine (SYNTHROID) 100 MCG tablet Take 100 mcg by mouth daily. 09/05/20  Yes [provider]  lovastatin (MEVACOR) 20 MG tablet Take 20 mg by mouth daily. 01/04/20  Yes [provider]  LUMIGAN 0.01 % SOLN  12/25/20  Yes [provider]  Melatonin 1 MG TABS Take 10 mg by mouth once.   Yes [provider]  montelukast (SINGULAIR) 10 MG tablet Take 10 mg by mouth at bedtime.   Yes [provider]  nortriptyline (PAMELOR) 10 MG capsule Take 10 mg by mouth at bedtime. 01/16/21  Yes [provider]  polyethylene glycol powder (GLYCOLAX/MIRALAX) 17 GM/SCOOP powder Take by mouth. 03/29/20  Yes [provider]  Probiotic, Lactobacillus, CAPS Take by mouth.   Yes [provider]  simvastatin (ZOCOR) 10 MG tablet Take 10 mg by mouth at bedtime.    Yes [provider]  Trospium Chloride 60 MG CP24 TAKE 1 CAPSULE BY MOUTH EVERY DAY 11/06/20  Yes Vaillancourt, Samantha, PA-C  azithromycin (ZITHROMAX) 250 MG tablet 2 tablets on day 1, then 1 tablet daily on days 2-5. 03/20/21   Coral Spikes, DO  cefdinir (OMNICEF) 300 MG capsule Take 1 capsule (300 mg total) by mouth 2 (two) times daily. 03/20/21   Coral Spikes, DO    Family History Family History  Problem Relation Age of Onset   Breast cancer Mother 83   Breast cancer Maternal Uncle 60   Breast cancer Maternal Aunt        mat great aunt   Breast cancer Maternal Aunt 23   Tracheal cancer Father    Cancer Sister    Brain cancer Sister    Cancer Brother     Social History Social History   Tobacco Use   Smoking status: Never   Smokeless tobacco: Never  Vaping Use   Vaping Use: Never used  Substance Use Topics   Alcohol use: No     Allergies   Macrobid [nitrofurantoin], Codeine, Hydrocodone, Oxycodone, Peanut-containing drug products, Peanuts [peanut oil], Penicillins, and Tramadol   Review of Systems Review of Systems   Constitutional:  Negative for fever.  HENT:  Negative for congestion and rhinorrhea.   Respiratory:  Positive for shortness of breath. Negative for cough and wheezing.   Cardiovascular:  Negative for chest pain, palpitations and leg swelling.  Hematological: Negative.   Psychiatric/Behavioral: Negative.      Physical Exam Triage Vital Signs ED Triage Vitals  Enc Vitals Group     BP 04/19/21 1046 124/84     Pulse Rate 04/19/21 1046 97     Resp 04/19/21 1046 18     Temp 04/19/21 1046 98.4 F (36.9 C)     Temp Source 04/19/21 1046 Oral     SpO2 04/19/21 1046 97 %     Weight 04/19/21 1044 156 lb (70.8 kg)     Height 04/19/21 1044 '5\' 8"'$  (1.727 m)  Head Circumference --      Peak Flow --      Pain Score 04/19/21 1043 0     Pain Loc --      Pain Edu? --      Excl. in [place]? --    No data found.  Updated Vital Signs BP 124/84 (BP Location: Left Arm)   Pulse 97   Temp 98.4 F (36.9 C) (Oral)   Resp 18   Ht '5\' 8"'$  (1.727 m)   Wt 156 lb (70.8 kg)   SpO2 97%   BMI 23.72 kg/m   Visual Acuity Right Eye Distance:   Left Eye Distance:   Bilateral Distance:    Right Eye Near:   Left Eye Near:    Bilateral Near:     Physical Exam Vitals and nursing note reviewed.  Constitutional:      General: She is not in acute distress.    Appearance: She is well-developed and normal weight. She is not ill-appearing.  HENT:     Head: Normocephalic and atraumatic.  Cardiovascular:     Rate and Rhythm: Normal rate and regular rhythm.  Pulmonary:     Effort: Pulmonary effort is normal. No respiratory distress.     Breath sounds: Normal breath sounds. No decreased breath sounds, wheezing, rhonchi or rales.  Skin:    General: Skin is warm and dry.     Capillary Refill: Capillary refill takes less than 2 seconds.     Findings: No rash.  Neurological:     General: No focal deficit present.     Mental Status: She is alert and oriented to person, place, and time.  Psychiatric:         Mood and Affect: Mood normal.        Behavior: Behavior normal.     UC Treatments / Results  Labs (all labs ordered are listed, but only abnormal results are displayed) Labs Reviewed - No data to display  EKG   Radiology DG Chest 2 View  Result Date: 04/19/2021 CLINICAL DATA:  Dyspnea. EXAM: CHEST - 2 VIEW COMPARISON:  March 20, 2021. FINDINGS: The heart size and mediastinal contours are within normal limits. Both lungs are clear. Right upper lobe opacity noted on prior exam has resolved. The visualized skeletal structures are unremarkable. IMPRESSION: No active cardiopulmonary disease. Electronically Signed   By: Marijo Conception M.D.   On: 04/19/2021 12:06    Procedures Procedures (including critical care time)  Medications Ordered in UC Medications - No data to display  Initial Impression / Assessment and Plan / UC Course  I have reviewed the triage vital signs and the nursing notes.  Pertinent labs & imaging results that were available during my care of the patient were reviewed by me and considered in my medical decision making (see chart for details).  Patient is a very pleasant and nontoxic-appearing 87 year old female who is here for evaluation of shortness of breath on exertion.  She states that she did not have any shortness of breath on exertion until after she was diagnosed with pneumonia 1 month ago.  Per epic she was evaluated by cardiology 2 days before her pneumonia diagnosis for shortness of breath on exertion.  Patient denies any cough, fever, or URI symptoms.  Patient's physical exam reveals S1-S2 heart sounds and clear lung sounds in all fields.  Patient has normal respiratory excursion upper chest wall.  She is not tachypneic or bradycardia apneic.  Her oxygen saturation 97% on room  air.  Will repeat chest x-ray to ensure resolution of pneumonia and look for the presence of scarring.  If chest x-ray is normal will refer patient back to her primary care provider for  referral to pulmonology for possible pulmonary rehab.  Chest x-ray independently reviewed and evaluated by me.  Interpretation: Lung fields are well-pneumatized.  There is no evidence of consolidation, infiltrate, or effusion.  Awaiting radiology overread. Radiology interpretation indicates a negative chest x-ray.  Cardiology note from Dr. Alveria Apley office on 03/18/2021 indicates patient has been dealing with acute on chronic dyspnea and shortness of breath with increased severity over the last 8 months occurring with mild exertion and limits activities of daily living.  Unsure that patient's current dyspnea is new onset and may be more of a chronic issue.  Part of the plan at that time was discussion of physical and occupational rehabilitation.  Will discharge patient to follow-up with her primary care doctor for discussion about physical and occupational rehab as well as possible pulmonology evaluation.   Final Clinical Impressions(s) / UC Diagnoses   Final diagnoses:  Dyspnea on exertion     Discharge Instructions      Your chest x-ray is negative for any acute process and does not make mention of any residual scarring in your right upper lobe where your pneumonia once was.  Per Dr. Alveria Apley note on 03/18/2021 there had been some prior acute on chronic shortness of breath with exertion and discussion of possible physical and Occupational Therapy.  I would make a follow-up appointment with your primary care provider to discuss physical and/or occupational therapy as well as a pulmonology referral for evaluation of your continued shortness of breath with exertion.     ED Prescriptions   None    PDMP not reviewed this encounter.   Margarette Canada, NP 04/19/21 1224

## 2021-04-19 NOTE — Discharge Instructions (Addendum)
Your chest x-ray is negative for any acute process and does not make mention of any residual scarring in your right upper lobe where your pneumonia once was.  Per Dr. Alveria Apley note on 03/18/2021 there had been some prior acute on chronic shortness of breath with exertion and discussion of possible physical and Occupational Therapy.  I would make a follow-up appointment with your primary care provider to discuss physical and/or occupational therapy as well as a pulmonology referral for evaluation of your continued shortness of breath with exertion.

## 2021-04-19 NOTE — ED Triage Notes (Signed)
Patient states that she was diagnosed with a pneumonia last month. States that she feels like her shortness of breath is worsening. States that she is not coughing.

## 2021-06-05 ENCOUNTER — Other Ambulatory Visit: Payer: Self-pay | Admitting: Physician Assistant

## 2021-06-05 DIAGNOSIS — N952 Postmenopausal atrophic vaginitis: Secondary | ICD-10-CM

## 2021-06-21 ENCOUNTER — Other Ambulatory Visit: Payer: Self-pay | Admitting: Family Medicine

## 2021-06-21 DIAGNOSIS — E041 Nontoxic single thyroid nodule: Secondary | ICD-10-CM

## 2021-06-24 ENCOUNTER — Other Ambulatory Visit: Payer: Self-pay | Admitting: *Deleted

## 2021-06-24 ENCOUNTER — Other Ambulatory Visit
Admission: RE | Admit: 2021-06-24 | Discharge: 2021-06-24 | Disposition: A | Discharge status: Home / Self Care | Payer: Medicare Other | Attending: Urology | Admitting: Urology

## 2021-06-24 ENCOUNTER — Encounter: Payer: Self-pay | Admitting: Urology

## 2021-06-24 ENCOUNTER — Ambulatory Visit (INDEPENDENT_AMBULATORY_CARE_PROVIDER_SITE_OTHER): Payer: Medicare Other | Admitting: Urology

## 2021-06-24 ENCOUNTER — Other Ambulatory Visit: Payer: Self-pay

## 2021-06-24 VITALS — BP 133/80 | HR 80 | Ht 68.0 in | Wt 163.0 lb

## 2021-06-24 DIAGNOSIS — N390 Urinary tract infection, site not specified: Secondary | ICD-10-CM

## 2021-06-24 DIAGNOSIS — R300 Dysuria: Secondary | ICD-10-CM

## 2021-06-24 LAB — URINALYSIS, COMPLETE (UACMP) WITH MICROSCOPIC
Bilirubin Urine: NEGATIVE
Glucose, UA: NEGATIVE mg/dL
Hgb urine dipstick: NEGATIVE
Ketones, ur: NEGATIVE mg/dL
Nitrite: NEGATIVE
Protein, ur: NEGATIVE mg/dL
Specific Gravity, Urine: 1.02 (ref 1.005–1.030)
WBC, UA: >50 WBC/hpf (ref 0–5)
pH: 6.5 (ref 5.0–8.0)

## 2021-06-24 MED ORDER — SULFAMETHOXAZOLE-TRIMETHOPRIM 800-160 MG PO TABS: 1.0000 | ORAL_TABLET | Freq: Two times a day (BID) | ORAL | 0 refills | Status: DC

## 2021-06-24 NOTE — Progress Notes (Signed)
06/24/2021 1:18 PM   Sarah [name] Jul 24, 1932 [id number]  Referring provider: Sharyne Peach, MD [facility] [place],  [place] [address]  Chief Complaint  Patient presents with   Urinary Tract Infection    Urological history: 1. OAB - on trospium XL 60 mg daily  -pelvic sling > 20 years ago  2. Vaginal atrophy - could not tolerate vaginal estrogen cream  3. High risk hematuria -non-smoker -CTU 09/2020 NED -cysto 09/2020 NED -no reports of gross heme -UA neg for micro heme  4. rUTI's -contributing factors of age, vaginal atrophy, loose stools and constipation -documented positive urine cultures over the last year  E.coli resistant to ampicillin, gentamicin, tetracycline and  trimethoprim/sulfa on 12/11/2020  Pan sensitive E.coli on 10/09/2020  E.coli resistant to trimethoprim/sulfa on 09/12/2020  Multiple species on 08/13/2020 -managed with cranberry tablets and vaginal estrogen cream -UA > 50 WBC's, few bacteria and WBC clumps   HPI: Sarah [name] is a 87 y.o. who presets today for burning with urination and pain.   She started to experience symptoms Friday consisting of urge incontinence, frequency, urgency, supra pubic discomfort and dysuria.    Patient denies any modifying or aggravating factors.  Patient denies any gross hematuria, or flank pain.  Patient denies any fevers, chills, nausea or vomiting.    She continues with cranberry tablets and cranberry juice.   UA > 50 WBC's, few bacteria and WBC clumps   PMH: Past Medical History:  Diagnosis Date   Benign essential HTN 09/21/2015   Cancer ([place])    Skin CA resected from Right eyelid and both legs.   Seizures ([place])    Thyroid disease     Surgical History: Past Surgical History:  Procedure Laterality Date   ABDOMINAL HYSTERECTOMY     BREAST BIOPSY Bilateral    neg    Home Medications:  Allergies as of 06/24/2021       Reactions   Macrobid [nitrofurantoin]  Shortness Of Breath   Codeine Other (See Comments)   Makes her disoriented   Hydrocodone Other (See Comments)   Patient states she was seeing things that weren't there and very disoriented.   Oxycodone Other (See Comments)   hallucinations   Peanut-containing Drug Products    Other reaction(s): Headache   Peanuts [peanut Oil] Other (See Comments)   Violent Migraines   Penicillins Nausea Only   Tramadol Other (See Comments)   Dizziness.        Medication List        Accurate as of June 24, 2021  1:18 PM. If you have any questions, ask your nurse or doctor.          STOP taking these medications    ciprofloxacin 500 MG tablet Commonly known as: CIPRO Stopped by: Alixandrea Milleson, PA-C   montelukast 10 MG tablet Commonly known as: SINGULAIR Stopped by: Rio Kidane, PA-C   Trospium Chloride 60 MG Cp24 Stopped by: Trevell Pariseau, PA-C       TAKE these medications    acetaminophen 500 MG tablet Commonly known as: TYLENOL Take 1,000 mg by mouth every 6 (six) hours as needed.   alendronate 70 MG tablet Commonly known as: FOSAMAX Take 70 mg by mouth every 7 (seven) days.   beta carotene w/minerals tablet Take 1 tablet by mouth daily.   bimatoprost 0.03 % ophthalmic solution Commonly known as: LUMIGAN Place 1 drop into both eyes at bedtime.   Lumigan 0.01 % Soln Generic drug: bimatoprost  dicyclomine 10 MG capsule Commonly known as: BENTYL Take 10 mg by mouth 4 (four) times daily -  before meals and at bedtime.   donepezil 10 MG disintegrating tablet Commonly known as: ARICEPT ODT Take 10 mg by mouth at bedtime. What changed: Another medication with the same name was removed. Continue taking this medication, and follow the directions you see here. Changed by: Zara Council, PA-C   DULoxetine 20 MG capsule Commonly known as: CYMBALTA Take 20 mg by mouth daily.   estradiol 0.1 MG/GM vaginal cream Commonly known as: ESTRACE APPLY ONE  PEA-SIZED AMOUNT AROUND THE OPENING OF THE URETHRA THREE TIMES WEEKLY.   gabapentin 300 MG capsule Commonly known as: NEURONTIN Take by mouth 4 (four) times daily.   latanoprost 0.005 % ophthalmic solution Commonly known as: XALATAN   levothyroxine 100 MCG tablet Commonly known as: SYNTHROID Take 100 mcg by mouth daily.   lovastatin 20 MG tablet Commonly known as: MEVACOR Take 20 mg by mouth daily.   melatonin 1 MG Tabs tablet Take 10 mg by mouth once.   nortriptyline 10 MG capsule Commonly known as: PAMELOR Take 10 mg by mouth at bedtime.   polyethylene glycol powder 17 GM/SCOOP powder Commonly known as: GLYCOLAX/MIRALAX Take by mouth.   Probiotic (Lactobacillus) Caps Take by mouth.   simvastatin 10 MG tablet Commonly known as: ZOCOR Take 10 mg by mouth at bedtime.   sulfamethoxazole-trimethoprim 800-160 MG tablet Commonly known as: BACTRIM DS Take 1 tablet by mouth every 12 (twelve) hours. Started by: Zara Council, PA-C        Allergies:  Allergies  Allergen Reactions   Macrobid [Nitrofurantoin] Shortness Of Breath   Codeine Other (See Comments)    Makes her disoriented   Hydrocodone Other (See Comments)    Patient states she was seeing things that weren't there and very disoriented.   Oxycodone Other (See Comments)    hallucinations   Peanut-Containing Drug Products     Other reaction(s): Headache   Peanuts [Peanut Oil] Other (See Comments)    Violent Migraines   Penicillins Nausea Only   Tramadol Other (See Comments)    Dizziness.    Family History: Family History  Problem Relation Age of Onset   Breast cancer Mother 30   Breast cancer Maternal Uncle 17   Breast cancer Maternal Aunt        mat great aunt   Breast cancer Maternal Aunt 87   Tracheal cancer Father    Cancer Sister    Brain cancer Sister    Cancer Brother     Social History:  reports that she has never smoked. She has never used smokeless tobacco. She reports that she  does not drink alcohol. No history on file for drug use.  ROS: Pertinent ROS in HPI  Physical Exam: BP 133/80   Pulse 80   Ht _0  (1.727 m)   Wt 163 lb (73.9 kg)   BMI 24.78 kg/m   Constitutional:  Well nourished. Alert and oriented, No acute distress. HEENT: [place] AT, mask in place.  Trachea midline Cardiovascular: No clubbing, cyanosis, or edema. Respiratory: Normal respiratory effort, no increased work of breathing. Neurologic: Grossly intact, no focal deficits, moving all 4 extremities. Psychiatric: Normal mood and affect.    Laboratory Data: Hemoglobin A1C <6.5 % 5.8   Average Blood Glucose (Calculated From HgBA1c Level) mg/dL 118   Resulting Agency  [facility]  Narrative Performed by [facility] The ADA has made  the following Recommendations:  HBA1C results between 5.7% and 6.4% are suggestive of prediabetes  HBA1C result greater than or equal to 6.5% are diagnostic of diabetes Specimen Collected: 05/15/21 09:46 Last Resulted: 05/15/21 17:46  Received From: [facility]  Result Received: 06/10/21 10:45   WBC (White Blood Cell Count) 3.2 - 9.8 x10^9/L 4.8   Hemoglobin 12.0 - 15.5 g/dL 14.5   Hematocrit 35.0 - 45.0 % 42.3   Platelets 150 - 450 x10^9/L 204   MCV (Mean Corpuscular Volume) 80 - 98 fL 91   MCH (Mean Corpuscular Hemoglobin) 26.5 - 34.0 pg 31.2   MCHC (Mean Corpuscular Hemoglobin Concentration) 31.4 - 36.0 % 34.3   RBC (Red Blood Cell Count) 3.77 - 5.16 x10^12/L 4.65   RDW-CV (Red Cell Distribution Width) 11.5 - 14.5 % 13.8   MPV (Mean Platelet Volume) 7.2 - 11.7 fL 10.0   Resulting Agency  [facility]  Specimen Collected: 05/15/21 09:46 Last Resulted: 05/15/21 15:05  Received From: [facility]  Result Received: 06/10/21 10:45   Sodium 135 - 145 mmol/L 140   Potassium 3.5 - 5.0 mmol/L 4.3   Chloride 98 - 108 mmol/L 104   Carbon Dioxide (CO2) 21 - 30 mmol/L 29    Urea Nitrogen (BUN) 7 - 20 mg/dL 16   Creatinine 0.4 - 1.0 mg/dL 1.0   Glucose 70 - 140 mg/dL 116   Comment: Interpretive Data:  Above is the NONFASTING reference range.   Below are the FASTING reference ranges:  NORMAL:      70-99 mg/dL  PREDIABETES: 100-125 mg/dL  DIABETES:    > 125 mg/dL   Calcium 8.7 - 10.2 mg/dL 9.7   AST (Aspartate Aminotransferase) 15 - 41 U/L 21   ALT (Alanine Aminotransferase) 14 - 54 U/L 18   Bilirubin, Total 0.4 - 1.5 mg/dL 1.0   Alk Phos (Alkaline Phosphatase) 24 - 110 U/L 49   Albumin 3.5 - 4.8 g/dL 4.1   Protein, Total 6.2 - 8.1 g/dL 6.5   Anion Gap 3 - 12 mmol/L 7   BUN/CREA Ratio 6 - 27 16   Glomerular Filtration Rate (eGFR)  mL/min/1.73sq m 55   Comment: CKD-EPI (2021) does not include patient's race in the calculation of eGFR. Monitoring changes of plasma creatinine and eGFR over time is useful for monitoring kidney function.  This change was made on 11/20/2020.   Interpretive Ranges for eGFR(CKD-EPI 2021):   eGFR:              > 60 mL/min/1.73 sq m - Normal  eGFR:              30 - 59 mL/min/1.73 sq m - Moderately Decreased  eGFR:              15 - 29 mL/min/1.73 sq m - Severely Decreased  eGFR:              < 15 mL/min/1.73 sq m -  Kidney Failure    Note: These eGFR calculations do not apply in acute situations  when eGFR is changing rapidly or in patients on dialysis.   Resulting Agency  [facility]  Specimen Collected: 05/15/21 09:46 Last Resulted: 05/15/21 16:43  Received From: [facility]  Result Received: 06/10/21 10:45   Urinalysis Results for orders placed or performed during the hospital encounter of 06/24/21  Urinalysis, Complete w Microscopic  Result Value Ref Range   Color, Urine YELLOW YELLOW  APPearance HAZY (A) CLEAR   Specific Gravity, Urine 1.020 1.005 - 1.030   pH 6.5 5.0 - 8.0   Glucose, UA NEGATIVE NEGATIVE mg/dL   Hgb urine dipstick NEGATIVE NEGATIVE   Bilirubin Urine NEGATIVE  NEGATIVE   Ketones, ur NEGATIVE NEGATIVE mg/dL   Protein, ur NEGATIVE NEGATIVE mg/dL   Nitrite NEGATIVE NEGATIVE   Leukocytes,Ua SMALL (A) NEGATIVE   Squamous Epithelial / LPF 0-5 0 - 5   WBC, UA >50 0 - 5 WBC/hpf   RBC / HPF 0-5 0 - 5 RBC/hpf   Bacteria, UA FEW (A) NONE SEEN   WBC Clumps PRESENT    I have reviewed the labs.   Pertinent Imaging: N/A  Assessment & Plan:    1. rUTI's - criteria for recurrent UTI has been met with 2 or more infections in 6 months or 3 or greater infections in one year  - patient is instructed to increase their water intake until the urine is pale yellow or clear (10 to 12 cups daily)  - patient is taking cranberry tablets and cranberry juice - advised to take probiotics (yogurt, oral pills or vaginal suppositories) and D-mannose - avoid soaking in tubs and wipe front to back after urinating  -UA with pyuria, bacteria and WBC clumps -Urine sent for culture -started on Septra DS mg, BID x 7 days  2. Vaginal atrophy -continue vaginal cream                                         Return for pending urine culture results .  These notes generated with voice recognition software. I apologize for typographical errors.  Zara Council, PA-C  [facility] [address]  [place], [place] [address] [phone]

## 2021-06-24 NOTE — Patient Instructions (Addendum)
D-mannose and pro-biotics

## 2021-06-26 ENCOUNTER — Other Ambulatory Visit: Payer: Self-pay | Admitting: *Deleted

## 2021-06-26 LAB — URINE CULTURE: Culture: 30000 — AB

## 2021-06-26 MED ORDER — CIPROFLOXACIN HCL 250 MG PO TABS: 250.0000 mg | ORAL_TABLET | Freq: Two times a day (BID) | ORAL | 0 refills | Status: AC

## 2021-07-01 ENCOUNTER — Ambulatory Visit
Admission: RE | Admit: 2021-07-01 | Discharge: 2021-07-01 | Disposition: A | Discharge status: Home / Self Care | Payer: Medicare Other | Source: Ambulatory Visit | Attending: Family Medicine | Admitting: Family Medicine

## 2021-07-01 ENCOUNTER — Other Ambulatory Visit: Payer: Self-pay

## 2021-07-01 DIAGNOSIS — E041 Nontoxic single thyroid nodule: Secondary | ICD-10-CM | POA: Insufficient documentation

## 2021-08-02 ENCOUNTER — Encounter (HOSPITAL_COMMUNITY): Payer: Self-pay | Admitting: Radiology

## 2021-11-19 ENCOUNTER — Ambulatory Visit: Payer: Self-pay | Admitting: Surgery

## 2021-11-19 NOTE — H&P (Signed)
Subjective: CC: Internal hemorrhoids [K64.8]   HPI:  Sarah [name] is a 88 y.o. female who returns for above. Symptoms were first noted 1 year ago. she has some rectal bleeding occurring a few times per week. Bleeding is described as slight. Pain is intermittent and discomfort, confined to the perianal area, without radiation.  Associated with nothing, exacerbated by constipation  Toilet habits: Chronic constipation with hard stools  Continues to have symptoms noted above so will like to proceed with excision.    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  Past Surgical History:  has a past surgical history that includes Breast biopsy; bladder tack; Hysterectomy; Joint replacement; Tonsillectomy; Breast biopsy; bladder tack; Appendectomy; right eyelid cancer removal; injection epidural blood patch (Bilateral, 12/29/2012); removal lumbosubarachnoid shunt system (Left, 12/29/2012); esophagogastrodoudenoscopy w/biopsy (10/17/2013); Cataract extraction; esophagogastrodoudenoscopy w/biopsy (05/23/2016); sigmoidoscopy flexible (N/A, 12/17/2016); squamous cell excision (Left, 2019); Colonoscopy; and esophagogastrodoudenoscopy w/biopsy (03/29/2019).  Family History: family history includes Aneurysm in her maternal grandfather; Brain cancer in her sister; Breast cancer in her mother; Coronary Artery Disease (Blocked arteries around heart) in her brother; Coronary Artery Disease (Blocked arteries  around heart) (age of onset: 71) in her maternal grandmother; Coronary Artery Disease (Blocked arteries around heart) (age of onset: 33) in her father; Other in her brother, father, maternal grandmother, and sister; Seizures in her daughter; Thyroid disease in her son and son; Tracheal cancer in her father.  Social History:  reports that she has never smoked. She has never used smokeless tobacco. She reports current alcohol use. She reports that she does not use drugs.  Current Medications: has a current medication list which includes the following prescription(s): acetaminophen, alendronate, beta-carotene(a)-vits c,e/mins, dicyclomine, donepezil, duloxetine, estradiol, gabapentin, lactobacillus acidophilus, levothyroxine, lovastatin, melatonin, montelukast, olopatadine, polyethylene glycol, and bimatoprost.  Allergies:  Allergies as of 11/19/2021 - Reviewed 11/19/2021 Allergen Reaction Noted  Oxycodone Hallucination   Pseudoeph-triprolidine-cod Other (See Comments)   Vitamin d [cholecalciferol (vitamin d3)] Nausea 05/15/2014  Codeine Other (See Comments)   Penicillins Nausea   Dihydrotachysterol Nausea 01/31/2015  Hydrocodone Other (See Comments)   Peanut Headache   Percocet [oxycodone-acetaminophen] Other (See Comments)   Tramadol Dizziness 10/17/2013   ROS:  A 15 point review of systems was performed and pertinent positives and negatives noted in HPI  Objective:    BP 136/86    Pulse 86    Ht 172.7 cm (5\' 8" )    Wt 73 kg (161 lb)    BMI 24.48 kg/m   Constitutional :  No distress, cooperative, alert Lymphatics/Throat:  Supple with no lymphadenopathy Respiratory:  Clear to auscultation bilaterally Cardiovascular:  Regular rate and rhythm Gastrointestinal: Soft, non-tender, non-distended, no organomegaly. Musculoskeletal: Steady gait and movement Skin: Cool and moist,  Psychiatric: Normal affect, non-agitated, not confused Rectal: deferred     LABS:    n/a  RADS: n/a Assessment:    Internal hemorrhoids [K64.8] unable to proceed with banding due to sharp pain noted last time when attempting to place band in office.  Will schedule for hemorrhoidectomy  Plan:   1. Internal hemorrhoids [K64.8]  Discussed risks/benefits/alternatives to surgery.  Alternatives include the options of observation,  medical management.  Benefits include symptomatic relief.  I discussed  in detail and the complications related to the operation and the anesthesia, including bleeding, infection, recurrence, remote possibility of temporary or permanent fecal incontinence, poor/delayed wound healing, chronic pain, and additional procedures to address said risks. The risks of general anesthetic, if used, includes MI, CVA, sudden death or even reaction to anesthetic medications also discussed.   We also discussed typical post operative recovery which includes weeks to potentially months of anal pain, drainage, occasional bleeding, and sense of fecal urgency.    ED return precautions given for sudden increase in pain, bleeding, with possible accompanying fever, nausea, and/or vomiting.  The patient understands the risks, any and all questions were answered to the patient's satisfaction.  2. Patient has elected to proceed with surgical treatment. Procedure will be scheduled.  labs/images/medications/previous chart entries reviewed personally and relevant changes/updates noted above.

## 2021-11-28 ENCOUNTER — Other Ambulatory Visit: Payer: Medicare Other

## 2021-12-05 ENCOUNTER — Ambulatory Visit: Admit: 2021-12-05 | Payer: Medicare Other | Admitting: Surgery

## 2021-12-05 SURGERY — EXAM UNDER ANESTHESIA WITH HEMORRHOIDECTOMY
Anesthesia: General | Site: Rectum

## 2021-12-12 ENCOUNTER — Encounter: Payer: Self-pay | Admitting: Student in an Organized Health Care Education/Training Program

## 2021-12-12 ENCOUNTER — Ambulatory Visit (INDEPENDENT_AMBULATORY_CARE_PROVIDER_SITE_OTHER): Payer: Medicare Other | Admitting: Urology

## 2021-12-12 ENCOUNTER — Ambulatory Visit
Payer: Medicare Other | Attending: Student in an Organized Health Care Education/Training Program | Admitting: Student in an Organized Health Care Education/Training Program

## 2021-12-12 ENCOUNTER — Other Ambulatory Visit: Payer: Self-pay

## 2021-12-12 ENCOUNTER — Other Ambulatory Visit: Payer: Self-pay | Admitting: Urology

## 2021-12-12 ENCOUNTER — Telehealth: Payer: Self-pay | Admitting: Urology

## 2021-12-12 VITALS — BP 126/88 | HR 86 | Temp 97.9°F | Resp 16 | Ht 68.0 in | Wt 160.0 lb

## 2021-12-12 VITALS — BP 146/80 | HR 79 | Ht 68.0 in | Wt 160.0 lb

## 2021-12-12 DIAGNOSIS — G894 Chronic pain syndrome: Secondary | ICD-10-CM | POA: Diagnosis not present

## 2021-12-12 DIAGNOSIS — M5126 Other intervertebral disc displacement, lumbar region: Secondary | ICD-10-CM | POA: Insufficient documentation

## 2021-12-12 DIAGNOSIS — N390 Urinary tract infection, site not specified: Secondary | ICD-10-CM

## 2021-12-12 DIAGNOSIS — M5416 Radiculopathy, lumbar region: Secondary | ICD-10-CM | POA: Diagnosis present

## 2021-12-12 LAB — URINALYSIS, COMPLETE
Bilirubin, UA: NEGATIVE
Glucose, UA: NEGATIVE
Ketones, UA: NEGATIVE
Nitrite, UA: NEGATIVE
Protein,UA: NEGATIVE
Specific Gravity, UA: 1.015 (ref 1.005–1.030)
Urobilinogen, Ur: 0.2 mg/dL (ref 0.2–1.0)
pH, UA: 7 (ref 5.0–7.5)

## 2021-12-12 LAB — MICROSCOPIC EXAMINATION
Bacteria, UA: NONE SEEN
WBC, UA: >30 /hpf — AB (ref 0–5)

## 2021-12-12 MED ORDER — CEFUROXIME AXETIL 500 MG PO TABS: 500.0000 mg | ORAL_TABLET | Freq: Two times a day (BID) | ORAL | 0 refills | Status: DC

## 2021-12-12 NOTE — Progress Notes (Signed)
12/19/21 ?8:02 AM  ? ?Sarah [name] ?1931/10/01 ?[id number] ? ?Referring provider:  ?Sharyne Peach, MD ?[facility] ?[place],   [id number] ? ?Chief Complaint  ?Patient presents with  ? Recurrent UTI  ? ? ?Urological history: ?1. OAB ?-Contributing factors of age, vaginal atrophy, hypertension, pelvic surgery, IBS, depression, memory issues, sleep apnea and stenosis of lumbosacral spine ?- pelvic sling > 20 years ago ?  ?2. Vaginal atrophy ?- could not tolerate vaginal estrogen cream ?  ?3. High risk hematuria ?-non-smoker ?-CTU 09/2020 NED ?-cysto 09/2020 NED ?-no reports of gross heme ?-UA negative for micro heme ?  ?4. rUTI's ?-contributing factors of age, vaginal atrophy, loose stools and constipation ?-documented positive urine cultures over the last year ?            Proteus mirabilis resistant to Nitrofurantoin and Trimeth/Sulfa  06/24/2021 ?            E.coli resistant to ampicillin, gentamicin, tetracycline and     trimethoprim/sulfa on 12/11/2020 ?-managed with cranberry tablets and vaginal estrogen cream ? ?HPI: ?Sarah [name] is a 88 y.o.female who presents today for further evaluation of UTI symptoms. ? ?She reports today that she has frequency, urgency burning, she has been taking cranberry supplements.  ? ?Patient denies any modifying or aggravating factors.  Patient denies any gross hematuria, dysuria or suprapubic/flank pain.  Patient denies any fevers, chills, nausea or vomiting.  ? ?UA > 30 WBC's  ? ?PMH: ?Past Medical History:  ?Diagnosis Date  ? Benign essential HTN 09/21/2015  ? Cancer [facility])   ? Skin CA resected from Right eyelid and both legs.  ? Seizures ([place])   ? Thyroid disease   ? ? ?Surgical History: ?Past Surgical History:  ?Procedure Laterality Date  ? ABDOMINAL HYSTERECTOMY    ? BREAST BIOPSY Bilateral   ? neg  ? ? ?Home Medications:  ?Allergies as of 12/12/2021   ? ?   Reactions  ? Macrobid [nitrofurantoin] Shortness Of Breath  ? Codeine Other (See Comments)   ? Makes her disoriented  ? Hydrocodone Other (See Comments)  ? Patient states she was seeing things that weren't there and very disoriented.  ? Oxycodone Other (See Comments)  ? hallucinations  ? Peanut-containing Drug Products   ? Other reaction(s): Headache  ? Peanuts [peanut Oil] Other (See Comments)  ? Violent Migraines  ? Penicillins Nausea Only  ? Tramadol Other (See Comments)  ? Dizziness.  ? ?  ? ?  ?Medication List  ?  ? ?  ? Accurate as of December 12, 2021 11:59 PM. If you have any questions, ask your nurse or doctor.  ?  ?  ? ?  ? ?acetaminophen 500 MG tablet ?Commonly known as: TYLENOL ?Take 1,000 mg by mouth every 6 (six) hours as needed. ?  ?alendronate 70 MG tablet ?Commonly known as: FOSAMAX ?Take 70 mg by mouth every 7 (seven) days. ?  ?beta carotene w/minerals tablet ?Take 1 tablet by mouth daily. ?  ?bimatoprost 0.03 % ophthalmic solution ?Commonly known as: LUMIGAN ?Place 1 drop into both eyes at bedtime. ?  ?Lumigan 0.01 % Soln ?Generic drug: bimatoprost ?Place 1 drop into both eyes at bedtime. ?  ?cefUROXime 500 MG tablet ?Commonly known as: CEFTIN ?Take 1 tablet (500 mg total) by mouth 2 (two) times daily with a meal. ?Started by: Zara Council, PA-C ?  ?dicyclomine 10 MG capsule ?Commonly known as: BENTYL ?Take 10 mg by mouth 4 (four) times daily -  before meals and at bedtime. ?  ?donepezil 10 MG disintegrating tablet ?Commonly known as: ARICEPT ODT ?Take 10 mg by mouth at bedtime. ?  ?DULoxetine HCl 40 MG Cpep ?Take 40 mg by mouth daily. ?What changed: Another medication with the same name was removed. Continue taking this medication, and follow the directions you see here. ?Changed by: Zara Council, PA-C ?  ?estradiol 0.1 MG/GM vaginal cream ?Commonly known as: ESTRACE ?APPLY ONE PEA-SIZED AMOUNT AROUND THE OPENING OF THE URETHRA THREE TIMES WEEKLY. ?  ?gabapentin 300 MG capsule ?Commonly known as: NEURONTIN ?Take 300 mg by mouth 3 (three) times daily. 300 mg tid and 600 mg qhs ?   ?latanoprost 0.005 % ophthalmic solution ?Commonly known as: XALATAN ?  ?levothyroxine 100 MCG tablet ?Commonly known as: SYNTHROID ?Take 100 mcg by mouth daily. ?  ?lovastatin 20 MG tablet ?Commonly known as: MEVACOR ?Take 20 mg by mouth daily. ?  ?melatonin 1 MG Tabs tablet ?Take 10 mg by mouth once. ?  ?montelukast 10 MG tablet ?Commonly known as: SINGULAIR ?Take 10 mg by mouth daily. ?  ?nortriptyline 10 MG capsule ?Commonly known as: PAMELOR ?Take 10 mg by mouth at bedtime. ?  ?polyethylene glycol powder 17 GM/SCOOP powder ?Commonly known as: GLYCOLAX/MIRALAX ?Take by mouth. ?  ?Probiotic (Lactobacillus) Caps ?Take by mouth. ?  ?simvastatin 10 MG tablet ?Commonly known as: ZOCOR ?Take 10 mg by mouth at bedtime. ?  ? ?  ? ? ?Allergies:  ?Allergies  ?Allergen Reactions  ? Macrobid [Nitrofurantoin] Shortness Of Breath  ? Codeine Other (See Comments)  ?  Makes her disoriented  ? Hydrocodone Other (See Comments)  ?  Patient states she was seeing things that weren't there and very disoriented.  ? Oxycodone Other (See Comments)  ?  hallucinations  ? Peanut-Containing Drug Products   ?  Other reaction(s): Headache  ? Peanuts [Peanut Oil] Other (See Comments)  ?  Violent Migraines  ? Penicillins Nausea Only  ? Tramadol Other (See Comments)  ?  Dizziness.  ? ? ?Family History: ?Family History  ?Problem Relation Age of Onset  ? Breast cancer Mother 31  ? Breast cancer Maternal Uncle 60  ? Breast cancer Maternal Aunt   ?     mat great aunt  ? Breast cancer Maternal Aunt 70  ? Tracheal cancer Father   ? Cancer Sister   ? Brain cancer Sister   ? Cancer Brother   ? ? ?Social History:  reports that she has never smoked. She has never used smokeless tobacco. She reports that she does not drink alcohol. No history on file for drug use. ? ? ?Physical Exam: ?BP (!) 146/80   Pulse 79   Ht '5\' 8"'$  (1.727 m)   Wt 160 lb (72.6 kg)   BMI 24.33 kg/m?   ?Constitutional:  Well nourished. Alert and oriented, No acute distress. ?HEENT:  [place] AT, mask in place.  Trachea midline ?Cardiovascular: No clubbing, cyanosis, or edema. ?Respiratory: Normal respiratory effort, no increased work of breathing. ?Neurologic: Grossly intact, no focal deficits, moving all 4 extremities. ?Psychiatric: Normal mood and affect.   ? ?Laboratory Data: ?Thyroid Stimulating Hormone (TSH) 0.34 - 5.66 ?IU/mL 1.41   ?[facility]  ?Specimen Collected: 11/15/21 08:33 Last Resulted: 11/15/21 16:46  ?Received From: [facility]  Result Received: 12/03/21 11:36  ? ?Urinalysis ?Component ?    Latest Ref Rng 12/12/2021  ?Glucose, UA ?    Negative  Negative   ?  Specific Gravity, UA ?    1.005 - 1.030  1.015   ?pH, UA ?    5.0 - 7.5  7.0   ?Color, UA ?    Yellow  Yellow   ?Appearance Ur ?    Clear  Cloudy !   ?Leukocytes,UA ?    Negative  1+ !   ?Protein,UA ?    Negative/Trace  Negative   ?Ketones, UA ?    Negative  Negative   ?RBC, UA ?    Negative  Trace !   ?Bilirubin, UA ?    Negative  Negative   ?Urobilinogen, Ur ?    0.2 - 1.0 mg/dL 0.2   ?Nitrite, UA ?    Negative  Negative   ?Microscopic Examination See below:   ?  ?! Abnormal ? ?Component ?    Latest Ref Rng 12/12/2021  ?WBC, UA ?    0 - 5 /hpf >30 !   ?Bacteria, UA ?    None seen/Few  None seen   ?RBC ?    0 - 2 /hpf 0-2   ?Epithelial Cells (non renal) ?    0 - 10 /hpf 0-10   ?  ?! Abnormal ?I have reviewed the labs.  ? ?Assessment & Plan:   ? ?Recurrent UTIs ?-UA with pyuria ?-Urine sent for culture ?-Started on Ceftin 500 mg twice daily empirically will adjust once urine culture is available ? ?Return for pending urine culture results. ? ?[facility] ?[address], [address] ?[place], [place] [address] ?[phone] ? ?I,Anelia Carriveau,acting as a scribe for [employer], PA-C.,have documented all relevant documentation on the behalf of [place], PA-C,as directed by  [facility], PA-C while in the presence of [place],  PA-C. ? ?I have reviewed the above documentation for accuracy and completeness, and I agree with the above.   ? ?Zara Council, PA-C  ?

## 2021-12-12 NOTE — Telephone Encounter (Signed)
Patient called the office today requesting a same day appointment with Zara Council, PA-C for possible UTI. ? ?Patient has an appt on campus at 2:00pm at the pain clinic and is trying to coordinate a visit with our office.  ? ?Patient reports burning during and after urination, pressure and pain in her abdomen, no fever to report. Symptoms started 2 days ago and getting worse. ?

## 2021-12-12 NOTE — Progress Notes (Signed)
Safety precautions to be maintained throughout the outpatient stay will include: orient to surroundings, keep bed in low position, maintain call bell within reach at all times, provide assistance with transfer out of bed and ambulation.  

## 2021-12-12 NOTE — Patient Instructions (Signed)
Pain Management Discharge Instructions  General Discharge Instructions :  If you need to reach your doctor call: Monday-Friday 8:00 am - 4:00 pm at [phone] or toll free [phone].  After clinic hours [phone] to have operator reach doctor.  Bring all of your medication bottles to all your appointments in the pain clinic.  To cancel or reschedule your appointment with Pain Management please remember to call 24 hours in advance to avoid a fee.  Refer to the educational materials which you have been given on: General Risks, I had my Procedure. Discharge Instructions, Post Sedation.  Post Procedure Instructions:  The drugs you were given will stay in your system until tomorrow, so for the next 24 hours you should not drive, make any legal decisions or drink any alcoholic beverages.  You may eat anything you prefer, but it is better to start with liquids then soups and crackers, and gradually work up to solid foods.  Please notify your doctor immediately if you have any unusual bleeding, trouble breathing or pain that is not related to your normal pain.  Depending on the type of procedure that was done, some parts of your body may feel week and/or numb.  This usually clears up by tonight or the next day.  Walk with the use of an assistive device or accompanied by an adult for the 24 hours.  You may use ice on the affected area for the first 24 hours.  Put ice in a Ziploc bag and cover with a towel and place against area 15 minutes on 15 minutes off.  You may switch to heat after 24 hours.Epidural Steroid Injection Patient Information  Description: The epidural space surrounds the nerves as they exit the spinal cord.  In some patients, the nerves can be compressed and inflamed by a bulging disc or a tight spinal canal (spinal stenosis).  By injecting steroids into the epidural space, we can bring irritated nerves into direct contact with a potentially helpful medication.  These  steroids act directly on the irritated nerves and can reduce swelling and inflammation which often leads to decreased pain.  Epidural steroids may be injected anywhere along the spine and from the neck to the low back depending upon the location of your pain.   After numbing the skin with local anesthetic (like Novocaine), a small needle is passed into the epidural space slowly.  You may experience a sensation of pressure while this is being done.  The entire block usually last less than 10 minutes.  Conditions which may be treated by epidural steroids:  Low back and leg pain Neck and arm pain Spinal stenosis Post-laminectomy syndrome Herpes zoster (shingles) pain Pain from compression fractures  Preparation for the injection:  Do not eat any solid food or dairy products within 8 hours of your appointment.  You may drink clear liquids up to 3 hours before appointment.  Clear liquids include water, black coffee, juice or soda.  No milk or cream please. You may take your regular medication, including pain medications, with a sip of water before your appointment  Diabetics should hold regular insulin (if taken separately) and take 1/2 normal NPH dos the morning of the procedure.  Carry some sugar containing items with you to your appointment. A driver must accompany you and be prepared to drive you home after your procedure.  Bring all your current medications with your. An IV may be inserted and sedation may be given at the discretion of the physician.     A blood pressure cuff, EKG and other monitors will often be applied during the procedure.  Some patients may need to have extra oxygen administered for a short period. You will be asked to provide medical information, including your allergies, prior to the procedure.  We must know immediately if you are taking blood thinners (like Coumadin/Warfarin)  Or if you are allergic to IV iodine contrast (dye). We must know if you could possible be  pregnant.  Possible side-effects: Bleeding from needle site Infection (rare, may require surgery) Nerve injury (rare) Numbness & tingling (temporary) Difficulty urinating (rare, temporary) Spinal headache ( a headache worse with upright posture) Light -headedness (temporary) Pain at injection site (several days) Decreased blood pressure (temporary) Weakness in arm/leg (temporary) Pressure sensation in back/neck (temporary)  Call if you experience: Fever/chills associated with headache or increased back/neck pain. Headache worsened by an upright position. New onset weakness or numbness of an extremity below the injection site Hives or difficulty breathing (go to the emergency room) Inflammation or drainage at the infection site Severe back/neck pain Any new symptoms which are concerning to you  Please note:  Although the local anesthetic injected can often make your back or neck feel good for several hours after the injection, the pain will likely return.  It takes 3-7 days for steroids to work in the epidural space.  You may not notice any pain relief for at least that one week.  If effective, we will often do a series of three injections spaced 3-6 weeks apart to maximally decrease your pain.  After the initial series, we generally will wait several months before considering a repeat injection of the same type.  If you have any questions, please call [phone] [facility] 

## 2021-12-12 NOTE — Progress Notes (Signed)
PROVIDER NOTE: Information contained herein reflects review and annotations entered in association with encounter. Interpretation of such information and data should be left to medically-trained personnel. Information provided to patient can be located elsewhere in the medical record under "Patient Instructions". Document created using STT-dictation technology, any transcriptional errors that may result from process are unintentional.  ?  ?Patient: Sarah [name]  Service Category: E/M  Provider: Gillis Santa, MD  ?DOB: 1931-11-13  DOS: 12/12/2021  Specialty: Interventional Pain Management  ?MRN: [id number]  Setting: Ambulatory outpatient  PCP: Sarah Peach, MD  ?Type: Established Patient    Referring Provider: Sharyne Peach, MD  ?Location: Office  Delivery: Face-to-face    ? ?HPI  ?[name], a 88 y.o. year old female, is here today because of her Lumbar radiculopathy [M54.16]. [name] primary complain today is Back Pain (Lumbar bilateral ) and Hip Pain (Left ) ?Last encounter: My last encounter with her was on 03/20/21 ?Pertinent problems: [name] has Lumbar facet arthropathy; Lumbar radiculopathy; Lumbar disc herniation (L2/L3); Lumbar degenerative disc disease; Chronic pain syndrome; Neuropathy; Thyroid nodule; and Bilateral occipital neuralgia on their pertinent problem list. ?Pain Assessment: Severity of Chronic pain is reported as a 6 /10. Location: Back (left hip) Lower, Left, Right/denies. Onset: More than a month ago. Quality: Discomfort, Constant, Aching. Timing: Constant. Modifying factor(s): rest. ?Vitals:  height is 5' 8"  (1.727 m) and weight is 160 lb (72.6 kg). Her temporal temperature is 97.9 ?F (36.6 ?C). Her blood pressure is 126/88 and her pulse is 86. Her respiration is 16 and oxygen saturation is 99%.  ? ?Reason for encounter: evaluation of worsening, or previously known (established) problem.   ? ?Patient presents today with low back pain with radiation  into her left hip and bilateral legs in a dermatomal fashion.  She states that she is due for a epidural injection.  This was previously done by me on 03/07/2019 which was a midline L2-L3 ESI that provided her with approximately 60 to 75% pain relief for over 6 months and now she is having increased pain in that area.  We discussed repeating lumbar epidural steroid injection which will be her first 1 for this year.  Risk and benefits reviewed and patient would like to proceed. ? ? ? ?ROS  ?Constitutional: Denies any fever or chills ?Gastrointestinal: No reported hemesis, hematochezia, vomiting, or acute GI distress ?Musculoskeletal:  Low back pain with radiation into bilateral legs ?Neurological: No reported episodes of acute onset apraxia, aphasia, dysarthria, agnosia, amnesia, paralysis, loss of coordination, or loss of consciousness ? ?Medication Review  ?DULoxetine HCl, Probiotic (Lactobacillus), acetaminophen, alendronate, beta carotene w/minerals, bimatoprost, cefUROXime, dicyclomine, donepezil, estradiol, gabapentin, latanoprost, levothyroxine, lovastatin, melatonin, montelukast, nortriptyline, polyethylene glycol powder, and simvastatin ? ?History Review  ?Allergy: [name] is allergic to macrobid [nitrofurantoin], codeine, hydrocodone, oxycodone, peanut-containing drug products, peanuts [peanut oil], penicillins, and tramadol. ?Drug: [name]  has no history on file for drug use. ?Alcohol:  reports no history of alcohol use. ?Tobacco:  reports that she has never smoked. She has never used smokeless tobacco. ?Social: [name]  reports that she has never smoked. She has never used smokeless tobacco. She reports that she does not drink alcohol. ?Medical:  has a past medical history of Benign essential HTN (09/21/2015), Cancer ([place]), Seizures ([place]), and Thyroid disease. ?Surgical: [name]  has a past surgical history that includes Abdominal hysterectomy and Breast biopsy (Bilateral). ?Family: family  history includes Brain cancer in her sister; Breast  cancer in her maternal aunt; Breast cancer (age of onset: 15) in her maternal aunt; Breast cancer (age of onset: 42) in her mother; Breast cancer (age of onset: 36) in her maternal uncle; Cancer in her brother and sister; Tracheal cancer in her father. ? ?Laboratory Chemistry Profile  ? ?Renal ?Lab Results  ?Component Value Date  ? BUN 23 (H) 09/14/2015  ? CREATININE 0.80 09/12/2020  ? GFRAA 77 09/12/2020  ? GFRNONAA 67 09/12/2020  ?  Hepatic ?Lab Results  ?Component Value Date  ? AST 19 11/26/2015  ? ALT 15 11/26/2015  ? ALBUMIN 4.7 11/26/2015  ? ALKPHOS 64 11/26/2015  ?  ?Electrolytes ?Lab Results  ?Component Value Date  ? NA 141 09/14/2015  ? K 3.5 09/14/2015  ? CL 107 09/14/2015  ? CALCIUM 9.7 09/14/2015  ? MG 2.2 09/14/2015  ?  Bone ?No results found for: [place], [id number], [id number], [id number], 25OHVITD1, 25OHVITD2, 25OHVITD3, TESTOFREE, TESTOSTERONE  ?Inflammation (CRP: Acute Phase) (ESR: Chronic Phase) ?No results found for: CRP, ESRSEDRATE, LATICACIDVEN    ?  ? ?Note: Above Lab results reviewed. ? ?Recent Imaging Review  ?US THYROID ?CLINICAL DATA:  88 year old female with thyroid nodules ? ?EXAM: ?THYROID ULTRASOUND ? ?TECHNIQUE: ?Ultrasound examination of the thyroid gland and adjacent soft ?tissues was performed. ? ?COMPARISON:  06/01/2020, 05/11/2018 ? ?FINDINGS: ?Parenchymal Echotexture: Mildly heterogenous ? ?Isthmus: 0.3 cm ? ?Right lobe: 2.5 cm x 1.0 cm x 0.6 cm ? ?Left lobe: 2.4 cm x 0.7 cm x 0.6 cm ? ?_________________________________________________________ ? ?Estimated total number of nodules >/= 1 cm: 2 ? ?Number of spongiform nodules >/=  2 cm not described below (TR1): 0 ? ?Number of mixed cystic and solid nodules >/= 1.5 cm not described ?below ([place]): 0 ? ?_________________________________________________________ ? ?Unchanged nodule labeled 1 in the isthmus, TR 3. Nodule does not ?meet criteria for further surveillance. ? ?Nodule labeled  2 in the right inferior thyroid. Measures slightly ?smaller larger 1.3 cm, previously 1.1 cm. Given the more irregular ?lobulated deep border, nodule meets criteria for biopsy. ? ?No adenopathy ? ?IMPRESSION: ?Multinodular thyroid again demonstrated. ? ?Interval increased irregularity along the deep border of the right ?thyroid nodule. Referral for biopsy is recommended at this time. ? ?Recommendations follow those established by the new ACR TI-RADS ?criteria ([place] 7096;28:366-294). ? ?Electronically Signed ?  By: Corrie Mckusick D.O. ?  On: 07/02/2021 09:12 ?Note: Reviewed       ? ?Physical Exam  ?General appearance: Well nourished, well developed, and well hydrated. In no apparent acute distress ?Mental status: Alert, oriented x 3 (person, place, & time)       ?Respiratory: No evidence of acute respiratory distress ?Eyes: PERLA ?Vitals: BP 126/88 (BP Location: Right Arm, Patient Position: Sitting, Cuff Size: Normal)   Pulse 86   Temp 97.9 ?F (36.6 ?C) (Temporal)   Resp 16   Ht 5' 8"  (1.727 m)   Wt 160 lb (72.6 kg)   SpO2 99%   BMI 24.33 kg/m?  ?BMI: Estimated body mass index is 24.33 kg/m? as calculated from the following: ?  Height as of this encounter: 5' 8"  (1.727 m). ?  Weight as of this encounter: 160 lb (72.6 kg). ?Ideal: Ideal body weight: 63.9 kg (140 lb 14 oz) ?Adjusted ideal body weight: 67.4 kg (148 lb 8.4 oz) ? ?  ?Lumbar Spine Area Exam  ?Skin & Axial Inspection: No masses, redness, or swelling ?Alignment: Symmetrical ?Functional ROM: Pain restricted ROM       ?  Stability: No instability detected ?Muscle Tone/Strength: Functionally intact. No obvious neuro-muscular anomalies detected. ?Sensory (Neurological): Dermatomal pain pattern ?Palpation: No palpable anomalies       ?Provocative Tests: ?Hyperextension/rotation test: (+) due to pain. ?Lumbar quadrant test (Kemp's test): (+) on the left for foraminal stenosis ?Lateral bending test: deferred today       ?Gait & Posture Assessment   ?Ambulation: Unassisted ?Gait: Relatively normal for age and body habitus ?Posture: Difficulty standing up straight, due to pain  ?Lower Extremity Exam      ?Side: Right lower extremity   Side: Left lower extremi

## 2021-12-16 LAB — CULTURE, URINE COMPREHENSIVE

## 2021-12-19 ENCOUNTER — Encounter: Payer: Self-pay | Admitting: Urology

## 2021-12-25 ENCOUNTER — Ambulatory Visit
Admission: RE | Admit: 2021-12-25 | Discharge: 2021-12-25 | Disposition: A | Discharge status: Home / Self Care | Payer: Medicare Other | Source: Ambulatory Visit | Attending: Student in an Organized Health Care Education/Training Program | Admitting: Student in an Organized Health Care Education/Training Program

## 2021-12-25 ENCOUNTER — Encounter: Payer: Self-pay | Admitting: Student in an Organized Health Care Education/Training Program

## 2021-12-25 ENCOUNTER — Ambulatory Visit (HOSPITAL_BASED_OUTPATIENT_CLINIC_OR_DEPARTMENT_OTHER): Payer: Medicare Other | Admitting: Student in an Organized Health Care Education/Training Program

## 2021-12-25 VITALS — BP 150/81 | HR 75 | Temp 97.6°F | Resp 18 | Ht 68.0 in | Wt 160.0 lb

## 2021-12-25 DIAGNOSIS — M5126 Other intervertebral disc displacement, lumbar region: Secondary | ICD-10-CM | POA: Insufficient documentation

## 2021-12-25 DIAGNOSIS — G894 Chronic pain syndrome: Secondary | ICD-10-CM | POA: Diagnosis not present

## 2021-12-25 DIAGNOSIS — M5416 Radiculopathy, lumbar region: Secondary | ICD-10-CM

## 2021-12-25 MED ORDER — SODIUM CHLORIDE 0.9% FLUSH
2.0000 mL | Freq: Once | INTRAVENOUS | Status: AC
  Administered 2021-12-25: 2 mL

## 2021-12-25 MED ORDER — SODIUM CHLORIDE (PF) 0.9 % IJ SOLN
INTRAMUSCULAR | Status: AC
  Filled 2021-12-25: qty 10

## 2021-12-25 MED ORDER — LIDOCAINE HCL 2 % IJ SOLN
INTRAMUSCULAR | Status: AC
  Filled 2021-12-25: qty 20

## 2021-12-25 MED ORDER — ROPIVACAINE HCL 2 MG/ML IJ SOLN
INTRAMUSCULAR | Status: AC
  Filled 2021-12-25: qty 20

## 2021-12-25 MED ORDER — ROPIVACAINE HCL 2 MG/ML IJ SOLN
2.0000 mL | Freq: Once | INTRAMUSCULAR | Status: AC
  Administered 2021-12-25: 2 mL via EPIDURAL

## 2021-12-25 MED ORDER — IOHEXOL 180 MG/ML  SOLN
10.0000 mL | Freq: Once | INTRAMUSCULAR | Status: AC
  Administered 2021-12-25: 5 mL via EPIDURAL

## 2021-12-25 MED ORDER — DEXAMETHASONE SODIUM PHOSPHATE 10 MG/ML IJ SOLN
INTRAMUSCULAR | Status: AC
  Filled 2021-12-25: qty 1

## 2021-12-25 MED ORDER — DEXAMETHASONE SODIUM PHOSPHATE 10 MG/ML IJ SOLN
10.0000 mg | Freq: Once | INTRAMUSCULAR | Status: AC
  Administered 2021-12-25: 10 mg

## 2021-12-25 MED ORDER — LIDOCAINE HCL 2 % IJ SOLN
20.0000 mL | Freq: Once | INTRAMUSCULAR | Status: AC
  Administered 2021-12-25: 400 mg

## 2021-12-25 NOTE — Progress Notes (Signed)
Safety precautions to be maintained throughout the outpatient stay will include: orient to surroundings, keep bed in low position, maintain call bell within reach at all times, provide assistance with transfer out of bed and ambulation.  

## 2021-12-25 NOTE — Progress Notes (Signed)
Patient's Name: Nigel Wessman  MRN: [id number]  ?Referring Provider: Sharyne Peach, MD  DOB: 05/20/1932  ?PCP: Sharyne Peach, MD  DOS: 12/25/2021  ?Note by: Gillis Santa, MD  Service setting: Ambulatory outpatient  ?Specialty: Interventional Pain Management  Patient type: Established  ?Location: [facility] (AMB) Pain Management Facility  Visit type: Interventional Procedure  ? ?Primary Reason for Visit: Interventional Pain Management Treatment. ?CC: Back Pain (lower) ? ? ?Procedure:          Anesthesia, Analgesia, Anxiolysis:  ?Type: Therapeutic Inter-Laminar Epidural Steroid Injection  #1 in 2023 ?Region: Lumbar ?Level: L2-3 Level. ?Laterality: Midline         Type: Local Anesthesia ?Indication(s): Analgesia         ?Route: Infiltration ([place]ace/IM) ?IV Access: Declined ?Sedation: Declined  ?Local Anesthetic: Lidocaine 1-2% ? ?Position: Prone with head of the table was raised to facilitate breathing.  ? ?Indications: ?1. Lumbar radiculopathy   ?2. Lumbar disc herniation (L2/L3)   ?3. Chronic pain syndrome   ? ? ?Pain Score: ?Pre-procedure: 6 /10 ?Post-procedure: 0-No pain/10 ? ?Pre-op Assessment:  ?Ms. Mumford is a 88 y.o. (year old), female patient, seen today for interventional treatment. She  has a past surgical history that includes Abdominal hysterectomy and Breast biopsy (Bilateral). Ms. Pouliot has a current medication list which includes the following prescription(s): acetaminophen, alendronate, beta carotene w/minerals, cefuroxime, dicyclomine, donepezil, duloxetine hcl, estradiol, gabapentin, latanoprost, levothyroxine, lovastatin, lumigan, melatonin, montelukast, nortriptyline, polyethylene glycol powder, probiotic (lactobacillus), simvastatin, and bimatoprost. Her primarily concern today is the Back Pain (lower) ? ? ?Initial Vital Signs:  ?Pulse/HCG Rate: 75ECG Heart Rate: 80 ?Temp: 97.6 ?F (36.4 ?C) ?Resp: 16 ?BP: 133/89 ?SpO2: 100 % ? ?BMI: Estimated body mass index is 24.33 kg/m? as calculated from  the following: ?  Height as of this encounter: '5\' 8"'$  (1.727 m). ?  Weight as of this encounter: 160 lb (72.6 kg). ? ?Risk Assessment: ?Allergies: Reviewed. She is allergic to macrobid [nitrofurantoin], codeine, hydrocodone, oxycodone, peanut-containing drug products, peanuts [peanut oil], penicillins, and tramadol.  ?Allergy Precautions: None required ?Coagulopathies: Reviewed. None identified.  ?Blood-thinner therapy: None at this time ?Active Infection(s): Reviewed. None identified. Ms. Pujol is afebrile ? ?Site Confirmation: Ms. Braggs was asked to confirm the procedure and laterality before marking the site ?Procedure checklist: Completed ?Consent: Before the procedure and under the influence of no sedative(s), amnesic(s), or anxiolytics, the patient was informed of the treatment options, risks and possible complications. To fulfill our ethical and legal obligations, as recommended by the American Medical Association's Code of Ethics, I have informed the patient of my clinical impression; the nature and purpose of the treatment or procedure; the risks, benefits, and possible complications of the intervention; the alternatives, including doing nothing; the risk(s) and benefit(s) of the alternative treatment(s) or procedure(s); and the risk(s) and benefit(s) of doing nothing. ?The patient was provided information about the general risks and possible complications associated with the procedure. These may include, but are not limited to: failure to achieve desired goals, infection, bleeding, organ or nerve damage, allergic reactions, paralysis, and death. ?In addition, the patient was informed of those risks and complications associated to Spine-related procedures, such as failure to decrease pain; infection (i.e.: Meningitis, epidural or intraspinal abscess); bleeding (i.e.: epidural hematoma, subarachnoid hemorrhage, or any other type of intraspinal or peri-dural bleeding); organ or nerve damage (i.e.: Any type  of peripheral nerve, nerve root, or spinal cord injury) with subsequent damage to sensory, motor, and/or autonomic systems, resulting in permanent  pain, numbness, and/or weakness of one or several areas of the body; allergic reactions; (i.e.: anaphylactic reaction); and/or death. ?Furthermore, the patient was informed of those risks and complications associated with the medications. These include, but are not limited to: allergic reactions (i.e.: anaphylactic or anaphylactoid reaction(s)); adrenal axis suppression; blood sugar elevation that in diabetics may result in ketoacidosis or comma; water retention that in patients with history of congestive heart failure may result in shortness of breath, pulmonary edema, and decompensation with resultant heart failure; weight gain; swelling or edema; medication-induced neural toxicity; particulate matter embolism and blood vessel occlusion with resultant organ, and/or nervous system infarction; and/or aseptic necrosis of one or more joints. ?Finally, the patient was informed that Medicine is not an exact science; therefore, there is also the possibility of unforeseen or unpredictable risks and/or possible complications that may result in a catastrophic outcome. The patient indicated having understood very clearly. We have given the patient no guarantees and we have made no promises. Enough time was given to the patient to ask questions, all of which were answered to the patient's satisfaction. Ms. Shoemaker has indicated that she wanted to continue with the procedure. ?Attestation: I, the ordering provider, attest that I have discussed with the patient the benefits, risks, side-effects, alternatives, likelihood of achieving goals, and potential problems during recovery for the procedure that I have provided informed consent. ?Date  Time: 12/25/2021 11:31 AM ? ?Pre-Procedure Preparation:  ?Monitoring: As per clinic protocol. Respiration, ETCO2, SpO2, BP, heart rate and rhythm  monitor placed and checked for adequate function ?Safety Precautions: Patient was assessed for positional comfort and pressure points before starting the procedure. ?Time-out: I initiated and conducted the "Time-out" before starting the procedure, as per protocol. The patient was asked to participate by confirming the accuracy of the "Time Out" information. Verification of the correct person, site, and procedure were performed and confirmed by me, the nursing staff, and the patient. "Time-out" conducted as per Joint Commission's Universal Protocol (UP.01.01.01). ?Time: 1200 ? ?Description of Procedure:          ?Target Area: The interlaminar space, initially targeting the lower laminar border of the superior vertebral body. ?Approach: Paramedial approach. ?Area Prepped: Entire Posterior Lumbar Region ?Prepping solution: ChloraPrep (2% chlorhexidine gluconate and 70% isopropyl alcohol) ?Safety Precautions: Aspiration looking for blood return was conducted prior to all injections. At no point did we inject any substances, as a needle was being advanced. No attempts were made at seeking any paresthesias. Safe injection practices and needle disposal techniques used. Medications properly checked for expiration dates. SDV (single dose vial) medications used. ?Description of the Procedure: Protocol guidelines were followed. The procedure needle was introduced through the skin, ipsilateral to the reported pain, and advanced to the target area. Bone was contacted and the needle walked caudad, until the lamina was cleared. The epidural space was identified using ?loss-of-resistance technique? with 2-3 ml of PF-NaCl (0.9% NSS), in a 5cc LOR glass syringe. ? ?Vitals:  ? 12/25/21 1135 12/25/21 1157 12/25/21 1201 12/25/21 1207  ?BP: 133/89 114/60 137/81 (!) 150/81  ?Pulse: 75     ?Resp: '16 13 15 18  '$ ?Temp: 97.6 ?F (36.4 ?C)     ?SpO2: 100% 98% 99% 97%  ?Weight: 160 lb (72.6 kg)     ?Height: '5\' 8"'$  (1.727 m)     ? ? ?Start Time:  1200 hrs. ?End Time: 1205 hrs. ? ?Materials:  ?Needle(s) Type: Epidural needle ?Gauge: 22G ?Length: 3.5-in ?Medication(s): Please see orders for medications  and dosing details. ?5CC solution made of 2cc of

## 2021-12-25 NOTE — Patient Instructions (Signed)

## 2021-12-26 ENCOUNTER — Telehealth: Payer: Self-pay

## 2022-01-22 ENCOUNTER — Encounter: Payer: Self-pay | Admitting: Student in an Organized Health Care Education/Training Program

## 2022-01-22 ENCOUNTER — Ambulatory Visit
Payer: Medicare Other | Attending: Student in an Organized Health Care Education/Training Program | Admitting: Student in an Organized Health Care Education/Training Program

## 2022-01-22 DIAGNOSIS — M47816 Spondylosis without myelopathy or radiculopathy, lumbar region: Secondary | ICD-10-CM

## 2022-01-22 DIAGNOSIS — M25552 Pain in left hip: Secondary | ICD-10-CM

## 2022-01-22 DIAGNOSIS — G894 Chronic pain syndrome: Secondary | ICD-10-CM

## 2022-01-22 DIAGNOSIS — M5416 Radiculopathy, lumbar region: Secondary | ICD-10-CM | POA: Diagnosis not present

## 2022-01-22 DIAGNOSIS — M5126 Other intervertebral disc displacement, lumbar region: Secondary | ICD-10-CM

## 2022-01-22 DIAGNOSIS — M25551 Pain in right hip: Secondary | ICD-10-CM

## 2022-01-22 DIAGNOSIS — M160 Bilateral primary osteoarthritis of hip: Secondary | ICD-10-CM

## 2022-01-22 NOTE — Progress Notes (Signed)
Patient: Sarah [name]  Service Category: E/M  Provider: Gillis Santa, MD  ?DOB: 20-Jan-1932  DOS: 01/22/2022  Location: Office  ?MRN: [id number]  Setting: Ambulatory outpatient  Referring Provider: Sharyne Peach, MD  ?Type: Established Patient  Specialty: Interventional Pain Management  PCP: Sharyne Peach, MD  ?Location: Remote location  Delivery: TeleHealth    ? ?Virtual Encounter - Pain Management ?PROVIDER NOTE: Information contained herein reflects review and annotations entered in association with encounter. Interpretation of such information and data should be left to medically-trained personnel. Information provided to patient can be located elsewhere in the medical record under "Patient Instructions". Document created using STT-dictation technology, any transcriptional errors that may result from process are unintentional.  ?  ?Contact & Pharmacy ?Preferred: [phone] ?Home: [phone] (home) ?Mobile: [phone] (mobile) ?E-mail: [email or web address]  ?[id number] [place] [address]?Phone: [phone]: [phone]? ?[facility] [place]?[address]?Phone: [phone]: [phone]?  ?Pre-screening  ?Ms. GGwilliamoffered "in-person" vs "virtual" encounter. She indicated preferring virtual for this encounter.  ? ?Reason ?COVID-19*  Social distancing based on CDC and AMA recommendations.  ? ?I contacted Sarah Bundeon 01/22/2022 via telephone.      I clearly identified myself as BGillis Santa MD. I verified that I was speaking with the correct person using two identifiers (Name: Sarah [name] and date of birth: 1Jun 04, 1933. ? ?Consent ?I sought verbal advanced consent from Sarah [name] virtual visit interactions. I informed Sarah [name] possible security and privacy concerns, risks, and limitations associated  with providing "not-in-person" medical evaluation and management services. I also informed Sarah [name] the availability of "in-person" appointments. Finally, I informed her that there would be a charge for the virtual visit and that she could be  personally, fully or partially, financially responsible for it. Ms. GSzymborskiexpressed understanding and agreed to proceed.  ? ?Historic Elements   ?[name] a 88y.o. year old, female patient evaluated today after our last contact on 12/25/2021. Sarah [name] has a past medical history of Benign essential HTN (09/21/2015), Cancer ([place], [place]ures [place], and Thyroid disease. She also  has a past surgical history that includes Abdominal hysterectomy and Breast biopsy (Bilateral). Ms. GPuringtonhas a current medication list which includes the following prescription(s): acetaminophen, beta carotene w/minerals, dicyclomine, donepezil, duloxetine hcl, estradiol, fexofenadine, gabapentin, latanoprost, levothyroxine, lovastatin, melatonin, nortriptyline, omeprazole, polyethylene glycol powder, simvastatin, alendronate, bimatoprost, cefuroxime, lumigan, montelukast, and probiotic (lactobacillus). She  reports that she has never smoked. She has never used smokeless tobacco. She reports that she does not drink alcohol. No history on file for drug use. Sarah [name] allergic to macrobid [nitrofurantoin], codeine, hydrocodone, oxycodone, peanut-containing drug products, peanuts [peanut oil], penicillins, and tramadol.  ? ?HPI  ?Today, she is being contacted for a post-procedure assessment. ? ? ?Post-procedure evaluation  ? ?Type: Therapeutic Inter-Laminar Epidural Steroid Injection  #1 in 2023 ?Region: Lumbar ?Level: L2-3 Level. ?Laterality: Midline        ? ?Effectiveness:  ?Initial hour after procedure: 100 %  ?Subsequent 4-6 hours post-procedure: 100 %  ?Analgesia past initial 6 hours: 100 % (numbness for the first day and the pain returned the next day.)  ?Ongoing  improvement:  ?Analgesic:  20% ?Function: Sarah [name] improvement in function ?ROM: [name] improvement in  ROM ? ? ?Laboratory Chemistry Profile  ? ?Renal ?Lab Results  ?Component Value Date  ? BUN 23 (H) 09/14/2015  ? CREATININE 0.80 09/12/2020  ? GFRAA 77 09/12/2020  ? GFRNONAA 67 09/12/2020  ?  Hepatic ?Lab Results  ?Component Value Date  ? AST 19 11/26/2015  ? ALT 15 11/26/2015  ? ALBUMIN 4.7 11/26/2015  ? ALKPHOS 64 11/26/2015  ?  ?Electrolytes ?Lab Results  ?Component Value Date  ? NA 141 09/14/2015  ? K 3.5 09/14/2015  ? CL 107 09/14/2015  ? CALCIUM 9.7 09/14/2015  ? MG 2.2 09/14/2015  ?  Bone ?No results found for: [place], [id number], [id number], [id number], 25OHVITD1, 25OHVITD2, 25OHVITD3, TESTOFREE, TESTOSTERONE  ?Inflammation (CRP: Acute Phase) (ESR: Chronic Phase) ?No results found for: CRP, ESRSEDRATE, LATICACIDVEN    ?  ? ?Note: Above Lab results reviewed. ? ?Assessment  ?The primary encounter diagnosis was Bilateral primary osteoarthritis of hip. Diagnoses of Bilateral hip pain, Lumbar radiculopathy, Lumbar disc herniation (L2/L3), Lumbar facet arthropathy (L5/S1), and Chronic pain syndrome were also pertinent to this visit. ? ?Plan of Care  ?Limited response with previous lumbar epidural steroid injection, she states that the radiating pain into her legs is better.  She is endorsing increased bilateral hip pain.  This is worse with weightbearing.  Recommend diagnostic bilateral hip intra-articular steroid injection.  Risk and benefits reviewed and patient like to proceed.  We will plan on doing this in mid June.  She is currently out of town visiting her brother. ? ? ?Orders:  ?Orders Placed This Encounter  ?Procedures  ? HIP INJECTION  ?  Standing Status:   Future  ?  Standing Expiration Date:   04/24/2022  ?  Scheduling Instructions:  ?   Side: Bilateral  ?   Sedation: without  ?   Timeframe: Mid June  ? ?Follow-up plan:   ?Return in about 7 weeks (around 03/12/2022) for B/L hip IA  steroid , in clinic NS.   ? ?Recent Visits ?Date Type Provider Dept  ?12/25/21 Procedure visit Gillis Santa, MD [facility]  ?12/12/21 Office Visit Gillis Santa, MD [facility]  ?Showing recent visits within past 90 days and meeting all other requirements ?Today's Visits ?Date Type Provider Dept  ?01/22/22 Office Visit Gillis Santa, MD [facility]  ?Showing today's visits and meeting all other requirements ?Future Appointments ?No visits were found meeting these conditions. ?Showing future appointments within next 90 days and meeting all other requirements ? ?I discussed the assessment and treatment plan with the patient. The patient was provided an opportunity to ask questions and all were answered. The patient agreed with the plan and demonstrated an understanding of the instructions. ? ?Patient advised to call back or seek an in-person evaluation if the symptoms or condition worsens. ? ?Duration of encounter: 11mnutes. ? ?Note by: BGillis Santa MD ?Date: 01/22/2022; Time: 2:29 PM ?

## 2022-02-10 ENCOUNTER — Other Ambulatory Visit: Payer: Self-pay | Admitting: Family Medicine

## 2022-02-10 DIAGNOSIS — Z1231 Encounter for screening mammogram for malignant neoplasm of breast: Secondary | ICD-10-CM

## 2022-03-12 ENCOUNTER — Encounter: Payer: Self-pay | Admitting: Student in an Organized Health Care Education/Training Program

## 2022-03-12 ENCOUNTER — Ambulatory Visit
Payer: Medicare Other | Attending: Student in an Organized Health Care Education/Training Program | Admitting: Student in an Organized Health Care Education/Training Program

## 2022-03-12 ENCOUNTER — Ambulatory Visit
Admission: RE | Admit: 2022-03-12 | Discharge: 2022-03-12 | Disposition: A | Discharge status: Home / Self Care | Payer: Medicare Other | Source: Ambulatory Visit | Attending: Student in an Organized Health Care Education/Training Program | Admitting: Student in an Organized Health Care Education/Training Program

## 2022-03-12 VITALS — BP 145/81 | HR 69 | Temp 97.2°F | Resp 12 | Ht 68.0 in | Wt 164.0 lb

## 2022-03-12 DIAGNOSIS — M25552 Pain in left hip: Secondary | ICD-10-CM

## 2022-03-12 DIAGNOSIS — M25551 Pain in right hip: Secondary | ICD-10-CM | POA: Diagnosis not present

## 2022-03-12 DIAGNOSIS — M160 Bilateral primary osteoarthritis of hip: Secondary | ICD-10-CM | POA: Diagnosis not present

## 2022-03-12 MED ORDER — METHYLPREDNISOLONE ACETATE 40 MG/ML IJ SUSP
INTRAMUSCULAR | Status: AC
  Filled 2022-03-12: qty 1

## 2022-03-12 MED ORDER — DEXAMETHASONE SODIUM PHOSPHATE 10 MG/ML IJ SOLN
INTRAMUSCULAR | Status: AC
  Filled 2022-03-12: qty 1

## 2022-03-12 MED ORDER — IOHEXOL 180 MG/ML  SOLN: 10.0000 mL | Freq: Once | INTRAMUSCULAR | Status: DC

## 2022-03-12 MED ORDER — IOHEXOL 180 MG/ML  SOLN
INTRAMUSCULAR | Status: AC
  Filled 2022-03-12: qty 20

## 2022-03-12 MED ORDER — METHYLPREDNISOLONE ACETATE 80 MG/ML IJ SUSP: 80.0000 mg | Freq: Once | INTRAMUSCULAR | Status: DC

## 2022-03-12 MED ORDER — ROPIVACAINE HCL 2 MG/ML IJ SOLN: 18.0000 mL | Freq: Once | INTRAMUSCULAR | Status: DC

## 2022-03-12 MED ORDER — LIDOCAINE HCL 2 % IJ SOLN: 20.0000 mL | Freq: Once | INTRAMUSCULAR | Status: DC

## 2022-03-12 MED ORDER — LIDOCAINE HCL 2 % IJ SOLN
INTRAMUSCULAR | Status: AC
  Filled 2022-03-12: qty 20

## 2022-03-12 MED ORDER — ROPIVACAINE HCL 2 MG/ML IJ SOLN
INTRAMUSCULAR | Status: AC
  Filled 2022-03-12: qty 20

## 2022-03-12 NOTE — Progress Notes (Signed)
Safety precautions to be maintained throughout the outpatient stay will include: orient to surroundings, keep bed in low position, maintain call bell within reach at all times, provide assistance with transfer out of bed and ambulation.  

## 2022-03-12 NOTE — Progress Notes (Signed)
PROVIDER NOTE: Interpretation of information contained herein should be left to medically-trained personnel. Specific patient instructions are provided elsewhere under "Patient Instructions" section of medical record. This document was created in part using STT-dictation technology, any transcriptional errors that may result from this process are unintentional.  Patient: Sarah [name] Type: Established DOB: 1932-04-30 MRN: [id number] PCP: Sharyne Peach, MD  Service: Procedure DOS: 03/12/2022 Setting: Ambulatory Location: Ambulatory outpatient facility Delivery: Face-to-face Provider: Gillis Santa, MD Specialty: Interventional Pain Management Specialty designation: 09 Location: Outpatient facility Ref. Prov.: Sharyne Peach, MD    Primary Reason for Visit: Interventional Pain Management Treatment. CC: Hip Pain (both)    Procedure:          Anesthesia, Analgesia, Anxiolysis:  Type: Intra-Articular Hip Injection #1  Primary Purpose: Diagnostic/Therapeutric Region: Anterolateral hip joint area. Level: Lower pelvic and hip joint level. Approach: Anterior approach. Laterality: Bilateral  Anesthesia: Local (1-2% Lidocaine)  Anxiolysis: None  Sedation: None  Guidance: Fluoroscopy           Position: Supine Prepped Area: Entire Anterolateral hip area. DuraPrep (Iodine Povacrylex [0.7% available iodine] and Isopropyl Alcohol, 74% w/w)   1. Bilateral primary osteoarthritis of hip   2. Bilateral hip pain    NAS-11 Pain score:   Pre-procedure: 4 /10   Post-procedure: 0-No pain/10     Pre-op H&P Assessment:  Sarah [name] is a 88 y.o. (year old), female patient, seen today for interventional treatment. She  has a past surgical history that includes Abdominal hysterectomy and Breast biopsy (Bilateral). Sarah [name] has a current medication list which includes the following prescription(s): acetaminophen, beta carotene w/minerals, dicyclomine, donepezil, duloxetine hcl, estradiol,  fexofenadine, gabapentin, latanoprost, levothyroxine, lovastatin, melatonin, nortriptyline, omeprazole, polyethylene glycol powder, simvastatin, alendronate, bimatoprost, cefuroxime, lumigan, montelukast, and probiotic (lactobacillus), and the following Facility-Administered Medications: iohexol, lidocaine, methylprednisolone acetate, and ropivacaine (pf) 2 mg/ml (0.2%). Her primarily concern today is the Hip Pain (both)  Initial Vital Signs:  Pulse/HCG Rate: 69ECG Heart Rate: 65 Temp: (!) 97.2 F (36.2 C) Resp: (!) 9 BP: 132/89 SpO2: 99 %  BMI: Estimated body mass index is 24.94 kg/m as calculated from the following:   Height as of this encounter: '5\' 8"'$  (1.727 m).   Weight as of this encounter: 164 lb (74.4 kg).  Risk Assessment: Allergies: Reviewed. She is allergic to macrobid [nitrofurantoin], codeine, hydrocodone, oxycodone, peanut-containing drug products, peanuts [peanut oil], penicillins, and tramadol.  Allergy Precautions: None required Coagulopathies: Reviewed. None identified.  Blood-thinner therapy: None at this time Active Infection(s): Reviewed. None identified. Sarah [name] is afebrile  Site Confirmation: Sarah [name] was asked to confirm the procedure and laterality before marking the site Procedure checklist: Completed Consent: Before the procedure and under the influence of no sedative(s), amnesic(s), or anxiolytics, the patient was informed of the treatment options, risks and possible complications. To fulfill our ethical and legal obligations, as recommended by the American Medical Association's Code of Ethics, I have informed the patient of my clinical impression; the nature and purpose of the treatment or procedure; the risks, benefits, and possible complications of the intervention; the alternatives, including doing nothing; the risk(s) and benefit(s) of the alternative treatment(s) or procedure(s); and the risk(s) and benefit(s) of doing nothing. The patient was provided  information about the general risks and possible complications associated with the procedure. These may include, but are not limited to: failure to achieve desired goals, infection, bleeding, organ or nerve damage, allergic reactions, paralysis, and death. In addition, the patient was informed of  those risks and complications associated to the procedure, such as failure to decrease pain; infection; bleeding; organ or nerve damage with subsequent damage to sensory, motor, and/or autonomic systems, resulting in permanent pain, numbness, and/or weakness of one or several areas of the body; allergic reactions; (i.e.: anaphylactic reaction); and/or death. Furthermore, the patient was informed of those risks and complications associated with the medications. These include, but are not limited to: allergic reactions (i.e.: anaphylactic or anaphylactoid reaction(s)); adrenal axis suppression; blood sugar elevation that in diabetics may result in ketoacidosis or comma; water retention that in patients with history of congestive heart failure may result in shortness of breath, pulmonary edema, and decompensation with resultant heart failure; weight gain; swelling or edema; medication-induced neural toxicity; particulate matter embolism and blood vessel occlusion with resultant organ, and/or nervous system infarction; and/or aseptic necrosis of one or more joints. Finally, the patient was informed that Medicine is not an exact science; therefore, there is also the possibility of unforeseen or unpredictable risks and/or possible complications that may result in a catastrophic outcome. The patient indicated having understood very clearly. We have given the patient no guarantees and we have made no promises. Enough time was given to the patient to ask questions, all of which were answered to the patient's satisfaction. Sarah [name] has indicated that she wanted to continue with the procedure. Attestation: I, the ordering  provider, attest that I have discussed with the patient the benefits, risks, side-effects, alternatives, likelihood of achieving goals, and potential problems during recovery for the procedure that I have provided informed consent. Date  Time: 03/12/2022  8:52 AM  Pre-Procedure Preparation:  Monitoring: As per clinic protocol. Respiration, ETCO2, SpO2, BP, heart rate and rhythm monitor placed and checked for adequate function Safety Precautions: Patient was assessed for positional comfort and pressure points before starting the procedure. Time-out: I initiated and conducted the "Time-out" before starting the procedure, as per protocol. The patient was asked to participate by confirming the accuracy of the "Time Out" information. Verification of the correct person, site, and procedure were performed and confirmed by me, the nursing staff, and the patient. "Time-out" conducted as per Joint Commission's Universal Protocol (UP.01.01.01). Time: 1001  Description of Procedure:          Safety Precautions: Aspiration looking for blood return was conducted prior to all injections. At no point did we inject any substances, as a needle was being advanced. No attempts were made at seeking any paresthesias. Safe injection practices and needle disposal techniques used. Medications properly checked for expiration dates. SDV (single dose vial) medications used. Description of the Procedure: Protocol guidelines were followed. The patient was placed in position over the fluoroscopy table. The target area was identified and the area prepped in the usual manner. Skin & deeper tissues infiltrated with local anesthetic. Appropriate amount of time allowed to pass for local anesthetics to take effect. The procedure needles were then advanced to the target area. Proper needle placement secured. Negative aspiration confirmed. Solution injected in intermittent fashion, asking for systemic symptoms every 0.5cc of injectate. The  needles were then removed and the area cleansed, making sure to leave some of the prepping solution back to take advantage of its long term bactericidal properties. Vitals:   03/12/22 0856 03/12/22 1000 03/12/22 1008  BP: 132/89 (!) 151/84 (!) 145/81  Pulse: 69    Resp:  (!) 9 12  Temp: (!) 97.2 F (36.2 C)    SpO2: 99% 99% 99%  Weight: 164 lb (  74.4 kg)    Height: '5\' 8"'$  (1.727 m)      Start Time: 1001 hrs. End Time: 1005 hrs. Materials:  Needle(s) Type: Spinal Needle Gauge: 22G Length: 3.5-in Medication(s): Please see orders for medications and dosing details.  10 cc solution made of 9 cc of 0.2% ropivacaine, 1 cc of methylprednisolone, 80 mg/cc.  5 cc injected into each hip after contrast confirmation.  40 mg of methylprednisolone injected into each hip.  Imaging Guidance (Non-Spinal):          Type of Imaging Technique: Fluoroscopy Guidance (Non-Spinal) Indication(s): Assistance in needle guidance and placement for procedures requiring needle placement in or near specific anatomical locations not easily accessible without such assistance. Exposure Time: Please see nurses notes. Contrast: Before injecting any contrast, we confirmed that the patient did not have an allergy to iodine, shellfish, or radiological contrast. Once satisfactory needle placement was completed at the desired level, radiological contrast was injected. Contrast injected under live fluoroscopy. No contrast complications. See chart for type and volume of contrast used. Fluoroscopic Guidance: I was personally present during the use of fluoroscopy. "Tunnel Vision Technique" used to obtain the best possible view of the target area. Parallax error corrected before commencing the procedure. "Direction-depth-direction" technique used to introduce the needle under continuous pulsed fluoroscopy. Once target was reached, antero-posterior, oblique, and lateral fluoroscopic projection used confirm needle placement in all planes.  Images permanently stored in EMR. Interpretation: I personally interpreted the imaging intraoperatively. Adequate needle placement confirmed in multiple planes. Appropriate spread of contrast into desired area was observed. No evidence of afferent or efferent intravascular uptake. Permanent images saved into the patient's record.  Antibiotic Prophylaxis:   Anti-infectives (From admission, onward)    None      Indication(s): None identified  Post-operative Assessment:  Post-procedure Vital Signs:  Pulse/HCG Rate: 6964 Temp:  (!) 97.2 F (36.2 C) Resp: 12 BP:  (!) 145/81 SpO2: 99 %  EBL: None  Complications: No immediate post-treatment complications observed by team, or reported by patient.  Note: The patient tolerated the entire procedure well. A repeat set of vitals were taken after the procedure and the patient was kept under observation following institutional policy, for this type of procedure. Post-procedural neurological assessment was performed, showing return to baseline, prior to discharge. The patient was provided with post-procedure discharge instructions, including a section on how to identify potential problems. Should any problems arise concerning this procedure, the patient was given instructions to immediately contact us, at any time, without hesitation. In any case, we plan to contact the patient by telephone for a follow-up status report regarding this interventional procedure.  Comments:  No additional relevant information.  Plan of Care  Orders:  Orders Placed This Encounter  Procedures   DG PAIN CLINIC C-ARM 1-60 MIN NO REPORT    Intraoperative interpretation by procedural physician at [place].    Standing Status:   Standing    Number of Occurrences:   1    Order Specific Question:   Reason for exam:    Answer:   Assistance in needle guidance and placement for procedures requiring needle placement in or near specific anatomical locations not  easily accessible without such assistance.    Medications ordered for procedure: Meds ordered this encounter  Medications   iohexol (OMNIPAQUE) 180 MG/ML injection 10 mL    Must be Myelogram-compatible. If not available, you may substitute with a water-soluble, non-ionic, hypoallergenic, myelogram-compatible radiological contrast medium.   lidocaine (XYLOCAINE) 2 % (  with pres) injection 400 mg   ropivacaine (PF) 2 mg/mL (0.2%) (NAROPIN) injection 18 mL   methylPREDNISolone acetate (DEPO-MEDROL) injection 80 mg   Medications administered: Sidney Ace had no medications administered during this visit.  See the medical record for exact dosing, route, and time of administration.  Follow-up plan:   Return in about 6 weeks (around 04/23/2022) for Post Procedure Evaluation, virtual.     Recent Visits Date Type Provider Dept  01/22/22 Office Visit Gillis Santa, MD [facility]  12/25/21 Procedure visit Gillis Santa, MD [facility]  12/12/21 Office Visit Gillis Santa, MD [facility]  Showing recent visits within past 90 days and meeting all other requirements Today's Visits Date Type Provider Dept  03/12/22 Procedure visit Gillis Santa, MD [facility]  Showing today's visits and meeting all other requirements Future Appointments Date Type Provider Dept  04/24/22 Appointment Gillis Santa, MD [facility]  Showing future appointments within next 90 days and meeting all other requirements  Disposition: Discharge home  Discharge (Date  Time): 03/12/2022; 1010 hrs.   Primary Care Physician: Sharyne Peach, MD Location: [facility] Note by: Gillis Santa, MD Date: 03/12/2022; Time: 10:38 AM  Disclaimer:  Medicine is not an exact science. The only guarantee in medicine is that nothing is guaranteed. It is important to note that the decision to proceed with this intervention was based on the information  collected from the patient. The Data and conclusions were drawn from the patient's questionnaire, the interview, and the physical examination. Because the information was provided in large part by the patient, it cannot be guaranteed that it has not been purposely or unconsciously manipulated. Every effort has been made to obtain as much relevant data as possible for this evaluation. It is important to note that the conclusions that lead to this procedure are derived in large part from the available data. Always take into account that the treatment will also be dependent on availability of resources and existing treatment guidelines, considered by other Pain Management Practitioners as being common knowledge and practice, at the time of the intervention. For Medico-Legal purposes, it is also important to point out that variation in procedural techniques and pharmacological choices are the acceptable norm. The indications, contraindications, technique, and results of the above procedure should only be interpreted and judged by a Board-Certified Interventional Pain Specialist with extensive familiarity and expertise in the same exact procedure and technique.

## 2022-03-12 NOTE — Patient Instructions (Signed)

## 2022-03-13 ENCOUNTER — Telehealth: Payer: Self-pay

## 2022-03-13 NOTE — Telephone Encounter (Signed)
Post procedure phone call.  Patient states she is doing fine.  

## 2022-03-17 ENCOUNTER — Ambulatory Visit
Admission: RE | Admit: 2022-03-17 | Discharge: 2022-03-17 | Disposition: A | Discharge status: Home / Self Care | Payer: Medicare Other | Source: Ambulatory Visit | Attending: Family Medicine | Admitting: Family Medicine

## 2022-03-17 DIAGNOSIS — Z1231 Encounter for screening mammogram for malignant neoplasm of breast: Secondary | ICD-10-CM | POA: Insufficient documentation

## 2022-03-19 ENCOUNTER — Other Ambulatory Visit: Payer: Self-pay | Admitting: Family Medicine

## 2022-03-19 DIAGNOSIS — R928 Other abnormal and inconclusive findings on diagnostic imaging of breast: Secondary | ICD-10-CM

## 2022-03-19 DIAGNOSIS — N6489 Other specified disorders of breast: Secondary | ICD-10-CM

## 2022-04-02 ENCOUNTER — Ambulatory Visit (INDEPENDENT_AMBULATORY_CARE_PROVIDER_SITE_OTHER): Payer: Medicare Other | Admitting: Physician Assistant

## 2022-04-02 VITALS — BP 115/69 | HR 86 | Ht 68.0 in | Wt 162.0 lb

## 2022-04-02 DIAGNOSIS — N390 Urinary tract infection, site not specified: Secondary | ICD-10-CM

## 2022-04-02 MED ORDER — CEFUROXIME AXETIL 250 MG PO TABS: 250.0000 mg | ORAL_TABLET | Freq: Two times a day (BID) | ORAL | 0 refills | Status: AC

## 2022-04-03 LAB — URINALYSIS, COMPLETE
Bilirubin, UA: NEGATIVE
Glucose, UA: NEGATIVE
Ketones, UA: NEGATIVE
Nitrite, UA: NEGATIVE
Protein,UA: NEGATIVE
Specific Gravity, UA: 1.015 (ref 1.005–1.030)
Urobilinogen, Ur: 0.2 mg/dL (ref 0.2–1.0)
pH, UA: 7.5 (ref 5.0–7.5)

## 2022-04-03 LAB — MICROSCOPIC EXAMINATION: Bacteria, UA: NONE SEEN

## 2022-04-03 NOTE — Progress Notes (Signed)
04/02/2022 4:36 PM   [name] [birth year] [id number]  CC: Chief Complaint  Patient presents with   Recurrent UTI   HPI: Sarah [name] is a 88 y.o. female with PMH OAB, recurrent UTI on cranberry supplements, and vaginal atrophy on vaginal estrogen cream who presents today for evaluation of possible UTI.   Today she reports onset of lower abdominal pain, upper body chills with urination, and dysuria this morning.  She denies fever, nausea, vomiting, and gross hematuria.  In-office UA today positive for trace intact blood and trace leukocyte esterase; urine microscopy with 11-30 WBCs/HPF.  PMH: Past Medical History:  Diagnosis Date   Benign essential HTN 09/21/2015   Cancer ([place])    Skin CA resected from Right eyelid and both legs.   Seizures ([place])    Thyroid disease     Surgical History: Past Surgical History:  Procedure Laterality Date   ABDOMINAL HYSTERECTOMY     BREAST BIOPSY Bilateral    neg   BREAST EXCISIONAL BIOPSY Right     Home Medications:  Allergies as of 04/02/2022       Reactions   Macrobid [nitrofurantoin] Shortness Of Breath   Codeine Other (See Comments)   Makes her disoriented   Hydrocodone Other (See Comments)   Patient states she was seeing things that weren't there and very disoriented.   Oxycodone Other (See Comments)   hallucinations   Peanut-containing Drug Products    Other reaction(s): Headache   Peanuts [peanut Oil] Other (See Comments)   Violent Migraines   Penicillins Nausea Only   Tramadol Other (See Comments)   Dizziness.        Medication List        Accurate as of April 02, 2022 11:59 PM. If you have any questions, ask your nurse or doctor.          acetaminophen 500 MG tablet Commonly known as: TYLENOL Take 1,000 mg by mouth every 6 (six) hours as needed.   alendronate 70 MG tablet Commonly known as: FOSAMAX Take 70 mg by mouth every 7 (seven) days.   beta carotene w/minerals  tablet Take 1 tablet by mouth daily.   bimatoprost 0.03 % ophthalmic solution Commonly known as: LUMIGAN Place 1 drop into both eyes at bedtime.   Lumigan 0.01 % Soln Generic drug: bimatoprost Place 1 drop into both eyes at bedtime.   cefUROXime 250 MG tablet Commonly known as: CEFTIN Take 1 tablet (250 mg total) by mouth 2 (two) times daily with a meal for 7 days. What changed:  medication strength how much to take   dicyclomine 10 MG capsule Commonly known as: BENTYL Take 10 mg by mouth 4 (four) times daily -  before meals and at bedtime.   donepezil 10 MG disintegrating tablet Commonly known as: ARICEPT ODT Take 10 mg by mouth at bedtime.   DULoxetine HCl 40 MG Cpep Take 40 mg by mouth daily.   estradiol 0.1 MG/GM vaginal cream Commonly known as: ESTRACE APPLY ONE PEA-SIZED AMOUNT AROUND THE OPENING OF THE URETHRA THREE TIMES WEEKLY.   fexofenadine 180 MG tablet Commonly known as: ALLEGRA Take 180 mg by mouth daily.   gabapentin 300 MG capsule Commonly known as: NEURONTIN Take 300 mg by mouth 3 (three) times daily. 300 mg tid and 600 mg qhs   latanoprost 0.005 % ophthalmic solution Commonly known as: XALATAN   levothyroxine 100 MCG tablet Commonly known as: SYNTHROID Take 100 mcg by mouth daily.   lovastatin 20  MG tablet Commonly known as: MEVACOR Take 20 mg by mouth daily.   melatonin 1 MG Tabs tablet Take 10 mg by mouth once.   montelukast 10 MG tablet Commonly known as: SINGULAIR Take 10 mg by mouth daily.   nortriptyline 10 MG capsule Commonly known as: PAMELOR Take 10 mg by mouth at bedtime.   omeprazole 20 MG capsule Commonly known as: PRILOSEC Take 20 mg by mouth daily.   polyethylene glycol powder 17 GM/SCOOP powder Commonly known as: GLYCOLAX/MIRALAX Take by mouth.   Probiotic (Lactobacillus) Caps Take by mouth.   simvastatin 10 MG tablet Commonly known as: ZOCOR Take 10 mg by mouth at bedtime.        Allergies:  Allergies   Allergen Reactions   Macrobid [Nitrofurantoin] Shortness Of Breath   Codeine Other (See Comments)    Makes her disoriented   Hydrocodone Other (See Comments)    Patient states she was seeing things that weren't there and very disoriented.   Oxycodone Other (See Comments)    hallucinations   Peanut-Containing Drug Products     Other reaction(s): Headache   Peanuts [Peanut Oil] Other (See Comments)    Violent Migraines   Penicillins Nausea Only   Tramadol Other (See Comments)    Dizziness.    Family History: Family History  Problem Relation Age of Onset   Breast cancer Mother 35   Breast cancer Maternal Uncle 61   Breast cancer Maternal Aunt        mat great aunt   Breast cancer Maternal Aunt 12   Tracheal cancer Father    Cancer Sister    Brain cancer Sister    Cancer Brother     Social History:   reports that she has never smoked. She has never used smokeless tobacco. She reports that she does not drink alcohol. No history on file for drug use.  Physical Exam: BP 115/69   Pulse 86   Ht '5\' 8"'$  (1.727 m)   Wt 162 lb (73.5 kg)   BMI 24.63 kg/m   Constitutional:  Alert and oriented, no acute distress, nontoxic appearing HEENT: [place], AT Cardiovascular: No clubbing, cyanosis, or edema Respiratory: Normal respiratory effort, no increased work of breathing Skin: No rashes, bruises or suspicious lesions Neurologic: Grossly intact, no focal deficits, moving all 4 extremities Psychiatric: Normal mood and affect  Laboratory Data: Results for orders placed or performed in visit on 04/02/22  Microscopic Examination   Urine  Result Value Ref Range   WBC, UA 11-30 (A) 0 - 5 /hpf   RBC, Urine 0-2 0 - 2 /hpf   Epithelial Cells (non renal) 0-10 0 - 10 /hpf   Bacteria, UA None seen None seen/Few  Urinalysis, Complete  Result Value Ref Range   Specific Gravity, UA 1.015 1.005 - 1.030   pH, UA 7.5 5.0 - 7.5   Color, UA Yellow Yellow   Appearance Ur Hazy (A) Clear    Leukocytes,UA Trace (A) Negative   Protein,UA Negative Negative/Trace   Glucose, UA Negative Negative   Ketones, UA Negative Negative   RBC, UA Trace (A) Negative   Bilirubin, UA Negative Negative   Urobilinogen, Ur 0.2 0.2 - 1.0 mg/dL   Nitrite, UA Negative Negative   Microscopic Examination See below:    Assessment & Plan:   1. Recurrent UTI UA today notable for pyuria consistent with past UTIs.  Will start empiric cefuroxime and send for culture for further evaluation. - Urinalysis, Complete - CULTURE, URINE COMPREHENSIVE -  cefUROXime (CEFTIN) 250 MG tablet; Take 1 tablet (250 mg total) by mouth 2 (two) times daily with a meal for 7 days.  Dispense: 14 tablet; Refill: 0  Return if symptoms worsen or fail to improve.  Debroah Loop, PA-C  [facility] [address], [place], [place] [address] [phone]

## 2022-04-05 LAB — CULTURE, URINE COMPREHENSIVE

## 2022-04-08 ENCOUNTER — Ambulatory Visit
Admission: RE | Admit: 2022-04-08 | Discharge: 2022-04-08 | Disposition: A | Discharge status: Home / Self Care | Payer: Medicare Other | Source: Ambulatory Visit | Attending: Family Medicine | Admitting: Family Medicine

## 2022-04-08 DIAGNOSIS — N6489 Other specified disorders of breast: Secondary | ICD-10-CM | POA: Diagnosis present

## 2022-04-08 DIAGNOSIS — R928 Other abnormal and inconclusive findings on diagnostic imaging of breast: Secondary | ICD-10-CM | POA: Insufficient documentation

## 2022-04-23 ENCOUNTER — Ambulatory Visit
Admission: EM | Admit: 2022-04-23 | Discharge: 2022-04-23 | Disposition: A | Discharge status: Home / Self Care | Payer: Medicare Other

## 2022-04-23 ENCOUNTER — Ambulatory Visit (INDEPENDENT_AMBULATORY_CARE_PROVIDER_SITE_OTHER): Payer: Medicare Other

## 2022-04-23 ENCOUNTER — Encounter: Payer: Self-pay | Admitting: Emergency Medicine

## 2022-04-23 ENCOUNTER — Telehealth: Payer: Self-pay

## 2022-04-23 DIAGNOSIS — R0789 Other chest pain: Secondary | ICD-10-CM | POA: Diagnosis not present

## 2022-04-23 DIAGNOSIS — R079 Chest pain, unspecified: Secondary | ICD-10-CM

## 2022-04-23 DIAGNOSIS — R0602 Shortness of breath: Secondary | ICD-10-CM

## 2022-04-23 NOTE — ED Provider Notes (Signed)
[facility]    CSN: [id number] Arrival date & time: 04/23/22  1410      History   Chief Complaint Chief Complaint  Patient presents with   Chest Pain   Shortness of Breath    HPI Sarah [name] is a 88 y.o. female.   HPI  88 year old female here for evaluation of chest pain.  Patient reports that she has been experiencing pain in the right upper chest since yesterday.  The pain is constant and does not radiate.  It is causing her to have shortness of breath and it does increase with movement.  She denies any cough.  This morning she tripped and landed on her right shoulder and right hip as well on a carpeted floor.  She is not complaining of any pain in her right shoulder but she does have some pain in her right flank.  She is walking without difficulty.  She is able to speak in full sentences without difficulty as well.  Past Medical History:  Diagnosis Date   Benign essential HTN 09/21/2015   Cancer ([place])    Skin CA resected from Right eyelid and both legs.   Seizures ([place])    Thyroid disease     Patient Active Problem List   Diagnosis Date Noted   Bilateral primary osteoarthritis of hip 01/22/2022   Bilateral occipital neuralgia 09/24/2020   Cervical facet joint syndrome 03/05/2020   Cervical spondylosis 03/01/2020   SI joint arthritis 06/23/2019   Chronic SI joint pain 06/23/2019   Carpal tunnel syndrome 06/07/2019   Low back pain 06/07/2019   Major depressive disorder with single episode, in full remission ([place]) 06/07/2019   Osteoarthritis of knee 06/07/2019   Pain in limb 06/07/2019   SOBOE (shortness of breath on exertion) 10/14/2018   Lumbar radiculopathy 07/22/2018   Lumbar disc herniation (L2/L3) 07/22/2018   Lumbar degenerative disc disease 07/22/2018   Chronic pain syndrome 07/22/2018   Neuropathy 07/22/2018   Localized, primary osteoarthritis 01/14/2018   Trochanteric bursitis of left hip 01/14/2018   Closed fracture of  fifth toe of right foot 04/17/2017   Left leg pain 04/24/2016   Mixed hyperlipidemia 02/05/2016   History of fall 01/24/2016   Sensory ataxia 01/24/2016   Benign essential HTN 09/21/2015   Moderate tricuspid insufficiency 09/21/2015   Numbness in feet 04/12/2015   Mild vitamin D deficiency 10/23/2014   Allergic rhinitis 05/15/2014   Bilateral hip pain 09/06/2013   Dysphagia 04/15/2013   Squamous cell carcinoma 04/15/2013   Thyroid nodule 04/15/2013   Lumbar facet arthropathy 12/07/2012   Stenosis of lumbosacral spine 12/07/2012   IBS (irritable bowel syndrome) 04/12/2012   Acquired hypothyroidism 06/24/2011   Osteopenia 05/14/2011    Past Surgical History:  Procedure Laterality Date   ABDOMINAL HYSTERECTOMY     BREAST BIOPSY Bilateral    neg   BREAST EXCISIONAL BIOPSY Right     OB History   No obstetric history on file.      Home Medications    Prior to Admission medications   Medication Sig Start Date End Date Taking? Authorizing Provider  acetaminophen (TYLENOL) 500 MG tablet Take 1,000 mg by mouth every 6 (six) hours as needed.    [provider]  alendronate (FOSAMAX) 70 MG tablet Take 70 mg by mouth every 7 (seven) days. 01/26/21   [provider]  beta carotene w/minerals (OCUVITE) tablet Take 1 tablet by mouth daily.    [provider]  bimatoprost (LUMIGAN) 0.03 %  ophthalmic solution Place 1 drop into both eyes at bedtime. Patient not taking: Reported on 12/12/2021    [provider]  dicyclomine (BENTYL) 10 MG capsule Take 10 mg by mouth 4 (four) times daily -  before meals and at bedtime.     [provider]  donepezil (ARICEPT ODT) 10 MG disintegrating tablet Take 10 mg by mouth at bedtime. 05/15/21   [provider]  dorzolamide (TRUSOPT) 2 % ophthalmic solution Place 1 drop into the right eye 2 (two) times daily. 04/14/22   [provider]  DULoxetine HCl 40 MG CPEP Take 40 mg by mouth daily. 10/29/21    [provider]  estradiol (ESTRACE) 0.1 MG/GM vaginal cream APPLY ONE PEA-SIZED AMOUNT AROUND THE OPENING OF THE URETHRA THREE TIMES WEEKLY. Patient not taking: Reported on 04/02/2022 06/06/21   Debroah Loop, PA-C  fexofenadine (ALLEGRA) 180 MG tablet Take 180 mg by mouth daily. Patient not taking: Reported on 04/02/2022    [provider]  gabapentin (NEURONTIN) 300 MG capsule Take 300 mg by mouth 3 (three) times daily. 300 mg tid and 600 mg qhs 10/29/21   [provider]  latanoprost (XALATAN) 0.005 % ophthalmic solution  09/19/20   [provider]  levothyroxine (SYNTHROID) 100 MCG tablet Take 100 mcg by mouth daily. 09/05/20   [provider]  lovastatin (MEVACOR) 20 MG tablet Take 20 mg by mouth daily. 01/04/20   [provider]  LUMIGAN 0.01 % SOLN Place 1 drop into both eyes at bedtime. Patient not taking: Reported on 01/21/2022 12/25/20   [provider]  Melatonin 1 MG TABS Take 10 mg by mouth once.    [provider]  montelukast (SINGULAIR) 10 MG tablet Take 10 mg by mouth daily. Patient not taking: Reported on 01/21/2022 09/13/21   [provider]  nortriptyline (PAMELOR) 10 MG capsule Take 10 mg by mouth at bedtime. 01/16/21   [provider]  omeprazole (PRILOSEC) 20 MG capsule Take 20 mg by mouth daily. 01/13/22 01/13/23  [provider]  polyethylene glycol powder (GLYCOLAX/MIRALAX) 17 GM/SCOOP powder Take by mouth. 03/29/20   [provider]  Probiotic, Lactobacillus, CAPS Take by mouth.    [provider]  simvastatin (ZOCOR) 10 MG tablet Take 10 mg by mouth at bedtime.     [provider]    Family History Family History  Problem Relation Age of Onset   Breast cancer Mother 78   Breast cancer Maternal Uncle 33   Breast cancer Maternal Aunt        mat great aunt   Breast cancer Maternal Aunt 59   Tracheal cancer Father    Cancer Sister    Brain  cancer Sister    Cancer Brother     Social History Social History   Tobacco Use   Smoking status: Never   Smokeless tobacco: Never  Vaping Use   Vaping Use: Never used  Substance Use Topics   Alcohol use: No     Allergies   Macrobid [nitrofurantoin], Codeine, Hydrocodone, Oxycodone, Peanut-containing drug products, Peanuts [peanut oil], Penicillins, and Tramadol   Review of Systems Review of Systems  Respiratory:  Positive for shortness of breath. Negative for cough and wheezing.   Cardiovascular:  Positive for chest pain. Negative for palpitations.  Genitourinary:  Positive for flank pain.  Musculoskeletal:  Positive for arthralgias.  Skin:  Negative for color change.     Physical Exam Triage Vital Signs ED Triage Vitals  Enc  Vitals Group     BP 04/23/22 1422 110/65     Pulse Rate 04/23/22 1422 80     Resp 04/23/22 1422 17     Temp 04/23/22 1422 97.9 F (36.6 C)     Temp src --      SpO2 04/23/22 1422 98 %     Weight --      Height --      Head Circumference --      Peak Flow --      Pain Score 04/23/22 1420 8     Pain Loc --      Pain Edu? --      Excl. in [place]? --    No data found.  Updated Vital Signs BP 110/65 (BP Location: Right Arm)   Pulse 80   Temp 97.9 F (36.6 C)   Resp 17   SpO2 98%   Visual Acuity Right Eye Distance:   Left Eye Distance:   Bilateral Distance:    Right Eye Near:   Left Eye Near:    Bilateral Near:     Physical Exam Vitals and nursing note reviewed.  Constitutional:      Appearance: Normal appearance. She is not ill-appearing.  HENT:     Head: Normocephalic and atraumatic.  Cardiovascular:     Rate and Rhythm: Normal rate and regular rhythm.     Pulses: Normal pulses.     Heart sounds: Normal heart sounds. No murmur heard.    No friction rub. No gallop.  Pulmonary:     Effort: Pulmonary effort is normal.     Breath sounds: Normal breath sounds. No wheezing, rhonchi or rales.  Chest:     Chest wall:  Tenderness present.  Musculoskeletal:        General: No swelling, tenderness or deformity. Normal range of motion.  Skin:    General: Skin is warm and dry.     Capillary Refill: Capillary refill takes less than 2 seconds.     Findings: No bruising or erythema.  Neurological:     General: No focal deficit present.     Mental Status: She is alert and oriented to person, place, and time.  Psychiatric:        Mood and Affect: Mood normal.        Behavior: Behavior normal.        Thought Content: Thought content normal.        Judgment: Judgment normal.      UC Treatments / Results  Labs (all labs ordered are listed, but only abnormal results are displayed) Labs Reviewed - No data to display  EKG Sinus rhythm with a ventricular rate of 88 bpm Peer interval 188 ms QRS duration 100 ms QT/QTc 386/467 ms There are occasional PVCs, and incomplete right bundle branch block, and left anterior fascicular block.  No other current tracings available in epic though patient does have an EKG from 09/24/2021 indicating the same incomplete right bundle, left anterior fascicular block, and PVCs.   Radiology DG Chest 2 View  Result Date: 04/23/2022 CLINICAL DATA:  Right side chest pain, shortness of breath EXAM: CHEST - 2 VIEW COMPARISON:  04/19/2021 FINDINGS: The heart size and mediastinal contours are within normal limits. Both lungs are clear. The visualized skeletal structures are unremarkable. IMPRESSION: No active cardiopulmonary disease. Electronically Signed   By: Rolm Baptise M.D.   On: 04/23/2022 15:03    Procedures Procedures (including critical care time)  Medications Ordered in UC Medications -  No data to display  Initial Impression / Assessment and Plan / UC Course  I have reviewed the triage vital signs and the nursing notes.  Pertinent labs & imaging results that were available during my care of the patient were reviewed by me and considered in my medical decision making (see  chart for details).  Patient is a nontoxic-appearing 88 year old female here for evaluation of right-sided chest pain and shortness of breath that started yesterday as outlined in HPI above.  Physical exam reveals a patient who is alert and oriented x3 and is able to speak in full sentences without any dyspnea or tachypnea.  She reports that she had right-sided chest pain and points to the right side of her chest above her breast.  She states that this does not radiate but does cause her to be short of breath and have shortness of breath on exertion.  Patient did suffer a ground-level fall this morning onto a carpeted floor and landed on her right shoulder and right hip.  She is not complaining of any pain in her shoulder but she does report some pain in her hip and right flank.  Patient was able to transfer herself from the chair to the exam table without difficulty.  Cardiopulmonary exam reveals S1-S2 heart sounds with regular rate and rhythm and lung sounds that are clear to auscultation in all fields.  Patient does have tenderness with palpation of the anterior superior aspect of her chest wall.  No crepitus appreciated.  No ecchymosis noted over the chest wall, or over the right flank.  Patient's right shoulder also visualized and there is no edema, erythema, or ecchymosis.  She has full range of motion of her shoulder without difficulty.  Radial and ulnar pulses in her right arm are 2+.  Skin is warm and dry.  Patient's right hip was also visualized and there is no edema or ecchymosis present.  No pain with palpation of the greater trochanter.  Patient is ambulating with a normal gait.  Her EKG shows sinus rhythm with an occasional PVC, incomplete right bundle branch block, and left anterior fascicular block.  These are unchanged when compared to 09/24/2021.  I will obtain chest x-ray to look for any acute thoracic process.  Chest x-ray independently reviewed and evaluated by me.  Impression: Spaces are well  aerated.  There is no evidence of effusion, infiltrate, or pneumothorax.  Radiology overread is pending. Radiology impression states no active cardiopulmonary disease.  I will discharge the patient home with diagnosis of musculoskeletal chest wall pain.  She can use over-the-counter Tylenol according to the package instructions as needed for pain.  She can also apply topical lidocaine patches.   Final Clinical Impressions(s) / UC Diagnoses   Final diagnoses:  Chest wall pain     Discharge Instructions      Your EKG today did not show any evidence of heart injury and your chest x-ray did not show any evidence of chest abnormalities.  I believe the pain that you are experiencing, and the subsequent shortness of breath being caused by it, are musculoskeletal in origin.  Use over-the-counter Tylenol  according to the package instructions as needed for pain relief.  You can also apply topical, over-the-counter, lidocaine patches to your chest wall.  These can be left on for 8 hours to help with pain relief.  If your symptoms continue, or new symptoms develop, either return for reevaluation or see your primary care provider.     ED  Prescriptions   None    PDMP not reviewed this encounter.   Margarette Canada, NP 04/23/22 1513

## 2022-04-23 NOTE — Telephone Encounter (Signed)
LM for patient to call office for pre virtual appointment.  

## 2022-04-23 NOTE — ED Triage Notes (Signed)
Pt reports right upper chest pains that started pains that has progressed since yesterday and is constant. Reports today pain is worse with moving and now having SOB with exertion.  Pt reports that tripped this morning over her feet and landed on right side. Denies LOC or hitting head.

## 2022-04-23 NOTE — Discharge Instructions (Addendum)
Your EKG today did not show any evidence of heart injury and your chest x-ray did not show any evidence of chest abnormalities.  I believe the pain that you are experiencing, and the subsequent shortness of breath being caused by it, are musculoskeletal in origin.  Use over-the-counter Tylenol  according to the package instructions as needed for pain relief.  You can also apply topical, over-the-counter, lidocaine patches to your chest wall.  These can be left on for 8 hours to help with pain relief.  If your symptoms continue, or new symptoms develop, either return for reevaluation or see your primary care provider.

## 2022-04-24 ENCOUNTER — Encounter: Payer: Self-pay | Admitting: Student in an Organized Health Care Education/Training Program

## 2022-04-24 ENCOUNTER — Telehealth: Payer: Self-pay | Admitting: Student in an Organized Health Care Education/Training Program

## 2022-04-24 ENCOUNTER — Ambulatory Visit
Payer: Medicare Other | Attending: Student in an Organized Health Care Education/Training Program | Admitting: Student in an Organized Health Care Education/Training Program

## 2022-04-24 DIAGNOSIS — M25551 Pain in right hip: Secondary | ICD-10-CM

## 2022-04-24 DIAGNOSIS — M160 Bilateral primary osteoarthritis of hip: Secondary | ICD-10-CM

## 2022-04-24 DIAGNOSIS — M25552 Pain in left hip: Secondary | ICD-10-CM | POA: Diagnosis not present

## 2022-04-24 DIAGNOSIS — G894 Chronic pain syndrome: Secondary | ICD-10-CM

## 2022-04-24 NOTE — Progress Notes (Signed)
Patient: Sarah [name]  Service Category: E/M  Provider: Gillis Santa, MD  DOB: Jun 25, 1932  DOS: 04/24/2022  Location: Office  MRN: [id number]  Setting: Ambulatory outpatient  Referring Provider: Sharyne Peach, MD  Type: Established Patient  Specialty: Interventional Pain Management  PCP: Sharyne Peach, MD  Location: Remote location  Delivery: TeleHealth     Virtual Encounter - Pain Management PROVIDER NOTE: Information contained herein reflects review and annotations entered in association with encounter. Interpretation of such information and data should be left to medically-trained personnel. Information provided to patient can be located elsewhere in the medical record under "Patient Instructions". Document created using STT-dictation technology, any transcriptional errors that may result from process are unintentional.    Contact & Pharmacy Preferred: [phone] Home: [phone] (home) Mobile: [phone] (mobile) E-mail: [email or web address]  [id number] [place] [address]Phone: [phone]: [phone] [facility] [place]Phone: [phone]: [phone]  Pre-screening  [name] "in-person" vs "virtual" encounter. She indicated preferring virtual for this encounter.   Reason COVID-19*  Social distancing based on CDC and AMA recommendations.   I contacted PEdwena Bundeon 04/24/2022 via telephone.      I clearly identified myself as BGillis Santa MD. I verified that I was speaking with the correct person using two identifiers (Name: PLevora [name] and date of birth: 109-06-1932.  Consent I sought verbal advanced consent from [name]. I informed Ms. GGroutof possible security and privacy concerns, risks, and limitations associated  with providing "not-in-person" medical evaluation and management services. I also informed Ms. GHarlingof the availability of "in-person" appointments. Finally, I informed her that there would be a charge for the virtual visit and that she could be  personally, fully or partially, financially responsible for it. Ms. GOhalloranexpressed understanding and agreed to proceed.   Historic Elements   Ms. PDoreen Garretsonis a 88y.o. year old, female patient evaluated today after our last contact on 04/24/2022. Ms. GSotto has a past medical history of Benign essential HTN (09/21/2015), [place], [place], and Thyroid disease. She also  has a past surgical history that includes Abdominal hysterectomy; Breast biopsy (Bilateral); and Breast excisional biopsy (Right). Ms. GSwabyhas a current medication list which includes the following prescription(s): acetaminophen, alendronate, beta carotene w/minerals, bimatoprost, dicyclomine, donepezil, dorzolamide, duloxetine hcl, estradiol, fexofenadine, gabapentin, latanoprost, levothyroxine, lovastatin, melatonin, nortriptyline, omeprazole, polyethylene glycol powder, probiotic (lactobacillus), simvastatin, lumigan, and montelukast. She  reports that she has never smoked. She has never used smokeless tobacco. She reports that she does not drink alcohol. No history on file for drug use. Ms. GRonningis allergic to macrobid [nitrofurantoin], codeine, hydrocodone, oxycodone, peanut-containing drug products, peanuts [peanut oil], penicillins, and tramadol.   HPI  Today, she is being contacted for a post-procedure assessment.   Post-procedure evaluation    Procedure:          Anesthesia, Analgesia, Anxiolysis:  Type: Intra-Articular Hip Injection #1  Primary Purpose: Diagnostic/Therapeutric Region: Anterolateral hip joint area. Level: Lower pelvic and hip joint level. Approach: Anterior approach. Laterality: Bilateral  Anesthesia: Local (1-2% Lidocaine)   Anxiolysis: None  Sedation: None  Guidance: Fluoroscopy           Position: Supine Prepped Area: Entire Anterolateral hip area. DuraPrep (Iodine Povacrylex [  0.7% available iodine] and Isopropyl Alcohol, 74% w/w)   1. Bilateral primary osteoarthritis of hip   2. Bilateral hip pain    NAS-11 Pain score:   Pre-procedure: 4 /10   Post-procedure: 0-No pain/10      Effectiveness:  Initial hour after procedure: 100 %  Subsequent 4-6 hours post-procedure: 100 %  Analgesia past initial 6 hours: 100 % (Current)  Ongoing improvement:  Analgesic:  100% Function: Sarah [name] reports improvement in function ROM: Sarah [name] reports improvement in ROM   Laboratory Chemistry Profile   Renal Lab Results  Component Value Date   BUN 23 (H) 09/14/2015   CREATININE 0.80 09/12/2020   GFRAA 77 09/12/2020   GFRNONAA 67 09/12/2020    Hepatic Lab Results  Component Value Date   AST 19 11/26/2015   ALT 15 11/26/2015   ALBUMIN 4.7 11/26/2015   ALKPHOS 64 11/26/2015    Electrolytes Lab Results  Component Value Date   NA 141 09/14/2015   K 3.5 09/14/2015   CL 107 09/14/2015   CALCIUM 9.7 09/14/2015   MG 2.2 09/14/2015    Bone No results found for: "VD25OH", "VD125OH2TOT", "HW3888KC0", "KL4917HX5", "25OHVITD1", "25OHVITD2", "25OHVITD3", "TESTOFREE", "TESTOSTERONE"  Inflammation (CRP: Acute Phase) (ESR: Chronic Phase) No results found for: "CRP", "ESRSEDRATE", "LATICACIDVEN"       Note: Above Lab results reviewed.  Imaging  DG Chest 2 View CLINICAL DATA:  Right side chest pain, shortness of breath  EXAM: CHEST - 2 VIEW  COMPARISON:  04/19/2021  FINDINGS: The heart size and mediastinal contours are within normal limits. Both lungs are clear. The visualized skeletal structures are unremarkable.  IMPRESSION: No active cardiopulmonary disease.  Electronically Signed   By: Rolm Baptise M.D.   On: 04/23/2022 15:03  Assessment  The primary encounter diagnosis was Bilateral  primary osteoarthritis of hip. Diagnoses of Bilateral hip pain and Chronic pain syndrome were also pertinent to this visit.  Plan of Care  100% pain relief for bilateral intra-articular hip injections.  Finds bearing weight much easier and is in overall less pain.  We will continue to monitor.  Repeat as needed.    Follow-up plan:   Return if symptoms worsen or fail to improve.     Finds benefit with bilateral sacroiliac joint injection, previous one 03/14/2019 and then second 1 05/25/2019.  Also finds lumbar epidural steroid injections for when she has sciatic flare, consider repeating.  Previous one performed on 10/13/2018 at L2-L3.  Status post bilateral C4, C5, C6, C7 cervical facet medial branch nerve block on 03/05/2020, 05/21/20 for cervical facet arthropathy. Bilateral greater occipital nerve block 08/13/2020 helped significantly, repeat as needed.           Recent Visits Date Type Provider Dept  03/12/22 Procedure visit Gillis Santa, MD [facility]  Showing recent visits within past 90 days and meeting all other requirements Today's Visits Date Type Provider Dept  04/24/22 Office Visit Gillis Santa, MD [facility]  Showing today's visits and meeting all other requirements Future Appointments No visits were found meeting these conditions. Showing future appointments within next 90 days and meeting all other requirements  I discussed the assessment and treatment plan with the patient. The patient was provided an opportunity to ask questions and all were answered. The patient agreed with the plan and demonstrated an understanding of the instructions.  Patient advised to call back or seek an in-person evaluation if the symptoms or condition worsens.  Duration of encounter: 15 minutes.  Note by: Gillis Santa, MD Date: 04/24/2022; Time: 3:33 PM

## 2022-04-24 NOTE — Telephone Encounter (Signed)
PT was returning a phone call from nurse on 04-23-22. To answers some questions before her VV appt that's scheduled for today. Please give patient a call. Thanks

## 2022-04-24 NOTE — Telephone Encounter (Signed)
Done

## 2022-06-15 IMAGING — CR DG LUMBAR SPINE COMPLETE 4+V
5 series · 5 of 5 positions shown · non-contrast
Comparison: CT abdomen pelvis dated September 27, 2020.

CLINICAL DATA: Left leg pain.

EXAM:
LUMBAR SPINE - COMPLETE 4+ VIEW

[l-spine ap]
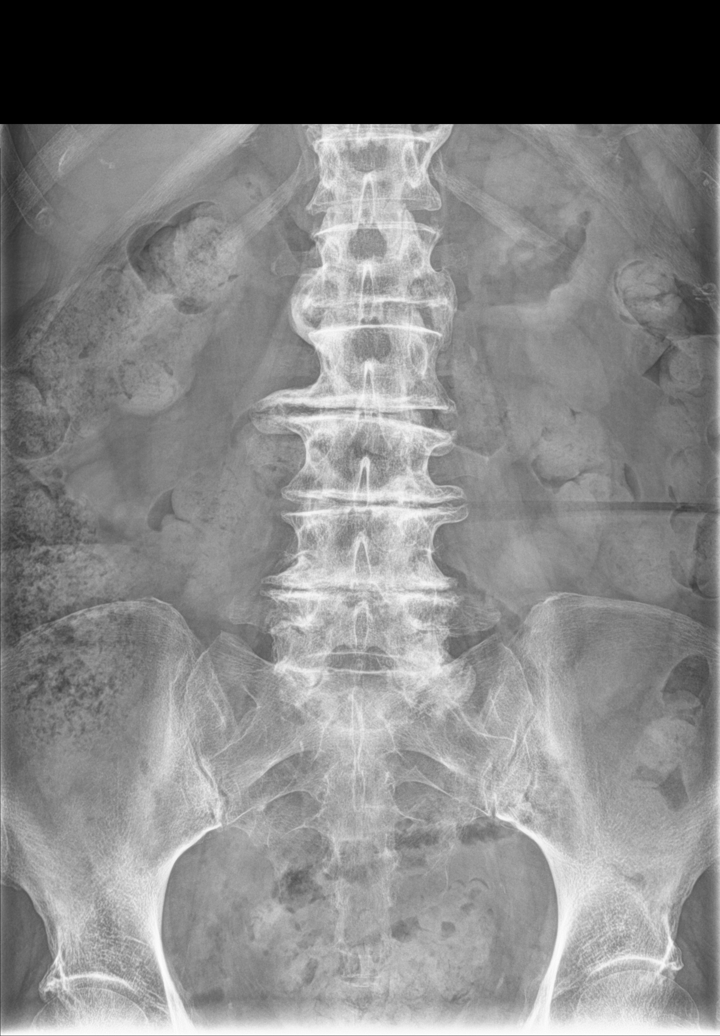

[l-spine obl (1 of 2)]
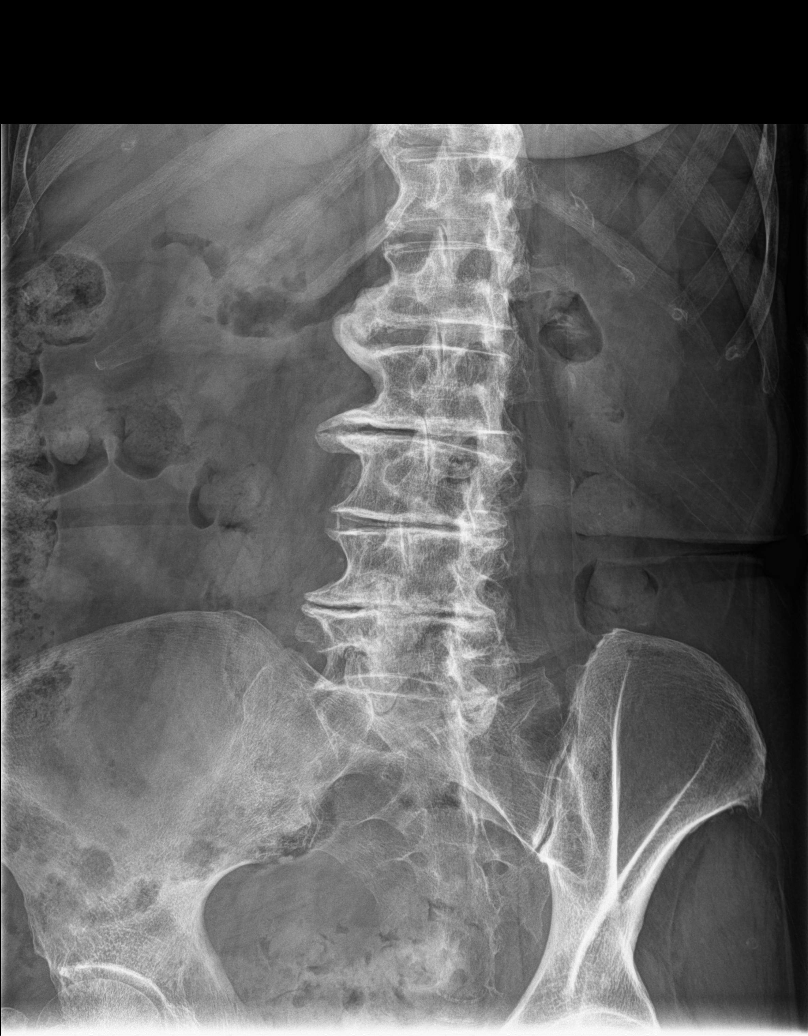

[l-spine obl (2 of 2)]
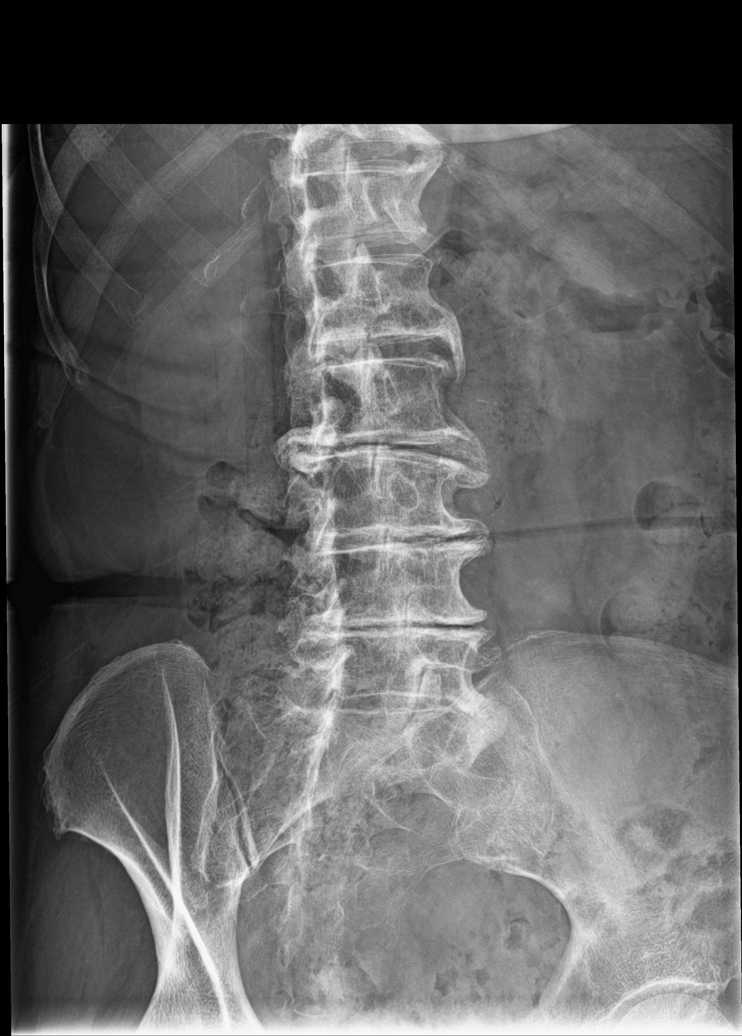

[l-spine lat]
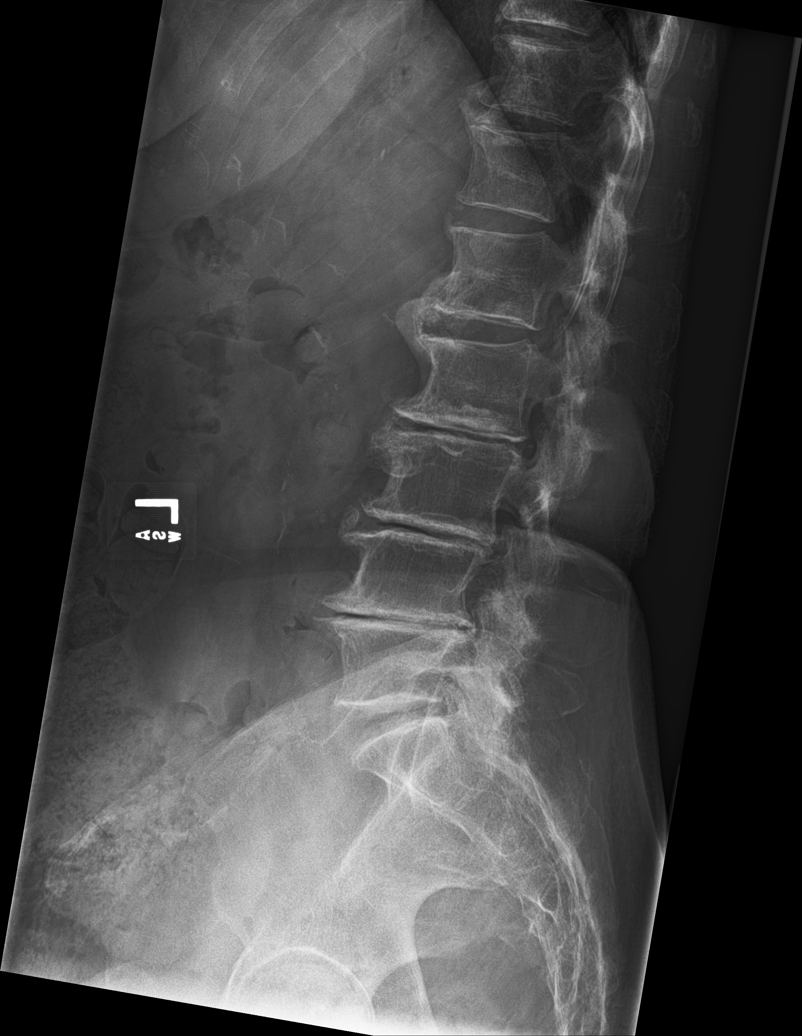

[l-spine spot]
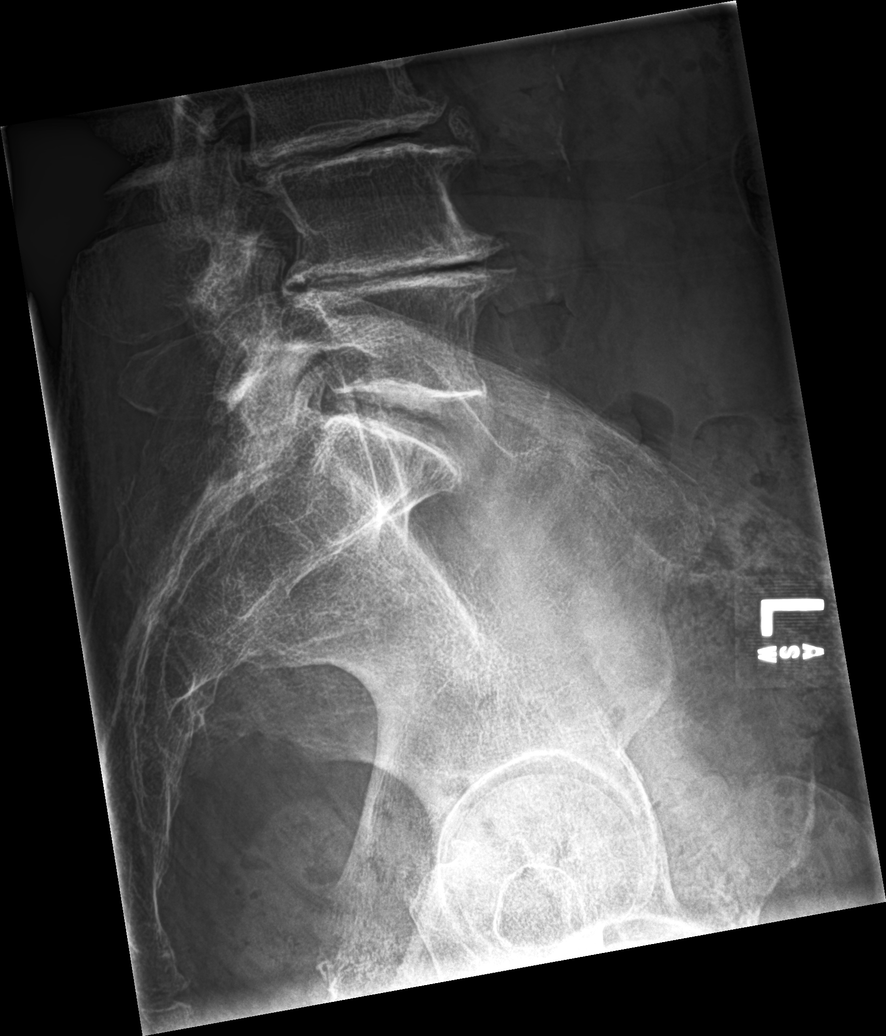

[5 of 5 positions shown; findings below may reference images not displayed]

FINDINGS: Five lumbar type vertebral bodies.

No acute fracture or subluxation. Vertebral body heights are
preserved.

Alignment is normal.

Unchanged severe disc height loss from L2-L3 through L4-L5.
Unchanged moderate to severe lower lumbar facet arthropathy.

The sacroiliac joints are unremarkable.
IMPRESSION: 1. Unchanged severe lumbar spondylosis.

## 2022-06-15 IMAGING — CR DG KNEE COMPLETE 4+V*L*
4 series · 4 of 4 positions shown · non-contrast
Comparison: Left knee x-rays dated November 30, 2013.

CLINICAL DATA: Left leg pain after bending over and feeling a pop.

EXAM:
LEFT KNEE - COMPLETE 4+ VIEW

[knee ap]
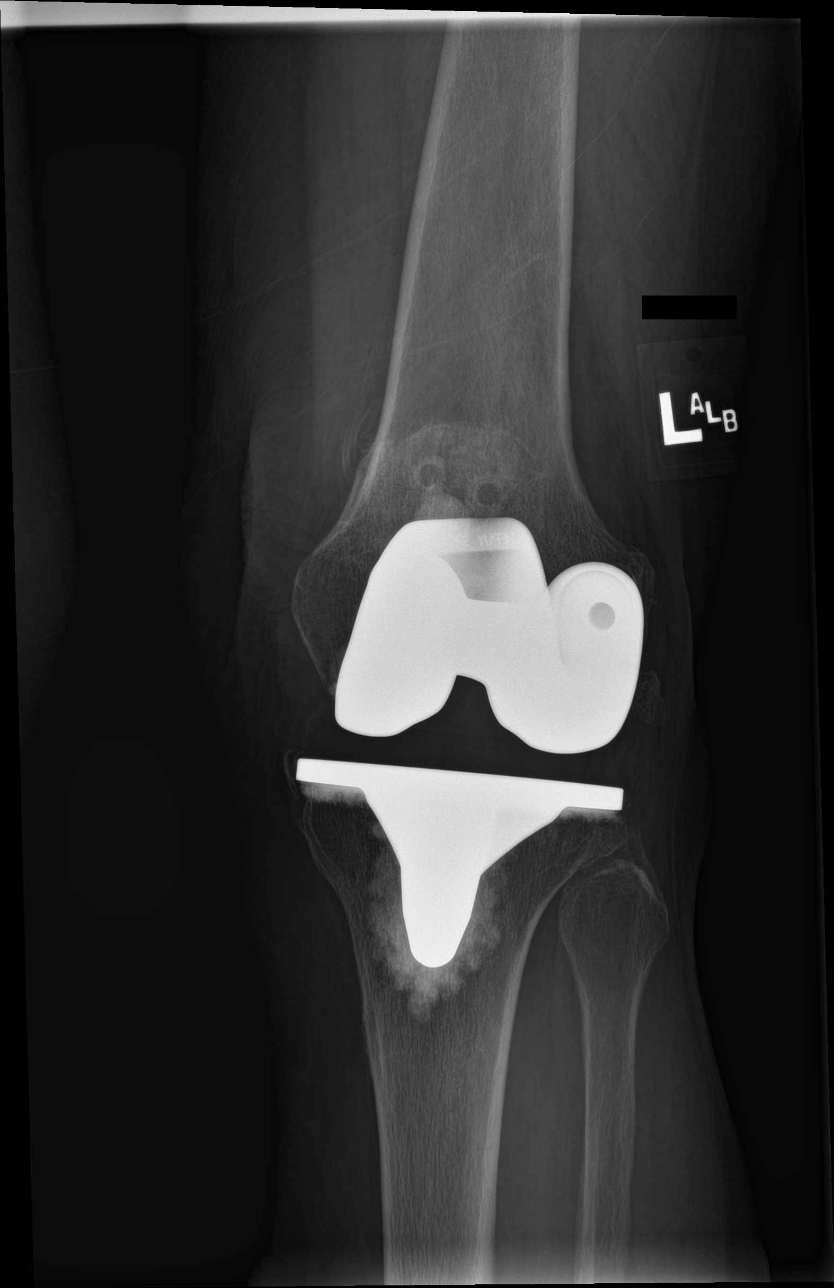

[knee lat]
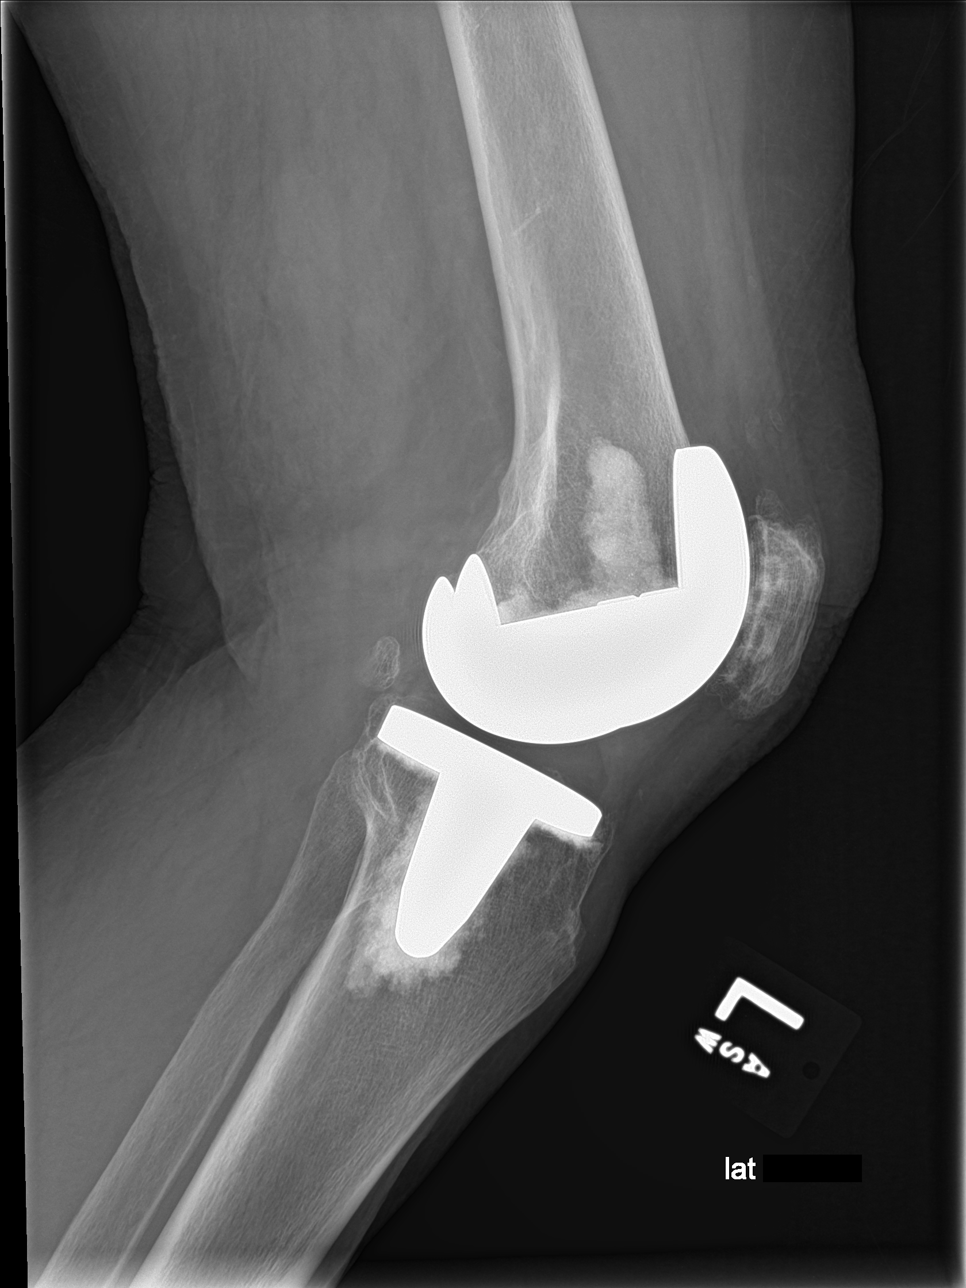

[tunnel]
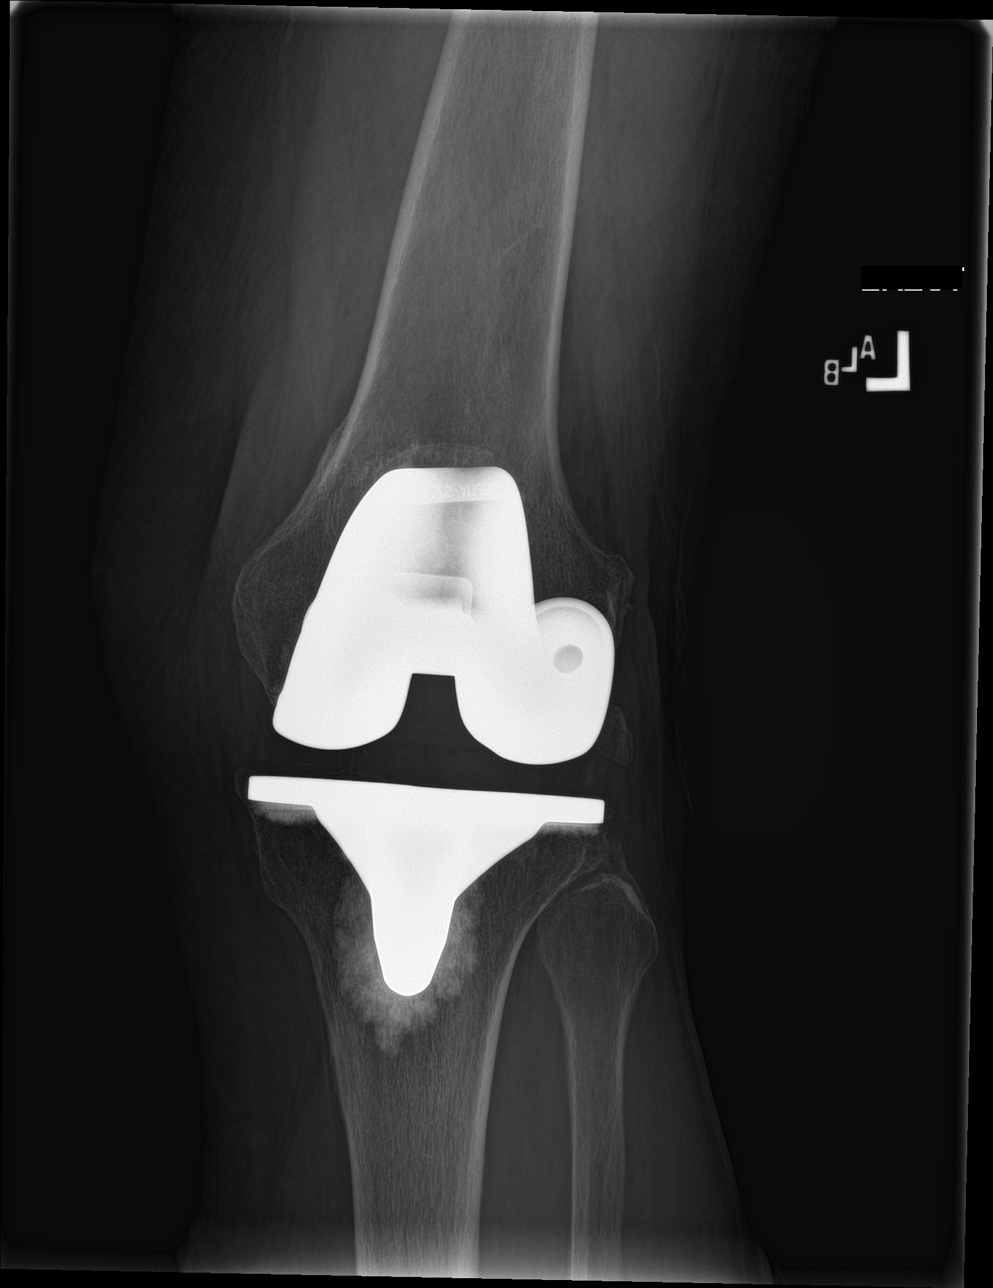

[patella skyline]
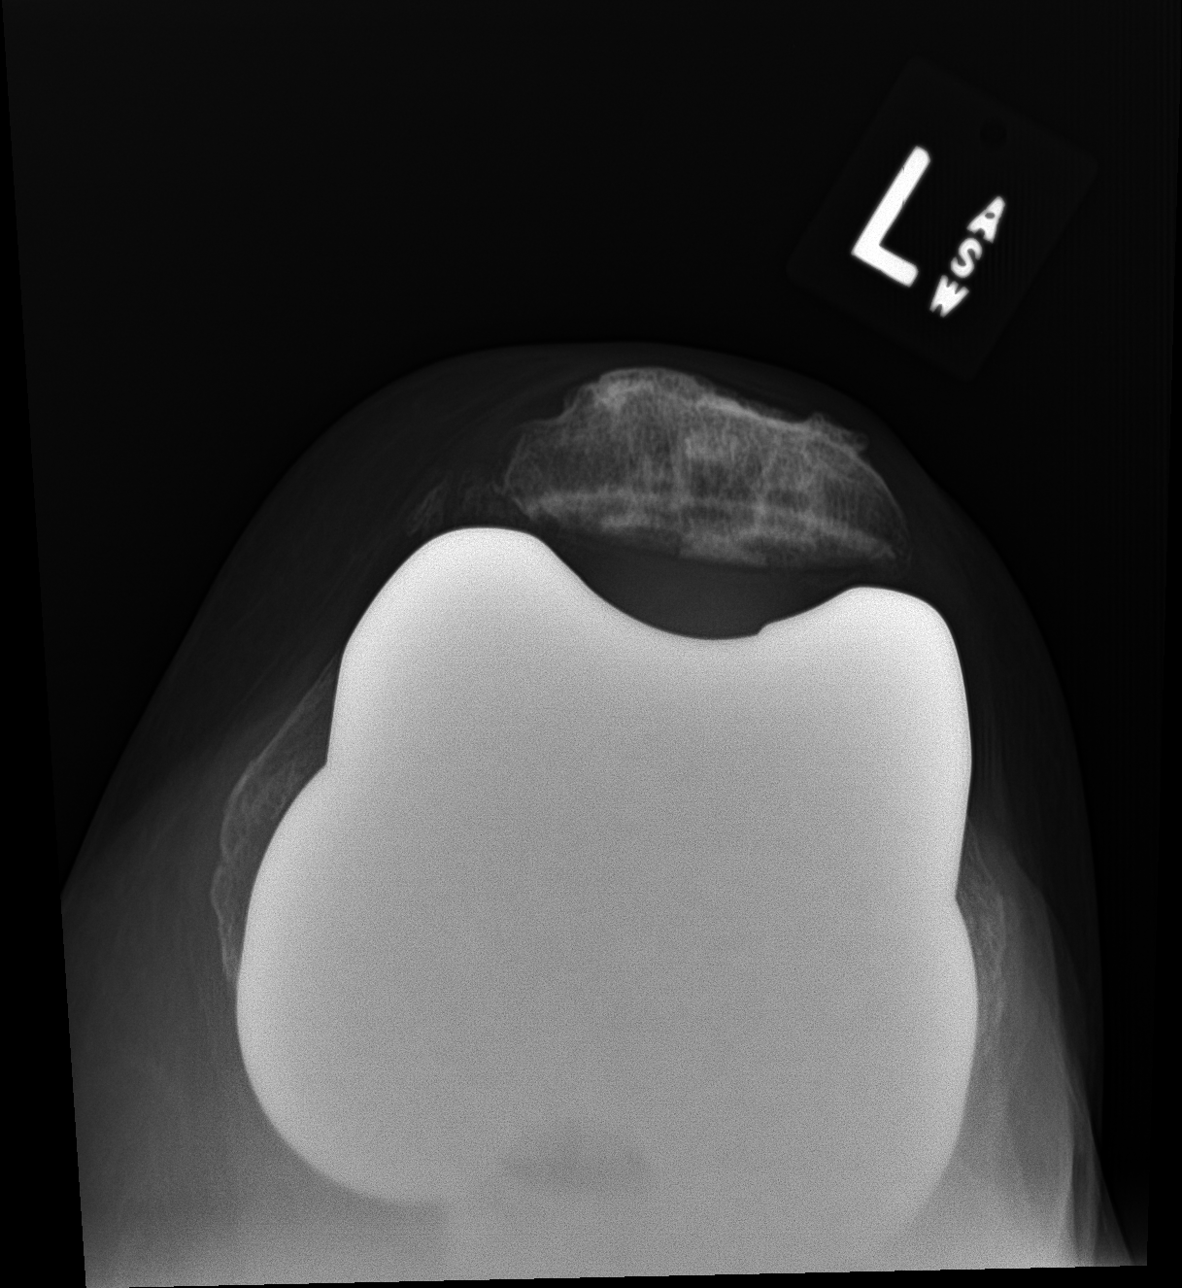

[4 of 4 positions shown; findings below may reference images not displayed]

FINDINGS: Prior total knee arthroplasty. No evidence of hardware failure or
loosening. No acute fracture or dislocation. No joint effusion. Soft
tissues are unremarkable.
IMPRESSION: 1. No acute osseous abnormality.
2. Prior total knee arthroplasty without hardware complication

## 2022-08-06 ENCOUNTER — Ambulatory Visit (INDEPENDENT_AMBULATORY_CARE_PROVIDER_SITE_OTHER): Payer: Medicare Other | Admitting: Physician Assistant

## 2022-08-06 VITALS — BP 101/65 | HR 90 | Ht 68.0 in | Wt 165.0 lb

## 2022-08-06 DIAGNOSIS — R1030 Lower abdominal pain, unspecified: Secondary | ICD-10-CM

## 2022-08-06 DIAGNOSIS — R300 Dysuria: Secondary | ICD-10-CM | POA: Diagnosis not present

## 2022-08-06 DIAGNOSIS — N390 Urinary tract infection, site not specified: Secondary | ICD-10-CM

## 2022-08-06 DIAGNOSIS — R6883 Chills (without fever): Secondary | ICD-10-CM

## 2022-08-06 LAB — URINALYSIS, COMPLETE
Bilirubin, UA: NEGATIVE
Glucose, UA: NEGATIVE
Ketones, UA: NEGATIVE
Nitrite, UA: NEGATIVE
Specific Gravity, UA: 1.02 (ref 1.005–1.030)
Urobilinogen, Ur: 2 mg/dL — ABNORMAL HIGH (ref 0.2–1.0)
pH, UA: 7 (ref 5.0–7.5)

## 2022-08-06 LAB — MICROSCOPIC EXAMINATION: WBC, UA: >30 /hpf — AB (ref 0–5)

## 2022-08-06 MED ORDER — SULFAMETHOXAZOLE-TRIMETHOPRIM 800-160 MG PO TABS: 1.0000 | ORAL_TABLET | Freq: Two times a day (BID) | ORAL | 0 refills | Status: AC

## 2022-08-06 NOTE — Progress Notes (Signed)
08/06/2022 1:42 PM   Sarah [name] 12/09/1931 [id number]  CC: Chief Complaint  Patient presents with   Urinary Tract Infection   HPI: Sarah [name] is a 88 y.o. female with PMH recurrent UTI on cranberry supplements, OAB, and vaginal atrophy on vaginal estrogen cream who presents today for evaluation of possible UTI.   Today she reports a 1 day history of dysuria, lower abdominal pain, and chills.  She denies fever, nausea, vomiting, gross hematuria, or flank pain.  In-office UA today positive for trace intact blood, 3+ protein, 2.0 EU/DL urobilinogen, and 1+ leukocytes; urine microscopy with >30 WBCs/HPF and moderate bacteria.   PMH: Past Medical History:  Diagnosis Date   Benign essential HTN 09/21/2015   Cancer ([place])    Skin CA resected from Right eyelid and both legs.   Seizures ([place])    Thyroid disease     Surgical History: Past Surgical History:  Procedure Laterality Date   ABDOMINAL HYSTERECTOMY     BREAST BIOPSY Bilateral    neg   BREAST EXCISIONAL BIOPSY Right     Home Medications:  Allergies as of 08/06/2022       Reactions   Macrobid [nitrofurantoin] Shortness Of Breath   Codeine Other (See Comments)   Makes her disoriented   Hydrocodone Other (See Comments)   Patient states she was seeing things that weren't there and very disoriented.   Oxycodone Other (See Comments)   hallucinations   Peanut-containing Drug Products    Other reaction(s): Headache   Peanuts [peanut Oil] Other (See Comments)   Violent Migraines   Penicillins Nausea Only   Tramadol Other (See Comments)   Dizziness.        Medication List        Accurate as of August 06, 2022  1:42 PM. If you have any questions, ask your nurse or doctor.          acetaminophen 500 MG tablet Commonly known as: TYLENOL Take 1,000 mg by mouth every 6 (six) hours as needed.   alendronate 70 MG tablet Commonly known as: FOSAMAX Take 70 mg by mouth every 7  (seven) days.   beta carotene w/minerals tablet Take 1 tablet by mouth daily.   bimatoprost 0.03 % ophthalmic solution Commonly known as: LUMIGAN Place 1 drop into both eyes at bedtime.   Lumigan 0.01 % Soln Generic drug: bimatoprost Place 1 drop into both eyes at bedtime.   dicyclomine 10 MG capsule Commonly known as: BENTYL Take 10 mg by mouth 4 (four) times daily -  before meals and at bedtime.   donepezil 10 MG disintegrating tablet Commonly known as: ARICEPT ODT Take 10 mg by mouth at bedtime.   dorzolamide 2 % ophthalmic solution Commonly known as: TRUSOPT Place 1 drop into the right eye 2 (two) times daily.   DULoxetine HCl 40 MG Cpep Take 40 mg by mouth daily.   estradiol 0.1 MG/GM vaginal cream Commonly known as: ESTRACE APPLY ONE PEA-SIZED AMOUNT AROUND THE OPENING OF THE URETHRA THREE TIMES WEEKLY.   fexofenadine 180 MG tablet Commonly known as: ALLEGRA Take 180 mg by mouth daily.   gabapentin 300 MG capsule Commonly known as: NEURONTIN Take 300 mg by mouth 3 (three) times daily. 300 mg tid and 600 mg qhs   latanoprost 0.005 % ophthalmic solution Commonly known as: XALATAN   levothyroxine 100 MCG tablet Commonly known as: SYNTHROID Take 100 mcg by mouth daily.   lovastatin 20 MG tablet Commonly known as: MEVACOR  Take 20 mg by mouth daily.   melatonin 1 MG Tabs tablet Take 10 mg by mouth once.   montelukast 10 MG tablet Commonly known as: SINGULAIR Take 10 mg by mouth daily.   nortriptyline 10 MG capsule Commonly known as: PAMELOR Take 10 mg by mouth at bedtime.   omeprazole 20 MG capsule Commonly known as: PRILOSEC Take 20 mg by mouth daily.   polyethylene glycol powder 17 GM/SCOOP powder Commonly known as: GLYCOLAX/MIRALAX Take by mouth.   Probiotic (Lactobacillus) Caps Take by mouth.   simvastatin 10 MG tablet Commonly known as: ZOCOR Take 10 mg by mouth at bedtime.   sulfamethoxazole-trimethoprim 800-160 MG tablet Commonly  known as: BACTRIM DS Take 1 tablet by mouth 2 (two) times daily for 5 days. Started by: Debroah Loop, PA-C        Allergies:  Allergies  Allergen Reactions   Macrobid [Nitrofurantoin] Shortness Of Breath   Codeine Other (See Comments)    Makes her disoriented   Hydrocodone Other (See Comments)    Patient states she was seeing things that weren't there and very disoriented.   Oxycodone Other (See Comments)    hallucinations   Peanut-Containing Drug Products     Other reaction(s): Headache   Peanuts [Peanut Oil] Other (See Comments)    Violent Migraines   Penicillins Nausea Only   Tramadol Other (See Comments)    Dizziness.    Family History: Family History  Problem Relation Age of Onset   Breast cancer Mother 23   Breast cancer Maternal Uncle 4   Breast cancer Maternal Aunt        mat great aunt   Breast cancer Maternal Aunt 41   Tracheal cancer Father    Cancer Sister    Brain cancer Sister    Cancer Brother     Social History:   reports that she has never smoked. She has never used smokeless tobacco. She reports that she does not drink alcohol. No history on file for drug use.  Physical Exam: BP 101/65   Pulse 90   Ht '5\' 8"'$  (1.727 m)   Wt 165 lb (74.8 kg)   BMI 25.09 kg/m   Constitutional:  Alert and oriented, no acute distress, nontoxic appearing HEENT: [place], AT Cardiovascular: No clubbing, cyanosis, or edema Respiratory: Normal respiratory effort, no increased work of breathing Skin: No rashes, bruises or suspicious lesions Neurologic: Grossly intact, no focal deficits, moving all 4 extremities Psychiatric: Normal mood and affect  Laboratory Data: Results for orders placed or performed in visit on 08/06/22  Microscopic Examination   Urine  Result Value Ref Range   WBC, UA >30 (A) 0 - 5 /hpf   RBC, Urine 0-2 0 - 2 /hpf   Epithelial Cells (non renal) 0-10 0 - 10 /hpf   Bacteria, UA Moderate (A) None seen/Few  Urinalysis, Complete  Result  Value Ref Range   Specific Gravity, UA 1.020 1.005 - 1.030   pH, UA 7.0 5.0 - 7.5   Color, UA Yellow Yellow   Appearance Ur Cloudy (A) Clear   Leukocytes,UA 1+ (A) Negative   Protein,UA 3+ (A) Negative/Trace   Glucose, UA Negative Negative   Ketones, UA Negative Negative   RBC, UA Trace (A) Negative   Bilirubin, UA Negative Negative   Urobilinogen, Ur 2.0 (H) 0.2 - 1.0 mg/dL   Nitrite, UA Negative Negative   Microscopic Examination See below:    Assessment & Plan:   1. Recurrent UTI UA appears grossly infected  today, will start empiric Bactrim and send for culture for further evaluation. - CULTURE, URINE COMPREHENSIVE - Urinalysis, Complete - sulfamethoxazole-trimethoprim (BACTRIM DS) 800-160 MG tablet; Take 1 tablet by mouth 2 (two) times daily for 5 days.  Dispense: 10 tablet; Refill: 0  Return if symptoms worsen or fail to improve.  Debroah Loop, PA-C  [facility] [address] [place], [place] [address] [phone]

## 2022-08-06 NOTE — Progress Notes (Signed)
In and Out Catheterization  Patient is present today for a I & O catheterization due to UTI. Patient was cleaned and prepped in a sterile fashion with betadine . A 14FR cath was inserted no complications were noted , 70m of urine return was noted, urine was yellow in color. A clean urine sample was collected for UA,UCX. Bladder was drained  And catheter was removed with out difficulty.    Performed by: CElberta Leatherwood CSartell

## 2022-08-09 LAB — CULTURE, URINE COMPREHENSIVE

## 2022-09-10 ENCOUNTER — Encounter: Payer: Self-pay | Admitting: Urology

## 2022-09-10 ENCOUNTER — Ambulatory Visit (INDEPENDENT_AMBULATORY_CARE_PROVIDER_SITE_OTHER): Payer: Medicare Other | Admitting: Urology

## 2022-09-10 VITALS — BP 123/74 | HR 80 | Ht 68.0 in

## 2022-09-10 DIAGNOSIS — R399 Unspecified symptoms and signs involving the genitourinary system: Secondary | ICD-10-CM | POA: Diagnosis not present

## 2022-09-10 DIAGNOSIS — Z87440 Personal history of urinary (tract) infections: Secondary | ICD-10-CM | POA: Diagnosis not present

## 2022-09-10 DIAGNOSIS — N390 Urinary tract infection, site not specified: Secondary | ICD-10-CM

## 2022-09-10 MED ORDER — CEPHALEXIN 250 MG PO CAPS
500.0000 mg | ORAL_CAPSULE | Freq: Once | ORAL | Status: AC
  Administered 2022-09-10: 500 mg via ORAL

## 2022-09-10 MED ORDER — LIDOCAINE HCL URETHRAL/MUCOSAL 2 % EX GEL
1.0000 | Freq: Once | CUTANEOUS | Status: AC
  Administered 2022-09-10: 1 via URETHRAL

## 2022-09-10 NOTE — Progress Notes (Signed)
Cystoscopy Procedure Note:  Indication: UTI symptoms and negative cultures  Prior history of negative cystoscopy in January 2022, set up for cystoscopy today by Thomas Hoff, PA  Keflex given for prophylaxis  After informed consent and discussion of the procedure and its risks, Sarah [name] was positioned and prepped in the standard fashion. Cystoscopy was performed with a flexible cystoscope. The urethra, bladder neck and entire bladder was visualized in a standard fashion. The ureteral orifices were visualized in their normal location and orientation.  Bladder mucosa grossly normal throughout, no abnormalities on retroflexion  Findings: Normal cystoscopy  Assessment and Plan: Encouraged her to resume topical estrogen cream.  Continue cranberry tablets twice daily.  Consider low-dose 90 days of an antibiotic if recurrent documented infections.  Consider sending culture for atypicals in the future.  RTC with Sarah [name] 3 to 4 months  Nickolas Madrid, MD 09/10/2022

## 2022-10-13 DIAGNOSIS — H35341 Macular cyst, hole, or pseudohole, right eye: Secondary | ICD-10-CM | POA: Diagnosis not present

## 2022-10-13 DIAGNOSIS — H35342 Macular cyst, hole, or pseudohole, left eye: Secondary | ICD-10-CM | POA: Diagnosis not present

## 2022-10-21 ENCOUNTER — Other Ambulatory Visit: Payer: Self-pay | Admitting: Physician Assistant

## 2022-10-21 DIAGNOSIS — R200 Anesthesia of skin: Secondary | ICD-10-CM | POA: Diagnosis not present

## 2022-10-21 DIAGNOSIS — R2689 Other abnormalities of gait and mobility: Secondary | ICD-10-CM

## 2022-10-21 DIAGNOSIS — R278 Other lack of coordination: Secondary | ICD-10-CM | POA: Diagnosis not present

## 2022-10-21 DIAGNOSIS — G629 Polyneuropathy, unspecified: Secondary | ICD-10-CM | POA: Diagnosis not present

## 2022-10-21 DIAGNOSIS — R296 Repeated falls: Secondary | ICD-10-CM | POA: Diagnosis not present

## 2022-10-21 DIAGNOSIS — R519 Headache, unspecified: Secondary | ICD-10-CM

## 2022-10-21 DIAGNOSIS — R413 Other amnesia: Secondary | ICD-10-CM

## 2022-10-21 DIAGNOSIS — M545 Low back pain, unspecified: Secondary | ICD-10-CM | POA: Diagnosis not present

## 2022-10-21 DIAGNOSIS — G8929 Other chronic pain: Secondary | ICD-10-CM | POA: Diagnosis not present

## 2022-10-21 DIAGNOSIS — R202 Paresthesia of skin: Secondary | ICD-10-CM | POA: Diagnosis not present

## 2022-10-29 ENCOUNTER — Ambulatory Visit
Admission: RE | Admit: 2022-10-29 | Discharge: 2022-10-29 | Disposition: A | Discharge status: Home / Self Care | Payer: PPO | Source: Ambulatory Visit | Attending: Physician Assistant | Admitting: Physician Assistant

## 2022-10-29 DIAGNOSIS — R2689 Other abnormalities of gait and mobility: Secondary | ICD-10-CM | POA: Diagnosis not present

## 2022-10-29 DIAGNOSIS — R413 Other amnesia: Secondary | ICD-10-CM | POA: Insufficient documentation

## 2022-10-29 DIAGNOSIS — R519 Headache, unspecified: Secondary | ICD-10-CM | POA: Insufficient documentation

## 2022-11-25 ENCOUNTER — Ambulatory Visit: Payer: PPO | Admitting: Physician Assistant

## 2022-11-25 VITALS — BP 93/60 | HR 96 | Ht 68.0 in | Wt 167.0 lb

## 2022-11-25 DIAGNOSIS — R300 Dysuria: Secondary | ICD-10-CM

## 2022-11-25 LAB — URINALYSIS, COMPLETE
Bilirubin, UA: NEGATIVE
Glucose, UA: NEGATIVE
Ketones, UA: NEGATIVE
Nitrite, UA: NEGATIVE
Specific Gravity, UA: 1.015 (ref 1.005–1.030)
Urobilinogen, Ur: 0.2 mg/dL (ref 0.2–1.0)
pH, UA: 7 (ref 5.0–7.5)

## 2022-11-25 LAB — MICROSCOPIC EXAMINATION: WBC, UA: >30 /hpf — AB (ref 0–5)

## 2022-11-25 MED ORDER — DOXYCYCLINE HYCLATE 100 MG PO CAPS: 100.0000 mg | ORAL_CAPSULE | Freq: Two times a day (BID) | ORAL | 0 refills | Status: AC

## 2022-11-25 NOTE — Progress Notes (Signed)
11/25/2022 1:49 PM   Sarah [name] July 03, 1932 [id number]  CC: Chief Complaint  Patient presents with   burning with urination   HPI: Sarah [name] is a 89 y.o. female with PMH recurrent UTI on cranberry supplements, OAB, and vaginal atrophy on vaginal estrogen cream with a recent history of sterile pyuria who presents today for evaluation of possible UTI.   Today she reports a 2-day history of dysuria, frequency, and chills with urination.  She denies fever, nausea, or vomiting.  She underwent repeat cystoscopy with Dr. Diamantina Providence on 09/10/2022 with no significant findings.  In-office UA today positive for trace lysed blood, trace protein, and 2+ leukocytes; urine microscopy with >30 WBCs/HPF and moderate bacteria.   PMH: Past Medical History:  Diagnosis Date   Benign essential HTN 09/21/2015   Cancer ([place])    Skin CA resected from Right eyelid and both legs.   Seizures ([place])    Thyroid disease     Surgical History: Past Surgical History:  Procedure Laterality Date   ABDOMINAL HYSTERECTOMY     BREAST BIOPSY Bilateral    neg   BREAST EXCISIONAL BIOPSY Right     Home Medications:  Allergies as of 11/25/2022       Reactions   Macrobid [nitrofurantoin] Shortness Of Breath   Codeine Other (See Comments)   Makes her disoriented   Hydrocodone Other (See Comments)   Patient states she was seeing things that weren't there and very disoriented.   Oxycodone Other (See Comments)   hallucinations   Peanut-containing Drug Products    Other reaction(s): Headache   Peanuts [peanut Oil] Other (See Comments)   Violent Migraines   Penicillins Nausea Only   Tramadol Other (See Comments)   Dizziness.        Medication List        Accurate as of November 25, 2022  1:49 PM. If you have any questions, ask your nurse or doctor.          acetaminophen 500 MG tablet Commonly known as: TYLENOL Take 1,000 mg by mouth every 6 (six) hours as needed.    alendronate 70 MG tablet Commonly known as: FOSAMAX Take 70 mg by mouth every 7 (seven) days.   beta carotene w/minerals tablet Take 1 tablet by mouth daily.   bimatoprost 0.03 % ophthalmic solution Commonly known as: LUMIGAN Place 1 drop into both eyes at bedtime.   Lumigan 0.01 % Soln Generic drug: bimatoprost Place 1 drop into both eyes at bedtime.   dicyclomine 10 MG capsule Commonly known as: BENTYL Take 10 mg by mouth 4 (four) times daily -  before meals and at bedtime.   donepezil 10 MG disintegrating tablet Commonly known as: ARICEPT ODT Take 10 mg by mouth at bedtime.   doxycycline 100 MG capsule Commonly known as: VIBRAMYCIN Take 1 capsule (100 mg total) by mouth 2 (two) times daily for 7 days.   DULoxetine HCl 40 MG Cpep Take 40 mg by mouth daily.   fexofenadine 180 MG tablet Commonly known as: ALLEGRA Take 180 mg by mouth daily.   gabapentin 300 MG capsule Commonly known as: NEURONTIN Take 300 mg by mouth 3 (three) times daily. 300 mg tid and 600 mg qhs   latanoprost 0.005 % ophthalmic solution Commonly known as: XALATAN   levothyroxine 100 MCG tablet Commonly known as: SYNTHROID Take 100 mcg by mouth daily.   lovastatin 20 MG tablet Commonly known as: MEVACOR Take 20 mg by mouth daily.  melatonin 1 MG Tabs tablet Take 10 mg by mouth once.   montelukast 10 MG tablet Commonly known as: SINGULAIR Take 10 mg by mouth daily.   nortriptyline 10 MG capsule Commonly known as: PAMELOR Take 10 mg by mouth at bedtime.   omeprazole 20 MG capsule Commonly known as: PRILOSEC Take 20 mg by mouth daily.   polyethylene glycol powder 17 GM/SCOOP powder Commonly known as: GLYCOLAX/MIRALAX Take by mouth.   Probiotic (Lactobacillus) Caps Take by mouth.   simvastatin 10 MG tablet Commonly known as: ZOCOR Take 10 mg by mouth at bedtime.        Allergies:  Allergies  Allergen Reactions   Macrobid [Nitrofurantoin] Shortness Of Breath   Codeine  Other (See Comments)    Makes her disoriented   Hydrocodone Other (See Comments)    Patient states she was seeing things that weren't there and very disoriented.   Oxycodone Other (See Comments)    hallucinations   Peanut-Containing Drug Products     Other reaction(s): Headache   Peanuts [Peanut Oil] Other (See Comments)    Violent Migraines   Penicillins Nausea Only   Tramadol Other (See Comments)    Dizziness.    Family History: Family History  Problem Relation Age of Onset   Breast cancer Mother 62   Breast cancer Maternal Uncle 81   Breast cancer Maternal Aunt        mat great aunt   Breast cancer Maternal Aunt 12   Tracheal cancer Father    Cancer Sister    Brain cancer Sister    Cancer Brother     Social History:   reports that she has never smoked. She has never used smokeless tobacco. She reports that she does not drink alcohol. No history on file for drug use.  Physical Exam: BP 93/60   Pulse 96   Ht '5\' 8"'$  (1.727 m)   Wt 167 lb (75.8 kg)   BMI 25.39 kg/m   Constitutional:  Alert and oriented, no acute distress, nontoxic appearing HEENT: [place], AT Cardiovascular: No clubbing, cyanosis, or edema Respiratory: Normal respiratory effort, no increased work of breathing Skin: No rashes, bruises or suspicious lesions Neurologic: Grossly intact, no focal deficits, moving all 4 extremities Psychiatric: Normal mood and affect  Laboratory Data: Results for orders placed or performed in visit on 11/25/22  Microscopic Examination   Urine  Result Value Ref Range   WBC, UA >30 (A) 0 - 5 /hpf   RBC, Urine 0-2 0 - 2 /hpf   Epithelial Cells (non renal) 0-10 0 - 10 /hpf   Bacteria, UA Moderate (A) None seen/Few  Urinalysis, Complete  Result Value Ref Range   Specific Gravity, UA 1.015 1.005 - 1.030   pH, UA 7.0 5.0 - 7.5   Color, UA Yellow Yellow   Appearance Ur Hazy (A) Clear   Leukocytes,UA 2+ (A) Negative   Protein,UA Trace (A) Negative/Trace   Glucose, UA  Negative Negative   Ketones, UA Negative Negative   RBC, UA Trace (A) Negative   Bilirubin, UA Negative Negative   Urobilinogen, Ur 0.2 0.2 - 1.0 mg/dL   Nitrite, UA Negative Negative   Microscopic Examination See below:    Assessment & Plan:   1. Burning with urination 2 days of acute onset irritative voiding symptoms.  UA with pyuria and bacteriuria consistent with prior.  Will send for standard and atypical cultures today and treat with empiric doxycycline. - Urinalysis, Complete - CULTURE, URINE COMPREHENSIVE - Mycoplasma /  ureaplasma culture - doxycycline (VIBRAMYCIN) 100 MG capsule; Take 1 capsule (100 mg total) by mouth 2 (two) times daily for 7 days.  Dispense: 14 capsule; Refill: 0   Return if symptoms worsen or fail to improve.  Debroah Loop, PA-C  [facility] [address] [place], [place] [address] [phone]

## 2022-11-28 LAB — CULTURE, URINE COMPREHENSIVE

## 2022-12-01 ENCOUNTER — Other Ambulatory Visit: Payer: Self-pay | Admitting: Family Medicine

## 2022-12-01 DIAGNOSIS — R9389 Abnormal findings on diagnostic imaging of other specified body structures: Secondary | ICD-10-CM

## 2022-12-02 LAB — MYCOPLASMA / UREAPLASMA CULTURE
Mycoplasma hominis Culture: NEGATIVE
Ureaplasma urealyticum: NEGATIVE

## 2022-12-04 ENCOUNTER — Encounter: Payer: Self-pay | Admitting: Student in an Organized Health Care Education/Training Program

## 2022-12-04 ENCOUNTER — Ambulatory Visit
Payer: PPO | Attending: Student in an Organized Health Care Education/Training Program | Admitting: Student in an Organized Health Care Education/Training Program

## 2022-12-04 VITALS — BP 114/77 | HR 96 | Temp 97.3°F | Resp 16 | Ht 68.0 in | Wt 160.0 lb

## 2022-12-04 DIAGNOSIS — M47816 Spondylosis without myelopathy or radiculopathy, lumbar region: Secondary | ICD-10-CM | POA: Diagnosis present

## 2022-12-04 DIAGNOSIS — G894 Chronic pain syndrome: Secondary | ICD-10-CM

## 2022-12-04 DIAGNOSIS — M5416 Radiculopathy, lumbar region: Secondary | ICD-10-CM | POA: Diagnosis present

## 2022-12-04 DIAGNOSIS — M160 Bilateral primary osteoarthritis of hip: Secondary | ICD-10-CM

## 2022-12-04 DIAGNOSIS — M5126 Other intervertebral disc displacement, lumbar region: Secondary | ICD-10-CM | POA: Diagnosis present

## 2022-12-04 NOTE — Progress Notes (Signed)
PROVIDER NOTE: Information contained herein reflects review and annotations entered in association with encounter. Interpretation of such information and data should be left to medically-trained personnel. Information provided to patient can be located elsewhere in the medical record under "Patient Instructions". Document created using STT-dictation technology, any transcriptional errors that may result from process are unintentional.    Patient: Sarah [name]  Service Category: E/M  Provider: Gillis Santa, MD  DOB: 05/12/1932  DOS: 12/04/2022  Referring Provider: Sharyne Peach, MD  MRN: [id number]  Specialty: Interventional Pain Management  PCP: Sharyne Peach, MD  Type: Established Patient  Setting: Ambulatory outpatient    Location: Office  Delivery: Face-to-face     HPI  Sarah [name], a 89 y.o. year old female, is here today because of her Lumbar facet arthropathy [M47.816]. Sarah [name] primary complain today is Back Pain (Lumbar lumbar bilateral )  Pertinent problems: Sarah [name] has Spondylosis of lumbar joint; Lumbar radiculopathy; Lumbar disc herniation (L2/L3); Lumbar degenerative disc disease; Chronic pain syndrome; Neuropathy; Thyroid nodule; and Bilateral occipital neuralgia on their pertinent problem list. Pain Assessment: Severity of Chronic pain is reported as a 5/10. Location: Back Lower, Left, Right/denies. Onset: More than a month ago. Quality: Discomfort, Nagging. Timing: Intermittent. Modifying factor(s): rest. Vitals:  height is '5\' 8"'$  (1.727 m) and weight is 160 lb (72.6 kg). Her temporal temperature is 97.3 F (36.3 C) (abnormal). Her blood pressure is 114/77 and her pulse is 96. Her respiration is 16 and oxygen saturation is 96%.  BMI: Estimated body mass index is 24.33 kg/m as calculated from the following:   Height as of this encounter: '5\' 8"'$  (1.727 m).   Weight as of this encounter: 160 lb (72.6 kg). Last encounter: 04/24/2022. Last procedure:  03/12/2022.  Reason for encounter: patient-requested evaluation.  Increased axial low back pain, worse with lumbar extension.  This is related to lumbar facet arthropathy, lumbar degenerative disc disease. She has done physical therapy in the past and is also try to engage in a home exercise program but she states that the pain is gone very severe and is limiting her ability to complete ADLs. We discussed diagnostic lumbar facet medial branch nerve blocks for lumbar facet joint syndrome.  Risk and benefits were reviewed and patient would like to proceed.   ROS  Constitutional: Denies any fever or chills Gastrointestinal: No reported hemesis, hematochezia, vomiting, or acute GI distress Musculoskeletal:  Low back pain, increased with lumbar extension Neurological: No reported episodes of acute onset apraxia, aphasia, dysarthria, agnosia, amnesia, paralysis, loss of coordination, or loss of consciousness  Medication Review  DULoxetine HCl, Probiotic (Lactobacillus), acetaminophen, alendronate, beta carotene w/minerals, bimatoprost, dicyclomine, donepezil, fexofenadine, gabapentin, latanoprost, levothyroxine, lovastatin, melatonin, montelukast, nortriptyline, omeprazole, polyethylene glycol powder, and simvastatin  History Review  Allergy: Sarah [name] is allergic to macrobid [nitrofurantoin], codeine, hydrocodone, oxycodone, peanut-containing drug products, peanuts [peanut oil], penicillins, and tramadol. Drug: Sarah [name]  has no history on file for drug use. Alcohol:  reports no history of alcohol use. Tobacco:  reports that she has never smoked. She has never used smokeless tobacco. Social: Sarah [name]  reports that she has never smoked. She has never used smokeless tobacco. She reports that she does not drink alcohol. Medical:  has a past medical history of Benign essential HTN (09/21/2015), Cancer ([place]), Seizures ([place]), and Thyroid disease. Surgical: Sarah [name]  has a past surgical history  that includes Abdominal hysterectomy; Breast biopsy (Bilateral); and Breast excisional biopsy (Right). Family: family history  includes Brain cancer in her sister; Breast cancer in her maternal aunt; Breast cancer (age of onset: 11) in her maternal aunt; Breast cancer (age of onset: 64) in her mother; Breast cancer (age of onset: 15) in her maternal uncle; Cancer in her brother and sister; Tracheal cancer in her father.  Laboratory Chemistry Profile   Renal Lab Results  Component Value Date   BUN 23 (H) 09/14/2015   CREATININE 0.80 09/12/2020   GFRAA 77 09/12/2020   GFRNONAA 67 09/12/2020    Hepatic Lab Results  Component Value Date   AST 19 11/26/2015   ALT 15 11/26/2015   ALBUMIN 4.7 11/26/2015   ALKPHOS 64 11/26/2015    Electrolytes Lab Results  Component Value Date   NA 141 09/14/2015   K 3.5 09/14/2015   CL 107 09/14/2015   CALCIUM 9.7 09/14/2015   MG 2.2 09/14/2015    Bone No results found for: "VD25OH", "VD125OH2TOT", "[id number]", "[id number]", "25OHVITD1", "25OHVITD2", "25OHVITD3", "TESTOFREE", "TESTOSTERONE"  Inflammation (CRP: Acute Phase) (ESR: Chronic Phase) No results found for: "CRP", "ESRSEDRATE", "LATICACIDVEN"       Note: Above Lab results reviewed.  Recent Imaging Review  MR BRAIN WO CONTRAST CLINICAL DATA:  89 year old female with imbalance and falls. Extremity cramping. Increasing memory loss. Intermittent headaches.  EXAM: MRI HEAD WITHOUT CONTRAST  TECHNIQUE: Multiplanar, multiecho pulse sequences of the brain and surrounding structures were obtained without intravenous contrast.  COMPARISON:  Brain MRI 11/03/2020.  FINDINGS: Brain: Cerebral volume is stable since 2022, within normal limits for age. No restricted diffusion to suggest acute infarction. No midline shift, mass effect, evidence of mass lesion, ventriculomegaly, extra-axial collection or acute intracranial hemorrhage. Cervicomedullary junction and pituitary are within normal  limits.  Pearline Cables and white matter signal is stable since the 2022 MRI, and largely normal for age. Evidence of mild for age chronic small vessel disease in the central left thalamus, possibly also pons. But no cortical encephalomalacia or chronic cerebral blood products identified. Mesial temporal lobe structures appear within normal limits for age. Cerebellum appears normal.  Vascular: Major intracranial vascular flow voids are stable. Tortuous dominant distal right vertebral artery again noted.  Skull and upper cervical spine: Chronic ligamentous hypertrophy about the odontoid, otherwise negative for age visible cervical spine. Visualized bone marrow signal is within normal limits.  Sinuses/Orbits: Stable, negative.  Other: Visible internal auditory structures appear normal. Mastoids remain clear. Negative stylomastoid foramina, visible scalp and face soft tissues.  IMPRESSION: No acute intracranial abnormality.  Stable since 2022 and largely normal for age noncontrast MRI appearance of the Brain; evidence of minimal to mild small vessel disease.  Electronically Signed   By: Genevie Ann M.D.   On: 10/31/2022 07:16  CLINICAL DATA:  Left leg pain.   EXAM: LUMBAR SPINE - COMPLETE 4+ VIEW   COMPARISON:  CT abdomen pelvis dated September 27, 2020.   FINDINGS: Five lumbar type vertebral bodies.   No acute fracture or subluxation. Vertebral body heights are preserved.   Alignment is normal.   Unchanged severe disc height loss from L2-L3 through L4-L5. Unchanged moderate to severe lower lumbar facet arthropathy.   The sacroiliac joints are unremarkable.   IMPRESSION: 1. Unchanged severe lumbar spondylosis.    Note: Reviewed        Physical Exam  General appearance: Well nourished, well developed, and well hydrated. In no apparent acute distress Mental status: Alert, oriented x 3 (person, place, & time)       Respiratory: No evidence of acute respiratory  distress Eyes:  PERLA Vitals: BP 114/77 (BP Location: Right Arm, Patient Position: Sitting, Cuff Size: Normal)   Pulse 96   Temp (!) 97.3 F (36.3 C) (Temporal)   Resp 16   Ht '5\' 8"'$  (1.727 m)   Wt 160 lb (72.6 kg)   SpO2 96%   BMI 24.33 kg/m  BMI: Estimated body mass index is 24.33 kg/m as calculated from the following:   Height as of this encounter: '5\' 8"'$  (1.727 m).   Weight as of this encounter: 160 lb (72.6 kg). Ideal: Ideal body weight: 63.9 kg (140 lb 14 oz) Adjusted ideal body weight: 67.4 kg (148 lb 8.4 oz)  Lumbar Spine Area Exam  Skin & Axial Inspection: No masses, redness, or swelling Alignment: Symmetrical Functional ROM: Pain restricted ROM affecting both sides Stability: No instability detected Muscle Tone/Strength: Functionally intact. No obvious neuro-muscular anomalies detected. Sensory (Neurological): Musculoskeletal pain pattern Palpation: Complains of area being tender to palpation       Provocative Tests: Hyperextension/rotation test: (+) bilaterally for facet joint pain. Lumbar quadrant test (Kemp's test): (+) bilaterally for facet joint pain.  Gait & Posture Assessment  Ambulation: Unassisted Gait: Relatively normal for age and body habitus Posture: WNL  Lower Extremity Exam    Side: Right lower extremity  Side: Left lower extremity  Stability: No instability observed          Stability: No instability observed          Skin & Extremity Inspection: Skin color, temperature, and hair growth are WNL. No peripheral edema or cyanosis. No masses, redness, swelling, asymmetry, or associated skin lesions. No contractures.  Skin & Extremity Inspection: Skin color, temperature, and hair growth are WNL. No peripheral edema or cyanosis. No masses, redness, swelling, asymmetry, or associated skin lesions. No contractures.  Functional ROM: Unrestricted ROM                  Functional ROM: Unrestricted ROM                  Muscle Tone/Strength: Functionally intact. No obvious  neuro-muscular anomalies detected.  Muscle Tone/Strength: Functionally intact. No obvious neuro-muscular anomalies detected.  Sensory (Neurological): Unimpaired        Sensory (Neurological): Unimpaired        DTR: Patellar: deferred today Achilles: deferred today Plantar: deferred today  DTR: Patellar: deferred today Achilles: deferred today Plantar: deferred today  Palpation: No palpable anomalies  Palpation: No palpable anomalies    Assessment   Diagnosis Status  1. Lumbar facet arthropathy (L4/5, L5/S1)   2. Spondylosis of lumbar joint   3. Lumbar radiculopathy   4. Lumbar disc herniation (L2/L3)   5. Bilateral primary osteoarthritis of hip   6. Chronic pain syndrome    Deteriorating Deteriorating Controlled   Updated Problems: Problem  Spondylosis of Lumbar Joint    Plan of Care  Avalon Englehart has a history of greater than 3 months of moderate to severe pain which is resulted in functional impairment.  The patient has tried various conservative therapeutic options such as NSAIDs, Tylenol, muscle relaxants, physical therapy which was inadequately effective.  Patient's pain is predominantly axial with physical exam and lumar spine xray findings suggestive of facet arthropathy. Lumbar facet medial branch nerve blocks were discussed with the patient.  Risks and benefits were reviewed.  Patient would like to proceed with bilateral L3, L4, L5 medial branch nerve block.   Orders:  Orders Placed This Encounter  Procedures  LUMBAR FACET(MEDIAL BRANCH NERVE BLOCK) MBNB    Standing Status:   Future    Standing Expiration Date:   03/06/2023    Scheduling Instructions:     Procedure: Lumbar facet block (AKA.: Lumbosacral medial branch nerve block)     Side: Bilateral     Level: L3-4, L4-5, Facets ( L3, L4, L5, Medial Branch)     Sedation: without     Timeframe: ASAA    Order Specific Question:   Where will this procedure be performed?    Answer:   [facility]    Follow-up plan:   Return in about 2 weeks (around 12/18/2022) for B/L L3,4,5 MBNB #1.     Recent Visits No visits were found meeting these conditions. Showing recent visits within past 90 days and meeting all other requirements Today's Visits Date Type Provider Dept  12/04/22 Office Visit Gillis Santa, MD [facility]  Showing today's visits and meeting all other requirements Future Appointments Date Type Provider Dept  01/05/23 Appointment Gillis Santa, MD [facility]  Showing future appointments within next 90 days and meeting all other requirements  I discussed the assessment and treatment plan with the patient. The patient was provided an opportunity to ask questions and all were answered. The patient agreed with the plan and demonstrated an understanding of the instructions.  Patient advised to call back or seek an in-person evaluation if the symptoms or condition worsens.  Duration of encounter: 82mnutes.  Total time on encounter, as per AMA guidelines included both the face-to-face and non-face-to-face time personally spent by the physician and/or other qualified health care professional(s) on the day of the encounter (includes time in activities that require the physician or other qualified health care professional and does not include time in activities normally performed by clinical staff). Physician's time may include the following activities when performed: Preparing to see the patient (e.g., pre-charting review of records, searching for previously ordered imaging, lab work, and nerve conduction tests) Review of prior analgesic pharmacotherapies. Reviewing PMP Interpreting ordered tests (e.g., lab work, imaging, nerve conduction tests) Performing post-procedure evaluations, including interpretation of diagnostic procedures Obtaining and/or reviewing separately obtained history Performing a medically appropriate examination and/or evaluation Counseling  and educating the patient/family/caregiver Ordering medications, tests, or procedures Referring and communicating with other health care professionals (when not separately reported) Documenting clinical information in the electronic or other health record Independently interpreting results (not separately reported) and communicating results to the patient/ family/caregiver Care coordination (not separately reported)  Note by: BGillis Santa MD Date: 12/04/2022; Time: 3:56 PM

## 2022-12-04 NOTE — Patient Instructions (Signed)

## 2022-12-04 NOTE — Progress Notes (Signed)
Safety precautions to be maintained throughout the outpatient stay will include: orient to surroundings, keep bed in low position, maintain call bell within reach at all times, provide assistance with transfer out of bed and ambulation.  

## 2022-12-12 ENCOUNTER — Telehealth: Payer: Self-pay | Admitting: *Deleted

## 2022-12-12 MED ORDER — ESTRADIOL 0.1 MG/GM VA CREA: TOPICAL_CREAM | VAGINAL | 11 refills | Status: DC

## 2022-12-12 MED ORDER — TRIMETHOPRIM 100 MG PO TABS: 100.0000 mg | ORAL_TABLET | Freq: Every day | ORAL | 0 refills | Status: DC

## 2022-12-12 NOTE — Telephone Encounter (Signed)
-----   Message from Debroah Loop, [place] sent at 12/11/2022  4:19 PM EDT ----- Both of her urine cultures from her recent office visit with me have come back negative. At this point she has had several episodes of acute UTI symptoms without positive urine cultures, as well as a recent, normal cystoscopy.   Most importantly, I strongly encourage her to continue vaginal estrogen cream 3 times weekly. In addition, I think we have two options here: first, we could try an OAB medication to specifically target the urgency/frequency (ok to provide samples of Myrbetriq 50mg  or Gemtesa based on availability if she prefers this option) or we could try 3 months of daily trimethoprim 100mg  daily. Trimethoprim is an antibiotic, but in this case when she hasn't been growing bacteria we'd be using it more for its anti-inflammatory effect to try to reduce her symptoms.  If she prefers OAB meds, please schedule her for 1 month symptom recheck and PVR with me. If trimethoprim, please schedule her to see me in 3 months for symptom recheck and UA.

## 2022-12-15 ENCOUNTER — Other Ambulatory Visit: Payer: PPO

## 2022-12-26 ENCOUNTER — Ambulatory Visit
Admission: RE | Admit: 2022-12-26 | Discharge: 2022-12-26 | Disposition: A | Discharge status: Home / Self Care | Payer: PPO | Source: Ambulatory Visit | Attending: Family Medicine | Admitting: Family Medicine

## 2022-12-26 DIAGNOSIS — R9389 Abnormal findings on diagnostic imaging of other specified body structures: Secondary | ICD-10-CM | POA: Diagnosis present

## 2022-12-26 LAB — POCT I-STAT CREATININE: Creatinine, Ser: 1.2 mg/dL — ABNORMAL HIGH (ref 0.44–1.00)

## 2022-12-26 MED ORDER — IOHEXOL 300 MG/ML  SOLN
75.0000 mL | Freq: Once | INTRAMUSCULAR | Status: AC | PRN
  Administered 2022-12-26: 75 mL via INTRAVENOUS

## 2022-12-29 ENCOUNTER — Other Ambulatory Visit: Payer: Self-pay | Admitting: Physician Assistant

## 2023-01-05 ENCOUNTER — Ambulatory Visit
Payer: PPO | Attending: Student in an Organized Health Care Education/Training Program | Admitting: Student in an Organized Health Care Education/Training Program

## 2023-01-05 ENCOUNTER — Encounter: Payer: Self-pay | Admitting: Student in an Organized Health Care Education/Training Program

## 2023-01-05 ENCOUNTER — Ambulatory Visit
Admission: RE | Admit: 2023-01-05 | Discharge: 2023-01-05 | Disposition: A | Discharge status: Home / Self Care | Payer: PPO | Source: Ambulatory Visit | Attending: Student in an Organized Health Care Education/Training Program | Admitting: Student in an Organized Health Care Education/Training Program

## 2023-01-05 VITALS — BP 138/90 | Temp 97.4°F | Resp 16 | Ht 68.0 in | Wt 160.0 lb

## 2023-01-05 DIAGNOSIS — M47816 Spondylosis without myelopathy or radiculopathy, lumbar region: Secondary | ICD-10-CM

## 2023-01-05 DIAGNOSIS — G894 Chronic pain syndrome: Secondary | ICD-10-CM

## 2023-01-05 MED ORDER — ROPIVACAINE HCL 2 MG/ML IJ SOLN
9.0000 mL | Freq: Once | INTRAMUSCULAR | Status: AC
  Administered 2023-01-05: 9 mL via PERINEURAL
  Filled 2023-01-05: qty 20

## 2023-01-05 MED ORDER — DEXAMETHASONE SODIUM PHOSPHATE 10 MG/ML IJ SOLN
10.0000 mg | Freq: Once | INTRAMUSCULAR | Status: AC
  Administered 2023-01-05: 10 mg
  Filled 2023-01-05: qty 1

## 2023-01-05 MED ORDER — ROPIVACAINE HCL 2 MG/ML IJ SOLN
9.0000 mL | Freq: Once | INTRAMUSCULAR | Status: AC
  Administered 2023-01-05: 9 mL via PERINEURAL

## 2023-01-05 MED ORDER — LIDOCAINE HCL 2 % IJ SOLN
20.0000 mL | Freq: Once | INTRAMUSCULAR | Status: AC
Start: 2023-01-05 — End: 2023-01-05
  Administered 2023-01-05: 400 mg
  Filled 2023-01-05: qty 20

## 2023-01-05 NOTE — Progress Notes (Signed)
Safety precautions to be maintained throughout the outpatient stay will include: orient to surroundings, keep bed in low position, maintain call bell within reach at all times, provide assistance with transfer out of bed and ambulation.  

## 2023-01-05 NOTE — Patient Instructions (Signed)

## 2023-01-05 NOTE — Progress Notes (Signed)
PROVIDER NOTE: Interpretation of information contained herein should be left to medically-trained personnel. Specific patient instructions are provided elsewhere under "Patient Instructions" section of medical record. This document was created in part using STT-dictation technology, any transcriptional errors that may result from this process are unintentional.  Patient: Sarah [name] Type: Established DOB: Aug 12, 1932 MRN: [id number] PCP: Rayetta Humphrey, MD  Service: Procedure DOS: 01/05/2023 Setting: Ambulatory Location: Ambulatory outpatient facility Delivery: Face-to-face Provider: Edward Jolly, MD Specialty: Interventional Pain Management Specialty designation: 09 Location: Outpatient facility Ref. Prov.: Rayetta Humphrey, MD       Interventional Therapy   Procedure: Lumbar Facet, Medial Branch Block(s) #1  Laterality: Bilateral  Level: L3, L4, and L5 Medial Branch Level(s). Injecting these levels blocks the L3-4 and L4-5 lumbar facet joints. Imaging: Fluoroscopic guidance         Anesthesia: Local anesthesia (1-2% Lidocaine) DOS: 01/05/2023 Performed by: Edward Jolly, MD  Primary Purpose: Diagnostic/Therapeutic Indications: Low back pain severe enough to impact quality of life or function. 1. Lumbar facet arthropathy (L4/5, L5/S1)   2. Spondylosis of lumbar joint   3. Chronic pain syndrome    NAS-11 Pain score:   Pre-procedure: 0-No pain/10   Post-procedure: 0-No pain/10     Position / Prep / Materials:  Position: Prone  Prep solution: DuraPrep (Iodine Povacrylex [0.7% available iodine] and Isopropyl Alcohol, 74% w/w) Area Prepped: Posterolateral Lumbosacral Spine (Wide prep: From the lower border of the scapula down to the end of the tailbone and from flank to flank.)  Materials:  Tray: Block Needle(s):  Type: Spinal  Gauge (G): 22  Length: 3.5-in Qty: 2      Pre-op H&P Assessment:  Sarah [name] is a 89 y.o. (year old), female patient, seen today for  interventional treatment. She  has a past surgical history that includes Abdominal hysterectomy; Breast biopsy (Bilateral); and Breast excisional biopsy (Right). Sarah [name] has a current medication list which includes the following prescription(s): acetaminophen, alendronate, beta carotene w/minerals, bimatoprost, dicyclomine, donepezil, duloxetine hcl, estradiol, fexofenadine, latanoprost, levothyroxine, lovastatin, lumigan, melatonin, montelukast, nortriptyline, omeprazole, polyethylene glycol powder, pregabalin, probiotic (lactobacillus), simvastatin, trimethoprim, and gabapentin. Her primarily concern today is the Back Pain (lower)  Initial Vital Signs:  Pulse/HCG Rate:  ECG Heart Rate: 80 Temp: (!) 97.4 F (36.3 C) Resp: 13 BP: 138/87 SpO2: 95 %  BMI: Estimated body mass index is 24.33 kg/m as calculated from the following:   Height as of this encounter: 5\' 8"  (1.727 m).   Weight as of this encounter: 160 lb (72.6 kg).  Risk Assessment: Allergies: Reviewed. She is allergic to macrobid [nitrofurantoin], codeine, hydrocodone, oxycodone, peanut-containing drug products, peanuts [peanut oil], penicillins, and tramadol.  Allergy Precautions: None required Coagulopathies: Reviewed. None identified.  Blood-thinner therapy: None at this time Active Infection(s): Reviewed. None identified. Sarah [name] is afebrile  Site Confirmation: Sarah [name] was asked to confirm the procedure and laterality before marking the site Procedure checklist: Completed Consent: Before the procedure and under the influence of no sedative(s), amnesic(s), or anxiolytics, the patient was informed of the treatment options, risks and possible complications. To fulfill our ethical and legal obligations, as recommended by the American Medical Association's Code of Ethics, I have informed the patient of my clinical impression; the nature and purpose of the treatment or procedure; the risks, benefits, and possible complications  of the intervention; the alternatives, including doing nothing; the risk(s) and benefit(s) of the alternative treatment(s) or procedure(s); and the risk(s) and benefit(s) of doing nothing. The patient  was provided information about the general risks and possible complications associated with the procedure. These may include, but are not limited to: failure to achieve desired goals, infection, bleeding, organ or nerve damage, allergic reactions, paralysis, and death. In addition, the patient was informed of those risks and complications associated to Spine-related procedures, such as failure to decrease pain; infection (i.e.: Meningitis, epidural or intraspinal abscess); bleeding (i.e.: epidural hematoma, subarachnoid hemorrhage, or any other type of intraspinal or peri-dural bleeding); organ or nerve damage (i.e.: Any type of peripheral nerve, nerve root, or spinal cord injury) with subsequent damage to sensory, motor, and/or autonomic systems, resulting in permanent pain, numbness, and/or weakness of one or several areas of the body; allergic reactions; (i.e.: anaphylactic reaction); and/or death. Furthermore, the patient was informed of those risks and complications associated with the medications. These include, but are not limited to: allergic reactions (i.e.: anaphylactic or anaphylactoid reaction(s)); adrenal axis suppression; blood sugar elevation that in diabetics may result in ketoacidosis or comma; water retention that in patients with history of congestive heart failure may result in shortness of breath, pulmonary edema, and decompensation with resultant heart failure; weight gain; swelling or edema; medication-induced neural toxicity; particulate matter embolism and blood vessel occlusion with resultant organ, and/or nervous system infarction; and/or aseptic necrosis of one or more joints. Finally, the patient was informed that Medicine is not an exact science; therefore, there is also the  possibility of unforeseen or unpredictable risks and/or possible complications that may result in a catastrophic outcome. The patient indicated having understood very clearly. We have given the patient no guarantees and we have made no promises. Enough time was given to the patient to ask questions, all of which were answered to the patient's satisfaction. Ms. Ravan has indicated that she wanted to continue with the procedure. Attestation: I, the ordering provider, attest that I have discussed with the patient the benefits, risks, side-effects, alternatives, likelihood of achieving goals, and potential problems during recovery for the procedure that I have provided informed consent. Date  Time: 01/05/2023  1:48 PM   Pre-Procedure Preparation:  Monitoring: As per clinic protocol. Respiration, ETCO2, SpO2, BP, heart rate and rhythm monitor placed and checked for adequate function Safety Precautions: Patient was assessed for positional comfort and pressure points before starting the procedure. Time-out: I initiated and conducted the "Time-out" before starting the procedure, as per protocol. The patient was asked to participate by confirming the accuracy of the "Time Out" information. Verification of the correct person, site, and procedure were performed and confirmed by me, the nursing staff, and the patient. "Time-out" conducted as per Joint Commission's Universal Protocol (UP.01.01.01). Time: 1432 Start Time: 1432 hrs.  Description of Procedure:          Laterality: (see above) Targeted Levels: (see above)  Safety Precautions: Aspiration looking for blood return was conducted prior to all injections. At no point did we inject any substances, as a needle was being advanced. Before injecting, the patient was told to immediately notify me if she was experiencing any new onset of "ringing in the ears, or metallic taste in the mouth". No attempts were made at seeking any paresthesias. Safe injection  practices and needle disposal techniques used. Medications properly checked for expiration dates. SDV (single dose vial) medications used. After the completion of the procedure, all disposable equipment used was discarded in the proper designated medical waste containers. Local Anesthesia: Protocol guidelines were followed. The patient was positioned over the fluoroscopy table. The area was prepped  in the usual manner. The time-out was completed. The target area was identified using fluoroscopy. A 12-in long, straight, sterile hemostat was used with fluoroscopic guidance to locate the targets for each level blocked. Once located, the skin was marked with an approved surgical skin marker. Once all sites were marked, the skin (epidermis, dermis, and hypodermis), as well as deeper tissues (fat, connective tissue and muscle) were infiltrated with a small amount of a short-acting local anesthetic, loaded on a 10cc syringe with a 25G, 1.5-in  Needle. An appropriate amount of time was allowed for local anesthetics to take effect before proceeding to the next step. Local Anesthetic: Lidocaine 2.0% The unused portion of the local anesthetic was discarded in the proper designated containers. Technical description of process:  L3 Medial Branch Nerve Block (MBB): The target area for the L3 medial branch is at the junction of the postero-lateral aspect of the superior articular process and the superior, posterior, and medial edge of the transverse process of L4. Under fluoroscopic guidance, a Quincke needle was inserted until contact was made with os over the superior postero-lateral aspect of the pedicular shadow (target area). After negative aspiration for blood, 2mL of the nerve block solution was injected without difficulty or complication. The needle was removed intact. L4 Medial Branch Nerve Block (MBB): The target area for the L4 medial branch is at the junction of the postero-lateral aspect of the superior articular  process and the superior, posterior, and medial edge of the transverse process of L5. Under fluoroscopic guidance, a Quincke needle was inserted until contact was made with os over the superior postero-lateral aspect of the pedicular shadow (target area). After negative aspiration for blood, 2mL of the nerve block solution was injected without difficulty or complication. The needle was removed intact. L5 Medial Branch Nerve Block (MBB): The target area for the L5 medial branch is at the junction of the postero-lateral aspect of the superior articular process and the superior, posterior, and medial edge of the sacral ala. Under fluoroscopic guidance, a Quincke needle was inserted until contact was made with os over the superior postero-lateral aspect of the pedicular shadow (target area). After negative aspiration for blood, 2mL of the nerve block solution was injected without difficulty or complication. The needle was removed intact.   Once the entire procedure was completed, the treated area was cleaned, making sure to leave some of the prepping solution back to take advantage of its long term bactericidal properties.         Illustration of the posterior view of the lumbar spine and the posterior neural structures. Laminae of L2 through S1 are labeled. DPRL5, dorsal primary ramus of L5; DPRS1, dorsal primary ramus of S1; DPR3, dorsal primary ramus of L3; FJ, facet (zygapophyseal) joint L3-L4; I, inferior articular process of L4; LB1, lateral branch of dorsal primary ramus of L1; IAB, inferior articular branches from L3 medial branch (supplies L4-L5 facet joint); IBP, intermediate branch plexus; MB3, medial branch of dorsal primary ramus of L3; NR3, third lumbar nerve root; S, superior articular process of L5; SAB, superior articular branches from L4 (supplies L4-5 facet joint also); TP3, transverse process of L3.   Facet Joint Innervation (* possible contribution)  L1-2 T12, L1 (L2*)  Medial Branch   L2-3 L1, L2 (L3*)         "          "  L3-4 L2, L3 (L4*)         "          "  L4-5 L3, L4 (L5*)         "          "  L5-S1 L4, L5, S1          "          "    Vitals:   01/05/23 1429 01/05/23 1433 01/05/23 1438 01/05/23 1443  BP: 135/72 (!) 140/94 (!) 142/92 (!) 138/90  Resp: Temp:      SpO2: 97% 100% 99% 98%  Weight:      Height:         End Time: 1439 hrs.  Imaging Guidance (Spinal):          Type of Imaging Technique: Fluoroscopy Guidance (Spinal) Indication(s): Assistance in needle guidance and placement for procedures requiring needle placement in or near specific anatomical locations not easily accessible without such assistance. Exposure Time: Please see nurses notes. Contrast: None used. Fluoroscopic Guidance: I was personally present during the use of fluoroscopy. "Tunnel Vision Technique" used to obtain the best possible view of the target area. Parallax error corrected before commencing the procedure. "Direction-depth-direction" technique used to introduce the needle under continuous pulsed fluoroscopy. Once target was reached, antero-posterior, oblique, and lateral fluoroscopic projection used confirm needle placement in all planes. Images permanently stored in EMR. Interpretation: No contrast injected. I personally interpreted the imaging intraoperatively. Adequate needle placement confirmed in multiple planes. Permanent images saved into the patient's record.  Post-operative Assessment:  Post-procedure Vital Signs:  Pulse/HCG Rate:  81 Temp: (!) 97.4 F (36.3 C) Resp: 16 BP: (!) 138/90 SpO2: 98 %  EBL: None  Complications: No immediate post-treatment complications observed by team, or reported by patient.  Note: The patient tolerated the entire procedure well. A repeat set of vitals were taken after the procedure and the patient was kept under observation following institutional policy, for this type of procedure. Post-procedural neurological  assessment was performed, showing return to baseline, prior to discharge. The patient was provided with post-procedure discharge instructions, including a section on how to identify potential problems. Should any problems arise concerning this procedure, the patient was given instructions to immediately contact us, at any time, without hesitation. In any case, we plan to contact the patient by telephone for a follow-up status report regarding this interventional procedure.  Comments:  No additional relevant information.  Plan of Care (POC)  Orders:  Orders Placed This Encounter  Procedures   DG PAIN [facility] C-ARM 1-60 MIN NO REPORT    Intraoperative interpretation by procedural physician at [facility].    Standing Status:   Standing    Number of Occurrences:   1    Order Specific Question:   Reason for exam:    Answer:   Assistance in needle guidance and placement for procedures requiring needle placement in or near specific anatomical locations not easily accessible without such assistance.    Medications ordered for procedure: Meds ordered this encounter  Medications   lidocaine (XYLOCAINE) 2 % (with pres) injection 400 mg   dexamethasone (DECADRON) injection 10 mg   dexamethasone (DECADRON) injection 10 mg   ropivacaine (PF) 2 mg/mL (0.2%) (NAROPIN) injection 9 mL   ropivacaine (PF) 2 mg/mL (0.2%) (NAROPIN) injection 9 mL   Medications administered: We administered lidocaine, dexamethasone, dexamethasone, ropivacaine (PF) 2 mg/mL (0.2%), and ropivacaine (PF) 2 mg/mL (0.2%).  See the medical record for exact dosing, route, and time of administration.  Follow-up plan:   Return in about 4  weeks (around 02/02/2023), or PPE, F2F.      Recent Visits Date Type Provider Dept  12/04/22 Office Visit Edward Jolly, MD [facility]  Showing recent visits within past 90 days and meeting all other requirements Today's Visits Date Type Provider Dept  01/05/23 Procedure  visit Edward Jolly, MD [facility]  Showing today's visits and meeting all other requirements Future Appointments Date Type Provider Dept  02/04/23 Appointment Edward Jolly, MD [facility]  Showing future appointments within next 90 days and meeting all other requirements  Disposition: Discharge home  Discharge (Date  Time): 01/05/2023; 1452 hrs.   Primary Care Physician: Rayetta Humphrey, MD Location: [facility] Note by: Edward Jolly, MD (TTS technology used. I apologize for any typographical errors that were not detected and corrected.) Date: 01/05/2023; Time: 3:13 PM  Disclaimer:  Medicine is not an Visual merchandiser. The only guarantee in medicine is that nothing is guaranteed. It is important to note that the decision to proceed with this intervention was based on the information collected from the patient. The Data and conclusions were drawn from the patient's questionnaire, the interview, and the physical examination. Because the information was provided in large part by the patient, it cannot be guaranteed that it has not been purposely or unconsciously manipulated. Every effort has been made to obtain as much relevant data as possible for this evaluation. It is important to note that the conclusions that lead to this procedure are derived in large part from the available data. Always take into account that the treatment will also be dependent on availability of resources and existing treatment guidelines, considered by other Pain Management Practitioners as being common knowledge and practice, at the time of the intervention. For Medico-Legal purposes, it is also important to point out that variation in procedural techniques and pharmacological choices are the acceptable norm. The indications, contraindications, technique, and results of the above procedure should only be interpreted and judged by a Board-Certified Interventional Pain Specialist  with extensive familiarity and expertise in the same exact procedure and technique.

## 2023-01-06 ENCOUNTER — Telehealth: Payer: Self-pay

## 2023-01-06 NOTE — Telephone Encounter (Signed)
Called PP. Denies any needs at this time. Instructed to call if needed. 

## 2023-01-15 ENCOUNTER — Ambulatory Visit: Payer: Medicare Other | Admitting: Physician Assistant

## 2023-01-20 ENCOUNTER — Ambulatory Visit
Admission: EM | Admit: 2023-01-20 | Discharge: 2023-01-20 | Disposition: A | Discharge status: Home / Self Care | Payer: PPO | Attending: Family | Admitting: Family

## 2023-01-20 ENCOUNTER — Ambulatory Visit (INDEPENDENT_AMBULATORY_CARE_PROVIDER_SITE_OTHER): Payer: PPO

## 2023-01-20 ENCOUNTER — Other Ambulatory Visit: Payer: Self-pay

## 2023-01-20 DIAGNOSIS — E612 Magnesium deficiency: Secondary | ICD-10-CM | POA: Diagnosis not present

## 2023-01-20 DIAGNOSIS — S8001XA Contusion of right knee, initial encounter: Secondary | ICD-10-CM | POA: Insufficient documentation

## 2023-01-20 LAB — MAGNESIUM: Magnesium: 2.3 mg/dL (ref 1.7–2.4)

## 2023-01-20 NOTE — ED Triage Notes (Signed)
Right knee pain after fall. Pt states right knee struck hardwood floor really hard.

## 2023-01-20 NOTE — ED Provider Notes (Signed)
[facility]    CSN: [id number] Arrival date & time: 01/20/23  1002      History   Chief Complaint Chief Complaint  Patient presents with   Knee Injury    Right knee pain after fall. Pt states right knee struck hardwood floor really hard.    HPI Sarah [name] is a 89 y.o. female for evaluation of knee pain.  Patient reports today she tripped and fell onto her right knee onto a hardwood floor.  Denies head injury or LOC.  She has a history of total right knee replacement and was concerned she injured the hardware.  Endorses some mild swelling but denies bruising or numbness or tingling.  No back or neck pain.  She has not taken any OTC medications for symptoms.  No other concerns at this time.  HPI  Past Medical History:  Diagnosis Date   Benign essential HTN 09/21/2015   Cancer (HCC)    Skin CA resected from Right eyelid and both legs.   Seizures (HCC)    Thyroid disease     Patient Active Problem List   Diagnosis Date Noted   Bilateral primary osteoarthritis of hip 01/22/2022   Bilateral occipital neuralgia 09/24/2020   Cervical facet joint syndrome 03/05/2020   Cervical spondylosis 03/01/2020   SI joint arthritis 06/23/2019   Chronic SI joint pain 06/23/2019   Carpal tunnel syndrome 06/07/2019   Low back pain 06/07/2019   Major depressive disorder with single episode, in full remission (HCC) 06/07/2019   Osteoarthritis of knee 06/07/2019   Pain in limb 06/07/2019   SOBOE (shortness of breath on exertion) 10/14/2018   Lumbar radiculopathy 07/22/2018   Lumbar disc herniation (L2/L3) 07/22/2018   Lumbar degenerative disc disease 07/22/2018   Chronic pain syndrome 07/22/2018   Neuropathy 07/22/2018   Localized, primary osteoarthritis 01/14/2018   Trochanteric bursitis of left hip 01/14/2018   Closed fracture of fifth toe of right foot 04/17/2017   Left leg pain 04/24/2016   Mixed hyperlipidemia 02/05/2016   History of fall 01/24/2016    Sensory ataxia 01/24/2016   Benign essential HTN 09/21/2015   Moderate tricuspid insufficiency 09/21/2015   Numbness in feet 04/12/2015   Mild vitamin D deficiency 10/23/2014   Allergic rhinitis 05/15/2014   Bilateral hip pain 09/06/2013   Dysphagia 04/15/2013   Squamous cell carcinoma 04/15/2013   Thyroid nodule 04/15/2013   Spondylosis of lumbar joint 12/07/2012   Stenosis of lumbosacral spine 12/07/2012   IBS (irritable bowel syndrome) 04/12/2012   Acquired hypothyroidism 06/24/2011   Osteopenia 05/14/2011    Past Surgical History:  Procedure Laterality Date   ABDOMINAL HYSTERECTOMY     BREAST BIOPSY Bilateral    neg   BREAST EXCISIONAL BIOPSY Right    KNEE ARTHROSCOPY W/ LATERAL RETINACULAR REPAIR      OB History   No obstetric history on file.      Home Medications    Prior to Admission medications   Medication Sig Start Date End Date Taking? Authorizing Provider  acetaminophen (TYLENOL) 500 MG tablet Take 1,000 mg by mouth every 6 (six) hours as needed.    [provider]  alendronate (FOSAMAX) 70 MG tablet Take 70 mg by mouth every 7 (seven) days. 01/26/21   [provider]  beta carotene w/minerals (OCUVITE) tablet Take 1 tablet by mouth daily.    [provider]  bimatoprost (LUMIGAN) 0.03 % ophthalmic solution Place 1 drop into both eyes at bedtime.    [provider]  dicyclomine (BENTYL) 10 MG capsule Take 10 mg by mouth 4 (four) times daily -  before meals and at bedtime.     [provider]  donepezil (ARICEPT ODT) 10 MG disintegrating tablet Take 10 mg by mouth at bedtime. 05/15/21   [provider]  DULoxetine HCl 40 MG CPEP Take 40 mg by mouth daily. 10/29/21   [provider]  estradiol (ESTRACE) 0.1 MG/GM vaginal cream Estrogen Cream Instruction Discard applicator Apply pea sized amount to tip of finger to urethra before bed. Wash hands well after application. Use Monday, Wednesday and Friday  12/12/22   Carman Ching, PA-C  fexofenadine (ALLEGRA) 180 MG tablet Take 180 mg by mouth daily.    [provider]  gabapentin (NEURONTIN) 300 MG capsule Take 300 mg by mouth 3 (three) times daily. 300 mg tid and 600 mg qhs Patient not taking: Reported on 01/05/2023 10/29/21   [provider]  latanoprost (XALATAN) 0.005 % ophthalmic solution  09/19/20   [provider]  levothyroxine (SYNTHROID) 100 MCG tablet Take 100 mcg by mouth daily. 09/05/20   [provider]  lovastatin (MEVACOR) 20 MG tablet Take 20 mg by mouth daily. 01/04/20   [provider]  LUMIGAN 0.01 % SOLN Place 1 drop into both eyes at bedtime. 12/25/20   [provider]  Melatonin 1 MG TABS Take 10 mg by mouth once.    [provider]  montelukast (SINGULAIR) 10 MG tablet Take 10 mg by mouth daily. 09/13/21   [provider]  nortriptyline (PAMELOR) 10 MG capsule Take 10 mg by mouth at bedtime. 01/16/21   [provider]  omeprazole (PRILOSEC) 20 MG capsule Take 20 mg by mouth daily. 01/13/22 01/13/23  [provider]  polyethylene glycol powder (GLYCOLAX/MIRALAX) 17 GM/SCOOP powder Take by mouth. 03/29/20   [provider]  pregabalin (LYRICA) 25 MG capsule Take 25 mg by mouth 2 (two) times daily. Patient to increase    [provider]  Probiotic, Lactobacillus, CAPS Take by mouth.    [provider]  simvastatin (ZOCOR) 10 MG tablet Take 10 mg by mouth at bedtime.     [provider]  trimethoprim (TRIMPEX) 100 MG tablet Take 1 tablet (100 mg total) by mouth daily. 12/12/22 03/12/23  Carman Ching, PA-C    Family History Family History  Problem Relation Age of Onset   Breast cancer Mother 53   Breast cancer Maternal Uncle 60   Breast cancer Maternal Aunt        mat great aunt   Breast cancer Maternal Aunt 91   Tracheal cancer Father    Cancer Sister    Brain cancer Sister    Cancer  Brother     Social History Social History   Tobacco Use   Smoking status: Never   Smokeless tobacco: Never  Vaping Use   Vaping Use: Never used  Substance Use Topics   Alcohol use: No     Allergies   Macrobid [nitrofurantoin], Codeine, Hydrocodone, Oxycodone, Peanut-containing drug products, Peanuts [peanut oil], Penicillins, and Tramadol   Review of Systems Review of Systems  Musculoskeletal:        Right knee pain after fall      Physical Exam Triage Vital Signs ED Triage Vitals  Enc Vitals Group     BP 01/20/23 1049 116/82     Pulse Rate 01/20/23 1049 77     Resp 01/20/23 1049 20     Temp 01/20/23  1049 98 F (36.7 C)     Temp src --      SpO2 01/20/23 1049 100 %     Weight --      Height --      Head Circumference --      Peak Flow --      Pain Score 01/20/23 1046 8     Pain Loc --      Pain Edu? --      Excl. in GC? --    No data found.  Updated Vital Signs BP 116/82   Pulse 77   Temp 98 F (36.7 C)   Resp 20   SpO2 100%   Visual Acuity Right Eye Distance:   Left Eye Distance:   Bilateral Distance:    Right Eye Near:   Left Eye Near:    Bilateral Near:     Physical Exam Vitals and nursing note reviewed.  Constitutional:      General: She is not in acute distress.    Appearance: Normal appearance. She is not ill-appearing, toxic-appearing or diaphoretic.  HENT:     Head: Normocephalic and atraumatic.  Eyes:     Pupils: Pupils are equal, round, and reactive to light.  Cardiovascular:     Rate and Rhythm: Normal rate.  Pulmonary:     Effort: Pulmonary effort is normal.  Musculoskeletal:     Right knee: Swelling present. No deformity, effusion, erythema, ecchymosis, lacerations, bony tenderness or crepitus. Normal range of motion. No tenderness.     Comments: Minimal swelling of the right knee without ecchymosis or erythema.  Healed vertical surgical scar noted to mid knee.  Full range of motion knee without pain.  Negative valgus and  varus stress test.  Skin:    General: Skin is warm and dry.  Neurological:     General: No focal deficit present.     Mental Status: She is alert and oriented to person, place, and time.  Psychiatric:        Mood and Affect: Mood normal.        Behavior: Behavior normal.      UC Treatments / Results  Labs (all labs ordered are listed, but only abnormal results are displayed) Labs Reviewed  MAGNESIUM   Comprehensive Metabolic Panel (CMP) Order: [id number] Component Ref Range & Units 5 mo ago  Sodium 135 - 145 mmol/L 144  Potassium 3.5 - 5.0 mmol/L 4.3  Chloride 98 - 108 mmol/L 103  Carbon Dioxide (CO2) 21 - 30 mmol/L 26  Urea Nitrogen (BUN) 7 - 20 mg/dL 9  Creatinine 0.4 - 1.0 mg/dL 1.0  Glucose 70 - 409 mg/dL 811  Comment: Interpretive Data: Above is the NONFASTING reference range.  Below are the FASTING reference ranges: NORMAL:      70-99 mg/dL PREDIABETES: 914-782 mg/dL DIABETES:    > 956 mg/dL  Calcium 8.7 - 21.3 mg/dL 9.5  AST (Aspartate Aminotransferase) 15 - 41 U/L 23  ALT (Alanine Aminotransferase) 10 - 39 U/L 21  Bilirubin, Total 0.4 - 1.5 mg/dL 1.1  Alk Phos (Alkaline Phosphatase) 24 - 110 U/L 51  Albumin 3.5 - 4.8 g/dL 4.3  Protein, Total 6.2 - 8.1 g/dL 7.0  Anion Gap 3 - 12 mmol/L 15 High   BUN/CREA Ratio 6 - 27 9  Glomerular Filtration Rate (eGFR) mL/min/1.73sq m 55  Comment: CKD-EPI (2021) does not include patient's race in the calculation of eGFR. Monitoring changes of plasma creatinine and eGFR over time is useful for  monitoring kidney function.  This change was made on 11/20/2020.  Interpretive Ranges for eGFR(CKD-EPI 2021):  eGFR:              > 60 mL/min/1.73 sq m - Normal eGFR:              30 - 59 mL/min/1.73 sq m - Moderately Decreased eGFR:              15 - 29 mL/min/1.73 sq m - Severely Decreased eGFR:              < 15 mL/min/1.73 sq m -  Kidney Failure   Note: These eGFR calculations do not apply in acute  situations when eGFR is changing rapidly or in patients on dialysis.  Resulting Agency [facility]   Specimen Collected: 08/06/22 11:38   Performed by: [facility] Last Resulted: 08/06/22 19:24  Received From: [facility]  Result Received: 08/11/22 16:08    EKG   Radiology DG Knee Complete 4 Views Right  Result Date: 01/20/2023 CLINICAL DATA:  Right knee pain after fall. EXAM: RIGHT KNEE - COMPLETE 4+ VIEW COMPARISON:  Right knee x-rays dated November 30, 2013. FINDINGS: Prior right total knee arthroplasty. No evidence of hardware failure or loosening. No acute fracture or dislocation. Small joint effusion. Osteopenia. Soft tissues are unremarkable. IMPRESSION: 1. Small joint effusion. No acute osseous abnormality or hardware complication. Electronically Signed   By: Obie Dredge M.D.   On: 01/20/2023 11:28    Procedures Procedures (including critical care time)  Medications Ordered in UC Medications - No data to display  Initial Impression / Assessment and Plan / UC Course  I have reviewed the triage vital signs and the nursing notes.  Pertinent labs & imaging results that were available during my care of the patient were reviewed by me and considered in my medical decision making (see chart for details).     Reviewed exam and symptoms with patient.  Reviewed recent labs and progress notes. X-ray negative for fracture and hardware intact.  Discussed contusion and need RICE therapy.  Ace wrap applied in clinic Patient requested magnesium blood draw.  States she has been on magnesium supplementation for some time and has not had it checked in a while.  [facility] and [facility] told her she should probably have it checked.  Will do as courtesy advised patient any abnormal results will need to be addressed by her PCP and she verbalized understanding Follow-up with PCP in 2 days for recheck Please go to the ER  for any worsening symptoms Final Clinical Impressions(s) / UC Diagnoses   Final diagnoses:  Contusion of right knee, initial encounter  Magnesium deficiency   Discharge Instructions   None    ED Prescriptions   None    PDMP not reviewed this encounter.   Radford Pax, NP 01/20/23 1145

## 2023-01-20 NOTE — Discharge Instructions (Signed)
Your knee x-ray was negative.  Please elevate and ice the knee as needed.  Ace wrap was applied to the knee as needed for compression and support.  You may take this on and off as needed You may take over-the-counter Tylenol or ibuprofen as needed For your request we drew your magnesium levels.  Will contact you if those results are abnormal.  Please note any abnormal results will need to be addressed by your PCP Please go to the ER for any worsening symptoms Please follow-up with your PCP in 2 days for recheck

## 2023-02-04 ENCOUNTER — Encounter: Payer: Self-pay | Admitting: Student in an Organized Health Care Education/Training Program

## 2023-02-04 ENCOUNTER — Ambulatory Visit
Payer: PPO | Attending: Student in an Organized Health Care Education/Training Program | Admitting: Student in an Organized Health Care Education/Training Program

## 2023-02-04 VITALS — BP 114/83 | HR 95 | Temp 97.4°F | Resp 18 | Ht 68.0 in | Wt 165.0 lb

## 2023-02-04 DIAGNOSIS — G894 Chronic pain syndrome: Secondary | ICD-10-CM | POA: Diagnosis not present

## 2023-02-04 DIAGNOSIS — M47816 Spondylosis without myelopathy or radiculopathy, lumbar region: Secondary | ICD-10-CM | POA: Insufficient documentation

## 2023-02-04 NOTE — Progress Notes (Signed)
PROVIDER NOTE: Information contained herein reflects review and annotations entered in association with encounter. Interpretation of such information and data should be left to medically-trained personnel. Information provided to patient can be located elsewhere in the medical record under "Patient Instructions". Document created using STT-dictation technology, any transcriptional errors that may result from process are unintentional.    Patient: Sarah [name]  Service Category: E/M  Provider: Edward Jolly, MD  DOB: 28-Mar-1932  DOS: 02/04/2023  Referring Provider: Rayetta Humphrey, MD  MRN: [id number]  Specialty: Interventional Pain Management  PCP: Rayetta Humphrey, MD  Type: Established Patient  Setting: Ambulatory outpatient    Location: Office  Delivery: Face-to-face     HPI  Sarah [name], a 89 y.o. year old female, is here today because of her Lumbar facet arthropathy [M47.816]. Sarah [name] primary complain today is Back Pain (Low and equal on both sides.)  Pertinent problems: Sarah [name] has Spondylosis of lumbar joint; Lumbar radiculopathy; Lumbar disc herniation (L2/L3); Lumbar degenerative disc disease; Chronic pain syndrome; Neuropathy; Thyroid nodule; and Bilateral occipital neuralgia on their pertinent problem list. Pain Assessment: Severity of Chronic pain is reported as a 10-Worst pain ever (at its worst)/10. Location: Back Right, Left, Lower/denies. Onset: More than a month ago. Quality: Aching, Sharp. Timing: Intermittent. Modifying factor(s): rest, procedures. Vitals:  height is 5\' 8"  (1.727 m) and weight is 165 lb (74.8 kg). Her temporal temperature is 97.4 F (36.3 C) (abnormal). Her blood pressure is 114/83 and her pulse is 95. Her respiration is 18 and oxygen saturation is 97%.  BMI: Estimated body mass index is 25.09 kg/m as calculated from the following:   Height as of this encounter: 5\' 8"  (1.727 m).   Weight as of this encounter: 165 lb (74.8  kg). Last encounter: 12/04/2022. Last procedure: 01/05/2023.  Reason for encounter: post-procedure evaluation and assessment.    Post-procedure evaluation   Procedure: Lumbar Facet, Medial Branch Block(s) #1  Laterality: Bilateral  Level: L3, L4, and L5 Medial Branch Level(s). Injecting these levels blocks the L3-4 and L4-5 lumbar facet joints. Imaging: Fluoroscopic guidance         Anesthesia: Local anesthesia (1-2% Lidocaine) DOS: 01/05/2023 Performed by: Edward Jolly, MD  Primary Purpose: Diagnostic/Therapeutic Indications: Low back pain severe enough to impact quality of life or function. 1. Lumbar facet arthropathy (L4/5, L5/S1)   2. Spondylosis of lumbar joint   3. Chronic pain syndrome    NAS-11 Pain score:   Pre-procedure: 0-No pain/10   Post-procedure: 0-No pain/10      Effectiveness:  Initial hour after procedure: 100 %  Subsequent 4-6 hours post-procedure: 100 %  Analgesia past initial 6 hours: 100 % (for about 2 weeks the abruptly returned.)  Ongoing improvement:  Analgesic:  <20% Function: Back to baseline ROM: Back to baseline   ROS  Constitutional: Denies any fever or chills Gastrointestinal: No reported hemesis, hematochezia, vomiting, or acute GI distress Musculoskeletal:  +LBP Neurological: No reported episodes of acute onset apraxia, aphasia, dysarthria, agnosia, amnesia, paralysis, loss of coordination, or loss of consciousness  Medication Review  DULoxetine HCl, Probiotic (Lactobacillus), acetaminophen, alendronate, beta carotene w/minerals, bimatoprost, dicyclomine, donepezil, estradiol, fexofenadine, gabapentin, latanoprost, levothyroxine, lovastatin, melatonin, montelukast, nortriptyline, omeprazole, polyethylene glycol powder, pregabalin, simvastatin, and trimethoprim  History Review  Allergy: Sarah [name] is allergic to macrobid [nitrofurantoin], codeine, hydrocodone, oxycodone, peanut-containing drug products, peanuts [peanut oil], penicillins,  and tramadol. Drug: Sarah [name]  has no history on file for drug use. Alcohol:  reports no history  of alcohol use. Tobacco:  reports that she has never smoked. She has never used smokeless tobacco. Social: Sarah [name]  reports that she has never smoked. She has never used smokeless tobacco. She reports that she does not drink alcohol. Medical:  has a past medical history of Benign essential HTN (09/21/2015), Cancer (HCC), Seizures (HCC), and Thyroid disease. Surgical: Sarah [name]  has a past surgical history that includes Abdominal hysterectomy; Breast biopsy (Bilateral); Breast excisional biopsy (Right); and Knee arthroscopy w/ lateral retinacular repair. Family: family history includes Brain cancer in her sister; Breast cancer in her maternal aunt; Breast cancer (age of onset: 43) in her maternal aunt; Breast cancer (age of onset: 48) in her mother; Breast cancer (age of onset: 49) in her maternal uncle; Cancer in her brother and sister; Tracheal cancer in her father.  Laboratory Chemistry Profile   Renal Lab Results  Component Value Date   BUN 23 (H) 09/14/2015   CREATININE 1.20 (H) 12/26/2022   GFRAA 77 09/12/2020   GFRNONAA 67 09/12/2020    Hepatic Lab Results  Component Value Date   AST 19 11/26/2015   ALT 15 11/26/2015   ALBUMIN 4.7 11/26/2015   ALKPHOS 64 11/26/2015    Electrolytes Lab Results  Component Value Date   NA 141 09/14/2015   K 3.5 09/14/2015   CL 107 09/14/2015   CALCIUM 9.7 09/14/2015   MG 2.3 01/20/2023    Bone No results found for: "VD25OH", "VD125OH2TOT", "YQ6578IO9", "GE9528UX3", "25OHVITD1", "25OHVITD2", "25OHVITD3", "TESTOFREE", "TESTOSTERONE"  Inflammation (CRP: Acute Phase) (ESR: Chronic Phase) No results found for: "CRP", "ESRSEDRATE", "LATICACIDVEN"       Note: Above Lab results reviewed.  Recent Imaging Review  DG Knee Complete 4 Views Right CLINICAL DATA:  Right knee pain after fall.  EXAM: RIGHT KNEE - COMPLETE 4+ VIEW  COMPARISON:   Right knee x-rays dated November 30, 2013.  FINDINGS: Prior right total knee arthroplasty. No evidence of hardware failure or loosening. No acute fracture or dislocation. Small joint effusion. Osteopenia. Soft tissues are unremarkable.  IMPRESSION: 1. Small joint effusion. No acute osseous abnormality or hardware complication.  Electronically Signed   By: Obie Dredge M.D.   On: 01/20/2023 11:28 Note: Reviewed        Physical Exam  General appearance: Well nourished, well developed, and well hydrated. In no apparent acute distress Mental status: Alert, oriented x 3 (person, place, & time)       Respiratory: No evidence of acute respiratory distress Eyes: PERLA Vitals: BP 114/83   Pulse 95   Temp (!) 97.4 F (36.3 C) (Temporal)   Resp 18   Ht 5\' 8"  (1.727 m)   Wt 165 lb (74.8 kg)   SpO2 97%   BMI 25.09 kg/m  BMI: Estimated body mass index is 25.09 kg/m as calculated from the following:   Height as of this encounter: 5\' 8"  (1.727 m).   Weight as of this encounter: 165 lb (74.8 kg). Ideal: Ideal body weight: 63.9 kg (140 lb 14 oz) Adjusted ideal body weight: 68.3 kg (150 lb 8.4 oz)  Lumbar Spine Area Exam  Skin & Axial Inspection: No masses, redness, or swelling Alignment: Symmetrical Functional ROM: Pain restricted ROM affecting both sides Stability: No instability detected Muscle Tone/Strength: Functionally intact. No obvious neuro-muscular anomalies detected. Sensory (Neurological): Musculoskeletal pain pattern Palpation: Complains of area being tender to palpation       Provocative Tests: Hyperextension/rotation test: (+) bilaterally for facet joint pain. Lumbar quadrant test (Kemp's test): (+)  bilaterally for facet joint pain.   Gait & Posture Assessment  Ambulation: Unassisted Gait: Relatively normal for age and body habitus Posture: WNL  Lower Extremity Exam      Side: Right lower extremity   Side: Left lower extremity  Stability: No instability observed            Stability: No instability observed          Skin & Extremity Inspection: Skin color, temperature, and hair growth are WNL. No peripheral edema or cyanosis. No masses, redness, swelling, asymmetry, or associated skin lesions. No contractures.   Skin & Extremity Inspection: Skin color, temperature, and hair growth are WNL. No peripheral edema or cyanosis. No masses, redness, swelling, asymmetry, or associated skin lesions. No contractures.  Functional ROM: Unrestricted ROM                   Functional ROM: Unrestricted ROM                  Muscle Tone/Strength: Functionally intact. No obvious neuro-muscular anomalies detected.   Muscle Tone/Strength: Functionally intact. No obvious neuro-muscular anomalies detected.  Sensory (Neurological): Unimpaired         Sensory (Neurological): Unimpaired        DTR: Patellar: deferred today Achilles: deferred today Plantar: deferred today   DTR: Patellar: deferred today Achilles: deferred today Plantar: deferred today  Palpation: No palpable anomalies   Palpation: No palpable anomalies       Assessment   Diagnosis Status  1. Lumbar facet arthropathy (L4/5, L5/S1)   2. Spondylosis of lumbar joint   3. Chronic pain syndrome    Responding Responding Controlled    Plan of Care  Positive response to first set of diagnostic lumbar facet medial branch nerve blocks with 100% pain relief noted for approximately 2 weeks.  Patient was very pleased with the results and would like to repeat diagnostic nerve block #2 and then consider lumbar radiofrequency ablation. Orders:  Orders Placed This Encounter  Procedures   LUMBAR FACET(MEDIAL BRANCH NERVE BLOCK) MBNB    Standing Status:   Future    Standing Expiration Date:   05/07/2023    Scheduling Instructions:     Procedure: Lumbar facet block (AKA.: Lumbosacral medial branch nerve block)     Side: Bilateral     Level: L3-4, L4-5, Facets (L3, L4, L5,  Medial Branch)     Sedation: without      Timeframe: ASAA    Order Specific Question:   Where will this procedure be performed?    Answer:   [facility]   Follow-up plan:   Return in about 3 weeks (around 02/25/2023) for B/L L3, 4, 5 MBNB #2, in clinic [facility].      Recent Visits Date Type Provider Dept  01/05/23 Procedure visit Edward Jolly, MD [facility]  12/04/22 Office Visit Edward Jolly, MD [facility]  Showing recent visits within past 90 days and meeting all other requirements Today's Visits Date Type Provider Dept  02/04/23 Office Visit Edward Jolly, MD [facility]  Showing today's visits and meeting all other requirements Future Appointments Date Type Provider Dept  02/25/23 Appointment Edward Jolly, MD [facility]  Showing future appointments within next 90 days and meeting all other requirements  I discussed the assessment and treatment plan with the patient. The patient was provided an opportunity to ask questions and all were answered. The patient agreed with the plan and demonstrated an understanding  of the instructions.  Patient advised to call back or seek an in-person evaluation if the symptoms or condition worsens.  Duration of encounter: .  Total time on encounter, as per AMA guidelines included both the face-to-face and non-face-to-face time personally spent by the physician and/or other qualified health care professional(s) on the day of the encounter (includes time in activities that require the physician or other qualified health care professional and does not include time in activities normally performed by clinical staff). Physician's time may include the following activities when performed: Preparing to see the patient (e.g., pre-charting review of records, searching for previously ordered imaging, lab work, and nerve conduction tests) Review of prior analgesic pharmacotherapies. Reviewing PMP Interpreting ordered tests (e.g., lab work, imaging,  nerve conduction tests) Performing post-procedure evaluations, including interpretation of diagnostic procedures Obtaining and/or reviewing separately obtained history Performing a medically appropriate examination and/or evaluation Counseling and educating the patient/family/caregiver Ordering medications, tests, or procedures Referring and communicating with other health care professionals (when not separately reported) Documenting clinical information in the electronic or other health record Independently interpreting results (not separately reported) and communicating results to the patient/ family/caregiver Care coordination (not separately reported)  Note by: Edward Jolly, MD Date: 02/04/2023; Time: 3:32 PM

## 2023-02-04 NOTE — Patient Instructions (Addendum)
______________________________________________________________________  Preparing for your procedure  Appointments: If you think you may not be able to keep your appointment, call 24-48 hours in advance to cancel. We need time to make it available to others.  During your procedure appointment there will be: No Prescription Refills. No disability issues to discussed. No medication changes or discussions.  Instructions: Food intake: Avoid eating anything solid for at least 6 hours prior to your procedure. Clear liquid intake: You may take clear liquids such as water up to 2 hours prior to your procedure. (No carbonated drinks. No soda.) Transportation: Unless otherwise stated by your physician, bring a driver. Morning Medicines: Except for blood thinners, take all of your other morning medications with a sip of water. Make sure to take your heart and blood pressure medicines. If your blood pressure's lower number is above 100, the case will be rescheduled. Blood thinners: Make sure to stop your blood thinners as instructed.  If you take a blood thinner, but were not instructed to stop it, call our office [phone] and ask to talk to a nurse. Not stopping a blood thinner prior to certain procedures could lead to serious complications. Diabetics on insulin: Notify the staff so that you can be scheduled 1st case in the morning. If your diabetes requires high dose insulin, take only  of your normal insulin dose the morning of the procedure and notify the staff that you have done so. Preventing infections: Shower with an antibacterial soap the morning of your procedure.  Build-up your immune system: Take 1000 mg of Vitamin C with every meal (3 times a day) the day prior to your procedure. Antibiotics: Inform the nursing staff if you are taking any antibiotics or if you have any conditions that may require antibiotics prior to procedures. (Example: recent joint implants)   Pregnancy: If you are  pregnant make sure to notify the nursing staff. Not doing so may result in injury to the fetus, including death.  Sickness: If you have a cold, fever, or any active infections, call and cancel or reschedule your procedure. Receiving steroids while having an infection may result in complications. Arrival: You must be in the facility at least 30 minutes prior to your scheduled procedure. Tardiness: Your scheduled time is also the cutoff time. If you do not arrive at least 15 minutes prior to your procedure, you will be rescheduled.  Children: Do not bring any children with you. Make arrangements to keep them home. Dress appropriately: There is always a possibility that your clothing may get soiled. Avoid long dresses. Valuables: Do not bring any jewelry or valuables.  Reasons to call and reschedule or cancel your procedure: (Following these recommendations will minimize the risk of a serious complication.) Surgeries: Avoid having procedures within 2 weeks of any surgery. (Avoid for 2 weeks before or after any surgery). Flu Shots: Avoid having procedures within 2 weeks of a flu shots or . (Avoid for 2 weeks before or after immunizations). Barium: Avoid having a procedure within 7-10 days after having had a radiological study involving the use of radiological contrast. (Myelograms, Barium swallow or enema study). Heart attacks: Avoid any elective procedures or surgeries for the initial 6 months after a "Myocardial Infarction" (Heart Attack). Blood thinners: It is imperative that you stop these medications before procedures. Let us know if you if you take any blood thinner.  Infection: Avoid procedures during or within two weeks of an infection (including chest colds or gastrointestinal problems). Symptoms associated with infections  include: Localized redness, fever, chills, night sweats or profuse sweating, burning sensation when voiding, cough, congestion, stuffiness, runny nose, sore throat, diarrhea,  nausea, vomiting, cold or Flu symptoms, recent or current infections. It is specially important if the infection is over the area that we intend to treat. Heart and lung problems: Symptoms that may suggest an active cardiopulmonary problem include: cough, chest pain, breathing difficulties or shortness of breath, dizziness, ankle swelling, uncontrolled high or unusually low blood pressure, and/or palpitations. If you are experiencing any of these symptoms, cancel your procedure and contact your primary care physician for an evaluation.  Remember:  Regular Business hours are:  Monday to Thursday 8:00 AM to 4:00 PM  Provider's Schedule: Francisco Naveira, MD:  Procedure days: Tuesday and Thursday 7:30 AM to 4:00 PM  Bilal Lateef, MD:  Procedure days: Monday and Wednesday 7:30 AM to 4:00 PM  ______________________________________________________________________    

## 2023-02-06 DIAGNOSIS — I517 Cardiomegaly: Secondary | ICD-10-CM | POA: Diagnosis not present

## 2023-02-06 DIAGNOSIS — R0602 Shortness of breath: Secondary | ICD-10-CM | POA: Diagnosis not present

## 2023-02-06 DIAGNOSIS — I340 Nonrheumatic mitral (valve) insufficiency: Secondary | ICD-10-CM | POA: Diagnosis not present

## 2023-02-06 DIAGNOSIS — I071 Rheumatic tricuspid insufficiency: Secondary | ICD-10-CM | POA: Diagnosis not present

## 2023-02-06 DIAGNOSIS — E785 Hyperlipidemia, unspecified: Secondary | ICD-10-CM | POA: Diagnosis not present

## 2023-02-06 DIAGNOSIS — R9431 Abnormal electrocardiogram [ECG] [EKG]: Secondary | ICD-10-CM | POA: Diagnosis not present

## 2023-02-06 DIAGNOSIS — G4733 Obstructive sleep apnea (adult) (pediatric): Secondary | ICD-10-CM | POA: Diagnosis not present

## 2023-02-06 DIAGNOSIS — R7309 Other abnormal glucose: Secondary | ICD-10-CM | POA: Diagnosis not present

## 2023-02-07 ENCOUNTER — Other Ambulatory Visit: Payer: Self-pay | Admitting: Physician Assistant

## 2023-02-19 DIAGNOSIS — R0602 Shortness of breath: Secondary | ICD-10-CM | POA: Diagnosis not present

## 2023-02-25 ENCOUNTER — Encounter: Payer: Self-pay | Admitting: Student in an Organized Health Care Education/Training Program

## 2023-02-25 ENCOUNTER — Ambulatory Visit
Admission: RE | Admit: 2023-02-25 | Discharge: 2023-02-25 | Disposition: A | Discharge status: Home / Self Care | Payer: PPO | Source: Ambulatory Visit | Attending: Student in an Organized Health Care Education/Training Program | Admitting: Student in an Organized Health Care Education/Training Program

## 2023-02-25 ENCOUNTER — Ambulatory Visit
Payer: PPO | Attending: Student in an Organized Health Care Education/Training Program | Admitting: Student in an Organized Health Care Education/Training Program

## 2023-02-25 VITALS — BP 151/98 | HR 128 | Temp 97.1°F | Resp 17 | Ht 68.0 in | Wt 161.0 lb

## 2023-02-25 DIAGNOSIS — G894 Chronic pain syndrome: Secondary | ICD-10-CM | POA: Insufficient documentation

## 2023-02-25 DIAGNOSIS — M47816 Spondylosis without myelopathy or radiculopathy, lumbar region: Secondary | ICD-10-CM

## 2023-02-25 MED ORDER — ROPIVACAINE HCL 2 MG/ML IJ SOLN
9.0000 mL | Freq: Once | INTRAMUSCULAR | Status: AC
  Administered 2023-02-25: 9 mL via PERINEURAL
  Filled 2023-02-25: qty 20

## 2023-02-25 MED ORDER — DEXAMETHASONE SODIUM PHOSPHATE 10 MG/ML IJ SOLN
10.0000 mg | Freq: Once | INTRAMUSCULAR | Status: AC
  Administered 2023-02-25: 10 mg
  Filled 2023-02-25: qty 1

## 2023-02-25 MED ORDER — LIDOCAINE HCL 2 % IJ SOLN
20.0000 mL | Freq: Once | INTRAMUSCULAR | Status: AC
  Administered 2023-02-25: 400 mg
  Filled 2023-02-25: qty 40

## 2023-02-25 NOTE — Patient Instructions (Signed)
Radiofrequency Ablation Radiofrequency ablation is a procedure that is performed to relieve pain. The procedure is often used for back, neck, or arm pain. Radiofrequency ablation involves the use of a machine that creates radio waves to make heat. During the procedure, the heat is applied to the nerve that carries the pain signal. The heat damages the nerve and interferes with the pain signal. Pain relief usually starts about 2 weeks after the procedure and lasts for 6 months to 1 year. Tell a health care provider about: Any allergies you have. All medicines you are taking, including vitamins, herbs, eye drops, creams, and over-the-counter medicines. Any problems you or family members have had with anesthetic medicines. Any bleeding problems you have. Any surgeries you have had. Any medical conditions you have. Whether you are pregnant or may be pregnant. What are the risks? Generally, this is a safe procedure. However, problems may occur, including: Pain or soreness at the injection site. Allergic reaction to medicines given during the procedure. Bleeding. Infection at the injection site. Damage to nerves or blood vessels. What happens before the procedure? When to stop eating and drinking Follow instructions from your health care provider about what you may eat and drink before your procedure. These may include: 8 hours before the procedure Stop eating most foods. Do not eat meat, fried foods, or fatty foods. Eat only light foods, such as toast or crackers. All liquids are okay except energy drinks and alcohol. 6 hours before the procedure Stop eating. Drink only clear liquids, such as water, clear fruit juice, black coffee, plain tea, and sports drinks. Do not drink energy drinks or alcohol. 2 hours before the procedure Stop drinking all liquids. You may be allowed to take medicine with small sips of water. If you do not follow your health care provider's instructions, your  procedure may be delayed or canceled. Medicines Ask your health care provider about: Changing or stopping your regular medicines. This is especially important if you are taking diabetes medicines or blood thinners. Taking medicines such as aspirin and ibuprofen. These medicines can thin your blood. Do not take these medicines unless your health care provider tells you to take them. Taking over-the-counter medicines, vitamins, herbs, and supplements. General instructions Ask your health care provider what steps will be taken to help prevent infection. These steps may include: Removing hair at the procedure site. Washing skin with a germ-killing soap. Taking antibiotic medicine. If you will be going home right after the procedure, plan to have a responsible adult: Take you home from the hospital or clinic. You will not be allowed to drive. Care for you for the time you are told. What happens during the procedure?  You will be awake during the procedure. You will need to be able to talk with the health care provider during the procedure. An IV will be inserted into one of your veins. You will be given one or more of the following: A medicine to help you relax (sedative). A medicine to numb the area (local anesthetic). Your health care provider will insert a radiofrequency needle into the area to be treated. This is done with the help of fluoroscopy. A wire that carries the radio waves (electrode) will be put through the radiofrequency needle. An electrical pulse will be sent through the electrode to verify the correct nerve that is causing your pain. You will feel a tingling sensation, and you may have muscle twitching. The tissue around the needle tip will be heated by an  electric current that comes from the radiofrequency machine. This will numb the nerves. The needle will be removed. A bandage (dressing) will be put on the insertion area. The procedure may vary among health care providers  and hospitals. What happens after the procedure? Your blood pressure, heart rate, breathing rate, and blood oxygen level will be monitored until you leave the hospital or clinic. Return to your normal activities as told by your health care provider. Ask your health care provider what activities are safe for you. If you were given a sedative during the procedure, it can affect you for several hours. Do not drive or operate machinery until your health care provider says that it is safe. Summary Radiofrequency ablation is a procedure that is performed to relieve pain. The procedure is often used for back, neck, or arm pain. Radiofrequency ablation involves the use of a machine that creates radio waves to make heat. Plan to have a responsible adult take you home from the hospital or clinic. Do not drive or operate machinery until your health care provider says that it is safe. Return to your normal activities as told by your health care provider. Ask your health care provider what activities are safe for you. This information is not intended to replace advice given to you by your health care provider. Make sure you discuss any questions you have with your health care provider. Document Revised: 02/26/2021 Document Reviewed: 02/26/2021 [employer] Patient Education  2024 [employer] Pain Management Discharge Instructions  General Discharge Instructions :  If you need to reach your doctor call: Monday-Friday 8:00 am - 4:00 pm at [phone] or toll free [phone].  After clinic hours [phone] to have operator reach doctor.  Bring all of your medication bottles to all your appointments in the pain clinic.  To cancel or reschedule your appointment with Pain Management please remember to call 24 hours in advance to avoid a fee.  Refer to the educational materials which you have been given on: General Risks, I had my Procedure. Discharge Instructions, Post Sedation.  Post Procedure  Instructions:  The drugs you were given will stay in your system until tomorrow, so for the next 24 hours you should not drive, make any legal decisions or drink any alcoholic beverages.  You may eat anything you prefer, but it is better to start with liquids then soups and crackers, and gradually work up to solid foods.  Please notify your doctor immediately if you have any unusual bleeding, trouble breathing or pain that is not related to your normal pain.  Depending on the type of procedure that was done, some parts of your body may feel week and/or numb.  This usually clears up by tonight or the next day.  Walk with the use of an assistive device or accompanied by an adult for the 24 hours.  You may use ice on the affected area for the first 24 hours.  Put ice in a Ziploc bag and cover with a towel and place against area 15 minutes on 15 minutes off.  You may switch to heat after 24 hours.Facet Blocks Patient Information  Description: The facets are joints in the spine between the vertebrae.  Like any joints in the body, facets can become irritated and painful.  Arthritis can also effect the facets.  By injecting steroids and local anesthetic in and around these joints, we can temporarily block the nerve supply to them.  Steroids act directly on irritated nerves and tissues to reduce  selling and inflammation which often leads to decreased pain.  Facet blocks may be done anywhere along the spine from the neck to the low back depending upon the location of your pain.   After numbing the skin with local anesthetic (like Novocaine), a small needle is passed onto the facet joints under x-ray guidance.  You may experience a sensation of pressure while this is being done.  The entire block usually lasts about 15-25 minutes.   Conditions which may be treated by facet blocks:  Low back/buttock pain Neck/shoulder pain Certain types of headaches  Preparation for the injection:  Do not eat any  solid food or dairy products within 8 hours of your appointment. You may drink clear liquid up to 3 hours before appointment.  Clear liquids include water, black coffee, juice or soda.  No milk or cream please. You may take your regular medication, including pain medications, with a sip of water before your appointment.  Diabetics should hold regular insulin (if taken separately) and take 1/2 normal NPH dose the morning of the procedure.  Carry some sugar containing items with you to your appointment. A driver must accompany you and be prepared to drive you home after your procedure. Bring all your current medications with you. An IV may be inserted and sedation may be given at the discretion of the physician. A blood pressure cuff, EKG and other monitors will often be applied during the procedure.  Some patients may need to have extra oxygen administered for a short period. You will be asked to provide medical information, including your allergies and medications, prior to the procedure.  We must know immediately if you are taking blood thinners (like Coumadin/Warfarin) or if you are allergic to IV iodine contrast (dye).  We must know if you could possible be pregnant.  Possible side-effects:  Bleeding from needle site Infection (rare, may require surgery) Nerve injury (rare) Numbness & tingling (temporary) Difficulty urinating (rare, temporary) Spinal headache (a headache worse with upright posture) Light-headedness (temporary) Pain at injection site (serveral days) Decreased blood pressure (rare, temporary) Weakness in arm/leg (temporary) Pressure sensation in back/neck (temporary)   Call if you experience:  Fever/chills associated with headache or increased back/neck pain Headache worsened by an upright position New onset, weakness or numbness of an extremity below the injection site Hives or difficulty breathing (go to the emergency room) Inflammation or drainage at the injection  site(s) Severe back/neck pain greater than usual New symptoms which are concerning to you  Please note:  Although the local anesthetic injected can often make your back or neck feel good for several hours after the injection, the pain will likely return. It takes 3-7 days for steroids to work.  You may not notice any pain relief for at least one week.  If effective, we will often do a series of 2-3 injections spaced 3-6 weeks apart to maximally decrease your pain.  After the initial series, you may be a candidate for a more permanent nerve block of the facets.  If you have any questions, please call #[phone]) [phone] [facility]

## 2023-02-25 NOTE — Progress Notes (Signed)
Safety precautions to be maintained throughout the outpatient stay will include: orient to surroundings, keep bed in low position, maintain call bell within reach at all times, provide assistance with transfer out of bed and ambulation.  

## 2023-02-25 NOTE — Progress Notes (Signed)
PROVIDER NOTE: Interpretation of information contained herein should be left to medically-trained personnel. Specific patient instructions are provided elsewhere under "Patient Instructions" section of medical record. This document was created in part using STT-dictation technology, any transcriptional errors that may result from this process are unintentional.  Patient: Sarah [name] Type: Established DOB: [birth year] MRN: [id number] PCP: Rayetta Humphrey, MD  Service: Procedure DOS: 02/25/2023 Setting: Ambulatory Location: Ambulatory outpatient facility Delivery: Face-to-face Provider: Edward Jolly, MD Specialty: Interventional Pain Management Specialty designation: 09 Location: Outpatient facility Ref. Prov.: Rayetta Humphrey, MD       Interventional Therapy   Procedure: Lumbar Facet, Medial Branch Block(s) #2  Laterality: Bilateral  Level: L3, L4, and L5 Medial Branch Level(s). Injecting these levels blocks the L3-4 and L4-5 lumbar facet joints. Imaging: Fluoroscopic guidance         Anesthesia: Local anesthesia (1-2% Lidocaine) DOS: 02/25/2023 Performed by: Edward Jolly, MD  Primary Purpose: Diagnostic/Therapeutic Indications: Low back pain severe enough to impact quality of life or function. 1. Lumbar facet arthropathy (L4/5, L5/S1)   2. Spondylosis of lumbar joint   3. Chronic pain syndrome    NAS-11 Pain score:   Pre-procedure: 0-No pain/10   Post-procedure: 0-No pain/10     Position / Prep / Materials:  Position: Prone  Prep solution: DuraPrep (Iodine Povacrylex [0.7% available iodine] and Isopropyl Alcohol, 74% w/w) Area Prepped: Posterolateral Lumbosacral Spine (Wide prep: From the lower border of the scapula down to the end of the tailbone and from flank to flank.)  Materials:  Tray: Block Needle(s):  Type: Spinal  Gauge (G): 22  Length: 3.5-in Qty: 2      Pre-op H&P Assessment:  Sarah [name] is a 89 y.o. (year old), female patient, seen today for  interventional treatment. She  has a past surgical history that includes Abdominal hysterectomy; Breast biopsy (Bilateral); Breast excisional biopsy (Right); and Knee arthroscopy w/ lateral retinacular repair. Sarah [name] has a current medication list which includes the following prescription(s): acetaminophen, alendronate, beta carotene w/minerals, bimatoprost, dicyclomine, donepezil, duloxetine hcl, estradiol, fexofenadine, levothyroxine, lovastatin, melatonin, omeprazole, polyethylene glycol powder, prednisone, pregabalin, probiotic (lactobacillus), simvastatin, trimethoprim, gabapentin, latanoprost, lumigan, montelukast, and nortriptyline. Her primarily concern today is the Back Pain (Lumbar bilateral )  Initial Vital Signs:  Pulse/HCG Rate: (!) 128ECG Heart Rate: 64 Temp: (!) 97.1 F (36.2 C) Resp: 16 BP: 139/81 SpO2: 95 %  BMI: Estimated body mass index is 24.48 kg/m as calculated from the following:   Height as of this encounter: 5\' 8"  (1.727 m).   Weight as of this encounter: 161 lb (73 kg).  Risk Assessment: Allergies: Reviewed. She is allergic to macrobid [nitrofurantoin], codeine, hydrocodone, oxycodone, peanut-containing drug products, peanuts [peanut oil], penicillins, and tramadol.  Allergy Precautions: None required Coagulopathies: Reviewed. None identified.  Blood-thinner therapy: None at this time Active Infection(s): Reviewed. None identified. Sarah [name] is afebrile  Site Confirmation: Sarah [name] was asked to confirm the procedure and laterality before marking the site Procedure checklist: Completed Consent: Before the procedure and under the influence of no sedative(s), amnesic(s), or anxiolytics, the patient was informed of the treatment options, risks and possible complications. To fulfill our ethical and legal obligations, as recommended by the American Medical Association's Code of Ethics, I have informed the patient of my clinical impression; the nature and purpose of  the treatment or procedure; the risks, benefits, and possible complications of the intervention; the alternatives, including doing nothing; the risk(s) and benefit(s) of the alternative treatment(s) or procedure(s); and  the risk(s) and benefit(s) of doing nothing. The patient was provided information about the general risks and possible complications associated with the procedure. These may include, but are not limited to: failure to achieve desired goals, infection, bleeding, organ or nerve damage, allergic reactions, paralysis, and death. In addition, the patient was informed of those risks and complications associated to Spine-related procedures, such as failure to decrease pain; infection (i.e.: Meningitis, epidural or intraspinal abscess); bleeding (i.e.: epidural hematoma, subarachnoid hemorrhage, or any other type of intraspinal or peri-dural bleeding); organ or nerve damage (i.e.: Any type of peripheral nerve, nerve root, or spinal cord injury) with subsequent damage to sensory, motor, and/or autonomic systems, resulting in permanent pain, numbness, and/or weakness of one or several areas of the body; allergic reactions; (i.e.: anaphylactic reaction); and/or death. Furthermore, the patient was informed of those risks and complications associated with the medications. These include, but are not limited to: allergic reactions (i.e.: anaphylactic or anaphylactoid reaction(s)); adrenal axis suppression; blood sugar elevation that in diabetics may result in ketoacidosis or comma; water retention that in patients with history of congestive heart failure may result in shortness of breath, pulmonary edema, and decompensation with resultant heart failure; weight gain; swelling or edema; medication-induced neural toxicity; particulate matter embolism and blood vessel occlusion with resultant organ, and/or nervous system infarction; and/or aseptic necrosis of one or more joints. Finally, the patient was informed  that Medicine is not an exact science; therefore, there is also the possibility of unforeseen or unpredictable risks and/or possible complications that may result in a catastrophic outcome. The patient indicated having understood very clearly. We have given the patient no guarantees and we have made no promises. Enough time was given to the patient to ask questions, all of which were answered to the patient's satisfaction. Sarah [name] has indicated that she wanted to continue with the procedure. Attestation: I, the ordering provider, attest that I have discussed with the patient the benefits, risks, side-effects, alternatives, likelihood of achieving goals, and potential problems during recovery for the procedure that I have provided informed consent. Date  Time: 02/25/2023  8:49 AM   Pre-Procedure Preparation:  Monitoring: As per clinic protocol. Respiration, ETCO2, SpO2, BP, heart rate and rhythm monitor placed and checked for adequate function Safety Precautions: Patient was assessed for positional comfort and pressure points before starting the procedure. Time-out: I initiated and conducted the "Time-out" before starting the procedure, as per protocol. The patient was asked to participate by confirming the accuracy of the "Time Out" information. Verification of the correct person, site, and procedure were performed and confirmed by me, the nursing staff, and the patient. "Time-out" conducted as per Joint Commission's Universal Protocol (UP.01.01.01). Time: 0934 Start Time: 0934 hrs.  Description of Procedure:          Laterality: (see above) Targeted Levels: (see above)  Safety Precautions: Aspiration looking for blood return was conducted prior to all injections. At no point did we inject any substances, as a needle was being advanced. Before injecting, the patient was told to immediately notify me if she was experiencing any new onset of "ringing in the ears, or metallic taste in the mouth". No  attempts were made at seeking any paresthesias. Safe injection practices and needle disposal techniques used. Medications properly checked for expiration dates. SDV (single dose vial) medications used. After the completion of the procedure, all disposable equipment used was discarded in the proper designated medical waste containers. Local Anesthesia: Protocol guidelines were followed. The patient was  positioned over the fluoroscopy table. The area was prepped in the usual manner. The time-out was completed. The target area was identified using fluoroscopy. A 12-in long, straight, sterile hemostat was used with fluoroscopic guidance to locate the targets for each level blocked. Once located, the skin was marked with an approved surgical skin marker. Once all sites were marked, the skin (epidermis, dermis, and hypodermis), as well as deeper tissues (fat, connective tissue and muscle) were infiltrated with a small amount of a short-acting local anesthetic, loaded on a 10cc syringe with a 25G, 1.5-in  Needle. An appropriate amount of time was allowed for local anesthetics to take effect before proceeding to the next step. Local Anesthetic: Lidocaine 2.0% The unused portion of the local anesthetic was discarded in the proper designated containers. Technical description of process:  L3 Medial Branch Nerve Block (MBB): The target area for the L3 medial branch is at the junction of the postero-lateral aspect of the superior articular process and the superior, posterior, and medial edge of the transverse process of L4. Under fluoroscopic guidance, a Quincke needle was inserted until contact was made with os over the superior postero-lateral aspect of the pedicular shadow (target area). After negative aspiration for blood, 2mL of the nerve block solution was injected without difficulty or complication. The needle was removed intact. L4 Medial Branch Nerve Block (MBB): The target area for the L4 medial branch is at the  junction of the postero-lateral aspect of the superior articular process and the superior, posterior, and medial edge of the transverse process of L5. Under fluoroscopic guidance, a Quincke needle was inserted until contact was made with os over the superior postero-lateral aspect of the pedicular shadow (target area). After negative aspiration for blood, 2mL of the nerve block solution was injected without difficulty or complication. The needle was removed intact. L5 Medial Branch Nerve Block (MBB): The target area for the L5 medial branch is at the junction of the postero-lateral aspect of the superior articular process and the superior, posterior, and medial edge of the sacral ala. Under fluoroscopic guidance, a Quincke needle was inserted until contact was made with os over the superior postero-lateral aspect of the pedicular shadow (target area). After negative aspiration for blood, 2mL of the nerve block solution was injected without difficulty or complication. The needle was removed intact.   Once the entire procedure was completed, the treated area was cleaned, making sure to leave some of the prepping solution back to take advantage of its long term bactericidal properties.         Illustration of the posterior view of the lumbar spine and the posterior neural structures. Laminae of L2 through S1 are labeled. DPRL5, dorsal primary ramus of L5; DPRS1, dorsal primary ramus of S1; DPR3, dorsal primary ramus of L3; FJ, facet (zygapophyseal) joint L3-L4; I, inferior articular process of L4; LB1, lateral branch of dorsal primary ramus of L1; IAB, inferior articular branches from L3 medial branch (supplies L4-L5 facet joint); IBP, intermediate branch plexus; MB3, medial branch of dorsal primary ramus of L3; NR3, third lumbar nerve root; S, superior articular process of L5; SAB, superior articular branches from L4 (supplies L4-5 facet joint also); TP3, transverse process of L3.   Facet Joint  Innervation (* possible contribution)  L1-2 T12, L1 (L2*)  Medial Branch  L2-3 L1, L2 (L3*)         "          "  L3-4 L2, L3 (L4*)         "          "  L4-5 L3, L4 (L5*)         "          "  L5-S1 L4, L5, S1          "          "    Vitals:   02/25/23 0929 02/25/23 0935 02/25/23 0940 02/25/23 0942  BP: (!) 148/93 (!) 154/96 (!) 156/96 (!) 151/98  Pulse:      Resp: 16 17 18 17   Temp:      TempSrc:      SpO2: 100% 100% 100% 99%  Weight:      Height:         End Time: 0941 hrs.  Imaging Guidance (Spinal):          Type of Imaging Technique: Fluoroscopy Guidance (Spinal) Indication(s): Assistance in needle guidance and placement for procedures requiring needle placement in or near specific anatomical locations not easily accessible without such assistance. Exposure Time: Please see nurses notes. Contrast: None used. Fluoroscopic Guidance: I was personally present during the use of fluoroscopy. "Tunnel Vision Technique" used to obtain the best possible view of the target area. Parallax error corrected before commencing the procedure. "Direction-depth-direction" technique used to introduce the needle under continuous pulsed fluoroscopy. Once target was reached, antero-posterior, oblique, and lateral fluoroscopic projection used confirm needle placement in all planes. Images permanently stored in EMR. Interpretation: No contrast injected. I personally interpreted the imaging intraoperatively. Adequate needle placement confirmed in multiple planes. Permanent images saved into the patient's record.  Post-operative Assessment:  Post-procedure Vital Signs:  Pulse/HCG Rate: (!) 11914 Temp: (!) 97.1 F (36.2 C) Resp: 17 BP: (!) 151/98 SpO2: 99 %  EBL: None  Complications: No immediate post-treatment complications observed by team, or reported by patient.  Note: The patient tolerated the entire procedure well. A repeat set of vitals were taken after the procedure and the patient was  kept under observation following institutional policy, for this type of procedure. Post-procedural neurological assessment was performed, showing return to baseline, prior to discharge. The patient was provided with post-procedure discharge instructions, including a section on how to identify potential problems. Should any problems arise concerning this procedure, the patient was given instructions to immediately contact us, at any time, without hesitation. In any case, we plan to contact the patient by telephone for a follow-up status report regarding this interventional procedure.  Comments:  No additional relevant information.  Plan of Care (POC)  Orders:  Orders Placed This Encounter  Procedures   [facility] C-ARM 1-60 MIN NO REPORT    Intraoperative interpretation by procedural physician at [facility].    Standing Status:   Standing    Number of Occurrences:   1    Order Specific Question:   Reason for exam:    Answer:   Assistance in needle guidance and placement for procedures requiring needle placement in or near specific anatomical locations not easily accessible without such assistance.    Medications ordered for procedure: Meds ordered this encounter  Medications   lidocaine (XYLOCAINE) 2 % (with pres) injection 400 mg   dexamethasone (DECADRON) injection 10 mg   dexamethasone (DECADRON) injection 10 mg   ropivacaine (PF) 2 mg/mL (0.2%) (NAROPIN) injection 9 mL   ropivacaine (PF) 2 mg/mL (0.2%) (NAROPIN) injection 9 mL   Medications administered: We administered lidocaine, dexamethasone, dexamethasone, ropivacaine (PF) 2 mg/mL (0.2%), and ropivacaine (PF) 2 mg/mL (0.2%).  See the medical record for exact dosing, route,  and time of administration.  Follow-up plan:   Return in about 4 weeks (around 03/25/2023) for Post Procedure Evaluation, virtual.      Recent Visits Date Type Provider Dept  02/04/23 Office Visit Edward Jolly, MD [facility]   01/05/23 Procedure visit Edward Jolly, MD [facility]  12/04/22 Office Visit Edward Jolly, MD [facility]  Showing recent visits within past 90 days and meeting all other requirements Today's Visits Date Type Provider Dept  02/25/23 Procedure visit Edward Jolly, MD [facility]  Showing today's visits and meeting all other requirements Future Appointments Date Type Provider Dept  03/25/23 Appointment Edward Jolly, MD [facility]  Showing future appointments within next 90 days and meeting all other requirements  Disposition: Discharge home  Discharge (Date  Time): 02/25/2023; 0955 hrs.   Primary Care Physician: Rayetta Humphrey, MD Location: [facility] Note by: Edward Jolly, MD (TTS technology used. I apologize for any typographical errors that were not detected and corrected.) Date: 02/25/2023; Time: 9:57 AM  Disclaimer:  Medicine is not an Visual merchandiser. The only guarantee in medicine is that nothing is guaranteed. It is important to note that the decision to proceed with this intervention was based on the information collected from the patient. The Data and conclusions were drawn from the patient's questionnaire, the interview, and the physical examination. Because the information was provided in large part by the patient, it cannot be guaranteed that it has not been purposely or unconsciously manipulated. Every effort has been made to obtain as much relevant data as possible for this evaluation. It is important to note that the conclusions that lead to this procedure are derived in large part from the available data. Always take into account that the treatment will also be dependent on availability of resources and existing treatment guidelines, considered by other Pain Management Practitioners as being common knowledge and practice, at the time of the intervention. For Medico-Legal purposes, it is also important to  point out that variation in procedural techniques and pharmacological choices are the acceptable norm. The indications, contraindications, technique, and results of the above procedure should only be interpreted and judged by a Board-Certified Interventional Pain Specialist with extensive familiarity and expertise in the same exact procedure and technique.

## 2023-02-26 ENCOUNTER — Telehealth: Payer: Self-pay | Admitting: *Deleted

## 2023-02-26 DIAGNOSIS — J40 Bronchitis, not specified as acute or chronic: Secondary | ICD-10-CM | POA: Diagnosis not present

## 2023-02-26 NOTE — Telephone Encounter (Signed)
No problems post procedure. 

## 2023-03-16 ENCOUNTER — Other Ambulatory Visit: Payer: Self-pay | Admitting: Family Medicine

## 2023-03-16 DIAGNOSIS — Z1231 Encounter for screening mammogram for malignant neoplasm of breast: Secondary | ICD-10-CM

## 2023-03-17 ENCOUNTER — Ambulatory Visit: Payer: PPO | Admitting: Physician Assistant

## 2023-03-17 ENCOUNTER — Encounter: Payer: Self-pay | Admitting: Physician Assistant

## 2023-03-17 VITALS — BP 127/76 | HR 76 | Ht 68.0 in | Wt 166.4 lb

## 2023-03-17 DIAGNOSIS — R300 Dysuria: Secondary | ICD-10-CM

## 2023-03-17 DIAGNOSIS — N3281 Overactive bladder: Secondary | ICD-10-CM | POA: Diagnosis not present

## 2023-03-17 LAB — URINALYSIS, COMPLETE
Bilirubin, UA: NEGATIVE
Glucose, UA: NEGATIVE
Ketones, UA: NEGATIVE
Leukocytes,UA: NEGATIVE
Nitrite, UA: NEGATIVE
Protein,UA: NEGATIVE
RBC, UA: NEGATIVE
Specific Gravity, UA: 1.015 (ref 1.005–1.030)
Urobilinogen, Ur: 0.2 mg/dL (ref 0.2–1.0)
pH, UA: 6.5 (ref 5.0–7.5)

## 2023-03-17 LAB — MICROSCOPIC EXAMINATION

## 2023-03-17 MED ORDER — GEMTESA 75 MG PO TABS
75.0000 mg | ORAL_TABLET | Freq: Every day | ORAL | 0 refills | Status: DC
Start: 2023-03-17 — End: 2023-04-28

## 2023-03-17 NOTE — Progress Notes (Signed)
03/17/2023 12:26 PM   Sarah [name] 11-03-1931 [id number]  CC: Chief Complaint  Patient presents with   Dysuria   HPI: Sarah [name] is a 89 y.o. female with PMH recurrent UTI on cranberry supplements, OAB, and vaginal atrophy on vaginal estrogen cream with a recent history of recurrent dysuria with sterile pyuria who presents today for follow-up after completing 3 months of daily trimethoprim.    Today she reports no irritative voiding symptoms, however she states she cannot tell a difference on trimethoprim.  She reports continued daytime frequency every hour, nocturia x 4, and urge incontinence.  She wears absorbent underwear, 2+ daily that are damp.  She does stay with very well-hydrated, but cannot estimate how much.  She denies a history of sleep apnea, but states she has had a sleep study in the past and they told her she did have sleep apnea, but she did not think this was true so she stopped CPAP.  In-office UA and microscopy today pan negative.  PMH: Past Medical History:  Diagnosis Date   Benign essential HTN 09/21/2015   Cancer (HCC)    Skin CA resected from Right eyelid and both legs.   Seizures (HCC)    Thyroid disease     Surgical History: Past Surgical History:  Procedure Laterality Date   ABDOMINAL HYSTERECTOMY     BREAST BIOPSY Bilateral    neg   BREAST EXCISIONAL BIOPSY Right    KNEE ARTHROSCOPY W/ LATERAL RETINACULAR REPAIR      Home Medications:  Allergies as of 03/17/2023       Reactions   Macrobid [nitrofurantoin] Shortness Of Breath   Codeine Other (See Comments)   Makes her disoriented   Hydrocodone Other (See Comments)   Patient states she was seeing things that weren't there and very disoriented.   Oxycodone Other (See Comments)   hallucinations   Peanut-containing Drug Products    Other reaction(s): Headache   Peanuts [peanut Oil] Other (See Comments)   Violent Migraines   Penicillins Nausea Only   Tramadol  Other (See Comments)   Dizziness.        Medication List        Accurate as of March 17, 2023 12:26 PM. If you have any questions, ask your nurse or doctor.          STOP taking these medications    trimethoprim 100 MG tablet Commonly known as: TRIMPEX Stopped by: Carman Ching, PA-C       TAKE these medications    acetaminophen 500 MG tablet Commonly known as: TYLENOL Take 1,000 mg by mouth every 6 (six) hours as needed.   alendronate 70 MG tablet Commonly known as: FOSAMAX Take 70 mg by mouth every 7 (seven) days.   beta carotene w/minerals tablet Take 1 tablet by mouth daily.   bimatoprost 0.03 % ophthalmic solution Commonly known as: LUMIGAN Place 1 drop into both eyes at bedtime.   Lumigan 0.01 % Soln Generic drug: bimatoprost Place 1 drop into both eyes at bedtime.   dicyclomine 10 MG capsule Commonly known as: BENTYL Take 10 mg by mouth 4 (four) times daily -  before meals and at bedtime.   donepezil 10 MG disintegrating tablet Commonly known as: ARICEPT ODT Take 10 mg by mouth at bedtime.   DULoxetine HCl 40 MG Cpep Take 40 mg by mouth daily.   estradiol 0.1 MG/GM vaginal cream Commonly known as: ESTRACE Estrogen Cream Instruction Discard applicator Apply pea sized amount to  tip of finger to urethra before bed. Wash hands well after application. Use Monday, Wednesday and Friday   fexofenadine 180 MG tablet Commonly known as: ALLEGRA Take 180 mg by mouth daily.   gabapentin 300 MG capsule Commonly known as: NEURONTIN Take 300 mg by mouth 3 (three) times daily. 300 mg tid and 600 mg qhs   Gemtesa 75 MG Tabs Generic drug: Vibegron Take 1 tablet (75 mg total) by mouth daily. Started by: Carman Ching, PA-C   latanoprost 0.005 % ophthalmic solution Commonly known as: XALATAN   levothyroxine 100 MCG tablet Commonly known as: SYNTHROID Take 100 mcg by mouth daily.   lovastatin 20 MG tablet Commonly known as:  MEVACOR Take 20 mg by mouth daily.   melatonin 1 MG Tabs tablet Take 10 mg by mouth once.   montelukast 10 MG tablet Commonly known as: SINGULAIR Take 10 mg by mouth daily.   nortriptyline 10 MG capsule Commonly known as: PAMELOR Take 10 mg by mouth at bedtime.   omeprazole 20 MG capsule Commonly known as: PRILOSEC Take 20 mg by mouth daily.   polyethylene glycol powder 17 GM/SCOOP powder Commonly known as: GLYCOLAX/MIRALAX Take by mouth.   predniSONE 10 MG tablet Commonly known as: DELTASONE Take 1 tablet by mouth daily.   pregabalin 25 MG capsule Commonly known as: LYRICA Take 50 mg by mouth 2 (two) times daily. Patient to increase   Probiotic (Lactobacillus) Caps Take by mouth.   simvastatin 10 MG tablet Commonly known as: ZOCOR Take 10 mg by mouth at bedtime.        Allergies:  Allergies  Allergen Reactions   Macrobid [Nitrofurantoin] Shortness Of Breath   Codeine Other (See Comments)    Makes her disoriented   Hydrocodone Other (See Comments)    Patient states she was seeing things that weren't there and very disoriented.   Oxycodone Other (See Comments)    hallucinations   Peanut-Containing Drug Products     Other reaction(s): Headache   Peanuts [Peanut Oil] Other (See Comments)    Violent Migraines   Penicillins Nausea Only   Tramadol Other (See Comments)    Dizziness.    Family History: Family History  Problem Relation Age of Onset   Breast cancer Mother 41   Breast cancer Maternal Uncle 53   Breast cancer Maternal Aunt        mat great aunt   Breast cancer Maternal Aunt 25   Tracheal cancer Father    Cancer Sister    Brain cancer Sister    Cancer Brother     Social History:   reports that she has never smoked. She has never used smokeless tobacco. She reports that she does not drink alcohol. No history on file for drug use.  Physical Exam: BP 127/76   Pulse 76   Ht 5\' 8"  (1.727 m)   Wt 166 lb 6 oz (75.5 kg)   BMI 25.30 kg/m    Constitutional:  Alert and oriented, no acute distress, nontoxic appearing HEENT: [place], AT Cardiovascular: No clubbing, cyanosis, or edema Respiratory: Normal respiratory effort, no increased work of breathing Skin: No rashes, bruises or suspicious lesions Neurologic: Grossly intact, no focal deficits, moving all 4 extremities Psychiatric: Normal mood and affect  Laboratory Data: Results for orders placed or performed in visit on 03/17/23  Microscopic Examination   Urine  Result Value Ref Range   WBC, UA 0-5 0 - 5 /hpf   RBC, Urine 0-2 0 - 2 /hpf  Epithelial Cells (non renal) 0-10 0 - 10 /hpf   Bacteria, UA Few None seen/Few  Urinalysis, Complete  Result Value Ref Range   Specific Gravity, UA 1.015 1.005 - 1.030   pH, UA 6.5 5.0 - 7.5   Color, UA Yellow Yellow   Appearance Ur Clear Clear   Leukocytes,UA Negative Negative   Protein,UA Negative Negative/Trace   Glucose, UA Negative Negative   Ketones, UA Negative Negative   RBC, UA Negative Negative   Bilirubin, UA Negative Negative   Urobilinogen, Ur 0.2 0.2 - 1.0 mg/dL   Nitrite, UA Negative Negative   Microscopic Examination See below:    Assessment & Plan:   1. Dysuria No further episodes of dysuria on trimethoprim, however she is not able to appreciate a significant difference on the medication.  Will stop trimethoprim and consider resuming it if her dysuria episodes resume off it. - Urinalysis, Complete  2. OAB (overactive bladder) OAB wet with nocturia.  I gave her 6 weeks of samples of Gemtesa we will see her back for symptom recheck and PVR upon completion.  It sounds like she has been diagnosed with sleep apnea in the past but does not use CPAP, which is likely contributory to her nighttime symptoms. - Vibegron (GEMTESA) 75 MG TABS; Take 1 tablet (75 mg total) by mouth daily.  Dispense: 42 tablet; Refill: 0  Return in about 6 weeks (around 04/28/2023) for Symptom recheck with PVR.  Carman Ching,  PA-C  [facility] [place] [address] [place], [place] [address] [phone]

## 2023-03-23 DIAGNOSIS — R296 Repeated falls: Secondary | ICD-10-CM | POA: Diagnosis not present

## 2023-03-23 DIAGNOSIS — R202 Paresthesia of skin: Secondary | ICD-10-CM | POA: Diagnosis not present

## 2023-03-23 DIAGNOSIS — G629 Polyneuropathy, unspecified: Secondary | ICD-10-CM | POA: Diagnosis not present

## 2023-03-23 DIAGNOSIS — G8929 Other chronic pain: Secondary | ICD-10-CM | POA: Diagnosis not present

## 2023-03-23 DIAGNOSIS — R413 Other amnesia: Secondary | ICD-10-CM | POA: Diagnosis not present

## 2023-03-23 DIAGNOSIS — R278 Other lack of coordination: Secondary | ICD-10-CM | POA: Diagnosis not present

## 2023-03-23 DIAGNOSIS — M545 Low back pain, unspecified: Secondary | ICD-10-CM | POA: Diagnosis not present

## 2023-03-23 DIAGNOSIS — R200 Anesthesia of skin: Secondary | ICD-10-CM | POA: Diagnosis not present

## 2023-03-23 DIAGNOSIS — J22 Unspecified acute lower respiratory infection: Secondary | ICD-10-CM | POA: Diagnosis not present

## 2023-03-25 ENCOUNTER — Encounter: Payer: Self-pay | Admitting: Student in an Organized Health Care Education/Training Program

## 2023-03-25 ENCOUNTER — Ambulatory Visit
Payer: PPO | Attending: Student in an Organized Health Care Education/Training Program | Admitting: Student in an Organized Health Care Education/Training Program

## 2023-03-25 DIAGNOSIS — M47816 Spondylosis without myelopathy or radiculopathy, lumbar region: Secondary | ICD-10-CM

## 2023-03-25 DIAGNOSIS — G894 Chronic pain syndrome: Secondary | ICD-10-CM

## 2023-03-25 NOTE — Progress Notes (Signed)
Patient: Sarah [name]  Service Category: E/M  Provider: Edward Jolly, MD  DOB: 1931/11/22  DOS: 03/25/2023  Location: Office  MRN: [id number]  Setting: Ambulatory outpatient  Referring Provider: Rayetta Humphrey, MD  Type: Established Patient  Specialty: Interventional Pain Management  PCP: Rayetta Humphrey, MD  Location: Remote location  Delivery: TeleHealth     Virtual Encounter - Pain Management PROVIDER NOTE: Information contained herein reflects review and annotations entered in association with encounter. Interpretation of such information and data should be left to medically-trained personnel. Information provided to patient can be located elsewhere in the medical record under "Patient Instructions". Document created using STT-dictation technology, any transcriptional errors that may result from process are unintentional.    Contact & Pharmacy Preferred: [phone] Home: [phone] (home) Mobile: [phone] (mobile) E-mail: [email or web address]  [facility] [phone] Dan Humphre[place], [place] - [address] [place] [address] Phone: [phone] Fax: [phone]  [facility] - [place], [place] - [address] [place] [address] Phone: [phone] Fax: [phone]   Pre-screening  Ms. Castetter offered "in-person" vs "virtual" encounter. She indicated preferring virtual for this encounter.   Reason COVID-19*  Social distancing based on CDC and AMA recommendations.   I contacted [name] on 03/25/2023 via telephone.      I clearly identified myself as Edward Jolly, MD. I verified that I was speaking with the correct person using two identifiers (Name: Kaesyn Podbielski, and date of birth: 1931-10-07).  Consent I sought verbal advanced consent from [name] for virtual visit interactions. I informed Ms. Esser of possible security and privacy concerns, risks, and limitations associated  with providing "not-in-person" medical evaluation and management services. I also informed Ms. Cuzzort of the availability of "in-person" appointments. Finally, I informed her that there would be a charge for the virtual visit and that she could be  personally, fully or partially, financially responsible for it. Ms. Mitten expressed understanding and agreed to proceed.   Historic Elements   Ms. Dail Marciano is a 89 y.o. year old, female patient evaluated today after our last contact on 02/25/2023. Ms. Fasching  has a past medical history of Benign essential HTN (09/21/2015), Cancer (HCC), Seizures (HCC), and Thyroid disease. She also  has a past surgical history that includes Abdominal hysterectomy; Breast biopsy (Bilateral); Breast excisional biopsy (Right); and Knee arthroscopy w/ lateral retinacular repair. Ms. Trettin has a current medication list which includes the following prescription(s): acetaminophen, alendronate, beta carotene w/minerals, bimatoprost, dicyclomine, donepezil, duloxetine hcl, estradiol, fexofenadine, gabapentin, latanoprost, levothyroxine, lovastatin, lumigan, melatonin, montelukast, nortriptyline, polyethylene glycol powder, prednisone, pregabalin, probiotic (lactobacillus), simvastatin, gemtesa, and omeprazole. She  reports that she has never smoked. She has never used smokeless tobacco. She reports that she does not drink alcohol. No history on file for drug use. Ms. Arizola is allergic to macrobid [nitrofurantoin], codeine, hydrocodone, oxycodone, peanut-containing drug products, peanuts [peanut oil], penicillins, and tramadol.  BMI: Estimated body mass index is 25.3 kg/m as calculated from the following:   Height as of 03/17/23: 5\' 8"  (1.727 m).   Weight as of 03/17/23: 166 lb 6 oz (75.5 kg). Last encounter: 02/04/2023. Last procedure: 02/25/2023.  HPI  Today, she is being contacted for a post-procedure assessment.   Post-procedure evaluation   Procedure: Lumbar Facet,  Medial Branch Block(s) #2  Laterality: Bilateral  Level: L3, L4, and L5 Medial Branch Level(s). Injecting these levels blocks the L3-4 and L4-5  lumbar facet joints. Imaging: Fluoroscopic guidance         Anesthesia: Local anesthesia (1-2% Lidocaine) DOS: 02/25/2023 Performed by: Edward Jolly, MD  Primary Purpose: Diagnostic/Therapeutic Indications: Low back pain severe enough to impact quality of life or function. 1. Lumbar facet arthropathy (L4/5, L5/S1)   2. Spondylosis of lumbar joint   3. Chronic pain syndrome    NAS-11 Pain score:   Pre-procedure: 0-No pain/10   Post-procedure: 0-No pain/10      Effectiveness:  Initial hour after procedure: 100 %  Subsequent 4-6 hours post-procedure: 100 %  Analgesia past initial 6 hours: 100 % (2 1/2 weeks)  Ongoing improvement:  Analgesic:  80% Function: Ms. Timbs reports improvement in function ROM: Ms. Demoret reports improvement in ROM   Laboratory Chemistry Profile   Renal Lab Results  Component Value Date   BUN 23 (H) 09/14/2015   CREATININE 1.20 (H) 12/26/2022   GFRAA 77 09/12/2020   GFRNONAA 67 09/12/2020    Hepatic Lab Results  Component Value Date   AST 19 11/26/2015   ALT 15 11/26/2015   ALBUMIN 4.7 11/26/2015   ALKPHOS 64 11/26/2015    Electrolytes Lab Results  Component Value Date   NA 141 09/14/2015   K 3.5 09/14/2015   CL 107 09/14/2015   CALCIUM 9.7 09/14/2015   MG 2.3 01/20/2023    Bone No results found for: "VD25OH", "VD125OH2TOT", "WJ1914NW2", "NF6213YQ6", "25OHVITD1", "25OHVITD2", "25OHVITD3", "TESTOFREE", "TESTOSTERONE"  Inflammation (CRP: Acute Phase) (ESR: Chronic Phase) No results found for: "CRP", "ESRSEDRATE", "LATICACIDVEN"       Note: Above Lab results reviewed.   Assessment  The primary encounter diagnosis was Lumbar facet arthropathy (L4/5, L5/S1). Diagnoses of Spondylosis of lumbar joint and Chronic pain syndrome were also pertinent to this visit.  Plan of Care  Patient is status  post 2 positive diagnostic lumbar facet medial branch nerve blocks that helped her pain greater than 80% for at least 2 weeks.  Her first 1 was done on 01/05/2023 followed by her second 1 on 02/25/2023.  She states that for at least 2 weeks after the nerve block, she was more functional and was able to perform activities of daily living with less pain without having to sit down as frequently.  We discussed lumbar radiofrequency ablation for the purpose of obtaining longer-term pain relief.  Risk and benefits reviewed and patient elected proceed.  Orders Placed This Encounter  Procedures   Radiofrequency,Lumbar    Standing Status:   Future    Standing Expiration Date:   06/25/2023    Scheduling Instructions:     Side(s): Bilateral     Level(s):L3, L4, L5,Medial Branch Nerve(s)     Sedation: Without Sedation     Scheduling Timeframe: As soon as pre-approved    Order Specific Question:   Where will this procedure be performed?    Answer:   [facility]     Follow-up plan:   Return in about 26 days (around 04/20/2023) for B/L L3, 4, 5 RFA, in clinic NS (block 1 hr).      Finds benefit with bilateral sacroiliac joint injection, previous one 03/14/2019 and then second 1 05/25/2019.  Also finds lumbar epidural steroid injections for when she has sciatic flare, consider repeating.  Previous one performed on 10/13/2018 at L2-L3.  Status post bilateral C4, C5, C6, C7 cervical facet medial branch nerve block on 03/05/2020, 05/21/20 for cervical facet arthropathy. Bilateral greater occipital nerve block 08/13/2020 helped significantly, repeat as needed.  Recent Visits Date Type Provider Dept  02/25/23 Procedure visit Edward Jolly, MD [facility]  02/04/23 Office Visit Edward Jolly, MD [facility]  01/05/23 Procedure visit Edward Jolly, MD [facility]  Showing recent visits within past 90 days and meeting all other requirements Today's Visits Date Type  Provider Dept  03/25/23 Office Visit Edward Jolly, MD [facility]  Showing today's visits and meeting all other requirements Future Appointments No visits were found meeting these conditions. Showing future appointments within next 90 days and meeting all other requirements  I discussed the assessment and treatment plan with the patient. The patient was provided an opportunity to ask questions and all were answered. The patient agreed with the plan and demonstrated an understanding of the instructions.  Patient advised to call back or seek an in-person evaluation if the symptoms or condition worsens.  Duration of encounter: .  Note by: Edward Jolly, MD Date: 03/25/2023; Time: 2:07 PM

## 2023-03-28 DIAGNOSIS — J40 Bronchitis, not specified as acute or chronic: Secondary | ICD-10-CM | POA: Diagnosis not present

## 2023-04-07 ENCOUNTER — Ambulatory Visit
Admission: RE | Admit: 2023-04-07 | Discharge: 2023-04-07 | Disposition: A | Discharge status: Home / Self Care | Payer: PPO | Source: Ambulatory Visit | Attending: Family Medicine | Admitting: Family Medicine

## 2023-04-07 DIAGNOSIS — Z1231 Encounter for screening mammogram for malignant neoplasm of breast: Secondary | ICD-10-CM | POA: Insufficient documentation

## 2023-04-18 ENCOUNTER — Other Ambulatory Visit: Payer: Self-pay

## 2023-04-18 ENCOUNTER — Ambulatory Visit (INDEPENDENT_AMBULATORY_CARE_PROVIDER_SITE_OTHER): Payer: PPO

## 2023-04-18 ENCOUNTER — Ambulatory Visit
Admission: EM | Admit: 2023-04-18 | Discharge: 2023-04-18 | Disposition: A | Discharge status: Home / Self Care | Payer: PPO | Attending: Family Medicine | Admitting: Family Medicine

## 2023-04-18 ENCOUNTER — Encounter: Payer: Self-pay | Admitting: Emergency Medicine

## 2023-04-18 DIAGNOSIS — M25461 Effusion, right knee: Secondary | ICD-10-CM | POA: Diagnosis not present

## 2023-04-18 DIAGNOSIS — G8929 Other chronic pain: Secondary | ICD-10-CM | POA: Diagnosis not present

## 2023-04-18 DIAGNOSIS — M25561 Pain in right knee: Secondary | ICD-10-CM

## 2023-04-18 DIAGNOSIS — Z96651 Presence of right artificial knee joint: Secondary | ICD-10-CM | POA: Diagnosis not present

## 2023-04-18 HISTORY — DX: Polyneuropathy, unspecified: G62.9

## 2023-04-18 MED ORDER — NAPROXEN 500 MG PO TABS: 500.0000 mg | ORAL_TABLET | Freq: Two times a day (BID) | ORAL | 0 refills | Status: AC

## 2023-04-18 NOTE — ED Triage Notes (Addendum)
Pt here for right knee pain.  Hx fall 6-8 weeks ago and came here and negative xray.  1 week ago started again with pain. New swelling noted to medial knee.  No redness.  Increased pain with movement and ambulation.  Decreased ROM r/t pain.  Hx knee replacement.

## 2023-04-18 NOTE — Discharge Instructions (Addendum)
Take 1 tablet of Naprosyn for pain and if needed, take 2 tablets of Tylenol 2-3 times a day for additional relief.   Follow up with Dr Joseph Berkshire, looked upstairs in this building. Call his office for an appointment.

## 2023-04-18 NOTE — ED Provider Notes (Signed)
[facility]    CSN: [id number] Arrival date & time: 04/18/23  0865      History   Chief Complaint Chief Complaint  Patient presents with   Knee Pain         HPI  HPI Sarah [name] is a 89 y.o. female.   Sarah [name] presents for right knee pain a couple weeks ago. Denies falling or trauma. Had a knee replacement 20 years ago.  Took Tylenol for pain without relief.  She hears a pop.  Pain does not increase at night. Has constant pain.     Past Medical History:  Diagnosis Date   Benign essential HTN 09/21/2015   Cancer (HCC)    Skin CA resected from Right eyelid and both legs.   Neuropathy    Seizures (HCC)    Thyroid disease     Patient Active Problem List   Diagnosis Date Noted   Bilateral primary osteoarthritis of hip 01/22/2022   Bilateral occipital neuralgia 09/24/2020   Cervical facet joint syndrome 03/05/2020   Cervical spondylosis 03/01/2020   SI joint arthritis 06/23/2019   Chronic SI joint pain 06/23/2019   Carpal tunnel syndrome 06/07/2019   Low back pain 06/07/2019   Major depressive disorder with single episode, in full remission (HCC) 06/07/2019   Osteoarthritis of knee 06/07/2019   Pain in limb 06/07/2019   SOBOE (shortness of breath on exertion) 10/14/2018   Lumbar radiculopathy 07/22/2018   Lumbar disc herniation (L2/L3) 07/22/2018   Lumbar degenerative disc disease 07/22/2018   Chronic pain syndrome 07/22/2018   Neuropathy 07/22/2018   Localized, primary osteoarthritis 01/14/2018   Trochanteric bursitis of left hip 01/14/2018   Closed fracture of fifth toe of right foot 04/17/2017   Left leg pain 04/24/2016   Mixed hyperlipidemia 02/05/2016   History of fall 01/24/2016   Sensory ataxia 01/24/2016   Benign essential HTN 09/21/2015   Moderate tricuspid insufficiency 09/21/2015   Numbness in feet 04/12/2015   Mild vitamin D deficiency 10/23/2014   Allergic rhinitis 05/15/2014   Bilateral hip pain 09/06/2013    Dysphagia 04/15/2013   Squamous cell carcinoma 04/15/2013   Thyroid nodule 04/15/2013   Spondylosis of lumbar joint 12/07/2012   Stenosis of lumbosacral spine 12/07/2012   IBS (irritable bowel syndrome) 04/12/2012   Acquired hypothyroidism 06/24/2011   Osteopenia 05/14/2011    Past Surgical History:  Procedure Laterality Date   ABDOMINAL HYSTERECTOMY     BREAST BIOPSY Bilateral    neg   BREAST EXCISIONAL BIOPSY Right    KNEE ARTHROSCOPY W/ LATERAL RETINACULAR REPAIR      OB History   No obstetric history on file.      Home Medications    Prior to Admission medications   Medication Sig Start Date End Date Taking? Authorizing Provider  acetaminophen (TYLENOL) 500 MG tablet Take 1,000 mg by mouth every 6 (six) hours as needed.    [provider]  alendronate (FOSAMAX) 70 MG tablet Take 70 mg by mouth every 7 (seven) days. 01/26/21   [provider]  beta carotene w/minerals (OCUVITE) tablet Take 1 tablet by mouth daily.    [provider]  bimatoprost (LUMIGAN) 0.03 % ophthalmic solution Place 1 drop into both eyes at bedtime.    [provider]  dicyclomine (BENTYL) 10 MG capsule Take 10 mg by mouth 4 (four) times daily -  before meals and at bedtime.     [provider]  donepezil (ARICEPT ODT) 10 MG disintegrating tablet Take  10 mg by mouth at bedtime. 05/15/21   [provider]  DULoxetine HCl 40 MG CPEP Take 40 mg by mouth daily. 10/29/21   [provider]  estradiol (ESTRACE) 0.1 MG/GM vaginal cream Estrogen Cream Instruction Discard applicator Apply pea sized amount to tip of finger to urethra before bed. Wash hands well after application. Use Monday, Wednesday and Friday 12/12/22   Carman Ching, PA-C  fexofenadine (ALLEGRA) 180 MG tablet Take 180 mg by mouth daily.    [provider]  gabapentin (NEURONTIN) 300 MG capsule Take 300 mg by mouth 3 (three) times daily. 300 mg tid and 600 mg qhs Patient  not taking: Reported on 04/20/2023 10/29/21   [provider]  latanoprost (XALATAN) 0.005 % ophthalmic solution  09/19/20   [provider]  levothyroxine (SYNTHROID) 100 MCG tablet Take 100 mcg by mouth daily. 09/05/20   [provider]  lovastatin (MEVACOR) 20 MG tablet Take 20 mg by mouth daily. 01/04/20   [provider]  LUMIGAN 0.01 % SOLN Place 1 drop into both eyes at bedtime. 12/25/20   [provider]  Melatonin 1 MG TABS Take 10 mg by mouth once.    [provider]  montelukast (SINGULAIR) 10 MG tablet Take 10 mg by mouth daily. 09/13/21   [provider]  nortriptyline (PAMELOR) 10 MG capsule Take 10 mg by mouth at bedtime. 01/16/21   [provider]  omeprazole (PRILOSEC) 20 MG capsule Take 20 mg by mouth daily. 01/13/22 04/20/23  [provider]  polyethylene glycol powder (GLYCOLAX/MIRALAX) 17 GM/SCOOP powder Take by mouth. 03/29/20   [provider]  predniSONE (DELTASONE) 10 MG tablet Take 1 tablet by mouth daily. 02/19/23   [provider]  pregabalin (LYRICA) 25 MG capsule Take 50 mg by mouth 2 (two) times daily. Patient to increase    [provider]  Probiotic, Lactobacillus, CAPS Take by mouth.    [provider]  simvastatin (ZOCOR) 10 MG tablet Take 10 mg by mouth at bedtime.     [provider]  Vibegron (GEMTESA) 75 MG TABS Take 1 tablet (75 mg total) by mouth daily. 03/17/23   Carman Ching, PA-C    Family History Family History  Problem Relation Age of Onset   Breast cancer Mother 16   Breast cancer Maternal Uncle 60   Breast cancer Maternal Aunt        mat great aunt   Breast cancer Maternal Aunt 97   Tracheal cancer Father    Cancer Sister    Brain cancer Sister    Cancer Brother     Social History Social History   Tobacco Use   Smoking status: Never   Smokeless tobacco: Never  Vaping Use   Vaping status: Never Used  Substance  Use Topics   Alcohol use: No     Allergies   Macrobid [nitrofurantoin], Codeine, Hydrocodone, Oxycodone, Peanut-containing drug products, Peanuts [peanut oil], Penicillins, and Tramadol   Review of Systems Review of Systems: :negative unless otherwise stated in HPI.      Physical Exam Triage Vital Signs ED Triage Vitals  Encounter Vitals Group     BP 04/18/23 1027 134/88     Systolic BP Percentile --      Diastolic BP Percentile --      Pulse Rate 04/18/23 1027 82     Resp 04/18/23 1027 16     Temp 04/18/23 1027 98.9 F (37.2 C)     Temp Source  04/18/23 1027 Oral     SpO2 04/18/23 1027 94 %     Weight 04/18/23 1028 170 lb (77.1 kg)     Height 04/18/23 1028 5\' 8"  (1.727 m)     Head Circumference --      Peak Flow --      Pain Score 04/18/23 1027 4     Pain Loc --      Pain Education --      Exclude from Growth Chart --    No data found.  Updated Vital Signs BP 134/88   Pulse 82   Temp 98.9 F (37.2 C) (Oral)   Resp 16   Ht 5\' 8"  (1.727 m)   Wt 77.1 kg   SpO2 94%   BMI 25.85 kg/m   Visual Acuity Right Eye Distance:   Left Eye Distance:   Bilateral Distance:    Right Eye Near:   Left Eye Near:    Bilateral Near:     Physical Exam GEN: well appearing female in no acute distress  CVS: well perfused  RESP: speaking in full sentences without pause, no respiratory distress  MSK: Right Knee Exam -Inspection: no deformity, no discoloration, mild medial edema that extends anteriorly  -Palpation: medial, lateral and posterior joint line tenderness, no patellar tenderness, no palpable effusion, -ROM: full extension but limited flexion 2/2 to edema and pain  -Special Tests: Varus Stress: Negative; Valgus Stress: Negative; Anterior drawer: Negative; Posterior drawer: Negative; McMurray: Negative; Thessaly: not attempted; Patellar grind: Negative -Limb neurovascularly intact, no instability noted    UC Treatments / Results  Labs (all labs ordered are listed,  but only abnormal results are displayed) Labs Reviewed - No data to display  EKG   Radiology DG Knee Complete 4 Views Right  Result Date: 04/18/2023 CLINICAL DATA:  Fall 2 months ago.  Right knee pain and swelling. EXAM: RIGHT KNEE - COMPLETE 4+ VIEW COMPARISON:  01/20/2023 FINDINGS: Total knee arthroplasty again seen, with all 3 components in expected position. No No evidence of fracture or dislocation. Small knee joint effusion again seen, without significant change. No other osseous abnormality identified. IMPRESSION: No acute findings. Stable small knee joint effusion. Right total knee arthroplasty. Electronically Signed   By: Danae Orleans M.D.   On: 04/18/2023 11:26     Procedures Procedures (including critical care time)  Medications Ordered in UC Medications - No data to display  Initial Impression / Assessment and Plan / UC Course  I have reviewed the triage vital signs and the nursing notes.  Pertinent labs & imaging results that were available during my care of the patient were reviewed by me and considered in my medical decision making (see chart for details).      Pt is a 89 y.o.  female with acute on chronic right knee that has gotten progressively worse after a fall about 7 weeks ago.   On exam, pt has diffuse joint line tenderness.   Obtained  right knee plain films.  Personally interpreted by me were unremarkable for fracture or dislocation. Has evidence of reports knee replacement.  Radiologist report reviewed and additionally notes stable small knee effusion. Given brace prior to discharge.   Patient to gradually return to normal activities, as tolerated and continue ordinary activities within the limits permitted by pain. Prescribed Naproxen sodium for pain relief.  Tylenol PRN. Advised patient to avoid OTC NSAIDs while taking prescription NSAID. Counseled patient on red flag symptoms and when to seek immediate care.  No  red flags such as progressive major motor  weakness.   Patient to follow up with orthopedic provider, if symptoms do not improve with conservative treatment.  Return and ED precautions given. Understanding voiced. Discussed MDM, treatment plan and plan for follow-up with patient/parent who agrees with plan.   Final Clinical Impressions(s) / UC Diagnoses   Final diagnoses:  Chronic pain of right knee     Discharge Instructions      Take 1 tablet of Naprosyn for pain and if needed, take 2 tablets of Tylenol 2-3 times a day for additional relief.   Follow up with Dr Joseph Berkshire, looked upstairs in this building. Call his office for an appointment.      ED Prescriptions     Medication Sig Dispense Auth. Provider   naproxen (NAPROSYN) 500 MG tablet Take 1 tablet (500 mg total) by mouth 2 (two) times daily with a meal for 7 days. 14 tablet , Seward Meth, DO      PDMP not reviewed this encounter.   Katha Cabal, DO 04/26/23 0203

## 2023-04-20 ENCOUNTER — Ambulatory Visit
Payer: PPO | Attending: Student in an Organized Health Care Education/Training Program | Admitting: Student in an Organized Health Care Education/Training Program

## 2023-04-20 ENCOUNTER — Encounter: Payer: Self-pay | Admitting: Student in an Organized Health Care Education/Training Program

## 2023-04-20 ENCOUNTER — Ambulatory Visit: Admission: RE | Admit: 2023-04-20 | Payer: PPO | Source: Ambulatory Visit

## 2023-04-20 VITALS — BP 140/90 | HR 79 | Temp 97.4°F | Resp 18 | Ht 68.0 in | Wt 170.0 lb

## 2023-04-20 DIAGNOSIS — G894 Chronic pain syndrome: Secondary | ICD-10-CM | POA: Insufficient documentation

## 2023-04-20 DIAGNOSIS — M47816 Spondylosis without myelopathy or radiculopathy, lumbar region: Secondary | ICD-10-CM | POA: Insufficient documentation

## 2023-04-20 MED ORDER — ROPIVACAINE HCL 2 MG/ML IJ SOLN
9.0000 mL | Freq: Once | INTRAMUSCULAR | Status: AC
  Administered 2023-04-20: 9 mL via PERINEURAL

## 2023-04-20 MED ORDER — LIDOCAINE HCL 2 % IJ SOLN
20.0000 mL | Freq: Once | INTRAMUSCULAR | Status: AC
  Administered 2023-04-20: 400 mg

## 2023-04-20 MED ORDER — ROPIVACAINE HCL 2 MG/ML IJ SOLN
INTRAMUSCULAR | Status: AC
  Filled 2023-04-20: qty 20

## 2023-04-20 MED ORDER — DEXAMETHASONE SODIUM PHOSPHATE 10 MG/ML IJ SOLN
10.0000 mg | Freq: Once | INTRAMUSCULAR | Status: AC
  Administered 2023-04-20: 10 mg

## 2023-04-20 MED ORDER — DEXAMETHASONE SODIUM PHOSPHATE 10 MG/ML IJ SOLN
INTRAMUSCULAR | Status: AC
  Filled 2023-04-20: qty 2

## 2023-04-20 MED ORDER — LIDOCAINE HCL 2 % IJ SOLN
INTRAMUSCULAR | Status: AC
  Filled 2023-04-20: qty 20

## 2023-04-20 NOTE — Progress Notes (Signed)
Safety precautions to be maintained throughout the outpatient stay will include: orient to surroundings, keep bed in low position, maintain call bell within reach at all times, provide assistance with transfer out of bed and ambulation.  

## 2023-04-20 NOTE — Progress Notes (Signed)
PROVIDER NOTE: Interpretation of information contained herein should be left to medically-trained personnel. Specific patient instructions are provided elsewhere under "Patient Instructions" section of medical record. This document was created in part using STT-dictation technology, any transcriptional errors that may result from this process are unintentional.  Patient: Sarah [name] Type: Established DOB: 04/05/1932 MRN: [id number] PCP: Rayetta Humphrey, MD  Service: Procedure DOS: 04/20/2023 Setting: Ambulatory Location: Ambulatory outpatient facility Delivery: Face-to-face Provider: Edward Jolly, MD Specialty: Interventional Pain Management Specialty designation: 09 Location: Outpatient facility Ref. Prov.: Rayetta Humphrey, MD       Interventional Therapy   Procedure: Lumbar Facet, Medial Branch Radiofrequency Ablation (RFA) #1  Laterality: Bilateral (-50)  Level: L3, L4, and L5 Medial Branch Level(s). These levels will denervate the L3-4 and L4-5 lumbar facet joints.  Imaging: Fluoroscopy-guided         Anesthesia: Local anesthesia (1-2% Lidocaine) DOS: 04/20/2023  Performed by: Edward Jolly, MD  Purpose: Therapeutic/Palliative Indications: Low back pain severe enough to impact quality of life or function. Indications: 1. Lumbar facet arthropathy (L4/5, L5/S1)   2. Spondylosis of lumbar joint   3. Chronic pain syndrome    Sarah [name] has been dealing with the above chronic pain for longer than three months and has either failed to respond, was unable to tolerate, or simply did not get enough benefit from other more conservative therapies including, but not limited to: 1. Over-the-counter medications 2. Anti-inflammatory medications 3. Muscle relaxants 4. Membrane stabilizers 5. Opioids 6. Physical therapy and/or chiropractic manipulation 7. Modalities (Heat, ice, etc.) 8. Invasive techniques such as nerve blocks. Sarah [name] has attained more than 50% relief of the  pain from a series of diagnostic injections conducted in separate occasions.  Pain Score: Pre-procedure: 10-Worst pain ever/10 Post-procedure: 7/10     Position / Prep / Materials:  Position: Prone  Prep solution: DuraPrep (Iodine Povacrylex [0.7% available iodine] and Isopropyl Alcohol, 74% w/w) Prep Area: Entire Lumbosacral Region (Lower back from mid-thoracic region to end of tailbone and from flank to flank.) Materials:  Tray: RFA (Radiofrequency) tray Needle(s):  Type: RFA (Teflon-coated radiofrequency ablation needles) Gauge (G): 22  Length: Regular (10cm) Qty: 6      H&P (Pre-op Assessment):  Sarah [name] is a 89 y.o. (year old), female patient, seen today for interventional treatment. She  has a past surgical history that includes Abdominal hysterectomy; Breast biopsy (Bilateral); Breast excisional biopsy (Right); and Knee arthroscopy w/ lateral retinacular repair. Sarah [name] has a current medication list which includes the following prescription(s): acetaminophen, alendronate, beta carotene w/minerals, bimatoprost, dicyclomine, donepezil, duloxetine hcl, estradiol, fexofenadine, latanoprost, levothyroxine, lovastatin, lumigan, melatonin, montelukast, naproxen, nortriptyline, omeprazole, polyethylene glycol powder, prednisone, pregabalin, probiotic (lactobacillus), simvastatin, gemtesa, and gabapentin. Her primarily concern today is the Back Pain  Initial Vital Signs:  Pulse/HCG Rate: 79ECG Heart Rate: 77 Temp: (!) 97.4 F (36.3 C) Resp: 17 BP: 129/86 SpO2: 97 %  BMI: Estimated body mass index is 25.85 kg/m as calculated from the following:   Height as of this encounter: 5\' 8"  (1.727 m).   Weight as of this encounter: 170 lb (77.1 kg).  Risk Assessment: Allergies: Reviewed. She is allergic to macrobid [nitrofurantoin], codeine, hydrocodone, oxycodone, peanut-containing drug products, peanuts [peanut oil], penicillins, and tramadol.  Allergy Precautions: None  required Coagulopathies: Reviewed. None identified.  Blood-thinner therapy: None at this time Active Infection(s): Reviewed. None identified. Sarah [name] is afebrile  Site Confirmation: Sarah [name] was asked to confirm the procedure and laterality before marking the  site Procedure checklist: Completed Consent: Before the procedure and under the influence of no sedative(s), amnesic(s), or anxiolytics, the patient was informed of the treatment options, risks and possible complications. To fulfill our ethical and legal obligations, as recommended by the American Medical Association's Code of Ethics, I have informed the patient of my clinical impression; the nature and purpose of the treatment or procedure; the risks, benefits, and possible complications of the intervention; the alternatives, including doing nothing; the risk(s) and benefit(s) of the alternative treatment(s) or procedure(s); and the risk(s) and benefit(s) of doing nothing. The patient was provided information about the general risks and possible complications associated with the procedure. These may include, but are not limited to: failure to achieve desired goals, infection, bleeding, organ or nerve damage, allergic reactions, paralysis, and death. In addition, the patient was informed of those risks and complications associated to Spine-related procedures, such as failure to decrease pain; infection (i.e.: Meningitis, epidural or intraspinal abscess); bleeding (i.e.: epidural hematoma, subarachnoid hemorrhage, or any other type of intraspinal or peri-dural bleeding); organ or nerve damage (i.e.: Any type of peripheral nerve, nerve root, or spinal cord injury) with subsequent damage to sensory, motor, and/or autonomic systems, resulting in permanent pain, numbness, and/or weakness of one or several areas of the body; allergic reactions; (i.e.: anaphylactic reaction); and/or death. Furthermore, the patient was informed of those risks and  complications associated with the medications. These include, but are not limited to: allergic reactions (i.e.: anaphylactic or anaphylactoid reaction(s)); adrenal axis suppression; blood sugar elevation that in diabetics may result in ketoacidosis or comma; water retention that in patients with history of congestive heart failure may result in shortness of breath, pulmonary edema, and decompensation with resultant heart failure; weight gain; swelling or edema; medication-induced neural toxicity; particulate matter embolism and blood vessel occlusion with resultant organ, and/or nervous system infarction; and/or aseptic necrosis of one or more joints. Finally, the patient was informed that Medicine is not an exact science; therefore, there is also the possibility of unforeseen or unpredictable risks and/or possible complications that may result in a catastrophic outcome. The patient indicated having understood very clearly. We have given the patient no guarantees and we have made no promises. Enough time was given to the patient to ask questions, all of which were answered to the patient's satisfaction. Ms. Brekken has indicated that she wanted to continue with the procedure. Attestation: I, the ordering provider, attest that I have discussed with the patient the benefits, risks, side-effects, alternatives, likelihood of achieving goals, and potential problems during recovery for the procedure that I have provided informed consent. Date  Time: 04/20/2023  9:34 AM   Pre-Procedure Preparation:  Monitoring: As per clinic protocol. Respiration, ETCO2, SpO2, BP, heart rate and rhythm monitor placed and checked for adequate function Safety Precautions: Patient was assessed for positional comfort and pressure points before starting the procedure. Time-out: I initiated and conducted the "Time-out" before starting the procedure, as per protocol. The patient was asked to participate by confirming the accuracy of the  "Time Out" information. Verification of the correct person, site, and procedure were performed and confirmed by me, the nursing staff, and the patient. "Time-out" conducted as per Joint Commission's Universal Protocol (UP.01.01.01). Time: 1040 Start Time: 1040 hrs.  Description of Procedure:          Laterality: See above. Levels:  See above. Safety Precautions: Aspiration looking for blood return was conducted prior to all injections. At no point did we inject any substances, as  a needle was being advanced. Before injecting, the patient was told to immediately notify me if she was experiencing any new onset of "ringing in the ears, or metallic taste in the mouth". No attempts were made at seeking any paresthesias. Safe injection practices and needle disposal techniques used. Medications properly checked for expiration dates. SDV (single dose vial) medications used. After the completion of the procedure, all disposable equipment used was discarded in the proper designated medical waste containers. Local Anesthesia: Protocol guidelines were followed. The patient was positioned over the fluoroscopy table. The area was prepped in the usual manner. The time-out was completed. The target area was identified using fluoroscopy. A 12-in long, straight, sterile hemostat was used with fluoroscopic guidance to locate the targets for each level blocked. Once located, the skin was marked with an approved surgical skin marker. Once all sites were marked, the skin (epidermis, dermis, and hypodermis), as well as deeper tissues (fat, connective tissue and muscle) were infiltrated with a small amount of a short-acting local anesthetic, loaded on a 10cc syringe with a 25G, 1.5-in  Needle. An appropriate amount of time was allowed for local anesthetics to take effect before proceeding to the next step. Technical description of process:  Radiofrequency Ablation (RFA) L3 Medial Branch Nerve RFA: The target area for the L3  medial branch is at the junction of the postero-lateral aspect of the superior articular process and the superior, posterior, and medial edge of the transverse process of L4. Under fluoroscopic guidance, a Radiofrequency needle was inserted until contact was made with os over the superior postero-lateral aspect of the pedicular shadow (target area). Sensory and motor testing was conducted to properly adjust the position of the needle. Once satisfactory placement of the needle was achieved, the numbing solution was slowly injected after negative aspiration for blood. 2.0 mL of the nerve block solution was injected without difficulty or complication. After waiting for at least 3 minutes, the ablation was performed. Once completed, the needle was removed intact. L4 Medial Branch Nerve RFA: The target area for the L4 medial branch is at the junction of the postero-lateral aspect of the superior articular process and the superior, posterior, and medial edge of the transverse process of L5. Under fluoroscopic guidance, a Radiofrequency needle was inserted until contact was made with os over the superior postero-lateral aspect of the pedicular shadow (target area). Sensory and motor testing was conducted to properly adjust the position of the needle. Once satisfactory placement of the needle was achieved, the numbing solution was slowly injected after negative aspiration for blood. 2.0 mL of the nerve block solution was injected without difficulty or complication. After waiting for at least 3 minutes, the ablation was performed. Once completed, the needle was removed intact. L5 Medial Branch Nerve RFA: The target area for the L5 medial branch is at the junction of the postero-lateral aspect of the superior articular process of S1 and the superior, posterior, and medial edge of the sacral ala. Under fluoroscopic guidance, a Radiofrequency needle was inserted until contact was made with os over the superior postero-lateral  aspect of the pedicular shadow (target area). Sensory and motor testing was conducted to properly adjust the position of the needle. Once satisfactory placement of the needle was achieved, the numbing solution was slowly injected after negative aspiration for blood. 2.0 mL of the nerve block solution was injected without difficulty or complication. After waiting for at least 3 minutes, the ablation was performed. Once completed, the needle was removed intact.  12 cc solution made of 10 cc of 0.2% ropivacaine, 2 cc of Decadron 10 mg/cc. 2cc injected at each level above after sensorimotor testing, prior to lesioning   Radiofrequency lesioning (ablation):  Radiofrequency Generator: Medtronic AccurianTM AG 1000 RF Generator Sensory Stimulation Parameters: 50 Hz was used to locate & identify the nerve, making sure that the needle was positioned such that there was no sensory stimulation below 0.3 V or above 0.7 V. Motor Stimulation Parameters: 2 Hz was used to evaluate the motor component. Care was taken not to lesion any nerves that demonstrated motor stimulation of the lower extremities at an output of less than 2.5 times that of the sensory threshold, or a maximum of 2.0 V. Lesioning Technique Parameters: Standard Radiofrequency settings. (Not bipolar or pulsed.) Temperature Settings: 80 degrees C Lesioning time: 60 seconds Stationary intra-operative compliance: Compliant  Once the entire procedure was completed, the treated area was cleaned, making sure to leave some of the prepping solution back to take advantage of its long term bactericidal properties.    Illustration of the posterior view of the lumbar spine and the posterior neural structures. Laminae of L2 through S1 are labeled. DPRL5, dorsal primary ramus of L5; DPRS1, dorsal primary ramus of S1; DPR3, dorsal primary ramus of L3; FJ, facet (zygapophyseal) joint L3-L4; I, inferior articular process of L4; LB1, lateral branch of dorsal primary  ramus of L1; IAB, inferior articular branches from L3 medial branch (supplies L4-L5 facet joint); IBP, intermediate branch plexus; MB3, medial branch of dorsal primary ramus of L3; NR3, third lumbar nerve root; S, superior articular process of L5; SAB, superior articular branches from L4 (supplies L4-5 facet joint also); TP3, transverse process of L3.  Facet Joint Innervation (* possible contribution)  L1-2 T12, L1 (L2*)  Medial Branch  L2-3 L1, L2 (L3*)         "          "  L3-4 L2, L3 (L4*)         "          "  L4-5 L3, L4 (L5*)         "          "  L5-S1 L4, L5, S1          "          "    Vitals:   04/20/23 1050 04/20/23 1055 04/20/23 1100 04/20/23 1105  BP: (!) 145/95 (!) 144/98 (!) 140/97 (!) 140/90  Pulse:      Resp: 16 18 17 18   Temp:      TempSrc:      SpO2: 95% 95% 95% 95%  Weight:      Height:        Start Time: 1040 hrs. End Time: 1105 hrs.  Imaging Guidance (Spinal):          Type of Imaging Technique: Fluoroscopy Guidance (Spinal) Indication(s): Assistance in needle guidance and placement for procedures requiring needle placement in or near specific anatomical locations not easily accessible without such assistance. Exposure Time: Please see nurses notes. Contrast: None used. Fluoroscopic Guidance: I was personally present during the use of fluoroscopy. "Tunnel Vision Technique" used to obtain the best possible view of the target area. Parallax error corrected before commencing the procedure. "Direction-depth-direction" technique used to introduce the needle under continuous pulsed fluoroscopy. Once target was reached, antero-posterior, oblique, and lateral fluoroscopic projection used confirm needle placement in all planes. Images permanently stored in EMR. Interpretation:  No contrast injected. I personally interpreted the imaging intraoperatively. Adequate needle placement confirmed in multiple planes. Permanent images saved into the patient's record.  Antibiotic  Prophylaxis:   Anti-infectives (From admission, onward)    None      Indication(s): None identified  Post-operative Assessment:  Post-procedure Vital Signs:  Pulse/HCG Rate: 7984 Temp: (!) 97.4 F (36.3 C) Resp: 18 BP: (!) 140/90 SpO2: 95 %  EBL: None  Complications: No immediate post-treatment complications observed by team, or reported by patient.  Note: The patient tolerated the entire procedure well. A repeat set of vitals were taken after the procedure and the patient was kept under observation following institutional policy, for this type of procedure. Post-procedural neurological assessment was performed, showing return to baseline, prior to discharge. The patient was provided with post-procedure discharge instructions, including a section on how to identify potential problems. Should any problems arise concerning this procedure, the patient was given instructions to immediately contact us, at any time, without hesitation. In any case, we plan to contact the patient by telephone for a follow-up status report regarding this interventional procedure.  Comments:  No additional relevant information.  Plan of Care (POC)  Orders:  Orders Placed This Encounter  Procedures   DG PAIN CL[facility] C-ARM 1-60 MIN NO REPORT    Intraoperative interpretation by procedural physician at [facility].    Standing Status:   Standing    Number of Occurrences:   1    Order Specific Question:   Reason for exam:    Answer:   Assistance in needle guidance and placement for procedures requiring needle placement in or near specific anatomical locations not easily accessible without such assistance.    Medications ordered for procedure: Meds ordered this encounter  Medications   lidocaine (XYLOCAINE) 2 % (with pres) injection 400 mg   dexamethasone (DECADRON) injection 10 mg   dexamethasone (DECADRON) injection 10 mg   ropivacaine (PF) 2 mg/mL (0.2%) (NAROPIN) injection 9 mL    ropivacaine (PF) 2 mg/mL (0.2%) (NAROPIN) injection 9 mL   Medications administered: We administered lidocaine, dexamethasone, dexamethasone, ropivacaine (PF) 2 mg/mL (0.2%), and ropivacaine (PF) 2 mg/mL (0.2%).  See the medical record for exact dosing, route, and time of administration.  Follow-up plan:   Return in about 6 weeks (around 06/01/2023) for F2F PPE.       Finds benefit with bilateral sacroiliac joint injection, previous one 03/14/2019 and then second 1 05/25/2019.  Also finds lumbar epidural steroid injections for when she has sciatic flare, consider repeating.  Previous one performed on 10/13/2018 at L2-L3.  Status post bilateral C4, C5, C6, C7 cervical facet medial branch nerve block on 03/05/2020, 05/21/20 for cervical facet arthropathy. Bilateral greater occipital nerve block 08/13/2020 helped significantly, repeat as needed.             Recent Visits Date Type Provider Dept  03/25/23 Office Visit Edward Jolly, MD [facility]  02/25/23 Procedure visit Edward Jolly, MD [facility]  02/04/23 Office Visit Edward Jolly, MD [facility]  Showing recent visits within past 90 days and meeting all other requirements Today's Visits Date Type Provider Dept  04/20/23 Procedure visit Edward Jolly, MD [facility]  Showing today's visits and meeting all other requirements Future Appointments Date Type Provider Dept  06/01/23 Appointment Edward Jolly, MD [facility]  Showing future appointments within next 90 days and meeting all other requirements  Disposition: Discharge home  Discharge (Date  Time): 04/20/2023; 1115  hrs.   Primary Care Physician: Rayetta Humphrey, MD Location: [facility] Note by: Edward Jolly, MD (TTS technology used. I apologize for any typographical errors that were not detected and corrected.) Date: 04/20/2023; Time: 11:58 AM  Disclaimer:  Medicine is not an Visual merchandiser. The  only guarantee in medicine is that nothing is guaranteed. It is important to note that the decision to proceed with this intervention was based on the information collected from the patient. The Data and conclusions were drawn from the patient's questionnaire, the interview, and the physical examination. Because the information was provided in large part by the patient, it cannot be guaranteed that it has not been purposely or unconsciously manipulated. Every effort has been made to obtain as much relevant data as possible for this evaluation. It is important to note that the conclusions that lead to this procedure are derived in large part from the available data. Always take into account that the treatment will also be dependent on availability of resources and existing treatment guidelines, considered by other Pain Management Practitioners as being common knowledge and practice, at the time of the intervention. For Medico-Legal purposes, it is also important to point out that variation in procedural techniques and pharmacological choices are the acceptable norm. The indications, contraindications, technique, and results of the above procedure should only be interpreted and judged by a Board-Certified Interventional Pain Specialist with extensive familiarity and expertise in the same exact procedure and technique.

## 2023-04-20 NOTE — Progress Notes (Signed)
1114 patient taken to RR due to "feeling weak". PO fluids given and Dr. Cherylann Ratel in to see. Report to [facility].

## 2023-04-20 NOTE — Patient Instructions (Signed)

## 2023-04-21 ENCOUNTER — Telehealth: Payer: Self-pay

## 2023-04-21 NOTE — Telephone Encounter (Signed)
Post procedure follow up.  Patient states she is doing good.  

## 2023-04-22 ENCOUNTER — Other Ambulatory Visit: Payer: Self-pay | Admitting: Physician Assistant

## 2023-04-28 ENCOUNTER — Ambulatory Visit: Payer: PPO | Admitting: Physician Assistant

## 2023-04-28 ENCOUNTER — Telehealth: Payer: Self-pay | Admitting: Student in an Organized Health Care Education/Training Program

## 2023-04-28 ENCOUNTER — Encounter: Payer: Self-pay | Admitting: Physician Assistant

## 2023-04-28 VITALS — BP 123/73 | HR 94 | Ht 68.0 in | Wt 168.0 lb

## 2023-04-28 DIAGNOSIS — S8001XA Contusion of right knee, initial encounter: Secondary | ICD-10-CM | POA: Diagnosis not present

## 2023-04-28 DIAGNOSIS — R300 Dysuria: Secondary | ICD-10-CM | POA: Diagnosis not present

## 2023-04-28 DIAGNOSIS — J40 Bronchitis, not specified as acute or chronic: Secondary | ICD-10-CM | POA: Diagnosis not present

## 2023-04-28 DIAGNOSIS — N3281 Overactive bladder: Secondary | ICD-10-CM | POA: Diagnosis not present

## 2023-04-28 DIAGNOSIS — Z96651 Presence of right artificial knee joint: Secondary | ICD-10-CM | POA: Diagnosis not present

## 2023-04-28 LAB — BLADDER SCAN AMB NON-IMAGING: Scan Result: 0

## 2023-04-28 MED ORDER — GEMTESA 75 MG PO TABS
75.0000 mg | ORAL_TABLET | Freq: Every day | ORAL | 11 refills | Status: DC
Start: 2023-04-28 — End: 2023-05-04

## 2023-04-28 MED ORDER — TRIMETHOPRIM 100 MG PO TABS
100.0000 mg | ORAL_TABLET | Freq: Every day | ORAL | 5 refills | Status: DC
Start: 2023-04-28 — End: 2023-08-24

## 2023-04-28 NOTE — Telephone Encounter (Signed)
Had RFA on 04-20-23.  Attempted to call patient, message left.

## 2023-04-28 NOTE — Telephone Encounter (Signed)
Last procedures did not help, what is next step

## 2023-04-28 NOTE — Progress Notes (Signed)
04/28/2023 2:45 PM   Sarah [name] [birth year] [id number]  CC: Chief Complaint  Patient presents with   Other   HPI: Sarah [name] is a 89 y.o. female with PMH recurrent UTI on cranberry supplements, OAB with nocturia, and GSM on vaginal estrogen cream with a recent history of recurrent dysuria with sterile pyuria who presents today for symptom recheck on Gemtesa.   Today she reports her urinary symptoms are well-controlled on Gemtesa.  She reports stable nocturia x 2, but during the day her frequency has improved and her urge incontinence has resolved.  She has had a couple of episodes of nocturnal enuresis that she attributes to delaying voids overnight.  Overall she is pleased and wishes to continue the medication.  Additionally, she reports she did not stop daily trimethoprim after her most recent clinic visit with me and she has not had any further episodes of dysuria.  She would like to continue this as well.  PVR 0 mL.  PMH: Past Medical History:  Diagnosis Date   Benign essential HTN 09/21/2015   Cancer (HCC)    Skin CA resected from Right eyelid and both legs.   Neuropathy    Seizures (HCC)    Thyroid disease     Surgical History: Past Surgical History:  Procedure Laterality Date   ABDOMINAL HYSTERECTOMY     BREAST BIOPSY Bilateral    neg   BREAST EXCISIONAL BIOPSY Right    KNEE ARTHROSCOPY W/ LATERAL RETINACULAR REPAIR      Home Medications:  Allergies as of 04/28/2023       Reactions   Macrobid [nitrofurantoin] Shortness Of Breath   Codeine Other (See Comments)   Makes her disoriented   Hydrocodone Other (See Comments)   Patient states she was seeing things that weren't there and very disoriented.   Oxycodone Other (See Comments)   hallucinations   Peanut-containing Drug Products    Other reaction(s): Headache   Peanuts [peanut Oil] Other (See Comments)   Violent Migraines   Penicillins Nausea Only   Tramadol Other (See  Comments)   Dizziness.        Medication List        Accurate as of April 28, 2023  2:45 PM. If you have any questions, ask your nurse or doctor.          acetaminophen 500 MG tablet Commonly known as: TYLENOL Take 1,000 mg by mouth every 6 (six) hours as needed.   alendronate 70 MG tablet Commonly known as: FOSAMAX Take 70 mg by mouth every 7 (seven) days.   beta carotene w/minerals tablet Take 1 tablet by mouth daily.   bimatoprost 0.03 % ophthalmic solution Commonly known as: LUMIGAN Place 1 drop into both eyes at bedtime.   Lumigan 0.01 % Soln Generic drug: bimatoprost Place 1 drop into both eyes at bedtime.   dicyclomine 10 MG capsule Commonly known as: BENTYL Take 10 mg by mouth 4 (four) times daily -  before meals and at bedtime.   donepezil 10 MG disintegrating tablet Commonly known as: ARICEPT ODT Take 10 mg by mouth at bedtime.   DULoxetine HCl 40 MG Cpep Take 40 mg by mouth daily.   estradiol 0.1 MG/GM vaginal cream Commonly known as: ESTRACE Estrogen Cream Instruction Discard applicator Apply pea sized amount to tip of finger to urethra before bed. Wash hands well after application. Use Monday, Wednesday and Friday   fexofenadine 180 MG tablet Commonly known as: ALLEGRA Take 180 mg by  mouth daily.   gabapentin 300 MG capsule Commonly known as: NEURONTIN Take 300 mg by mouth 3 (three) times daily. 300 mg tid and 600 mg qhs   Gemtesa 75 MG Tabs Generic drug: Vibegron Take 1 tablet (75 mg total) by mouth daily.   latanoprost 0.005 % ophthalmic solution Commonly known as: XALATAN   levothyroxine 100 MCG tablet Commonly known as: SYNTHROID Take 100 mcg by mouth daily.   lovastatin 20 MG tablet Commonly known as: MEVACOR Take 20 mg by mouth daily.   melatonin 1 MG Tabs tablet Take 10 mg by mouth once.   montelukast 10 MG tablet Commonly known as: SINGULAIR Take 10 mg by mouth daily.   nortriptyline 10 MG capsule Commonly known as:  PAMELOR Take 10 mg by mouth at bedtime.   omeprazole 20 MG capsule Commonly known as: PRILOSEC Take 20 mg by mouth daily.   polyethylene glycol powder 17 GM/SCOOP powder Commonly known as: GLYCOLAX/MIRALAX Take by mouth.   predniSONE 10 MG tablet Commonly known as: DELTASONE Take 1 tablet by mouth daily.   pregabalin 25 MG capsule Commonly known as: LYRICA Take 50 mg by mouth 2 (two) times daily. Patient to increase   Probiotic (Lactobacillus) Caps Take by mouth.   simvastatin 10 MG tablet Commonly known as: ZOCOR Take 10 mg by mouth at bedtime.   trimethoprim 100 MG tablet Commonly known as: TRIMPEX Take 1 tablet (100 mg total) by mouth daily. Started by: Carman Ching        Allergies:  Allergies  Allergen Reactions   Macrobid [Nitrofurantoin] Shortness Of Breath   Codeine Other (See Comments)    Makes her disoriented   Hydrocodone Other (See Comments)    Patient states she was seeing things that weren't there and very disoriented.   Oxycodone Other (See Comments)    hallucinations   Peanut-Containing Drug Products     Other reaction(s): Headache   Peanuts [Peanut Oil] Other (See Comments)    Violent Migraines   Penicillins Nausea Only   Tramadol Other (See Comments)    Dizziness.    Family History: Family History  Problem Relation Age of Onset   Breast cancer Mother 47   Breast cancer Maternal Uncle 60   Breast cancer Maternal Aunt        mat great aunt   Breast cancer Maternal Aunt 56   Tracheal cancer Father    Cancer Sister    Brain cancer Sister    Cancer Brother     Social History:   reports that she has never smoked. She has never used smokeless tobacco. She reports that she does not drink alcohol. No history on file for drug use.  Physical Exam: BP 123/73   Pulse 94   Ht 5\' 8"  (1.727 m)   Wt 168 lb (76.2 kg)   BMI 25.54 kg/m   Constitutional:  Alert and oriented, no acute distress, nontoxic appearing HEENT: [place],  AT Cardiovascular: No clubbing, cyanosis, or edema Respiratory: Normal respiratory effort, no increased work of breathing Skin: No rashes, bruises or suspicious lesions Neurologic: Grossly intact, no focal deficits, moving all 4 extremities Psychiatric: Normal mood and affect  Laboratory Data: Results for orders placed or performed in visit on 04/28/23  Bladder Scan (Post Void Residual) in office  Result Value Ref Range   Scan Result 0    Assessment & Plan:   1. OAB (overactive bladder) Urge incontinence resolved on Gemtesa, improved daytime frequency, unclear improvement in nocturia, but she continues  to empty well.  Will continue Gemtesa. - Bladder Scan (Post Void Residual) in office - Vibegron (GEMTESA) 75 MG TABS; Take 1 tablet (75 mg total) by mouth daily.  Dispense: 30 tablet; Refill: 11  2. Dysuria No further episodes of dysuria since her last clinic visit.  Will continue trimethoprim at least another 6 months. - trimethoprim (TRIMPEX) 100 MG tablet; Take 1 tablet (100 mg total) by mouth daily.  Dispense: 30 tablet; Refill: 5  Return in about 6 months (around 10/29/2023) for Symptom recheck with PVR.  Carman Ching, PA-C  [facility] [place] [address], [address] [place], [place] [address] [phone]

## 2023-04-29 NOTE — Telephone Encounter (Signed)
Reassured patient that it may be expected to experience pain for about 6 weeks following the procedure.

## 2023-05-04 ENCOUNTER — Other Ambulatory Visit: Payer: Self-pay | Admitting: Physician Assistant

## 2023-05-04 ENCOUNTER — Telehealth: Payer: Self-pay | Admitting: Physician Assistant

## 2023-05-04 DIAGNOSIS — N3281 Overactive bladder: Secondary | ICD-10-CM

## 2023-05-04 DIAGNOSIS — Z96651 Presence of right artificial knee joint: Secondary | ICD-10-CM

## 2023-05-04 MED ORDER — MIRABEGRON ER 50 MG PO TB24
50.0000 mg | ORAL_TABLET | Freq: Every day | ORAL | 11 refills | Status: DC
Start: 2023-05-04 — End: 2023-07-13

## 2023-05-04 NOTE — Telephone Encounter (Signed)
Pt said Leslye Peer was $200 and she wanted to know if you could recommend something else that was cheaper.  [facility] in [place].

## 2023-05-04 NOTE — Telephone Encounter (Signed)
Ok to stop [place] due to cost. I sent a prescription for mirabegron 50mg  to her pharmacy as an alternative. Unfortunately I can't get a cost estimate for this drug for her, so if it is also too expensive when she goes to fill it, she should call us for further assistance.

## 2023-05-05 NOTE — Telephone Encounter (Signed)
Pt states she will try mirabegron for one month since she already picked it up. Pt states it is expensive and would like a cheaper option.

## 2023-05-06 ENCOUNTER — Ambulatory Visit
Admission: RE | Admit: 2023-05-06 | Discharge: 2023-05-06 | Disposition: A | Discharge status: Home / Self Care | Payer: PPO | Source: Ambulatory Visit | Attending: Physician Assistant | Admitting: Physician Assistant

## 2023-05-06 DIAGNOSIS — M25569 Pain in unspecified knee: Secondary | ICD-10-CM | POA: Diagnosis not present

## 2023-05-06 DIAGNOSIS — Z96651 Presence of right artificial knee joint: Secondary | ICD-10-CM | POA: Diagnosis not present

## 2023-05-06 MED ORDER — TECHNETIUM TC 99M MEDRONATE IV KIT
20.0000 | PACK | Freq: Once | INTRAVENOUS | Status: AC | PRN
  Administered 2023-05-06: 19.96 via INTRAVENOUS

## 2023-05-08 ENCOUNTER — Other Ambulatory Visit: Payer: PPO

## 2023-05-27 ENCOUNTER — Encounter: Payer: Self-pay | Admitting: Student in an Organized Health Care Education/Training Program

## 2023-05-27 ENCOUNTER — Ambulatory Visit
Payer: PPO | Attending: Student in an Organized Health Care Education/Training Program | Admitting: Student in an Organized Health Care Education/Training Program

## 2023-05-27 VITALS — BP 132/83 | HR 99 | Temp 97.3°F | Resp 16 | Ht 68.0 in | Wt 163.0 lb

## 2023-05-27 DIAGNOSIS — M1711 Unilateral primary osteoarthritis, right knee: Secondary | ICD-10-CM | POA: Insufficient documentation

## 2023-05-27 DIAGNOSIS — M25561 Pain in right knee: Secondary | ICD-10-CM | POA: Diagnosis not present

## 2023-05-27 DIAGNOSIS — G894 Chronic pain syndrome: Secondary | ICD-10-CM | POA: Diagnosis not present

## 2023-05-27 DIAGNOSIS — Z96651 Presence of right artificial knee joint: Secondary | ICD-10-CM | POA: Insufficient documentation

## 2023-05-27 DIAGNOSIS — M5126 Other intervertebral disc displacement, lumbar region: Secondary | ICD-10-CM | POA: Insufficient documentation

## 2023-05-27 DIAGNOSIS — M47816 Spondylosis without myelopathy or radiculopathy, lumbar region: Secondary | ICD-10-CM | POA: Diagnosis not present

## 2023-05-27 DIAGNOSIS — G8929 Other chronic pain: Secondary | ICD-10-CM | POA: Diagnosis not present

## 2023-05-27 DIAGNOSIS — S8001XA Contusion of right knee, initial encounter: Secondary | ICD-10-CM | POA: Diagnosis not present

## 2023-05-27 MED ORDER — METHOCARBAMOL 1000 MG/10ML IJ SOLN
200.0000 mg | Freq: Once | INTRAMUSCULAR | Status: AC
  Administered 2023-05-27: 200 mg via INTRAMUSCULAR

## 2023-05-27 MED ORDER — METHOCARBAMOL 1000 MG/10ML IJ SOLN
INTRAMUSCULAR | Status: AC
  Filled 2023-05-27: qty 10

## 2023-05-27 NOTE — Progress Notes (Signed)
PROVIDER NOTE: Information contained herein reflects review and annotations entered in association with encounter. Interpretation of such information and data should be left to medically-trained personnel. Information provided to patient can be located elsewhere in the medical record under "Patient Instructions". Document created using STT-dictation technology, any transcriptional errors that may result from process are unintentional.    Patient: Sarah [name]  Service Category: E/M  Provider: Edward Jolly, MD  DOB: December 15, 1931  DOS: 05/27/2023  Referring Provider: Rayetta Humphrey, MD  MRN: [id number]  Specialty: Interventional Pain Management  PCP: Rayetta Humphrey, MD  Type: Established Patient  Setting: Ambulatory outpatient    Location: Office  Delivery: Face-to-face     HPI  [name], a 89 y.o. year old female, is here today because of her Primary osteoarthritis of right knee [M17.11]. Ms. Lundy primary complain today is right knee pain and Back Pain  Pertinent problems: Ms. Coomer has Spondylosis of lumbar joint; Lumbar radiculopathy; Lumbar disc herniation (L2/L3); Lumbar degenerative disc disease; Chronic pain syndrome; Neuropathy; Thyroid nodule; and Bilateral occipital neuralgia on their pertinent problem list. Pain Assessment: Severity of Chronic pain is reported as a 7 /10. Location: Back Lower/to hips bilat. Onset: More than a month ago. Quality: Constant, Aching, Discomfort, Sharp. Timing: Constant. Modifying factor(s): rest, sitting. Vitals:  height is 5\' 8"  (1.727 m) and weight is 163 lb (73.9 kg). Her temperature is 97.3 F (36.3 C) (abnormal). Her blood pressure is 132/83 and her pulse is 99. Her respiration is 16 and oxygen saturation is 100%.  BMI: Estimated body mass index is 24.78 kg/m as calculated from the following:   Height as of this encounter: 5\' 8"  (1.727 m).   Weight as of this encounter: 163 lb (73.9 kg). Last encounter: 03/25/2023. Last  procedure: 04/20/2023.  Reason for encounter: post-procedure evaluation and assessment.    Post-procedure evaluation   Procedure: Lumbar Facet, Medial Branch Radiofrequency Ablation (RFA) #1  Laterality: Bilateral (-50)  Level: L3, L4, and L5 Medial Branch Level(s). These levels will denervate the L3-4 and L4-5 lumbar facet joints.  Imaging: Fluoroscopy-guided         Anesthesia: Local anesthesia (1-2% Lidocaine) DOS: 04/20/2023  Performed by: Edward Jolly, MD  Purpose: Therapeutic/Palliative Indications: Low back pain severe enough to impact quality of life or function. Indications: 1. Lumbar facet arthropathy (L4/5, L5/S1)   2. Spondylosis of lumbar joint   3. Chronic pain syndrome    Ms. Riling has been dealing with the above chronic pain for longer than three months and has either failed to respond, was unable to tolerate, or simply did not get enough benefit from other more conservative therapies including, but not limited to: 1. Over-the-counter medications 2. Anti-inflammatory medications 3. Muscle relaxants 4. Membrane stabilizers 5. Opioids 6. Physical therapy and/or chiropractic manipulation 7. Modalities (Heat, ice, etc.) 8. Invasive techniques such as nerve blocks. Ms. Hargreaves has attained more than 50% relief of the pain from a series of diagnostic injections conducted in separate occasions.  Pain Score: Pre-procedure: 10-Worst pain ever/10 Post-procedure: 7/10     Effectiveness:  Initial hour after procedure: 100 %  Subsequent 4-6 hours post-procedure: 100 %  Analgesia past initial 6 hours: 0 %  Ongoing improvement:  Analgesic:  0%    ROS  Constitutional: Denies any fever or chills Gastrointestinal: No reported hemesis, hematochezia, vomiting, or acute GI distress Musculoskeletal:  right knee pain and lumbar spine pain Neurological: No reported episodes of acute onset apraxia, aphasia, dysarthria, agnosia, amnesia,  paralysis, loss of coordination, or loss  of consciousness  Medication Review  DULoxetine HCl, Probiotic (Lactobacillus), acetaminophen, alendronate, beta carotene w/minerals, bimatoprost, dicyclomine, donepezil, estradiol, fexofenadine, gabapentin, latanoprost, levothyroxine, lovastatin, melatonin, mirabegron ER, montelukast, nortriptyline, omeprazole, polyethylene glycol powder, predniSONE, pregabalin, simvastatin, and trimethoprim  History Review  Allergy: Ms. Labo is allergic to macrobid [nitrofurantoin], codeine, hydrocodone, oxycodone, peanut-containing drug products, peanuts [peanut oil], penicillins, and tramadol. Drug: Ms. Kinkade  has no history on file for drug use. Alcohol:  reports no history of alcohol use. Tobacco:  reports that she has never smoked. She has never used smokeless tobacco. Social: Ms. Krolikowski  reports that she has never smoked. She has never used smokeless tobacco. She reports that she does not drink alcohol. Medical:  has a past medical history of Benign essential HTN (09/21/2015), Cancer (HCC), Neuropathy, Seizures (HCC), and Thyroid disease. Surgical: Ms. Baechle  has a past surgical history that includes Abdominal hysterectomy; Breast biopsy (Bilateral); Breast excisional biopsy (Right); and Knee arthroscopy w/ lateral retinacular repair. Family: family history includes Brain cancer in her sister; Breast cancer in her maternal aunt; Breast cancer (age of onset: 62) in her maternal aunt; Breast cancer (age of onset: 6) in her mother; Breast cancer (age of onset: 86) in her maternal uncle; Cancer in her brother and sister; Tracheal cancer in her father.  Laboratory Chemistry Profile   Renal Lab Results  Component Value Date   BUN 23 (H) 09/14/2015   CREATININE 1.20 (H) 12/26/2022   GFRAA 77 09/12/2020   GFRNONAA 67 09/12/2020    Hepatic Lab Results  Component Value Date   AST 19 11/26/2015   ALT 15 11/26/2015   ALBUMIN 4.7 11/26/2015   ALKPHOS 64 11/26/2015    Electrolytes Lab Results   Component Value Date   NA 141 09/14/2015   K 3.5 09/14/2015   CL 107 09/14/2015   CALCIUM 9.7 09/14/2015   MG 2.3 01/20/2023    Bone No results found for: "VD25OH", "VD125OH2TOT", "ZO1096EA5", "WU9811BJ4", "25OHVITD1", "25OHVITD2", "25OHVITD3", "TESTOFREE", "TESTOSTERONE"  Inflammation (CRP: Acute Phase) (ESR: Chronic Phase) No results found for: "CRP", "ESRSEDRATE", "LATICACIDVEN"       Note: Above Lab results reviewed.  Recent Imaging Review  NM Bone Scan 3 Phase CLINICAL DATA:  RIGHT knee arthroplasty.  Knee pain.  EXAM: NUCLEAR MEDICINE 3-PHASE BONE SCAN  TECHNIQUE: Radionuclide angiographic images, immediate static blood pool images, and 3-hour delayed static images were obtained of the knees after intravenous injection of radiopharmaceutical.  RADIOPHARMACEUTICALS:  20.0 mCi Tc-56m MDP IV  COMPARISON:  Plain film 04/18/2019  FINDINGS: Vascular phase: Increased vascular flow to the medial aspect of the RIGHT knee.  Blood pool phase: Asymmetric increased blood pool activity the medial RIGHT knee. This matches the hyperemia in the vascular section  Delayed phase: Mild periprosthetic uptake associated with the LEFT and RIGHT knee prosthetic.  IMPRESSION: Concern for loosening infection of the RIGHT knee prosthetic along the medial femoral condyle. Activity greater on the vascular phase which could indicate focal synovitis.  Electronically Signed   By: Genevive Bi M.D.   On: 05/13/2023 10:10 Note: Reviewed        Physical Exam  General appearance: Well nourished, well developed, and well hydrated. In no apparent acute distress Mental status: Alert, oriented x 3 (person, place, & time)       Respiratory: No evidence of acute respiratory distress Eyes: PERLA Vitals: BP 132/83   Pulse 99   Temp (!) 97.3 F (36.3 C)   Resp 16  Ht 5\' 8"  (1.727 m)   Wt 163 lb (73.9 kg)   SpO2 100%   BMI 24.78 kg/m  BMI: Estimated body mass index is 24.78 kg/m as  calculated from the following:   Height as of this encounter: 5\' 8"  (1.727 m).   Weight as of this encounter: 163 lb (73.9 kg). Ideal: Ideal body weight: 63.9 kg (140 lb 14 oz) Adjusted ideal body weight: 67.9 kg (149 lb 11.6 oz)  Right knee pain, worse with weightbearing  Assessment   Diagnosis Status  1. Primary osteoarthritis of right knee   2. Chronic knee pain after total replacement of right knee joint   3. Chronic pain of right knee   4. Lumbar facet arthropathy (L4/5, L5/S1)   5. Spondylosis of lumbar joint   6. Lumbar disc herniation (L2/L3)   7. Chronic pain syndrome    Having a Flare-up Having a Flare-up Having a Flare-up    Plan of Care  Limited response to RFA unfortunately.  Complaining of increased musculoskeletal pain in her lower back.  She is also complaining of muscle cramps.  We discussed lumbar trigger point injections and intramuscular Robaxin today.  Patient also endorses right knee pain that is worse with weightbearing.  She has a history of right knee replacement done over 15 years ago and is having fairly severe right knee pain.  She has seen orthopedics and has had a right knee steroid injection done.  We discussed a right knee genicular nerve block.  Risk and benefits reviewed and patient elected proceed with that.  Pharmacotherapy (Medications Ordered): Meds ordered this encounter  Medications   methocarbamol (ROBAXIN) injection 200 mg   Orders:  Orders Placed This Encounter  Procedures   TRIGGER POINT INJECTION    Standing Status:   Future    Standing Expiration Date:   08/26/2023    Scheduling Instructions:     Lumbar paraspinal TPI    Order Specific Question:   Where will this procedure be performed?    Answer:   [facility]   GENICULAR NERVE BLOCK    Indication(s):  Sub-acute knee pain    Standing Status:   Future    Standing Expiration Date:   08/26/2023    Scheduling Instructions:     Side: RIGHT     Sedation: without      Timeframe: As soon as schedule allows    Order Specific Question:   Where will this procedure be performed?    Answer:   [facility]   Follow-up plan:   Return in about 2 weeks (around 06/10/2023) for Right GNB + lumbar TPI, in clinic NS.      Finds benefit with bilateral sacroiliac joint injection, previous one 03/14/2019 and then second 1 05/25/2019.  Also finds lumbar epidural steroid injections for when she has sciatic flare, consider repeating.  Previous one performed on 10/13/2018 at L2-L3.  Status post bilateral C4, C5, C6, C7 cervical facet medial branch nerve block on 03/05/2020, 05/21/20 for cervical facet arthropathy. Bilateral greater occipital nerve block 08/13/2020 helped significantly, repeat as needed.              Recent Visits Date Type Provider Dept  04/20/23 Procedure visit Edward Jolly, MD [facility]  03/25/23 Office Visit Edward Jolly, MD [facility]  Showing recent visits within past 90 days and meeting all other requirements Today's Visits Date Type Provider Dept  05/27/23 Office Visit Edward Jolly, MD [facility]  Showing  today's visits and meeting all other requirements Future Appointments No visits were found meeting these conditions. Showing future appointments within next 90 days and meeting all other requirements  I discussed the assessment and treatment plan with the patient. The patient was provided an opportunity to ask questions and all were answered. The patient agreed with the plan and demonstrated an understanding of the instructions.  Patient advised to call back or seek an in-person evaluation if the symptoms or condition worsens.  Duration of encounter: .  Total time on encounter, as per AMA guidelines included both the face-to-face and non-face-to-face time personally spent by the physician and/or other qualified health care professional(s) on the day of the encounter (includes time in activities  that require the physician or other qualified health care professional and does not include time in activities normally performed by clinical staff). Physician's time may include the following activities when performed: Preparing to see the patient (e.g., pre-charting review of records, searching for previously ordered imaging, lab work, and nerve conduction tests) Review of prior analgesic pharmacotherapies. Reviewing PMP Interpreting ordered tests (e.g., lab work, imaging, nerve conduction tests) Performing post-procedure evaluations, including interpretation of diagnostic procedures Obtaining and/or reviewing separately obtained history Performing a medically appropriate examination and/or evaluation Counseling and educating the patient/family/caregiver Ordering medications, tests, or procedures Referring and communicating with other health care professionals (when not separately reported) Documenting clinical information in the electronic or other health record Independently interpreting results (not separately reported) and communicating results to the patient/ family/caregiver Care coordination (not separately reported)  Note by: Edward Jolly, MD Date: 05/27/2023; Time: 2:38 PM

## 2023-05-27 NOTE — Patient Instructions (Addendum)
GENERAL RISKS AND COMPLICATIONS ° °What are the risk, side effects and possible complications? °Generally speaking, most procedures are safe.  However, with any procedure there are risks, side effects, and the possibility of complications.  The risks and complications are dependent upon the sites that are lesioned, or the type of nerve block to be performed.  The closer the procedure is to the spine, the more serious the risks are.  Great care is taken when placing the radio frequency needles, block needles or lesioning probes, but sometimes complications can occur. °Infection: Any time there is an injection through the skin, there is a risk of infection.  This is why sterile conditions are used for these blocks.  There are four possible types of infection. °Localized skin infection. °Central Nervous System Infection-This can be in the form of Meningitis, which can be deadly. °Epidural Infections-This can be in the form of an epidural abscess, which can cause pressure inside of the spine, causing compression of the spinal cord with subsequent paralysis. This would require an emergency surgery to decompress, and there are no guarantees that the patient would recover from the paralysis. °Discitis-This is an infection of the intervertebral discs.  It occurs in about 1% of discography procedures.  It is difficult to treat and it may lead to surgery. ° °      2. Pain: the needles have to go through skin and soft tissues, will cause soreness. °      3. Damage to internal structures:  The nerves to be lesioned may be near blood vessels or   ° other nerves which can be potentially damaged. °      4. Bleeding: Bleeding is more common if the patient is taking blood thinners such as  aspirin, Coumadin, Ticiid, Plavix, etc., or if he/she have some genetic predisposition  such as hemophilia. Bleeding into the spinal canal can cause compression of the spinal  cord with subsequent paralysis.  This would require an emergency  surgery to  decompress and there are no guarantees that the patient would recover from the  paralysis. °      5. Pneumothorax:  Puncturing of a lung is a possibility, every time a needle is introduced in  the area of the chest or upper back.  Pneumothorax refers to free air around the  collapsed lung(s), inside of the thoracic cavity (chest cavity).  Another two possible  complications related to a similar event would include: Hemothorax and Chylothorax.   These are variations of the Pneumothorax, where instead of air around the collapsed  lung(s), you may have blood or chyle, respectively. °      6. Spinal headaches: They may occur with any procedures in the area of the spine. °      7. Persistent CSF (Cerebro-Spinal Fluid) leakage: This is a rare problem, but may occur  with prolonged intrathecal or epidural catheters either due to the formation of a fistulous  track or a dural tear. °      8. Nerve damage: By working so close to the spinal cord, there is always a possibility of  nerve damage, which could be as serious as a permanent spinal cord injury with  paralysis. °      9. Death:  Although rare, severe deadly allergic reactions known as "Anaphylactic  reaction" can occur to any of the medications used. °     10. Worsening of the symptoms:  We can always make thing worse. ° °What are the chances   of something like this happening? Chances of any of this occuring are extremely low.  By statistics, you have more of a chance of getting killed in a motor vehicle accident: while driving to the hospital than any of the above occurring .  Nevertheless, you should be aware that they are possibilities.  In general, it is similar to taking a shower.  Everybody knows that you can slip, hit your head and get killed.  Does that mean that you should not shower again?  Nevertheless always keep in mind that statistics do not mean anything if you happen to be on the wrong side of them.  Even if a procedure has a 1 (one) in a  1,000,000 (million) chance of going wrong, it you happen to be that one..Also, keep in mind that by statistics, you have more of a chance of having something go wrong when taking medications.  Who should not have this procedure? If you are on a blood thinning medication (e.g. Coumadin, Plavix, see list of "Blood Thinners"), or if you have an active infection going on, you should not have the procedure.  If you are taking any blood thinners, please inform your physician.  How should I prepare for this procedure? Do not eat or drink anything at least six hours prior to the procedure. Bring a driver with you .  It cannot be a taxi. Come accompanied by an adult that can drive you back, and that is strong enough to help you if your legs get weak or numb from the local anesthetic. Take all of your medicines the morning of the procedure with just enough water to swallow them. If you have diabetes, make sure that you are scheduled to have your procedure done first thing in the morning, whenever possible. If you have diabetes, take only half of your insulin dose and notify our nurse that you have done so as soon as you arrive at the clinic. If you are diabetic, but only take blood sugar pills (oral hypoglycemic), then do not take them on the morning of your procedure.  You may take them after you have had the procedure. Do not take aspirin or any aspirin-containing medications, at least eleven (11) days prior to the procedure.  They may prolong bleeding. Wear loose fitting clothing that may be easy to take off and that you would not mind if it got stained with Betadine or blood. Do not wear any jewelry or perfume Remove any nail coloring.  It will interfere with some of our monitoring equipment.  NOTE: Remember that this is not meant to be interpreted as a complete list of all possible complications.  Unforeseen problems may occur.  BLOOD THINNERS The following drugs contain aspirin or other products,  which can cause increased bleeding during surgery and should not be taken for 2 weeks prior to and 1 week after surgery.  If you should need take something for relief of minor pain, you may take acetaminophen which is found in Tylenol,m Datril, Anacin-3 and Panadol. It is not blood thinner. The products listed below are.  Do not take any of the products listed below in addition to any listed on your instruction sheet.  A.P.C or A.P.C with Codeine Codeine Phosphate Capsules #3 Ibuprofen Ridaura  ABC compound Congesprin Imuran rimadil  Advil Cope Indocin Robaxisal  Alka-Seltzer Effervescent Pain Reliever and Antacid Coricidin or Coricidin-D  Indomethacin Rufen  Alka-Seltzer plus Cold Medicine Cosprin Ketoprofen S-A-C Tablets  Anacin Analgesic Tablets or Capsules Coumadin   Korlgesic Salflex  Anacin Extra Strength Analgesic tablets or capsules CP-2 Tablets Lanoril Salicylate  Anaprox Cuprimine Capsules Levenox Salocol  Anexsia-D Dalteparin Magan Salsalate  Anodynos Darvon compound Magnesium Salicylate Sine-off  Ansaid Dasin Capsules Magsal Sodium Salicylate  Anturane Depen Capsules Marnal Soma  APF Arthritis pain formula Dewitt's Pills Measurin Stanback  Argesic Dia-Gesic Meclofenamic Sulfinpyrazone  Arthritis Bayer Timed Release Aspirin Diclofenac Meclomen Sulindac  Arthritis pain formula Anacin Dicumarol Medipren Supac  Analgesic (Safety coated) Arthralgen Diffunasal Mefanamic Suprofen  Arthritis Strength Bufferin Dihydrocodeine Mepro Compound Suprol  Arthropan liquid Dopirydamole Methcarbomol with Aspirin Synalgos  ASA tablets/Enseals Disalcid Micrainin Tagament  Ascriptin Doan's Midol Talwin  Ascriptin A/D Dolene Mobidin Tanderil  Ascriptin Extra Strength Dolobid Moblgesic Ticlid  Ascriptin with Codeine Doloprin or Doloprin with Codeine Momentum Tolectin  Asperbuf Duoprin Mono-gesic Trendar  Aspergum Duradyne Motrin or Motrin IB Triminicin  Aspirin plain, buffered or enteric coated  Durasal Myochrisine Trigesic  Aspirin Suppositories Easprin Nalfon Trillsate  Aspirin with Codeine Ecotrin Regular or Extra Strength Naprosyn Uracel  Atromid-S Efficin Naproxen Ursinus  Auranofin Capsules Elmiron Neocylate Vanquish  Axotal Emagrin Norgesic Verin  Azathioprine Empirin or Empirin with Codeine Normiflo Vitamin E  Azolid Emprazil Nuprin Voltaren  Bayer Aspirin plain, buffered or children's or timed BC Tablets or powders Encaprin Orgaran Warfarin Sodium  Buff-a-Comp Enoxaparin Orudis Zorpin  Buff-a-Comp with Codeine Equegesic Os-Cal-Gesic   Buffaprin Excedrin plain, buffered or Extra Strength Oxalid   Bufferin Arthritis Strength Feldene Oxphenbutazone   Bufferin plain or Extra Strength Feldene Capsules Oxycodone with Aspirin   Bufferin with Codeine Fenoprofen Fenoprofen Pabalate or Pabalate-SF   Buffets II Flogesic Panagesic   Buffinol plain or Extra Strength Florinal or Florinal with Codeine Panwarfarin   Buf-Tabs Flurbiprofen Penicillamine   Butalbital Compound Four-way cold tablets Penicillin   Butazolidin Fragmin Pepto-Bismol   Carbenicillin Geminisyn Percodan   Carna Arthritis Reliever Geopen Persantine   Carprofen Gold's salt Persistin   Chloramphenicol Goody's Phenylbutazone   Chloromycetin Haltrain Piroxlcam   Clmetidine heparin Plaquenil   Cllnoril Hyco-pap Ponstel   Clofibrate Hydroxy chloroquine Propoxyphen         Before stopping any of these medications, be sure to consult the physician who ordered them.  Some, such as Coumadin (Warfarin) are ordered to prevent or treat serious conditions such as "deep thrombosis", "pumonary embolisms", and other heart problems.  The amount of time that you may need off of the medication may also vary with the medication and the reason for which you were taking it.  If you are taking any of these medications, please make sure you notify your pain physician before you undergo any procedures.         Trigger Point  Injections Patient Information  Description: Trigger points are areas of muscle sensitive to touch which cause pain with movement, sometimes felt some distance from the site of palpation.  Usually the muscle containing these trigger points if felt as a tight band or knot.   The area of maximum tenderness or trigger point is identified, and after antiseptic preparation of the skin, a small needle is placed into this site.  Reproduction of the pain often occurs and numbing medicine (local anesthetic) is injected into the site, sometimes along with steroid preparation.  The entire block usually lasts less than 5 minutes.  Conditions which may be treated by trigger points:  Muscular pain and spasm Nerve irritation  Preparation for the injection:  Do not eat any solid food or dairy products within 8 hours  of your appointment. You may drink clear liquids up to 3 hours before appointment.  Clear liquids include water, black coffee, juice or soda.  No milk or cream please. You may take your regular medications, including pain medications, with a sip of water before your appointment.  Diabetics should hold regular insulin ( if take separately) and take 1/2 normal NPH dose the morning of the procedure.  Carry some sugar containing items with you to your appointment. A driver must accompany you and be prepared to drive you home after your procedure.  Bring all your current medications with you. An IV may be inserted and sedation may be given at the discretion of the physician.  A blood pressure cuff, EKG, and other monitors will often be applied during the procedure.  Some patients may need to have extra oxygen administered for a short period. You will be asked to provide medical information, including your allergies and medications, prior to the procedure.  We must know immediately if you are taking blood thinners (like Coumadin/Warfarin) or if you are allergic to IV iodine contrast (dye).  We must know if  you could possibly be pregnant.  Possible side-effects:  Bleeding from needle site Infection (rare, may require surgery) Nerve injury (rare) Numbness & tingling (temporary) Punctured lung (if injection around chest) Light-headedness (temporary) Pain at injection site (several days) Decreased blood pressure (rare, temporary) Weakness in arm/leg (temporary)  Call if you experience:  Hive or difficulty breathing (go to the emergency room) Inflammation or drainage at the injection site(s)  Please note:  Although the local anesthetic injected can often make your painful muscle feel good for several hours after the injection, the pain may return.  It takes 3-7 days for steroids to work.  You may not notice any pain relief for at least one week.  If effective, we will often do a series of injections spaced 3-6 weeks apart to maximally decrease your pain.  If you have any questions please call [phone] [facility] Clinic[facility] Patient Information  Description: Specific nerve roots exit the spinal canal and these nerves can be compressed and inflamed by a bulging disc and bone spurs.  By injecting steroids on the nerve root, we can potentially decrease the inflammation surrounding these nerves, which often leads to decreased pain.  Also, by injecting local anesthesia on the nerve root, this can provide [place] helpful information to give to your referring doctor if it decreases your pain.  Selective nerve root blocks can be done along the spine from the neck to the low back depending on the location of your pain.   After numbing the skin with local anesthesia, a small needle is passed to the nerve root and the position of the needle is verified using x-ray pictures.  After the needle is in correct position, we then deposit the medication.  You may experience a pressure sensation while this is being done.  The entire block usually lasts less than 15  minutes.  Conditions that may be treated with selective nerve root blocks: Low back and leg pain Spinal stenosis Diagnostic block prior to potential surgery Neck and arm pain Post laminectomy syndrome  Preparation for the injection:  Do not eat any solid food or dairy products within 8 hours of your appointment. You may drink clear liquids up to 3 hours before an appointment.  Clear liquids include water, black coffee, juice or soda.  No milk or cream please. You may  take your regular medications, including pain medications, with a sip of water before your appointment.  Diabetics should hold regular insulin (if taken separately) and take 1/2 normal NPH dose the morning of the procedure.  Carry some sugar containing items with you to your appointment. A driver must accompany you and be prepared to drive you home after your procedure. Bring all your current medications with you. An IV may be inserted and sedation may be given at the discretion of the physician. A blood pressure cuff, EKG, and other monitors will often be applied during the procedure.  Some patients may need to have extra oxygen administered for a short period. You will be asked to provide medical information, including allergies, prior to the procedure.  We must know immediately if you are taking blood  Thinners (like Coumadin) or if you are allergic to IV iodine contrast (dye).  Possible side-effects: All are usually temporary Bleeding from needle site Light headedness Numbness and tingling Decreased blood pressure Weakness in arms/legs Pressure sensation in back/neck Pain at injection site (several days)  Possible complications: All are extremely rare Infection Nerve injury Spinal headache (a headache wore with upright position)  Call if you experience: Fever/chills associated with headache or increased back/neck pain Headache worsened by an upright position New onset weakness or numbness of an extremity  below the injection site Hives or difficulty breathing (go to the emergency room) Inflammation or drainage at the injection site(s) Severe back/neck pain greater than usual New symptoms which are concerning to you  Please note:  Although the local anesthetic injected can often make your back or neck feel good for several hours after the injection the pain will likely return.  It takes 3-5 days for steroids to work on the nerve root. You may not notice any pain relief for at least one week.  If effective, we will often do a series of 3 injections spaced 3-6 weeks apart to maximally decrease your pain.    If you have any questions, please call [phone] [facility]

## 2023-05-27 NOTE — Progress Notes (Signed)
Safety precautions to be maintained throughout the outpatient stay will include: orient to surroundings, keep bed in low position, maintain call bell within reach at all times, provide assistance with transfer out of bed and ambulation.  

## 2023-05-29 DIAGNOSIS — J40 Bronchitis, not specified as acute or chronic: Secondary | ICD-10-CM | POA: Diagnosis not present

## 2023-06-01 ENCOUNTER — Ambulatory Visit: Payer: PPO | Admitting: Student in an Organized Health Care Education/Training Program

## 2023-06-10 ENCOUNTER — Encounter: Payer: Self-pay | Admitting: Student in an Organized Health Care Education/Training Program

## 2023-06-10 ENCOUNTER — Ambulatory Visit
Admission: RE | Admit: 2023-06-10 | Discharge: 2023-06-10 | Disposition: A | Discharge status: Home / Self Care | Payer: PPO | Source: Ambulatory Visit | Attending: Student in an Organized Health Care Education/Training Program | Admitting: Student in an Organized Health Care Education/Training Program

## 2023-06-10 ENCOUNTER — Ambulatory Visit
Payer: PPO | Attending: Student in an Organized Health Care Education/Training Program | Admitting: Student in an Organized Health Care Education/Training Program

## 2023-06-10 VITALS — BP 142/72 | HR 62 | Temp 97.5°F | Resp 15 | Ht 68.0 in | Wt 165.0 lb

## 2023-06-10 DIAGNOSIS — G8929 Other chronic pain: Secondary | ICD-10-CM | POA: Diagnosis not present

## 2023-06-10 DIAGNOSIS — Z96651 Presence of right artificial knee joint: Secondary | ICD-10-CM | POA: Diagnosis not present

## 2023-06-10 DIAGNOSIS — M25561 Pain in right knee: Secondary | ICD-10-CM | POA: Insufficient documentation

## 2023-06-10 DIAGNOSIS — M7918 Myalgia, other site: Secondary | ICD-10-CM | POA: Diagnosis not present

## 2023-06-10 DIAGNOSIS — M1711 Unilateral primary osteoarthritis, right knee: Secondary | ICD-10-CM

## 2023-06-10 MED ORDER — LIDOCAINE HCL 2 % IJ SOLN
20.0000 mL | Freq: Once | INTRAMUSCULAR | Status: AC
  Administered 2023-06-10: 400 mg
  Filled 2023-06-10: qty 20

## 2023-06-10 MED ORDER — ROPIVACAINE HCL 2 MG/ML IJ SOLN
9.0000 mL | Freq: Once | INTRAMUSCULAR | Status: AC
  Administered 2023-06-10: 9 mL via PERINEURAL
  Filled 2023-06-10: qty 20

## 2023-06-10 MED ORDER — METHYLPREDNISOLONE ACETATE 80 MG/ML IJ SUSP
80.0000 mg | Freq: Once | INTRAMUSCULAR | Status: AC
  Administered 2023-06-10: 80 mg via INTRA_ARTICULAR
  Filled 2023-06-10: qty 1

## 2023-06-10 NOTE — Progress Notes (Signed)
PROVIDER NOTE: Interpretation of information contained herein should be left to medically-trained personnel. Specific patient instructions are provided elsewhere under "Patient Instructions" section of medical record. This document was created in part using STT-dictation technology, any transcriptional errors that may result from this process are unintentional.  Patient: Sarah [name] Type: Established DOB: 18-Apr-1932 MRN: [id number] PCP: Rayetta Humphrey, MD  Service: Procedure DOS: 06/10/2023 Setting: Ambulatory Location: Ambulatory outpatient facility Delivery: Face-to-face Provider: Edward Jolly, MD Specialty: Interventional Pain Management Specialty designation: 09 Location: Outpatient facility Ref. Prov.: Rayetta Humphrey, MD       Interventional Therapy   Procedure:               Type: Genicular Nerves Block (Superolateral, Superomedial, and Inferomedial Genicular Nerves)  #1 and Lumbar TPI  Laterality: Right (-RT)  Level: Superior and inferior to the knee joint.  Imaging: Fluoroscopic guidance Anesthesia: Local anesthesia (1-2% Lidocaine) Anxiolysis: None                 Sedation: No Sedation                       DOS: 06/10/2023  Performed by: Edward Jolly, MD  Purpose: Diagnostic/Therapeutic Indications: Chronic knee pain severe enough to impact quality of life or function. Rationale (medical necessity): procedure needed and proper for the diagnosis and/or treatment of Ms. Valin's medical symptoms and needs. 1. Primary osteoarthritis of right knee   2. Chronic knee pain after total replacement of right knee joint   3. Chronic pain of right knee    NAS-11 Pain score:   Pre-procedure: 10-Worst pain ever/10   Post-procedure: 10-Worst pain ever/10     Target: For Genicular Nerve block(s), the targets are: the superolateral genicular nerve, located in the lateral distal portion of the femoral shaft as it curves to form the lateral epicondyle, in the region of the  distal femoral metaphysis; the superomedial genicular nerve, located in the medial distal portion of the femoral shaft as it curves to form the medial epicondyle; and the inferomedial genicular nerve, located in the medial, proximal portion of the tibial shaft, as it curves to form the medial epicondyle, in the region of the proximal tibial metaphysis.  Location: Superolateral, Superomedial, and Inferomedial aspects of knee joint.  Region: Lateral, Anterior, and Medial aspects of the knee joint, above and below the knee joint proper. Approach: Percutaneous  Type of procedure: Percutaneous perineural nerve block. The genicular nerve block is a motor-sparing technique that anesthetizes the sensory terminal branches innervating the knee joint, resulting in anesthesia of the anterior compartment of the knee. The distribution of anesthesia of each nerve is mostly in the corresponding quadrant.  Neuroanatomy: The superolateral genicular nerve (SLGN) courses around the femur shaft to pass between the vastus lateralis and the lateral epicondyle. It accompanies the superior lateral genicular artery. The superomedial genicular nerve (SMGN) courses around the femur shaft, following the superior medial genicular artery, to pass between the adductor magnus tendon and the medial epicondyle below the vastus medialis. The inferolateral genicular nerve (ILGN) courses around the tibial lateral epicondyle deep to the lateral collateral ligament, following the inferior lateral genicular artery, superior of the fibula head. The inferomedial genicular nerve (IMGN) courses horizontally below the medial collateral ligament between the tibial medial epicondyle and the insertion of the collateral ligament. It accompanies the inferior medial genicular artery. The recurrent peroneal nerve originates in the inferior popliteal region from the common peroneal nerve and courses horizontally  around the fibula to pass just inferior of the  fibula head and travel superior to the anterolateral tibial epicondyle. It accompanies the recurrent tibial artery.  Position / Prep / Materials:  Position: Supine, Modified Fowler's position with pillows under the targeted knee(s). The patient is placed in a supine position with the knee slightly flexed by placing a pillow in the popliteal fossa. Prep solution: DuraPrep (Iodine Povacrylex [0.7% available iodine] and Isopropyl Alcohol, 74% w/w) Prep Area: Entire knee area, from mid-thigh to mid-shin, lateral, anterior, and medial aspects. Materials:  Tray: Block Needle(s):  Type: Spinal  Gauge (G): 22  Length: 3.5-in  Qty: 3  H&P (Pre-op Assessment):  Ms. Corter is a 89 y.o. (year old), female patient, seen today for interventional treatment. She  has a past surgical history that includes Abdominal hysterectomy; Breast biopsy (Bilateral); Breast excisional biopsy (Right); and Knee arthroscopy w/ lateral retinacular repair. Ms. Enoch has a current medication list which includes the following prescription(s): acetaminophen, alendronate, beta carotene w/minerals, bimatoprost, brimonidine, dicyclomine, donepezil, dorzolamide, duloxetine hcl, estradiol, fexofenadine, latanoprost, levothyroxine, lovastatin, lumigan, melatonin, mirabegron er, montelukast, nortriptyline, polyethylene glycol powder, prednisone, pregabalin, probiotic (lactobacillus), simvastatin, trimethoprim, gabapentin, and omeprazole. Her primarily concern today is the Back Pain (Upper and lower back)  Initial Vital Signs:  Pulse/HCG Rate: 62  Temp: (!) 97.5 F (36.4 C) Resp: 16 BP: 129/77 SpO2: 99 %  BMI: Estimated body mass index is 25.09 kg/m as calculated from the following:   Height as of this encounter: 5\' 8"  (1.727 m).   Weight as of this encounter: 165 lb (74.8 kg).  Risk Assessment: Allergies: Reviewed. She is allergic to macrobid [nitrofurantoin], codeine, hydrocodone, oxycodone, peanut-containing drug products,  peanuts [peanut oil], penicillins, and tramadol.  Allergy Precautions: None required Coagulopathies: Reviewed. None identified.  Blood-thinner therapy: None at this time Active Infection(s): Reviewed. None identified. Ms. Morgenstern is afebrile  Site Confirmation: Ms. Maris was asked to confirm the procedure and laterality before marking the site Procedure checklist: Completed Consent: Before the procedure and under the influence of no sedative(s), amnesic(s), or anxiolytics, the patient was informed of the treatment options, risks and possible complications. To fulfill our ethical and legal obligations, as recommended by the American Medical Association's Code of Ethics, I have informed the patient of my clinical impression; the nature and purpose of the treatment or procedure; the risks, benefits, and possible complications of the intervention; the alternatives, including doing nothing; the risk(s) and benefit(s) of the alternative treatment(s) or procedure(s); and the risk(s) and benefit(s) of doing nothing. The patient was provided information about the general risks and possible complications associated with the procedure. These may include, but are not limited to: failure to achieve desired goals, infection, bleeding, organ or nerve damage, allergic reactions, paralysis, and death. In addition, the patient was informed of those risks and complications associated to the procedure, such as failure to decrease pain; infection; bleeding; organ or nerve damage with subsequent damage to sensory, motor, and/or autonomic systems, resulting in permanent pain, numbness, and/or weakness of one or several areas of the body; allergic reactions; (i.e.: anaphylactic reaction); and/or death. Furthermore, the patient was informed of those risks and complications associated with the medications. These include, but are not limited to: allergic reactions (i.e.: anaphylactic or anaphylactoid reaction(s)); adrenal axis  suppression; blood sugar elevation that in diabetics may result in ketoacidosis or comma; water retention that in patients with history of congestive heart failure may result in shortness of breath, pulmonary edema, and decompensation with resultant heart  failure; weight gain; swelling or edema; medication-induced neural toxicity; particulate matter embolism and blood vessel occlusion with resultant organ, and/or nervous system infarction; and/or aseptic necrosis of one or more joints. Finally, the patient was informed that Medicine is not an exact science; therefore, there is also the possibility of unforeseen or unpredictable risks and/or possible complications that may result in a catastrophic outcome. The patient indicated having understood very clearly. We have given the patient no guarantees and we have made no promises. Enough time was given to the patient to ask questions, all of which were answered to the patient's satisfaction. Ms. Defrancisco has indicated that she wanted to continue with the procedure. Attestation: I, the ordering provider, attest that I have discussed with the patient the benefits, risks, side-effects, alternatives, likelihood of achieving goals, and potential problems during recovery for the procedure that I have provided informed consent. Date  Time: 06/10/2023  8:25 AM  Pre-Procedure Preparation:  Monitoring: As per clinic protocol. Respiration, ETCO2, SpO2, BP, heart rate and rhythm monitor placed and checked for adequate function Safety Precautions: Patient was assessed for positional comfort and pressure points before starting the procedure. Time-out: I initiated and conducted the "Time-out" before starting the procedure, as per protocol. The patient was asked to participate by confirming the accuracy of the "Time Out" information. Verification of the correct person, site, and procedure were performed and confirmed by me, the nursing staff, and the patient. "Time-out" conducted  as per Joint Commission's Universal Protocol (UP.01.01.01). Time: 0924 Start Time: 0924 hrs.  Description/Narrative of Procedure:          Rationale (medical necessity): procedure needed and proper for the diagnosis and/or treatment of the patient's medical symptoms and needs. Procedural Technique Safety Precautions: Aspiration looking for blood return was conducted prior to all injections. At no point did we inject any substances, as a needle was being advanced. No attempts were made at seeking any paresthesias. Safe injection practices and needle disposal techniques used. Medications properly checked for expiration dates. SDV (single dose vial) medications used. Description of the Procedure: Protocol guidelines were followed. The patient was assisted into a comfortable position. The target area was identified and the area prepped in the usual manner. Skin & deeper tissues infiltrated with local anesthetic. Appropriate amount of time allowed to pass for local anesthetics to take effect. The procedure needles were then advanced to the target area. Proper needle placement secured. Negative aspiration confirmed. Solution injected in intermittent fashion, asking for systemic symptoms every 0.5cc of injectate. The needles were then removed and the area cleansed, making sure to leave some of the prepping solution back to take advantage of its long term bactericidal properties.   10 cc solution made of 9 cc of 0.2% ropivacaine, 1 cc of methylprednisolone, 80 mg/cc.  3.3 cc injected for each of the genicular nerves.  Afterwards lumbar trigger point injections were done with 0.2% ropivacaine.  Needling was performed.  1 cc of ropivacaine was injected into each trigger point after needling.              Vitals:   06/10/23 0833 06/10/23 0910 06/10/23 0920 06/10/23 0930  BP: 129/77 (!) 123/95 (!) 119/105 (!) 142/72  Pulse: 62 62 60 62  Resp:  16 13 15   Temp: (!) 97.5 F (36.4 C)     SpO2: 99% 100%  100% 100%  Weight: 165 lb (74.8 kg)     Height: 5\' 8"  (1.727 m)        Start  Time: 0924 hrs. End Time: 0930 hrs.  Imaging Guidance (Non-Spinal):          Type of Imaging Technique: Fluoroscopy Guidance (Non-Spinal) Indication(s): Assistance in needle guidance and placement for procedures requiring needle placement in or near specific anatomical locations not easily accessible without such assistance. Exposure Time: Please see nurses notes. Contrast: None used. Fluoroscopic Guidance: I was personally present during the use of fluoroscopy. "Tunnel Vision Technique" used to obtain the best possible view of the target area. Parallax error corrected before commencing the procedure. "Direction-depth-direction" technique used to introduce the needle under continuous pulsed fluoroscopy. Once target was reached, antero-posterior, oblique, and lateral fluoroscopic projection used confirm needle placement in all planes. Images permanently stored in EMR. Interpretation: No contrast injected. I personally interpreted the imaging intraoperatively. Adequate needle placement confirmed in multiple planes. Permanent images saved into the patient's record.  Post-operative Assessment:  Post-procedure Vital Signs:  Pulse/HCG Rate: 62  Temp: (!) 97.5 F (36.4 C) Resp: 15 BP: (!) 142/72 SpO2: 100 %  EBL: None  Complications: No immediate post-treatment complications observed by team, or reported by patient.  Note: The patient tolerated the entire procedure well. A repeat set of vitals were taken after the procedure and the patient was kept under observation following institutional policy, for this type of procedure. Post-procedural neurological assessment was performed, showing return to baseline, prior to discharge. The patient was provided with post-procedure discharge instructions, including a section on how to identify potential problems. Should any problems arise concerning this procedure, the patient was  given instructions to immediately contact us, at any time, without hesitation. In any case, we plan to contact the patient by telephone for a follow-up status report regarding this interventional procedure.  Comments:  No additional relevant information.  Plan of Care (POC)  Orders:  Orders Placed This Encounter  Procedures   DG PAIN CLINIC C-ARM 1-60 MIN NO REPORT    Intraoperative interpretation by procedural physician at [facility].    Standing Status:   Standing    Number of Occurrences:   1    Order Specific Question:   Reason for exam:    Answer:   Assistance in needle guidance and placement for procedures requiring needle placement in or near specific anatomical locations not easily accessible without such assistance.    Medications ordered for procedure: Meds ordered this encounter  Medications   lidocaine (XYLOCAINE) 2 % (with pres) injection 400 mg   methylPREDNISolone acetate (DEPO-MEDROL) injection 80 mg   ropivacaine (PF) 2 mg/mL (0.2%) (NAROPIN) injection 9 mL   Medications administered: We administered lidocaine, methylPREDNISolone acetate, and ropivacaine (PF) 2 mg/mL (0.2%).  See the medical record for exact dosing, route, and time of administration.  Follow-up plan:   Return in about 6 weeks (around 07/22/2023) for PPE, F2F.       Finds benefit with bilateral sacroiliac joint injection, previous one 03/14/2019 and then second 1 05/25/2019.  Also finds lumbar epidural steroid injections for when she has sciatic flare, consider repeating.  Previous one performed on 10/13/2018 at [facility].  Status post bilateral C4, C5, C6, C7 cervical facet medial branch nerve block on 03/05/2020, 05/21/20 for cervical facet arthropathy. Bilateral greater occipital nerve block 08/13/2020 helped significantly, repeat as needed.            Recent Visits Date Type Provider Dept  05/27/23 Office Visit Edward Jolly, MD [facility]  04/20/23 Procedure visit Edward Jolly,  MD [facility]  03/25/23 Office Visit  Edward Jolly, MD [facility]  Showing recent visits within past 90 days and meeting all other requirements Today's Visits Date Type Provider Dept  06/10/23 Procedure visit Edward Jolly, MD [facility]  Showing today's visits and meeting all other requirements Future Appointments Date Type Provider Dept  07/27/23 Appointment Edward Jolly, MD [facility]  Showing future appointments within next 90 days and meeting all other requirements  Disposition: Discharge home  Discharge (Date  Time): 06/10/2023; 0937 hrs.   Primary Care Physician: Rayetta Humphrey, MD Location: [facility] Note by: Edward Jolly, MD (TTS technology used. I apologize for any typographical errors that were not detected and corrected.) Date: 06/10/2023; Time: 12:04 PM  Disclaimer:  Medicine is not an Visual merchandiser. The only guarantee in medicine is that nothing is guaranteed. It is important to note that the decision to proceed with this intervention was based on the information collected from the patient. The Data and conclusions were drawn from the patient's questionnaire, the interview, and the physical examination. Because the information was provided in large part by the patient, it cannot be guaranteed that it has not been purposely or unconsciously manipulated. Every effort has been made to obtain as much relevant data as possible for this evaluation. It is important to note that the conclusions that lead to this procedure are derived in large part from the available data. Always take into account that the treatment will also be dependent on availability of resources and existing treatment guidelines, considered by other Pain Management Practitioners as being common knowledge and practice, at the time of the intervention. For Medico-Legal purposes, it is also important to point out that variation in procedural  techniques and pharmacological choices are the acceptable norm. The indications, contraindications, technique, and results of the above procedure should only be interpreted and judged by a Board-Certified Interventional Pain Specialist with extensive familiarity and expertise in the same exact procedure and technique.

## 2023-06-10 NOTE — Patient Instructions (Signed)

## 2023-06-10 NOTE — Progress Notes (Signed)
Safety precautions to be maintained throughout the outpatient stay will include: orient to surroundings, keep bed in low position, maintain call bell within reach at all times, provide assistance with transfer out of bed and ambulation.  

## 2023-06-11 ENCOUNTER — Telehealth: Payer: Self-pay

## 2023-06-11 NOTE — Telephone Encounter (Signed)
Post procedure follow up.  Patient states she is having some pain.  Instructed to give steroid a few days to start working but to call for any further questions or concerns.

## 2023-06-23 DIAGNOSIS — J309 Allergic rhinitis, unspecified: Secondary | ICD-10-CM | POA: Diagnosis not present

## 2023-06-23 DIAGNOSIS — E039 Hypothyroidism, unspecified: Secondary | ICD-10-CM | POA: Diagnosis not present

## 2023-06-23 DIAGNOSIS — J301 Allergic rhinitis due to pollen: Secondary | ICD-10-CM | POA: Diagnosis not present

## 2023-06-23 DIAGNOSIS — I129 Hypertensive chronic kidney disease with stage 1 through stage 4 chronic kidney disease, or unspecified chronic kidney disease: Secondary | ICD-10-CM | POA: Diagnosis not present

## 2023-06-23 DIAGNOSIS — N1831 Chronic kidney disease, stage 3a: Secondary | ICD-10-CM | POA: Diagnosis not present

## 2023-06-23 DIAGNOSIS — Z23 Encounter for immunization: Secondary | ICD-10-CM | POA: Diagnosis not present

## 2023-06-23 DIAGNOSIS — R7303 Prediabetes: Secondary | ICD-10-CM | POA: Diagnosis not present

## 2023-06-23 DIAGNOSIS — I10 Essential (primary) hypertension: Secondary | ICD-10-CM | POA: Diagnosis not present

## 2023-06-28 DIAGNOSIS — J40 Bronchitis, not specified as acute or chronic: Secondary | ICD-10-CM | POA: Diagnosis not present

## 2023-06-29 DIAGNOSIS — H40053 Ocular hypertension, bilateral: Secondary | ICD-10-CM | POA: Diagnosis not present

## 2023-06-29 DIAGNOSIS — M3501 Sicca syndrome with keratoconjunctivitis: Secondary | ICD-10-CM | POA: Diagnosis not present

## 2023-06-29 DIAGNOSIS — H35342 Macular cyst, hole, or pseudohole, left eye: Secondary | ICD-10-CM | POA: Diagnosis not present

## 2023-07-06 ENCOUNTER — Telehealth: Payer: Self-pay | Admitting: Physician Assistant

## 2023-07-06 NOTE — Telephone Encounter (Signed)
Please offer her an office visit, soonest available, to discuss.

## 2023-07-06 NOTE — Telephone Encounter (Signed)
Pt scheduled for October 21st at 1:00. Pt voided understanding

## 2023-07-06 NOTE — Telephone Encounter (Signed)
New message    Patient at the front desk C/o bed wetting at night   Patient is unsure which medication not working for her bladder.  mirabegron ER (MYRBETRIQ) 50 MG TB24 tablet  & trimethoprim (TRIMPEX) 100 MG tablet voiced that the cost for medication was  $ 200.00

## 2023-07-13 ENCOUNTER — Ambulatory Visit: Payer: PPO | Admitting: Physician Assistant

## 2023-07-13 ENCOUNTER — Encounter: Payer: Self-pay | Admitting: Physician Assistant

## 2023-07-13 VITALS — BP 120/74 | HR 81 | Ht 68.0 in | Wt 165.0 lb

## 2023-07-13 DIAGNOSIS — N3281 Overactive bladder: Secondary | ICD-10-CM | POA: Diagnosis not present

## 2023-07-13 MED ORDER — TROSPIUM CHLORIDE ER 60 MG PO CP24: 1.0000 | ORAL_CAPSULE | Freq: Every day | ORAL | 11 refills | Status: DC

## 2023-07-13 NOTE — Progress Notes (Signed)
07/13/2023 1:09 PM   [name] 12-18-1931 [id number]  CC: Chief Complaint  Patient presents with   Over Active Bladder   HPI: Sarah [name] is a 89 y.o. female with PMH OSA not on CPAP, recurrent UTI on suppressive trimethoprim and cranberry supplements, OAB with nocturia on Myrbetriq 50 mg, and GSM on vaginal estrogen cream who presents today for OAB follow-up.   Her symptoms were previously well-controlled on Gemtesa, however it became cost prohibitive for her.  I switched her to Myrbetriq 50 mg 2 months ago, however this is also cost prohibitive at $200 per month.  Per chart review, she was previously on trospium ER 60 mg daily, however I do not see when this was discontinued or why.  Today she reports no dysuria.  She is primarily bothered by her nocturia every 2 hours as well as nocturnal enuresis.  She states she was not previously diagnosed with sleep apnea, but was given a CPAP because of her snoring.  She continued to snore, so she stopped using it.  Chart review states prior diagnosis of sleep apnea.  PMH: Past Medical History:  Diagnosis Date   Benign essential HTN 09/21/2015   Cancer (HCC)    Skin CA resected from Right eyelid and both legs.   Neuropathy    Seizures (HCC)    Thyroid disease     Surgical History: Past Surgical History:  Procedure Laterality Date   ABDOMINAL HYSTERECTOMY     BREAST BIOPSY Bilateral    neg   BREAST EXCISIONAL BIOPSY Right    KNEE ARTHROSCOPY W/ LATERAL RETINACULAR REPAIR      Home Medications:  Allergies as of 07/13/2023       Reactions   Macrobid [nitrofurantoin] Shortness Of Breath   Codeine Other (See Comments)   Makes her disoriented   Hydrocodone Other (See Comments)   Patient states she was seeing things that weren't there and very disoriented.   Oxycodone Other (See Comments)   hallucinations   Peanut-containing Drug Products    Other reaction(s): Headache   Peanuts [peanut Oil] Other  (See Comments)   Violent Migraines   Penicillins Nausea Only   Tramadol Other (See Comments)   Dizziness.        Medication List        Accurate as of July 13, 2023  1:09 PM. If you have any questions, ask your nurse or doctor.          acetaminophen 500 MG tablet Commonly known as: TYLENOL Take 1,000 mg by mouth every 6 (six) hours as needed.   alendronate 70 MG tablet Commonly known as: FOSAMAX Take 70 mg by mouth every 7 (seven) days.   beta carotene w/minerals tablet Take 1 tablet by mouth daily.   bimatoprost 0.03 % ophthalmic solution Commonly known as: LUMIGAN Place 1 drop into both eyes at bedtime.   Lumigan 0.01 % Soln Generic drug: bimatoprost Place 1 drop into both eyes at bedtime.   brimonidine 0.2 % ophthalmic solution Commonly known as: ALPHAGAN Place 1 drop into both eyes 3 (three) times daily.   dicyclomine 10 MG capsule Commonly known as: BENTYL Take 10 mg by mouth 4 (four) times daily -  before meals and at bedtime.   donepezil 10 MG disintegrating tablet Commonly known as: ARICEPT ODT Take 10 mg by mouth at bedtime.   dorzolamide 2 % ophthalmic solution Commonly known as: TRUSOPT Place 1 drop into both eyes 3 (three) times daily.   DULoxetine  HCl 40 MG Cpep Take 40 mg by mouth daily.   estradiol 0.1 MG/GM vaginal cream Commonly known as: ESTRACE Estrogen Cream Instruction Discard applicator Apply pea sized amount to tip of finger to urethra before bed. Wash hands well after application. Use Monday, Wednesday and Friday   fexofenadine 180 MG tablet Commonly known as: ALLEGRA Take 180 mg by mouth daily.   gabapentin 300 MG capsule Commonly known as: NEURONTIN Take 300 mg by mouth 3 (three) times daily. 300 mg tid and 600 mg qhs   latanoprost 0.005 % ophthalmic solution Commonly known as: XALATAN   levothyroxine 100 MCG tablet Commonly known as: SYNTHROID Take 100 mcg by mouth daily.   lovastatin 20 MG tablet Commonly  known as: MEVACOR Take 20 mg by mouth daily.   melatonin 1 MG Tabs tablet Take 10 mg by mouth once.   mirabegron ER 50 MG Tb24 tablet Commonly known as: MYRBETRIQ Take 1 tablet (50 mg total) by mouth daily.   montelukast 10 MG tablet Commonly known as: SINGULAIR Take 10 mg by mouth daily.   nortriptyline 10 MG capsule Commonly known as: PAMELOR Take 10 mg by mouth at bedtime.   omeprazole 20 MG capsule Commonly known as: PRILOSEC Take 20 mg by mouth daily.   polyethylene glycol powder 17 GM/SCOOP powder Commonly known as: GLYCOLAX/MIRALAX Take by mouth.   predniSONE 10 MG tablet Commonly known as: DELTASONE Take 1 tablet by mouth daily.   pregabalin 25 MG capsule Commonly known as: LYRICA Take 50 mg by mouth 2 (two) times daily. Patient to increase   Probiotic (Lactobacillus) Caps Take by mouth.   simvastatin 10 MG tablet Commonly known as: ZOCOR Take 10 mg by mouth at bedtime.   trimethoprim 100 MG tablet Commonly known as: TRIMPEX Take 1 tablet (100 mg total) by mouth daily.        Allergies:  Allergies  Allergen Reactions   Macrobid [Nitrofurantoin] Shortness Of Breath   Codeine Other (See Comments)    Makes her disoriented   Hydrocodone Other (See Comments)    Patient states she was seeing things that weren't there and very disoriented.   Oxycodone Other (See Comments)    hallucinations   Peanut-Containing Drug Products     Other reaction(s): Headache   Peanuts [Peanut Oil] Other (See Comments)    Violent Migraines   Penicillins Nausea Only   Tramadol Other (See Comments)    Dizziness.    Family History: Family History  Problem Relation Age of Onset   Breast cancer Mother 72   Breast cancer Maternal Uncle 35   Breast cancer Maternal Aunt        mat great aunt   Breast cancer Maternal Aunt 52   Tracheal cancer Father    Cancer Sister    Brain cancer Sister    Cancer Brother     Social History:   reports that she has never smoked.  She has never used smokeless tobacco. She reports that she does not drink alcohol. No history on file for drug use.  Physical Exam: BP 120/74   Pulse 81   Ht 5\' 8"  (1.727 m)   Wt 165 lb (74.8 kg)   BMI 25.09 kg/m   Constitutional:  Alert and oriented, no acute distress, nontoxic appearing HEENT: [place], AT Cardiovascular: No clubbing, cyanosis, or edema Respiratory: Normal respiratory effort, no increased work of breathing Skin: No rashes, bruises or suspicious lesions Neurologic: Grossly intact, no focal deficits, moving all 4 extremities Psychiatric: Normal  mood and affect  Assessment & Plan:   1. OAB (overactive bladder) Primarily bothered by nighttime symptoms.  We discussed the role of sleep apnea and nighttime symptoms.  She does not think she has sleep apnea and does not wish to resume CPAP.  We discussed alternative options including trial back on trospium or third line therapies including PTNS, intravesical Botox, or InterStim.  Of these, she is most interested in PTNS, however she would like to try trospium again first, which is reasonable.  I was honest with her that if her issue is secondary to OSA, I am unlikely to get her to her treatment goal if she does not go back on CPAP.  If she fails trospium, recommend PTNS. - Trospium Chloride 60 MG CP24; Take 1 capsule (60 mg total) by mouth daily.  Dispense: 30 capsule; Refill: 11   Return in about 6 weeks (around 08/24/2023) for Symptom recheck with PVR.  Carman Ching, PA-C  [facility] [place] [address], [address] [place], [place] [address] [phone]

## 2023-07-16 DIAGNOSIS — Z0000 Encounter for general adult medical examination without abnormal findings: Secondary | ICD-10-CM | POA: Diagnosis not present

## 2023-07-16 DIAGNOSIS — Z1330 Encounter for screening examination for mental health and behavioral disorders, unspecified: Secondary | ICD-10-CM | POA: Diagnosis not present

## 2023-07-16 DIAGNOSIS — Z1331 Encounter for screening for depression: Secondary | ICD-10-CM | POA: Diagnosis not present

## 2023-07-27 ENCOUNTER — Telehealth: Payer: PPO | Admitting: Student in an Organized Health Care Education/Training Program

## 2023-07-28 ENCOUNTER — Encounter: Payer: Self-pay | Admitting: Student in an Organized Health Care Education/Training Program

## 2023-07-29 ENCOUNTER — Ambulatory Visit
Payer: PPO | Attending: Student in an Organized Health Care Education/Training Program | Admitting: Student in an Organized Health Care Education/Training Program

## 2023-07-29 DIAGNOSIS — Z96651 Presence of right artificial knee joint: Secondary | ICD-10-CM

## 2023-07-29 DIAGNOSIS — J40 Bronchitis, not specified as acute or chronic: Secondary | ICD-10-CM | POA: Diagnosis not present

## 2023-07-29 DIAGNOSIS — M5126 Other intervertebral disc displacement, lumbar region: Secondary | ICD-10-CM | POA: Diagnosis not present

## 2023-07-29 DIAGNOSIS — G5703 Lesion of sciatic nerve, bilateral lower limbs: Secondary | ICD-10-CM

## 2023-07-29 DIAGNOSIS — M461 Sacroiliitis, not elsewhere classified: Secondary | ICD-10-CM

## 2023-07-29 DIAGNOSIS — M25561 Pain in right knee: Secondary | ICD-10-CM

## 2023-07-29 DIAGNOSIS — G8929 Other chronic pain: Secondary | ICD-10-CM | POA: Insufficient documentation

## 2023-07-29 DIAGNOSIS — M5416 Radiculopathy, lumbar region: Secondary | ICD-10-CM

## 2023-07-29 DIAGNOSIS — M1711 Unilateral primary osteoarthritis, right knee: Secondary | ICD-10-CM | POA: Diagnosis not present

## 2023-07-29 NOTE — Progress Notes (Signed)
Hey Sarah [name] this is Dr. Lourdes Sledge how you doing okay good so he felt like this last injection was helpful patient: Sarah [name]  Service Category: E/M  Provider: Edward Jolly, MD  DOB: 01/26/1932  DOS: 07/29/2023  Location: Office  MRN: [id number]  Setting: Ambulatory outpatient  Referring Provider: Rayetta Humphrey, MD  Type: Established Patient  Specialty: Interventional Pain Management  PCP: Rayetta Humphrey, MD  Location: Remote location  Delivery: TeleHealth     Virtual Encounter - Pain Management PROVIDER NOTE: Information contained herein reflects review and annotations entered in association with encounter. Interpretation of such information and data should be left to medically-trained personnel. Information provided to patient can be located elsewhere in the medical record under "Patient Instructions". Document created using STT-dictation technology, any transcriptional errors that may result from process are unintentional.    Contact & Pharmacy Preferred: [phone] Home: [phone] (home) Mobile: [phone] (mobile) E-mail: [email or web address]  [id number] [place], [place] - [address] [place] [address] Phone: [phone] Fax: [phone]  [facility] - [place], [place] - [address] [place] [address] Phone: [phone] Fax: [phone]   Pre-screening  Sarah [name] offered "in-person" vs "virtual" encounter. She indicated preferring virtual for this encounter.   Reason COVID-19*  Social distancing based on CDC and AMA recommendations.   I contacted Sarah [name] on 07/29/2023 via telephone.      I clearly identified myself as Edward Jolly, MD. I verified that I was speaking with the correct person using two identifiers (Name: Dayrin Stallone, and date of birth: 08/14/1932).  Consent I sought verbal advanced consent from Sarah [name] for virtual visit  interactions. I informed Ms. Dimitrov of possible security and privacy concerns, risks, and limitations associated with providing "not-in-person" medical evaluation and management services. I also informed Ms. Asberry of the availability of "in-person" appointments. Finally, I informed her that there would be a charge for the virtual visit and that she could be  personally, fully or partially, financially responsible for it. Ms. Egger expressed understanding and agreed to proceed.   Historic Elements   Sarah [name] is a 89 y.o. year old, female patient evaluated today after our last contact on 06/10/2023. Sarah [name]  has a past medical history of Benign essential HTN (09/21/2015), Cancer (HCC), Neuropathy, Seizures (HCC), and Thyroid disease. She also  has a past surgical history that includes Abdominal hysterectomy; Breast biopsy (Bilateral); Breast excisional biopsy (Right); and Knee arthroscopy w/ lateral retinacular repair. Sarah [name] has a current medication list which includes the following prescription(s): acetaminophen, alendronate, beta carotene w/minerals, bimatoprost, brimonidine, dicyclomine, donepezil, dorzolamide, duloxetine hcl, estradiol, fexofenadine, gabapentin, latanoprost, levothyroxine, lovastatin, lumigan, melatonin, montelukast, naproxen sodium, nortriptyline, polyethylene glycol powder, pregabalin, probiotic (lactobacillus), simvastatin, trimethoprim, trospium chloride, omeprazole, and prednisone. She  reports that she has never smoked. She has never used smokeless tobacco. She reports that she does not drink alcohol. No history on file for drug use. Sarah [name] is allergic to macrobid [nitrofurantoin], codeine, hydrocodone, oxycodone, peanut-containing drug products, peanuts [peanut oil], penicillins, and tramadol.  BMI: Estimated body mass index is 25.09 kg/m as calculated from the following:   Height as of 07/13/23: 5\' 8"  (1.727 m).   Weight as of 07/13/23: 165 lb (74.8  kg). Last encounter: 05/27/2023. Last procedure: 06/10/2023.  HPI  Today, she is being contacted for a post-procedure assessment.   Post-procedure evaluation   Type:  Genicular Nerves Block (Superolateral, Superomedial, and Inferomedial Genicular Nerves)  #1 and Lumbar TPI  Laterality: Right (-RT)  Level: Superior and inferior to the knee joint.  Imaging: Fluoroscopic guidance Anesthesia: Local anesthesia (1-2% Lidocaine) Anxiolysis: None                 Sedation: No Sedation                       DOS: 06/10/2023  Performed by: Edward Jolly, MD  Purpose: Diagnostic/Therapeutic Indications: Chronic knee pain severe enough to impact quality of life or function. Rationale (medical necessity): procedure needed and proper for the diagnosis and/or treatment of Sarah [name]'s medical symptoms and needs. 1. Primary osteoarthritis of right knee   2. Chronic knee pain after total replacement of right knee joint   3. Chronic pain of right knee    NAS-11 Pain score:   Pre-procedure: 10-Worst pain ever/10   Post-procedure: 10-Worst pain ever/10      Effectiveness:  Initial hour after procedure: 100 %  Subsequent 4-6 hours post-procedure: 100 %  Analgesia past initial 6 hours: 75% Ongoing improvement:  Analgesic:  75% Function: Sarah [name] reports improvement in function ROM: Sarah [name] reports improvement in ROM   Laboratory Chemistry Profile   Renal Lab Results  Component Value Date   BUN 23 (H) 09/14/2015   CREATININE 1.20 (H) 12/26/2022   GFRAA 77 09/12/2020   GFRNONAA 67 09/12/2020    Hepatic Lab Results  Component Value Date   AST 19 11/26/2015   ALT 15 11/26/2015   ALBUMIN 4.7 11/26/2015   ALKPHOS 64 11/26/2015    Electrolytes Lab Results  Component Value Date   NA 141 09/14/2015   K 3.5 09/14/2015   CL 107 09/14/2015   CALCIUM 9.7 09/14/2015   MG 2.3 01/20/2023    Bone No results found for: "VD25OH", "VD125OH2TOT", "KZ6010XN2", "TF5732KG2", "25OHVITD1",  "25OHVITD2", "25OHVITD3", "TESTOFREE", "TESTOSTERONE"  Inflammation (CRP: Acute Phase) (ESR: Chronic Phase) No results found for: "CRP", "ESRSEDRATE", "LATICACIDVEN"       Note: Above Lab results reviewed.  Imaging   Narrative & Impression  CLINICAL DATA:  Left leg pain.   EXAM: LUMBAR SPINE - COMPLETE 4+ VIEW   COMPARISON:  CT abdomen pelvis dated September 27, 2020.   FINDINGS: Five lumbar type vertebral bodies.   No acute fracture or subluxation. Vertebral body heights are preserved.   Alignment is normal.   Unchanged severe disc height loss from L2-L3 through L4-L5. Unchanged moderate to severe lower lumbar facet arthropathy.   The sacroiliac joints are unremarkable.   IMPRESSION: 1. Unchanged severe lumbar spondylosis.    The primary encounter diagnosis was Primary osteoarthritis of right knee. Diagnoses of SI joint arthritis (HCC), Chronic knee pain after total replacement of right knee joint, Chronic pain of right knee, Lumbar disc herniation (L2/L3), Lumbar radiculopathy, and Piriformis syndrome of both sides were also pertinent to this visit.  Plan of Care   Patient states that her right knee is doing much better after her right genicular nerve RFA.  We will continue to monitor her symptoms.  She states that she is struggling with low back pain that radiates into bilateral hips and down her posterior lateral thigh.  Her previous lumbar epidural steroid injection was in April 2023.  This provided her with 65% pain relief for over 6 months.  We discussed repeating her L2-L3 epidural steroid injection.  She is also endorsing bilateral SI joint pain.  We discussed  a therapeutic SI joint injection and piriformis injection as well.  Risks and benefits of these interventions were reviewed with the patient and she is eager to proceed.  Will continue to monitor her right knee pain but for the time being she is doing excellent after her RFA, can repeat RFA after 6 months if  needed.   Orders:  Orders Placed This Encounter  Procedures   Lumbar Epidural Injection    Standing Status:   Future    Standing Expiration Date:   10/29/2023    Scheduling Instructions:     Procedure: Interlaminar Lumbar Epidural Steroid injection (LESI)            Laterality: Midline L2/3     Timeframe: ASAA    Order Specific Question:   Where will this procedure be performed?    Answer:   [facility]   SACROILIAC JOINT INJECTION    Standing Status:   Future    Standing Expiration Date:   10/29/2023    Scheduling Instructions:     Side: Bilateral     Sedation: Patient's choice.     Timeframe: ASAP    Order Specific Question:   Where will this procedure be performed?    Answer:   [facility]   TRIGGER POINT INJECTION    Area: Buttocks region (gluteal area) Indications: Piriformis muscle pain;  B/L piriformis-syndrome; piriformis muscle spasms (W09.811). CPT code: 91478    Standing Status:   Future    Standing Expiration Date:   07/28/2024    Scheduling Instructions:     Type: Myoneural block (TPI) of piriformis muscle.     Side:  B/L     Sedation: Patient's choice.     Timeframe: Today    Order Specific Question:   Where will this procedure be performed?    Answer:   [facility]   Follow-up plan:   Return in about 19 days (around 08/17/2023) for L2/3 ESI + B/L SJ+ Piriformis.      Finds benefit with bilateral sacroiliac joint injection, previous one 03/14/2019 and then second 1 05/25/2019.  Also finds lumbar epidural steroid injections for when she has sciatic flare, consider repeating.  Previous one performed on 10/13/2018 at L2-L3.  Status post bilateral C4, C5, C6, C7 cervical facet medial branch nerve block on 03/05/2020, 05/21/20 for cervical facet arthropathy. Bilateral greater occipital nerve block 08/13/2020 helped significantly, repeat as needed.             Recent Visits Date Type Provider Dept  06/10/23 Procedure visit Edward Jolly, MD  [facility]  05/27/23 Office Visit Edward Jolly, MD [facility]  Showing recent visits within past 90 days and meeting all other requirements Today's Visits Date Type Provider Dept  07/29/23 Office Visit Edward Jolly, MD [facility]  Showing today's visits and meeting all other requirements Future Appointments No visits were found meeting these conditions. Showing future appointments within next 90 days and meeting all other requirements  I discussed the assessment and treatment plan with the patient. The patient was provided an opportunity to ask questions and all were answered. The patient agreed with the plan and demonstrated an understanding of the instructions.  Patient advised to call back or seek an in-person evaluation if the symptoms or condition worsens.  Duration of encounter: .  Note by: Edward Jolly, MD Date: 07/29/2023; Time: 4:20 PM

## 2023-07-30 NOTE — Patient Instructions (Signed)
 ______________________________________________________________________    Preparing for your procedure  Appointments: If you think you may not be able to keep your appointment, call 24-48 hours in advance to cancel. We need time to make it available to others.  During your procedure appointment there will be: No Prescription Refills. No disability issues to discussed. No medication changes or discussions.  Instructions: Food intake: Avoid eating anything solid for at least 8 hours prior to your procedure. Clear liquid intake: You may take clear liquids such as water up to 2 hours prior to your procedure. (No carbonated drinks. No soda.) Transportation: Unless otherwise stated by your physician, bring a driver. (Driver cannot be a Market researcher, Pharmacist, community, or any other form of public transportation.) Morning Medicines: Except for blood thinners, take all of your other morning medications with a sip of water. Make sure to take your heart and blood pressure medicines. If your blood pressure's lower number is above 100, the case will be rescheduled. Blood thinners: Make sure to stop your blood thinners as instructed.  If you take a blood thinner, but were not instructed to stop it, call our office [phone] and ask to talk to a nurse. Not stopping a blood thinner prior to certain procedures could lead to serious complications. Diabetics on insulin: Notify the staff so that you can be scheduled 1st case in the morning. If your diabetes requires high dose insulin, take only  of your normal insulin dose the morning of the procedure and notify the staff that you have done so. Preventing infections: Shower with an antibacterial soap the morning of your procedure.  Build-up your immune system: Take 1000 mg of Vitamin C with every meal (3 times a day) the day prior to your procedure. Antibiotics: Inform the nursing staff if you are taking any antibiotics or if you have any conditions that may require antibiotics  prior to procedures. (Example: recent joint implants)   Pregnancy: If you are pregnant make sure to notify the nursing staff. Not doing so may result in injury to the fetus, including death.  Sickness: If you have a cold, fever, or any active infections, call and cancel or reschedule your procedure. Receiving steroids while having an infection may result in complications. Arrival: You must be in the facility at least 30 minutes prior to your scheduled procedure. Tardiness: Your scheduled time is also the cutoff time. If you do not arrive at least 15 minutes prior to your procedure, you will be rescheduled.  Children: Do not bring any children with you. Make arrangements to keep them home. Dress appropriately: There is always a possibility that your clothing may get soiled. Avoid long dresses. Valuables: Do not bring any jewelry or valuables.  Reasons to call and reschedule or cancel your procedure: (Following these recommendations will minimize the risk of a serious complication.) Surgeries: Avoid having procedures within 2 weeks of any surgery. (Avoid for 2 weeks before or after any surgery). Flu Shots: Avoid having procedures within 2 weeks of a flu shots or . (Avoid for 2 weeks before or after immunizations). Barium: Avoid having a procedure within 7-10 days after having had a radiological study involving the use of radiological contrast. (Myelograms, Barium swallow or enema study). Heart attacks: Avoid any elective procedures or surgeries for the initial 6 months after a "Myocardial Infarction" (Heart Attack). Blood thinners: It is imperative that you stop these medications before procedures. Let us know if you if you take any blood thinner.  Infection: Avoid procedures during or within  two weeks of an infection (including chest colds or gastrointestinal problems). Symptoms associated with infections include: Localized redness, fever, chills, night sweats or profuse sweating, burning sensation  when voiding, cough, congestion, stuffiness, runny nose, sore throat, diarrhea, nausea, vomiting, cold or Flu symptoms, recent or current infections. It is specially important if the infection is over the area that we intend to treat. Heart and lung problems: Symptoms that may suggest an active cardiopulmonary problem include: cough, chest pain, breathing difficulties or shortness of breath, dizziness, ankle swelling, uncontrolled high or unusually low blood pressure, and/or palpitations. If you are experiencing any of these symptoms, cancel your procedure and contact your primary care physician for an evaluation.  Remember:  Regular Business hours are:  Monday to Thursday 8:00 AM to 4:00 PM  Provider's Schedule: Delano Metz, MD:  Procedure days: Tuesday and Thursday 7:30 AM to 4:00 PM  Edward Jolly, MD:  Procedure days: Monday and Wednesday 7:30 AM to 4:00 PM Last  Updated: 05/12/2023 ______________________________________________________________________

## 2023-08-06 DIAGNOSIS — R9431 Abnormal electrocardiogram [ECG] [EKG]: Secondary | ICD-10-CM | POA: Diagnosis not present

## 2023-08-06 DIAGNOSIS — I517 Cardiomegaly: Secondary | ICD-10-CM | POA: Diagnosis not present

## 2023-08-06 DIAGNOSIS — R7309 Other abnormal glucose: Secondary | ICD-10-CM | POA: Diagnosis not present

## 2023-08-06 DIAGNOSIS — I071 Rheumatic tricuspid insufficiency: Secondary | ICD-10-CM | POA: Diagnosis not present

## 2023-08-06 DIAGNOSIS — E785 Hyperlipidemia, unspecified: Secondary | ICD-10-CM | POA: Diagnosis not present

## 2023-08-06 DIAGNOSIS — G4733 Obstructive sleep apnea (adult) (pediatric): Secondary | ICD-10-CM | POA: Diagnosis not present

## 2023-08-06 DIAGNOSIS — I340 Nonrheumatic mitral (valve) insufficiency: Secondary | ICD-10-CM | POA: Diagnosis not present

## 2023-08-17 ENCOUNTER — Ambulatory Visit
Admission: RE | Admit: 2023-08-17 | Discharge: 2023-08-17 | Disposition: A | Discharge status: Home / Self Care | Payer: PPO | Source: Ambulatory Visit | Attending: Student in an Organized Health Care Education/Training Program | Admitting: Student in an Organized Health Care Education/Training Program

## 2023-08-17 ENCOUNTER — Encounter: Payer: Self-pay | Admitting: Student in an Organized Health Care Education/Training Program

## 2023-08-17 ENCOUNTER — Ambulatory Visit
Payer: PPO | Attending: Student in an Organized Health Care Education/Training Program | Admitting: Student in an Organized Health Care Education/Training Program

## 2023-08-17 VITALS — BP 131/89 | HR 74 | Temp 97.2°F | Resp 15 | Ht 68.0 in | Wt 166.0 lb

## 2023-08-17 DIAGNOSIS — Z79899 Other long term (current) drug therapy: Secondary | ICD-10-CM | POA: Insufficient documentation

## 2023-08-17 DIAGNOSIS — G894 Chronic pain syndrome: Secondary | ICD-10-CM | POA: Diagnosis not present

## 2023-08-17 DIAGNOSIS — M549 Dorsalgia, unspecified: Secondary | ICD-10-CM | POA: Insufficient documentation

## 2023-08-17 DIAGNOSIS — M5416 Radiculopathy, lumbar region: Secondary | ICD-10-CM | POA: Diagnosis not present

## 2023-08-17 DIAGNOSIS — M5126 Other intervertebral disc displacement, lumbar region: Secondary | ICD-10-CM | POA: Insufficient documentation

## 2023-08-17 DIAGNOSIS — G5703 Lesion of sciatic nerve, bilateral lower limbs: Secondary | ICD-10-CM | POA: Insufficient documentation

## 2023-08-17 DIAGNOSIS — M461 Sacroiliitis, not elsewhere classified: Secondary | ICD-10-CM | POA: Diagnosis not present

## 2023-08-17 DIAGNOSIS — M5116 Intervertebral disc disorders with radiculopathy, lumbar region: Secondary | ICD-10-CM | POA: Insufficient documentation

## 2023-08-17 MED ORDER — METHYLPREDNISOLONE ACETATE 80 MG/ML IJ SUSP
INTRAMUSCULAR | Status: AC
  Filled 2023-08-17: qty 1

## 2023-08-17 MED ORDER — IOHEXOL 180 MG/ML  SOLN
INTRAMUSCULAR | Status: AC
  Filled 2023-08-17: qty 20

## 2023-08-17 MED ORDER — ROPIVACAINE HCL 2 MG/ML IJ SOLN
18.0000 mL | Freq: Once | INTRAMUSCULAR | Status: AC
  Administered 2023-08-17: 18 mL via PERINEURAL

## 2023-08-17 MED ORDER — LIDOCAINE HCL 2 % IJ SOLN
20.0000 mL | Freq: Once | INTRAMUSCULAR | Status: AC
  Administered 2023-08-17: 400 mg

## 2023-08-17 MED ORDER — METHYLPREDNISOLONE ACETATE 80 MG/ML IJ SUSP
80.0000 mg | Freq: Once | INTRAMUSCULAR | Status: AC
  Administered 2023-08-17: 80 mg via INTRA_ARTICULAR

## 2023-08-17 MED ORDER — SODIUM CHLORIDE (PF) 0.9 % IJ SOLN
INTRAMUSCULAR | Status: AC
  Filled 2023-08-17: qty 10

## 2023-08-17 MED ORDER — SODIUM CHLORIDE 0.9% FLUSH
2.0000 mL | Freq: Once | INTRAVENOUS | Status: AC
  Administered 2023-08-17: 2 mL

## 2023-08-17 MED ORDER — IOHEXOL 180 MG/ML  SOLN
10.0000 mL | Freq: Once | INTRAMUSCULAR | Status: AC
  Administered 2023-08-17: 10 mL via EPIDURAL

## 2023-08-17 MED ORDER — DEXAMETHASONE SODIUM PHOSPHATE 10 MG/ML IJ SOLN
20.0000 mg | Freq: Once | INTRAMUSCULAR | Status: AC
  Administered 2023-08-17: 20 mg

## 2023-08-17 MED ORDER — DEXAMETHASONE SODIUM PHOSPHATE 10 MG/ML IJ SOLN
INTRAMUSCULAR | Status: AC
  Filled 2023-08-17: qty 2

## 2023-08-17 MED ORDER — LIDOCAINE HCL 2 % IJ SOLN
INTRAMUSCULAR | Status: AC
  Filled 2023-08-17: qty 20

## 2023-08-17 MED ORDER — ROPIVACAINE HCL 2 MG/ML IJ SOLN
INTRAMUSCULAR | Status: AC
  Filled 2023-08-17: qty 20

## 2023-08-17 MED ORDER — ROPIVACAINE HCL 2 MG/ML IJ SOLN
2.0000 mL | Freq: Once | INTRAMUSCULAR | Status: AC
  Administered 2023-08-17: 2 mL via EPIDURAL

## 2023-08-17 NOTE — Patient Instructions (Signed)
______________________________________________________________________    Post-Procedure Discharge Instructions  Instructions: Apply ice:  Purpose: This will minimize any swelling and discomfort after procedure.  When: Day of procedure, as soon as you get home. How: Fill a plastic sandwich bag with crushed ice. Cover it with a small towel and apply to injection site. How long: (15 min on, 15 min off) Apply for 15 minutes then remove x 15 minutes.  Repeat sequence on day of procedure, until you go to bed. Apply heat:  Purpose: To treat any soreness and discomfort from the procedure. When: Starting the next day after the procedure. How: Apply heat to procedure site starting the day following the procedure. How long: May continue to repeat daily, until discomfort goes away. Food intake: Start with clear liquids (like water) and advance to regular food, as tolerated.  Physical activities: Keep activities to a minimum for the first 8 hours after the procedure. After that, then as tolerated. Driving: If you have received any sedation, be responsible and do not drive. You are not allowed to drive for 24 hours after having sedation. Blood thinner: (Applies only to those taking blood thinners) You may restart your blood thinner 6 hours after your procedure. Insulin: (Applies only to Diabetic patients taking insulin) As soon as you can eat, you may resume your normal dosing schedule. Infection prevention: Keep procedure site clean and dry. Shower daily and clean area with soap and water. Post-procedure Pain Diary: Extremely important that this be done correctly and accurately. Recorded information will be used to determine the next step in treatment. For the purpose of accuracy, follow these rules: Evaluate only the area treated. Do not report or include pain from an untreated area. For the purpose of this evaluation, ignore all other areas of pain, except for the treated area. After your procedure,  avoid taking a long nap and attempting to complete the pain diary after you wake up. Instead, set your alarm clock to go off every hour, on the hour, for the initial 8 hours after the procedure. Document the duration of the numbing medicine, and the relief you are getting from it. Do not go to sleep and attempt to complete it later. It will not be accurate. If you received sedation, it is likely that you were given a medication that may cause amnesia. Because of this, completing the diary at a later time may cause the information to be inaccurate. This information is needed to plan your care. Follow-up appointment: Keep your post-procedure follow-up evaluation appointment after the procedure (usually 2 weeks for most procedures, 6 weeks for radiofrequencies). DO NOT FORGET to bring you pain diary with you.   Expect: (What should I expect to see with my procedure?) From numbing medicine (AKA: Local Anesthetics): Numbness or decrease in pain. You may also experience some weakness, which if present, could last for the duration of the local anesthetic. Onset: Full effect within 15 minutes of injected. Duration: It will depend on the type of local anesthetic used. On the average, 1 to 8 hours.  From steroids (Applies only if steroids were used): Decrease in swelling or inflammation. Once inflammation is improved, relief of the pain will follow. Onset of benefits: Depends on the amount of swelling present. The more swelling, the longer it will take for the benefits to be seen. In some cases, up to 10 days. Duration: Steroids will stay in the system x 2 weeks. Duration of benefits will depend on multiple posibilities including persistent irritating factors. Side-effects: If  present, they may typically last 2 weeks (the duration of the steroids). Frequent: Cramps (if they occur, drink Gatorade and take over-the-counter Magnesium 450-500 mg once to twice a day); water retention with temporary weight gain;  increases in blood sugar; decreased immune system response; increased appetite. Occasional: Facial flushing (red, warm cheeks); mood swings; menstrual changes. Uncommon: Long-term decrease or suppression of natural hormones; bone thinning. (These are more common with higher doses or more frequent use. This is why we prefer that our patients avoid having any injection therapies in other practices.)  Very Rare: Severe mood changes; psychosis; aseptic necrosis. From procedure: Some discomfort is to be expected once the numbing medicine wears off. This should be minimal if ice and heat are applied as instructed.  Call if: (When should I call?) You experience numbness and weakness that gets worse with time, as opposed to wearing off. New onset bowel or bladder incontinence. (Applies only to procedures done in the spine)  Emergency Numbers: Durning business hours (Monday - Thursday, 8:00 AM - 4:00 PM) (Friday, 9:00 AM - 12:00 Noon): [phone] After hours: [phone] NOTE: If you are having a problem and are unable connect with, or to talk to a provider, then go to your nearest urgent care or emergency department. If the problem is serious and urgent, please call 911. ______________________________________________________________________     Sacroiliac (SI) Joint Injection Patient Information  Description: The sacroiliac joint connects the scrum (very low back and tailbone) to the ilium (a pelvic bone which also forms half of the hip joint).  Normally this joint experiences very little motion.  When this joint becomes inflamed or unstable low back and or hip and pelvis pain may result.  Injection of this joint with local anesthetics (numbing medicines) and steroids can provide diagnostic information and reduce pain.  This injection is performed with the aid of x-ray guidance into the tailbone area while you are lying on your stomach.   You may experience an electrical sensation down the leg  while this is being done.  You may also experience numbness.  We also may ask if we are reproducing your normal pain during the injection.  Conditions which may be treated SI injection:  Low back, buttock, hip or leg pain  Preparation for the Injection:  Do not eat any solid food or dairy products within 8 hours of your appointment.  You may drink clear liquids up to 3 hours before appointment.  Clear liquids include water, black coffee, juice or soda.  No milk or cream please. You may take your regular medications, including pain medications with a sip of water before your appointment.  Diabetics should hold regular insulin (if take separately) and take 1/2 normal NPH dose the morning of the procedure.  Carry some sugar containing items with you to your appointment. A driver must accompany you and be prepared to drive you home after your procedure. Bring all of your current medications with you. An IV may be inserted and sedation may be given at the discretion of the physician. A blood pressure cuff, EKG and other monitors will often be applied during the procedure.  Some patients may need to have extra oxygen administered for a short period.  You will be asked to provide medical information, including your allergies, prior to the procedure.  We must know immediately if you are taking blood thinners (like Coumadin/Warfarin) or if you are allergic to IV iodine contrast (dye).  We must know if you could possible  be pregnant.  Possible side effects:  Bleeding from needle site Infection (rare, may require surgery) Nerve injury (rare) Numbness & tingling (temporary) A brief convulsion or seizure Light-headedness (temporary) Pain at injection site (several days) Decreased blood pressure (temporary) Weakness in the leg (temporary)   Call if you experience:  New onset weakness or numbness of an extremity below the injection site that last more than 8 hours. Hives or difficulty breathing (  go to the emergency room) Inflammation or drainage at the injection site Any new symptoms which are concerning to you  Please note:  Although the local anesthetic injected can often make your back/ hip/ buttock/ leg feel good for several hours after the injections, the pain will likely return.  It takes 3-7 days for steroids to work in the sacroiliac area.  You may not notice any pain relief for at least that one week.  If effective, we will often do a series of three injections spaced 3-6 weeks apart to maximally decrease your pain.  After the initial series, we generally will wait some months before a repeat injection of the same type.  If you have any questions, please call [phone] [facility]  Epidural Steroid Injection Patient Information  Description: The epidural space surrounds the nerves as they exit the spinal cord.  In some patients, the nerves can be compressed and inflamed by a bulging disc or a tight spinal canal (spinal stenosis).  By injecting steroids into the epidural space, we can bring irritated nerves into direct contact with a potentially helpful medication.  These steroids act directly on the irritated nerves and can reduce swelling and inflammation which often leads to decreased pain.  Epidural steroids may be injected anywhere along the spine and from the neck to the low back depending upon the location of your pain.   After numbing the skin with local anesthetic (like Novocaine), a small needle is passed into the epidural space slowly.  You may experience a sensation of pressure while this is being done.  The entire block usually last less than 10 minutes.  Conditions which may be treated by epidural steroids:  Low back and leg pain Neck and arm pain Spinal stenosis Post-laminectomy syndrome Herpes zoster (shingles) pain Pain from compression fractures  Preparation for the injection:  Do not eat any solid food or dairy  products within 8 hours of your appointment.  You may drink clear liquids up to 3 hours before appointment.  Clear liquids include water, black coffee, juice or soda.  No milk or cream please. You may take your regular medication, including pain medications, with a sip of water before your appointment  Diabetics should hold regular insulin (if taken separately) and take 1/2 normal NPH dos the morning of the procedure.  Carry some sugar containing items with you to your appointment. A driver must accompany you and be prepared to drive you home after your procedure.  Bring all your current medications with your. An IV may be inserted and sedation may be given at the discretion of the physician.   A blood pressure cuff, EKG and other monitors will often be applied during the procedure.  Some patients may need to have extra oxygen administered for a short period. You will be asked to provide medical information, including your allergies, prior to the procedure.  We must know immediately if you are taking blood thinners (like Coumadin/Warfarin)  Or if you are allergic to IV iodine contrast (dye).  We must know if you could possible be pregnant.  Possible side-effects: Bleeding from needle site Infection (rare, may require surgery) Nerve injury (rare) Numbness & tingling (temporary) Difficulty urinating (rare, temporary) Spinal headache ( a headache worse with upright posture) Light -headedness (temporary) Pain at injection site (several days) Decreased blood pressure (temporary) Weakness in arm/leg (temporary) Pressure sensation in back/neck (temporary)  Call if you experience: Fever/chills associated with headache or increased back/neck pain. Headache worsened by an upright position. New onset weakness or numbness of an extremity below the injection site Hives or difficulty breathing (go to the emergency room) Inflammation or drainage at the infection site Severe back/neck pain Any new  symptoms which are concerning to you  Please note:  Although the local anesthetic injected can often make your back or neck feel good for several hours after the injection, the pain will likely return.  It takes 3-7 days for steroids to work in the epidural space.  You may not notice any pain relief for at least that one week.  If effective, we will often do a series of three injections spaced 3-6 weeks apart to maximally decrease your pain.  After the initial series, we generally will wait several months before considering a repeat injection of the same type.  If you have any questions, please call [phone] [facility]

## 2023-08-17 NOTE — Progress Notes (Signed)
Patient's Name: Sarah [name]  MRN: [id number]  Referring Provider: Rayetta Humphrey, MD  DOB: 07/05/1932  PCP: Rayetta Humphrey, MD  DOS: 08/17/2023  Note by: Edward Jolly, MD  Service setting: Ambulatory outpatient  Specialty: Interventional Pain Management  Patient type: Established  Location: [facility] (AMB) Pain Management Facility  Visit type: Interventional Procedure   Primary Reason for Visit: Interventional Pain Management Treatment. CC: Back Pain  Procedure:          Anesthesia, Analgesia, Anxiolysis:  Type: Therapeutic Sacroiliac Joint Steroid Injection and Piriformis TPI Region: Inferior Lumbosacral Region Level: PIIS (Posterior Inferior Iliac Spine) Laterality: Bilateral    Local Anesthetic: Lidocaine 1-2%  Position: Prone           Indications: SI joint arthritis Piriformis Syndrome bilateral  Pain Score: Pre-procedure: 5 /10 Post-procedure: 5 /10  Pre-op Assessment:  Sarah [name] is a 89 y.o. (year old), female patient, seen today for interventional treatment. She  has a past surgical history that includes Abdominal hysterectomy; Breast biopsy (Bilateral); Breast excisional biopsy (Right); and Knee arthroscopy w/ lateral retinacular repair. Sarah [name] has a current medication list which includes the following prescription(s): acetaminophen, alendronate, beta carotene w/minerals, bimatoprost, brimonidine, dicyclomine, donepezil, dorzolamide, duloxetine hcl, estradiol, fexofenadine, gabapentin, latanoprost, levothyroxine, lovastatin, lumigan, melatonin, montelukast, naproxen sodium, nortriptyline, polyethylene glycol powder, prednisone, pregabalin, probiotic (lactobacillus), simvastatin, trimethoprim, trospium chloride, and omeprazole. Her primarily concern today is the Back Pain  Initial Vital Signs:  Pulse/HCG Rate: 74ECG Heart Rate: 73 Temp: (!) 97.2 F (36.2 C) Resp: 16 BP: (!) 143/85 SpO2: 100 %  BMI: Estimated body mass index is 25.24 kg/m as calculated  from the following:   Height as of this encounter: 5\' 8"  (1.727 m).   Weight as of this encounter: 166 lb (75.3 kg).  Risk Assessment: Allergies: Reviewed. She is allergic to macrobid [nitrofurantoin], codeine, hydrocodone, oxycodone, peanut-containing drug products, peanuts [peanut oil], penicillins, and tramadol.  Allergy Precautions: None required Coagulopathies: Reviewed. None identified.  Blood-thinner therapy: None at this time Active Infection(s): Reviewed. None identified. Sarah [name] is afebrile  Site Confirmation: Sarah [name] was asked to confirm the procedure and laterality before marking the site Procedure checklist: Completed Consent: Before the procedure and under the influence of no sedative(s), amnesic(s), or anxiolytics, the patient was informed of the treatment options, risks and possible complications. To fulfill our ethical and legal obligations, as recommended by the American Medical Association's Code of Ethics, I have informed the patient of my clinical impression; the nature and purpose of the treatment or procedure; the risks, benefits, and possible complications of the intervention; the alternatives, including doing nothing; the risk(s) and benefit(s) of the alternative treatment(s) or procedure(s); and the risk(s) and benefit(s) of doing nothing. The patient was provided information about the general risks and possible complications associated with the procedure. These may include, but are not limited to: failure to achieve desired goals, infection, bleeding, organ or nerve damage, allergic reactions, paralysis, and death. In addition, the patient was informed of those risks and complications associated to the procedure, such as failure to decrease pain; infection; bleeding; organ or nerve damage with subsequent damage to sensory, motor, and/or autonomic systems, resulting in permanent pain, numbness, and/or weakness of one or several areas of the body; allergic reactions;  (i.e.: anaphylactic reaction); and/or death. Furthermore, the patient was informed of those risks and complications associated with the medications. These include, but are not limited to: allergic reactions (i.e.: anaphylactic or anaphylactoid reaction(s)); adrenal axis suppression; blood sugar  elevation that in diabetics may result in ketoacidosis or comma; water retention that in patients with history of congestive heart failure may result in shortness of breath, pulmonary edema, and decompensation with resultant heart failure; weight gain; swelling or edema; medication-induced neural toxicity; particulate matter embolism and blood vessel occlusion with resultant organ, and/or nervous system infarction; and/or aseptic necrosis of one or more joints. Finally, the patient was informed that Medicine is not an exact science; therefore, there is also the possibility of unforeseen or unpredictable risks and/or possible complications that may result in a catastrophic outcome. The patient indicated having understood very clearly. We have given the patient no guarantees and we have made no promises. Enough time was given to the patient to ask questions, all of which were answered to the patient's satisfaction. Sarah [name] has indicated that she wanted to continue with the procedure. Attestation: I, the ordering provider, attest that I have discussed with the patient the benefits, risks, side-effects, alternatives, likelihood of achieving goals, and potential problems during recovery for the procedure that I have provided informed consent. Date  Time: 08/17/2023  9:39 AM  Pre-Procedure Preparation:  Monitoring: As per clinic protocol. Respiration, ETCO2, SpO2, BP, heart rate and rhythm monitor placed and checked for adequate function Safety Precautions: Patient was assessed for positional comfort and pressure points before starting the procedure. Time-out: I initiated and conducted the "Time-out" before starting  the procedure, as per protocol. The patient was asked to participate by confirming the accuracy of the "Time Out" information. Verification of the correct person, site, and procedure were performed and confirmed by me, the nursing staff, and the patient. "Time-out" conducted as per Joint Commission's Universal Protocol (UP.01.01.01). Time: 1014  Description of Procedure:          Target Area: Inferior, posterior, aspect of the sacroiliac fissure Approach: Posterior, paraspinal, ipsilateral approach. Area Prepped: Entire Lower Lumbosacral Region Prepping solution: ChloraPrep (2% chlorhexidine gluconate and 70% isopropyl alcohol) Safety Precautions: Aspiration looking for blood return was conducted prior to all injections. At no point did we inject any substances, as a needle was being advanced. No attempts were made at seeking any paresthesias. Safe injection practices and needle disposal techniques used. Medications properly checked for expiration dates. SDV (single dose vial) medications used. Description of the Procedure: Protocol guidelines were followed. The patient was placed in position over the procedure table. The target area was identified and the area prepped in the usual manner. Skin & deeper tissues infiltrated with local anesthetic. Appropriate amount of time allowed to pass for local anesthetics to take effect. The procedure needle was advanced under fluoroscopic guidance into the sacroiliac joint until a firm endpoint was obtained. Proper needle placement secured. Negative aspiration confirmed. Solution injected in intermittent fashion, asking for systemic symptoms every 0.5cc of injectate. The needles were then removed and the area cleansed, making sure to leave some of the prepping solution back to take advantage of its long term bactericidal properties. Vitals:   08/17/23 0944 08/17/23 1014 08/17/23 1019 08/17/23 1024  BP: (!) 143/85 (!) 129/91 (!) 145/96 131/89  Pulse: 74     Resp:  16 18 16 15   Temp: (!) 97.2 F (36.2 C)     TempSrc: Temporal     SpO2: 100% 98% 95% 95%  Weight: 166 lb (75.3 kg)     Height: 5\' 8"  (1.727 m)       Start Time: 1014 hrs. End Time: 1024 hrs. Materials:  Needle(s) Type: Spinal Needle Gauge: 25G  Length: 3.5-in Medication(s): Please see orders for medications and dosing details. 5 cc solution made of 4.5 cc of 0.2% ropivacaine, 0.5 of Decadron 10 mg/cc.  2.5 cc injected intra-articular, left sacroiliac joint 5 cc solution made of 4.5 cc of 0.2% ropivacaine, 0.5 of Decadron 10 mg/cc.  2.5 cc injected intra-articular, right sacroiliac joint Total steroid dose: 10 mg Decadron for SI joint b/l  Afterwards a right and left piriformis trigger point injection was done 1 cm inferior, 1 cm deep, 1 cm lateral to the inferior fissure of the SI joint.  Contrast was injected to confirm piriformis muscle striation.  While injecting, patient did not complain of any pain radiating down her leg.  2.5 cc of the above-mentioned solution injected for the left piriformis, 2.5 cc of the above-mentioned nerve block solution injected for the right piriformis  Imaging Guidance (Non-Spinal):          Type of Imaging Technique: Fluoroscopy Guidance (Non-Spinal) Indication(s): Assistance in needle guidance and placement for procedures requiring needle placement in or near specific anatomical locations not easily accessible without such assistance. Exposure Time: Please see nurses notes. Contrast: Before injecting any contrast, we confirmed that the patient did not have an allergy to iodine, shellfish, or radiological contrast. Once satisfactory needle placement was completed at the desired level, radiological contrast was injected. Contrast injected under live fluoroscopy. No contrast complications. See chart for type and volume of contrast used. Fluoroscopic Guidance: I was personally present during the use of fluoroscopy. "Tunnel Vision Technique" used to obtain the  best possible view of the target area. Parallax error corrected before commencing the procedure. "Direction-depth-direction" technique used to introduce the needle under continuous pulsed fluoroscopy. Once target was reached, antero-posterior, oblique, and lateral fluoroscopic projection used confirm needle placement in all planes. Images permanently stored in EMR. Interpretation: I personally interpreted the imaging intraoperatively. Adequate needle placement confirmed in multiple planes. Appropriate spread of contrast into desired area was observed. No evidence of afferent or efferent intravascular uptake. Permanent images saved into the patient's record.  Antibiotic Prophylaxis:   Anti-infectives (From admission, onward)    None      Indication(s): None identified  Post-operative Assessment:  Post-procedure Vital Signs:  Pulse/HCG Rate: 7475 Temp: (!) 97.2 F (36.2 C) Resp: 15 BP: 131/89 SpO2: 95 %  EBL: None  Complications: No immediate post-treatment complications observed by team, or reported by patient.  Note: The patient tolerated the entire procedure well. A repeat set of vitals were taken after the procedure and the patient was kept under observation following institutional policy, for this type of procedure. Post-procedural neurological assessment was performed, showing return to baseline, prior to discharge. The patient was provided with post-procedure discharge instructions, including a section on how to identify potential problems. Should any problems arise concerning this procedure, the patient was given instructions to immediately contact us, at any time, without hesitation. In any case, we plan to contact the patient by telephone for a follow-up status report regarding this interventional procedure.  Comments:  No additional relevant information.  Plan of Care  Orders:  Orders Placed This Encounter  Procedures   DG PAIN CLINIC C-ARM 1-60 MIN NO REPORT     Intraoperative interpretation by procedural physician at [facility].    Standing Status:   Standing    Number of Occurrences:   1    Order Specific Question:   Reason for exam:    Answer:   Assistance in needle guidance and placement for procedures  requiring needle placement in or near specific anatomical locations not easily accessible without such assistance.   Medications ordered for procedure: Meds ordered this encounter  Medications   iohexol (OMNIPAQUE) 180 MG/ML injection 10 mL    Must be Myelogram-compatible. If not available, you may substitute with a water-soluble, non-ionic, hypoallergenic, myelogram-compatible radiological contrast medium.   lidocaine (XYLOCAINE) 2 % (with pres) injection 400 mg   ropivacaine (PF) 2 mg/mL (0.2%) (NAROPIN) injection 2 mL   sodium chloride flush (NS) 0.9 % injection 2 mL   ropivacaine (PF) 2 mg/mL (0.2%) (NAROPIN) injection 18 mL   dexamethasone (DECADRON) injection 20 mg   methylPREDNISolone acetate (DEPO-MEDROL) injection 80 mg   Medications administered: We administered iohexol, lidocaine, ropivacaine (PF) 2 mg/mL (0.2%), sodium chloride flush, ropivacaine (PF) 2 mg/mL (0.2%), dexamethasone, and methylPREDNISolone acetate.  See the medical record for exact dosing, route, and time of administration.  Disposition: Discharge home  Discharge Date & Time: 08/17/2023; 1035 hrs.   Follow-up plan:   Return in about 8 weeks (around 10/12/2023) for PPE, F2F.     Future Appointments  Date Time Provider Department Center  08/24/2023  2:30 PM Carman Ching, PA-C [facility] None  10/12/2023  2:00 PM Edward Jolly, MD [facility] None  10/29/2023  2:00 PM Bethann Berkshire None   Primary Care Physician: Rayetta Humphrey, MD Location: [facility] Note by: Edward Jolly, MD Date: 08/17/2023; Time: 11:23 AM  Disclaimer:  Medicine is not an exact science. The only guarantee in medicine is  that nothing is guaranteed. It is important to note that the decision to proceed with this intervention was based on the information collected from the patient. The Data and conclusions were drawn from the patient's questionnaire, the interview, and the physical examination. Because the information was provided in large part by the patient, it cannot be guaranteed that it has not been purposely or unconsciously manipulated. Every effort has been made to obtain as much relevant data as possible for this evaluation. It is important to note that the conclusions that lead to this procedure are derived in large part from the available data. Always take into account that the treatment will also be dependent on availability of resources and existing treatment guidelines, considered by other Pain Management Practitioners as being common knowledge and practice, at the time of the intervention. For Medico-Legal purposes, it is also important to point out that variation in procedural techniques and pharmacological choices are the acceptable norm. The indications, contraindications, technique, and results of the above procedure should only be interpreted and judged by a Board-Certified Interventional Pain Specialist with extensive familiarity and expertise in the same exact procedure and technique.

## 2023-08-17 NOTE — Progress Notes (Signed)
Safety precautions to be maintained throughout the outpatient stay will include: orient to surroundings, keep bed in low position, maintain call bell within reach at all times, provide assistance with transfer out of bed and ambulation.  

## 2023-08-17 NOTE — Progress Notes (Signed)
Patient's Name: Sarah [name]  MRN: [id number]  Referring Provider: Rayetta Humphrey, MD  DOB: [birth year]  PCP: Rayetta Humphrey, MD  DOS: 08/17/2023  Note by: Edward Jolly, MD  Service setting: Ambulatory outpatient  Specialty: Interventional Pain Management  Patient type: Established  Location: [facility] (AMB) Pain Management Facility  Visit type: Interventional Procedure   Primary Reason for Visit: Interventional Pain Management Treatment. CC: Back Pain   Procedure:          Anesthesia, Analgesia, Anxiolysis:  Type: Therapeutic Inter-Laminar Epidural Steroid Injection   Region: Lumbar Level: L2-3 Level. Laterality: Midline         Type: Local Anesthesia Indication(s): Analgesia         Route: Infiltration ([place]/IM) IV Access: Declined Sedation: Declined  Local Anesthetic: Lidocaine 1-2%  Position: Prone with head of the table was raised to facilitate breathing.   Indications: 1. Lumbar disc herniation (L2/L3)   2. Lumbar radiculopathy   3. SI joint arthritis (HCC)   4. Piriformis syndrome of both sides   5. Chronic pain syndrome     Pain Score: Pre-procedure: 5 /10 Post-procedure: 5 /10  Pre-op Assessment:  [name] is a 89 y.o. (year old), female patient, seen today for interventional treatment. She  has a past surgical history that includes Abdominal hysterectomy; Breast biopsy (Bilateral); Breast excisional biopsy (Right); and Knee arthroscopy w/ lateral retinacular repair. [name] has a current medication list which includes the following prescription(s): acetaminophen, alendronate, beta carotene w/minerals, bimatoprost, brimonidine, dicyclomine, donepezil, dorzolamide, duloxetine hcl, estradiol, fexofenadine, gabapentin, latanoprost, levothyroxine, lovastatin, lumigan, melatonin, montelukast, naproxen sodium, nortriptyline, polyethylene glycol powder, prednisone, pregabalin, probiotic (lactobacillus), simvastatin, trimethoprim, trospium chloride, and omeprazole.  Her primarily concern today is the Back Pain   Initial Vital Signs:  Pulse/HCG Rate: 74ECG Heart Rate: 73 Temp: (!) 97.2 F (36.2 C) Resp: 16 BP: (!) 143/85 SpO2: 100 %  BMI: Estimated body mass index is 25.24 kg/m as calculated from the following:   Height as of this encounter: 5\' 8"  (1.727 m).   Weight as of this encounter: 166 lb (75.3 kg).  Risk Assessment: Allergies: Reviewed. She is allergic to macrobid [nitrofurantoin], codeine, hydrocodone, oxycodone, peanut-containing drug products, peanuts [peanut oil], penicillins, and tramadol.  Allergy Precautions: None required Coagulopathies: Reviewed. None identified.  Blood-thinner therapy: None at this time Active Infection(s): Reviewed. None identified. [name] is afebrile  Site Confirmation: [name] was asked to confirm the procedure and laterality before marking the site Procedure checklist: Completed Consent: Before the procedure and under the influence of no sedative(s), amnesic(s), or anxiolytics, the patient was informed of the treatment options, risks and possible complications. To fulfill our ethical and legal obligations, as recommended by the American Medical Association's Code of Ethics, I have informed the patient of my clinical impression; the nature and purpose of the treatment or procedure; the risks, benefits, and possible complications of the intervention; the alternatives, including doing nothing; the risk(s) and benefit(s) of the alternative treatment(s) or procedure(s); and the risk(s) and benefit(s) of doing nothing. The patient was provided information about the general risks and possible complications associated with the procedure. These may include, but are not limited to: failure to achieve desired goals, infection, bleeding, organ or nerve damage, allergic reactions, paralysis, and death. In addition, the patient was informed of those risks and complications associated to Spine-related procedures, such as  failure to decrease pain; infection (i.e.: Meningitis, epidural or intraspinal abscess); bleeding (i.e.: epidural hematoma, subarachnoid hemorrhage, or any other type  of intraspinal or peri-dural bleeding); organ or nerve damage (i.e.: Any type of peripheral nerve, nerve root, or spinal cord injury) with subsequent damage to sensory, motor, and/or autonomic systems, resulting in permanent pain, numbness, and/or weakness of one or several areas of the body; allergic reactions; (i.e.: anaphylactic reaction); and/or death. Furthermore, the patient was informed of those risks and complications associated with the medications. These include, but are not limited to: allergic reactions (i.e.: anaphylactic or anaphylactoid reaction(s)); adrenal axis suppression; blood sugar elevation that in diabetics may result in ketoacidosis or comma; water retention that in patients with history of congestive heart failure may result in shortness of breath, pulmonary edema, and decompensation with resultant heart failure; weight gain; swelling or edema; medication-induced neural toxicity; particulate matter embolism and blood vessel occlusion with resultant organ, and/or nervous system infarction; and/or aseptic necrosis of one or more joints. Finally, the patient was informed that Medicine is not an exact science; therefore, there is also the possibility of unforeseen or unpredictable risks and/or possible complications that may result in a catastrophic outcome. The patient indicated having understood very clearly. We have given the patient no guarantees and we have made no promises. Enough time was given to the patient to ask questions, all of which were answered to the patient's satisfaction. [name] has indicated that she wanted to continue with the procedure. Attestation: I, the ordering provider, attest that I have discussed with the patient the benefits, risks, side-effects, alternatives, likelihood of achieving goals, and  potential problems during recovery for the procedure that I have provided informed consent. Date  Time: 08/17/2023  9:39 AM  Pre-Procedure Preparation:  Monitoring: As per clinic protocol. Respiration, ETCO2, SpO2, BP, heart rate and rhythm monitor placed and checked for adequate function Safety Precautions: Patient was assessed for positional comfort and pressure points before starting the procedure. Time-out: I initiated and conducted the "Time-out" before starting the procedure, as per protocol. The patient was asked to participate by confirming the accuracy of the "Time Out" information. Verification of the correct person, site, and procedure were performed and confirmed by me, the nursing staff, and the patient. "Time-out" conducted as per Joint Commission's Universal Protocol (UP.01.01.01). Time: 1014  Description of Procedure:          Target Area: The interlaminar space, initially targeting the lower laminar border of the superior vertebral body. Approach: Paramedial approach. Area Prepped: Entire Posterior Lumbar Region Prepping solution: ChloraPrep (2% chlorhexidine gluconate and 70% isopropyl alcohol) Safety Precautions: Aspiration looking for blood return was conducted prior to all injections. At no point did we inject any substances, as a needle was being advanced. No attempts were made at seeking any paresthesias. Safe injection practices and needle disposal techniques used. Medications properly checked for expiration dates. SDV (single dose vial) medications used. Description of the Procedure: Protocol guidelines were followed. The procedure needle was introduced through the skin, ipsilateral to the reported pain, and advanced to the target area. Bone was contacted and the needle walked caudad, until the lamina was cleared. The epidural space was identified using "loss-of-resistance technique" with 2-3 ml of PF-NaCl (0.9% NSS), in a 5cc LOR glass syringe.  Vitals:   08/17/23 0944  08/17/23 1014 08/17/23 1019 08/17/23 1024  BP: (!) 143/85 (!) 129/91 (!) 145/96 131/89  Pulse: 74     Resp: 16 18 16 15   Temp: (!) 97.2 F (36.2 C)     TempSrc: Temporal     SpO2: 100% 98% 95% 95%  Weight: 166  lb (75.3 kg)     Height: 5\' 8"  (1.727 m)       Start Time: 1014 hrs. End Time: 1024 hrs.  Materials:  Needle(s) Type: Epidural needle Gauge: 22G Length: 3.5-in Medication(s): Please see orders for medications and dosing details. 5CC solution made of 2cc of preservative-free saline, 2 cc of 0.2% ropivacaine, 1 cc of Decadron 10 mg/cc. Imaging Guidance (Spinal):          Type of Imaging Technique: Fluoroscopy Guidance (Spinal) Indication(s): Assistance in needle guidance and placement for procedures requiring needle placement in or near specific anatomical locations not easily accessible without such assistance. Exposure Time: Please see nurses notes. Contrast: Before injecting any contrast, we confirmed that the patient did not have an allergy to iodine, shellfish, or radiological contrast. Once satisfactory needle placement was completed at the desired level, radiological contrast was injected. Contrast injected under live fluoroscopy. No contrast complications. See chart for type and volume of contrast used. Fluoroscopic Guidance: I was personally present during the use of fluoroscopy. "Tunnel Vision Technique" used to obtain the best possible view of the target area. Parallax error corrected before commencing the procedure. "Direction-depth-direction" technique used to introduce the needle under continuous pulsed fluoroscopy. Once target was reached, antero-posterior, oblique, and lateral fluoroscopic projection used confirm needle placement in all planes. Images permanently stored in EMR. Interpretation: I personally interpreted the imaging intraoperatively. Adequate needle placement confirmed in multiple planes. Appropriate spread of contrast into desired area was observed. No  evidence of afferent or efferent intravascular uptake. No intrathecal or subarachnoid spread observed. Permanent images saved into the patient's record.   Post-operative Assessment:  Post-procedure Vital Signs:  Pulse/HCG Rate: 7475 Temp: (!) 97.2 F (36.2 C) Resp: 15 BP: 131/89 SpO2: 95 %  EBL: None  Complications: No immediate post-treatment complications observed by team, or reported by patient.  Note: The patient tolerated the entire procedure well. A repeat set of vitals were taken after the procedure and the patient was kept under observation following institutional policy, for this type of procedure. Post-procedural neurological assessment was performed, showing return to baseline, prior to discharge. The patient was provided with post-procedure discharge instructions, including a section on how to identify potential problems. Should any problems arise concerning this procedure, the patient was given instructions to immediately contact us, at any time, without hesitation. In any case, we plan to contact the patient by telephone for a follow-up status report regarding this interventional procedure.  Comments:  No additional relevant information. 5 out of 5 strength bilateral lower extremity: Plantar flexion, dorsiflexion, knee flexion, knee extension.  Plan of Care   Imaging Orders         DG PAIN CLINIC C-ARM 1-60 MIN NO REPORT     Procedure Orders    No procedure(s) ordered today    Medications ordered for procedure: Meds ordered this encounter  Medications   iohexol (OMNIPAQUE) 180 MG/ML injection 10 mL    Must be Myelogram-compatible. If not available, you may substitute with a water-soluble, non-ionic, hypoallergenic, myelogram-compatible radiological contrast medium.   lidocaine (XYLOCAINE) 2 % (with pres) injection 400 mg   ropivacaine (PF) 2 mg/mL (0.2%) (NAROPIN) injection 2 mL   sodium chloride flush (NS) 0.9 % injection 2 mL   ropivacaine (PF) 2 mg/mL (0.2%)  (NAROPIN) injection 18 mL   dexamethasone (DECADRON) injection 20 mg   methylPREDNISolone acetate (DEPO-MEDROL) injection 80 mg   Medications administered: We administered iohexol, lidocaine, ropivacaine (PF) 2 mg/mL (0.2%), sodium chloride flush, ropivacaine (  PF) 2 mg/mL (0.2%), dexamethasone, and methylPREDNISolone acetate.  See the medical record for exact dosing, route, and time of administration.  Disposition: Discharge home  Discharge Date & Time: 08/17/2023; 1035 hrs.   Physician-requested Follow-up: Return in about 8 weeks (around 10/12/2023) for PPE, F2F.  Future Appointments  Date Time Provider Department Center  08/24/2023  2:30 PM Carman Ching, PA-C [facility] None  10/12/2023  2:00 PM Edward Jolly, MD [facility] None  10/29/2023  2:00 PM Bethann Berkshire None    Primary Care Physician: Rayetta Humphrey, MD Location: [facility] Note by: Edward Jolly, MD Date: 08/17/2023; Time: 11:20 AM  Disclaimer:  Medicine is not an exact science. The only guarantee in medicine is that nothing is guaranteed. It is important to note that the decision to proceed with this intervention was based on the information collected from the patient. The Data and conclusions were drawn from the patient's questionnaire, the interview, and the physical examination. Because the information was provided in large part by the patient, it cannot be guaranteed that it has not been purposely or unconsciously manipulated. Every effort has been made to obtain as much relevant data as possible for this evaluation. It is important to note that the conclusions that lead to this procedure are derived in large part from the available data. Always take into account that the treatment will also be dependent on availability of resources and existing treatment guidelines, considered by other Pain Management Practitioners as being common knowledge and practice, at the time  of the intervention. For Medico-Legal purposes, it is also important to point out that variation in procedural techniques and pharmacological choices are the acceptable norm. The indications, contraindications, technique, and results of the above procedure should only be interpreted and judged by a Board-Certified Interventional Pain Specialist with extensive familiarity and expertise in the same exact procedure and technique.

## 2023-08-18 ENCOUNTER — Telehealth: Payer: Self-pay

## 2023-08-18 NOTE — Telephone Encounter (Signed)
Post procedure follow up.  Patient states she is doing ok.

## 2023-08-24 ENCOUNTER — Ambulatory Visit: Payer: PPO | Admitting: Physician Assistant

## 2023-08-24 ENCOUNTER — Encounter: Payer: Self-pay | Admitting: Physician Assistant

## 2023-08-24 VITALS — BP 80/50 | HR 102 | Ht 68.0 in | Wt 163.0 lb

## 2023-08-24 DIAGNOSIS — Z87440 Personal history of urinary (tract) infections: Secondary | ICD-10-CM | POA: Diagnosis not present

## 2023-08-24 DIAGNOSIS — N3281 Overactive bladder: Secondary | ICD-10-CM

## 2023-08-24 DIAGNOSIS — R9431 Abnormal electrocardiogram [ECG] [EKG]: Secondary | ICD-10-CM | POA: Diagnosis not present

## 2023-08-24 DIAGNOSIS — I340 Nonrheumatic mitral (valve) insufficiency: Secondary | ICD-10-CM | POA: Diagnosis not present

## 2023-08-24 DIAGNOSIS — N390 Urinary tract infection, site not specified: Secondary | ICD-10-CM

## 2023-08-24 DIAGNOSIS — I071 Rheumatic tricuspid insufficiency: Secondary | ICD-10-CM | POA: Diagnosis not present

## 2023-08-24 LAB — BLADDER SCAN AMB NON-IMAGING: Scan Result: 0

## 2023-08-24 NOTE — Progress Notes (Signed)
08/24/2023 3:01 PM   Sarah [name] 1932/01/26 [id number]  CC: Chief Complaint  Patient presents with   Over Active Bladder   HPI: Sarah [name] is a 89 y.o. female with PMH OSA not on CPAP, recurrent UTI on suppressive trimethoprim and cranberry supplements, OAB with nocturia unable to continue Gemtesa due to cost, and GSM on vaginal estrogen cream who presents today for symptom recheck on trospium ER 60 mg.  Today she reports no symptomatic improvement on trospium ER.  She continues to report nocturia every 2 hours.  She is adamant that this is not related to sleep apnea.  She does admit to daytime sleepiness.   She recently ran out of suppressive trimethoprim and wonders if she should continue this.  She continues to use vaginal estrogen cream.  PVR 0 mL.  PMH: Past Medical History:  Diagnosis Date   Benign essential HTN 09/21/2015   Cancer (HCC)    Skin CA resected from Right eyelid and both legs.   Neuropathy    Seizures (HCC)    Thyroid disease     Surgical History: Past Surgical History:  Procedure Laterality Date   ABDOMINAL HYSTERECTOMY     BREAST BIOPSY Bilateral    neg   BREAST EXCISIONAL BIOPSY Right    KNEE ARTHROSCOPY W/ LATERAL RETINACULAR REPAIR      Home Medications:  Allergies as of 08/24/2023       Reactions   Macrobid [nitrofurantoin] Shortness Of Breath   Codeine Other (See Comments)   Makes her disoriented   Hydrocodone Other (See Comments)   Patient states she was seeing things that weren't there and very disoriented.   Oxycodone Other (See Comments)   hallucinations   Peanut-containing Drug Products    Other reaction(s): Headache   Peanuts [peanut Oil] Other (See Comments)   Violent Migraines   Penicillins Nausea Only   Tramadol Other (See Comments)   Dizziness.        Medication List        Accurate as of August 24, 2023  3:01 PM. If you have any questions, ask your nurse or doctor.          STOP  taking these medications    brimonidine 0.2 % ophthalmic solution Commonly known as: ALPHAGAN Stopped by: Carman Ching   gabapentin 300 MG capsule Commonly known as: NEURONTIN Stopped by: Carman Ching   omeprazole 20 MG capsule Commonly known as: PRILOSEC Stopped by: Carman Ching       TAKE these medications    acetaminophen 500 MG tablet Commonly known as: TYLENOL Take 1,000 mg by mouth every 6 (six) hours as needed.   alendronate 70 MG tablet Commonly known as: FOSAMAX Take 70 mg by mouth every 7 (seven) days.   beta carotene w/minerals tablet Take 1 tablet by mouth daily.   dicyclomine 10 MG capsule Commonly known as: BENTYL Take 10 mg by mouth 4 (four) times daily -  before meals and at bedtime.   donepezil 10 MG disintegrating tablet Commonly known as: ARICEPT ODT Take 10 mg by mouth at bedtime.   dorzolamide 2 % ophthalmic solution Commonly known as: TRUSOPT Place 1 drop into both eyes 3 (three) times daily.   DULoxetine HCl 40 MG Cpep Take 40 mg by mouth daily.   estradiol 0.1 MG/GM vaginal cream Commonly known as: ESTRACE Estrogen Cream Instruction Discard applicator Apply pea sized amount to tip of finger to urethra before bed. Wash hands well after application. Use Monday,  Wednesday and Friday   fexofenadine 180 MG tablet Commonly known as: ALLEGRA Take 180 mg by mouth daily.   latanoprost 0.005 % ophthalmic solution Commonly known as: XALATAN   levothyroxine 100 MCG tablet Commonly known as: SYNTHROID Take 100 mcg by mouth daily.   lovastatin 20 MG tablet Commonly known as: MEVACOR Take 20 mg by mouth daily.   Lumigan 0.01 % Soln Generic drug: bimatoprost Place 1 drop into both eyes at bedtime. What changed: Another medication with the same name was removed. Continue taking this medication, and follow the directions you see here. Changed by: Carman Ching   melatonin 1 MG Tabs tablet Take 10 mg by  mouth once.   montelukast 10 MG tablet Commonly known as: SINGULAIR Take 10 mg by mouth daily.   naproxen sodium 220 MG tablet Commonly known as: ALEVE Take 220 mg by mouth.   nortriptyline 10 MG capsule Commonly known as: PAMELOR Take 10 mg by mouth at bedtime.   polyethylene glycol powder 17 GM/SCOOP powder Commonly known as: GLYCOLAX/MIRALAX Take by mouth.   predniSONE 10 MG tablet Commonly known as: DELTASONE Take 1 tablet by mouth daily.   pregabalin 25 MG capsule Commonly known as: LYRICA Take 50 mg by mouth 2 (two) times daily. Patient to increase   Probiotic (Lactobacillus) Caps Take by mouth.   simvastatin 10 MG tablet Commonly known as: ZOCOR Take 10 mg by mouth at bedtime.   trimethoprim 100 MG tablet Commonly known as: TRIMPEX Take 1 tablet (100 mg total) by mouth daily.   Trospium Chloride 60 MG Cp24 Take 1 capsule (60 mg total) by mouth daily.        Allergies:  Allergies  Allergen Reactions   Macrobid [Nitrofurantoin] Shortness Of Breath   Codeine Other (See Comments)    Makes her disoriented   Hydrocodone Other (See Comments)    Patient states she was seeing things that weren't there and very disoriented.   Oxycodone Other (See Comments)    hallucinations   Peanut-Containing Drug Products     Other reaction(s): Headache   Peanuts [Peanut Oil] Other (See Comments)    Violent Migraines   Penicillins Nausea Only   Tramadol Other (See Comments)    Dizziness.    Family History: Family History  Problem Relation Age of Onset   Breast cancer Mother 53   Breast cancer Maternal Uncle 51   Breast cancer Maternal Aunt        mat great aunt   Breast cancer Maternal Aunt 70   Tracheal cancer Father    Cancer Sister    Brain cancer Sister    Cancer Brother     Social History:   reports that she has never smoked. She has never used smokeless tobacco. She reports that she does not drink alcohol. No history on file for drug use.  Physical  Exam: BP (!) 80/50   Pulse (!) 102   Ht 5\' 8"  (1.727 m)   Wt 163 lb (73.9 kg)   BMI 24.78 kg/m   Constitutional:  Alert and oriented, no acute distress, nontoxic appearing HEENT: [place], AT Cardiovascular: No clubbing, cyanosis, or edema Respiratory: Normal respiratory effort, no increased work of breathing Skin: No rashes, bruises or suspicious lesions Neurologic: Grossly intact, no focal deficits, moving all 4 extremities Psychiatric: Normal mood and affect  Laboratory Data: Results for orders placed or performed in visit on 08/24/23  Bladder Scan (Post Void Residual) in office  Result Value Ref Range   Scan  Result 0    Assessment & Plan:   1. OAB (overactive bladder) Stop trospium.  Unable to afford Gemtesa.  We discussed PTNS and she would like to pursue this.  No cardiac pacemaker.  Will call her to schedule.  I think sleep apnea is likely contributory to her nocturia, however she remains very resistant to this idea. - Bladder Scan (Post Void Residual) in office  2. Recurrent UTI Will stop suppressive antibiotics per patient preference and consider resuming in the future if she continues to have frequent infections.  I encouraged her to continue topical vaginal estrogen cream indefinitely.  Return in about 2 weeks (around 09/07/2023) for PTNS.  Carman Ching, PA-C  [facility] [place] [address] [place], [place] [address] [phone]

## 2023-08-27 ENCOUNTER — Telehealth: Payer: Self-pay

## 2023-08-27 NOTE — Telephone Encounter (Signed)
-----   Message from Carman Ching sent at 08/24/2023  3:02 PM EST ----- Regarding: PTNS Please book her, thanks!

## 2023-08-27 NOTE — Telephone Encounter (Signed)
Left detailed message for patient letting her know that PA is not required and co pay will be $20 each time x 12 weeks. Will not be able to start booking appointments until January and to let us know if she changes insurances. Asked patient to call us back when she is ready to schedule.

## 2023-08-28 DIAGNOSIS — J40 Bronchitis, not specified as acute or chronic: Secondary | ICD-10-CM | POA: Diagnosis not present

## 2023-09-21 DIAGNOSIS — W19XXXA Unspecified fall, initial encounter: Secondary | ICD-10-CM | POA: Diagnosis not present

## 2023-09-21 DIAGNOSIS — M25552 Pain in left hip: Secondary | ICD-10-CM | POA: Diagnosis not present

## 2023-09-24 DIAGNOSIS — M25552 Pain in left hip: Secondary | ICD-10-CM | POA: Diagnosis not present

## 2023-09-28 DIAGNOSIS — J40 Bronchitis, not specified as acute or chronic: Secondary | ICD-10-CM | POA: Diagnosis not present

## 2023-09-29 ENCOUNTER — Ambulatory Visit: Payer: PPO | Admitting: Physician Assistant

## 2023-09-29 DIAGNOSIS — N3281 Overactive bladder: Secondary | ICD-10-CM | POA: Diagnosis not present

## 2023-09-29 DIAGNOSIS — M7062 Trochanteric bursitis, left hip: Secondary | ICD-10-CM | POA: Diagnosis not present

## 2023-09-29 NOTE — Progress Notes (Signed)
 PTNS  Session # 1 of 45  Health & Social Factors: no change Caffeine: 1 Alcohol: 0 Daytime voids #per day: 10 Night-time voids #per night: 3 Urgency: strong Incontinence Episodes #per day: 0 Ankle used: left Treatment Setting: 8 Feeling/ Response: sensory Comments: Patient tolerated well. She watched the video and signed the consent.  Performed By: Mauro Arps, PA-C   Follow Up: 1 week

## 2023-09-29 NOTE — Patient Instructions (Signed)

## 2023-09-30 DIAGNOSIS — M542 Cervicalgia: Secondary | ICD-10-CM | POA: Diagnosis not present

## 2023-09-30 DIAGNOSIS — G629 Polyneuropathy, unspecified: Secondary | ICD-10-CM | POA: Diagnosis not present

## 2023-09-30 DIAGNOSIS — R202 Paresthesia of skin: Secondary | ICD-10-CM | POA: Diagnosis not present

## 2023-09-30 DIAGNOSIS — R519 Headache, unspecified: Secondary | ICD-10-CM | POA: Diagnosis not present

## 2023-09-30 DIAGNOSIS — R252 Cramp and spasm: Secondary | ICD-10-CM | POA: Diagnosis not present

## 2023-09-30 DIAGNOSIS — R296 Repeated falls: Secondary | ICD-10-CM | POA: Diagnosis not present

## 2023-09-30 DIAGNOSIS — R200 Anesthesia of skin: Secondary | ICD-10-CM | POA: Diagnosis not present

## 2023-09-30 DIAGNOSIS — M545 Low back pain, unspecified: Secondary | ICD-10-CM | POA: Diagnosis not present

## 2023-09-30 DIAGNOSIS — R413 Other amnesia: Secondary | ICD-10-CM | POA: Diagnosis not present

## 2023-09-30 DIAGNOSIS — G8929 Other chronic pain: Secondary | ICD-10-CM | POA: Diagnosis not present

## 2023-09-30 DIAGNOSIS — R278 Other lack of coordination: Secondary | ICD-10-CM | POA: Diagnosis not present

## 2023-10-06 ENCOUNTER — Ambulatory Visit: Payer: PPO | Admitting: Physician Assistant

## 2023-10-06 DIAGNOSIS — N3281 Overactive bladder: Secondary | ICD-10-CM

## 2023-10-06 NOTE — Progress Notes (Signed)
 PTNS  Session # 2 of 45  Health & Social Factors: no change Caffeine: 1 Alcohol: 0 Daytime voids #per day: 6-7 Night-time voids #per night: 3 Urgency: none Incontinence Episodes #per day: none Ankle used: left Treatment Setting: 7 Feeling/ Response: sensory Comments: Patient tolerated well.  Performed By: Akyah Lagrange, PA-C   Follow Up: 1 week

## 2023-10-06 NOTE — Patient Instructions (Signed)

## 2023-10-08 ENCOUNTER — Ambulatory Visit: Payer: PPO | Admitting: Student in an Organized Health Care Education/Training Program

## 2023-10-12 ENCOUNTER — Ambulatory Visit: Payer: PPO | Admitting: Student in an Organized Health Care Education/Training Program

## 2023-10-13 ENCOUNTER — Encounter: Payer: Self-pay | Admitting: Physician Assistant

## 2023-10-13 ENCOUNTER — Ambulatory Visit: Payer: PPO | Admitting: Physician Assistant

## 2023-10-13 DIAGNOSIS — N3281 Overactive bladder: Secondary | ICD-10-CM

## 2023-10-13 NOTE — Patient Instructions (Signed)

## 2023-10-13 NOTE — Progress Notes (Signed)
PTNS  Session # 3 of 45  Health & Social Factors: no change Caffeine: 1 Alcohol: 0 Daytime voids #per day: 6-7 Night-time voids #per night: 3 Urgency: strong Incontinence Episodes #per day: 0 Ankle used: left Treatment Setting: 11 Feeling/ Response: sensory Comments: Patient tolerated well  Performed By: Carman Ching, PA-C   Follow Up: 1 week

## 2023-10-19 ENCOUNTER — Other Ambulatory Visit: Payer: PPO

## 2023-10-19 ENCOUNTER — Encounter: Payer: Self-pay | Admitting: Student in an Organized Health Care Education/Training Program

## 2023-10-19 ENCOUNTER — Ambulatory Visit
Payer: PPO | Attending: Student in an Organized Health Care Education/Training Program | Admitting: Student in an Organized Health Care Education/Training Program

## 2023-10-19 VITALS — BP 135/81 | HR 76 | Temp 97.7°F | Resp 16 | Ht 68.0 in | Wt 166.0 lb

## 2023-10-19 DIAGNOSIS — M5416 Radiculopathy, lumbar region: Secondary | ICD-10-CM | POA: Diagnosis not present

## 2023-10-19 DIAGNOSIS — G894 Chronic pain syndrome: Secondary | ICD-10-CM

## 2023-10-19 DIAGNOSIS — M5126 Other intervertebral disc displacement, lumbar region: Secondary | ICD-10-CM

## 2023-10-19 DIAGNOSIS — N390 Urinary tract infection, site not specified: Secondary | ICD-10-CM

## 2023-10-19 LAB — MICROSCOPIC EXAMINATION: WBC, UA: >30 /[HPF] — AB (ref 0–5)

## 2023-10-19 LAB — URINALYSIS, COMPLETE
Bilirubin, UA: NEGATIVE
Glucose, UA: NEGATIVE
Ketones, UA: NEGATIVE
Nitrite, UA: NEGATIVE
Protein,UA: NEGATIVE
Specific Gravity, UA: 1.01 (ref 1.005–1.030)
Urobilinogen, Ur: 0.2 mg/dL (ref 0.2–1.0)
pH, UA: 6.5 (ref 5.0–7.5)

## 2023-10-19 NOTE — Progress Notes (Signed)
PROVIDER NOTE: Information contained herein reflects review and annotations entered in association with encounter. Interpretation of such information and data should be left to medically-trained personnel. Information provided to patient can be located elsewhere in the medical record under "Patient Instructions". Document created using STT-dictation technology, any transcriptional errors that may result from process are unintentional.    Patient: Sarah [name]  Service Category: E/M  Provider: Edward Jolly, MD  DOB: Oct 08, 1931  DOS: 10/19/2023  Referring Provider: Rayetta Humphrey, MD  MRN: [id number]  Specialty: Interventional Pain Management  PCP: Rayetta Humphrey, MD  Type: Established Patient  Setting: Ambulatory outpatient    Location: Office  Delivery: Face-to-face     HPI  [name], a 89 y.o. year old female, is here today because of her Lumbar disc herniation [M51.26]. [name] primary complain today is Back Pain (low)  Pertinent problems: [name] has Spondylosis of lumbar joint; Lumbar radiculopathy; Lumbar disc herniation (L2/L3); Lumbar degenerative disc disease; Chronic pain syndrome; Neuropathy; Thyroid nodule; and Bilateral occipital neuralgia on their pertinent problem list. Pain Assessment: Severity of Chronic pain is reported as a 6 /10. Location: Back Lower/dennies. Onset: More than a month ago. Quality: Constant. Timing: Constant. Modifying factor(s): meds, proc. Vitals:  height is 5\' 8"  (1.727 m) and weight is 166 lb (75.3 kg). Her temperature is 97.7 F (36.5 C). Her blood pressure is 135/81 and her pulse is 76. Her respiration is 16 and oxygen saturation is 96%.  BMI: Estimated body mass index is 25.24 kg/m as calculated from the following:   Height as of this encounter: 5\' 8"  (1.727 m).   Weight as of this encounter: 166 lb (75.3 kg). Last encounter: 07/29/2023. Last procedure: 08/17/2023.  Reason for encounter: follow-up evaluation.    Increased low back pain with occasional radiation into bilateral buttocks.  Previous L2-L3 ESI was not 12/25/2021.  We discussed repeating.    ROS  Constitutional: Denies any fever or chills Gastrointestinal: No reported hemesis, hematochezia, vomiting, or acute GI distress Musculoskeletal:  Low back pain with occasional radiation into the buttocks Neurological: No reported episodes of acute onset apraxia, aphasia, dysarthria, agnosia, amnesia, paralysis, loss of coordination, or loss of consciousness  Medication Review  Apoaequorin, DULoxetine HCl, Probiotic (Lactobacillus), acetaminophen, alendronate, beta carotene w/minerals, bimatoprost, dicyclomine, donepezil, dorzolamide, estradiol, fexofenadine, latanoprost, levothyroxine, lovastatin, melatonin, montelukast, naproxen sodium, nortriptyline, polyethylene glycol powder, predniSONE, pregabalin, and simvastatin  History Review  Allergy: [name] is allergic to macrobid [nitrofurantoin], codeine, hydrocodone, oxycodone, peanut-containing drug products, peanuts [peanut oil], penicillins, and tramadol. Drug: [name]  has no history on file for drug use. Alcohol:  reports no history of alcohol use. Tobacco:  reports that she has never smoked. She has never used smokeless tobacco. Social: [name]  reports that she has never smoked. She has never used smokeless tobacco. She reports that she does not drink alcohol. Medical:  has a past medical history of Benign essential HTN (09/21/2015), Cancer (HCC), Neuropathy, Seizures (HCC), and Thyroid disease. Surgical: [name]  has a past surgical history that includes Abdominal hysterectomy; Breast biopsy (Bilateral); Breast excisional biopsy (Right); and Knee arthroscopy w/ lateral retinacular repair. Family: family history includes Brain cancer in her sister; Breast cancer in her maternal aunt; Breast cancer (age of onset: 12) in her maternal aunt; Breast cancer (age of onset: 69) in her  mother; Breast cancer (age of onset: 63) in her maternal uncle; Cancer in her brother and sister; Tracheal cancer in her father.  Laboratory Chemistry Profile   Renal Lab Results  Component Value Date   BUN 23 (H) 09/14/2015   CREATININE 1.20 (H) 12/26/2022   GFRAA 77 09/12/2020   GFRNONAA 67 09/12/2020    Hepatic Lab Results  Component Value Date   AST 19 11/26/2015   ALT 15 11/26/2015   ALBUMIN 4.7 11/26/2015   ALKPHOS 64 11/26/2015    Electrolytes Lab Results  Component Value Date   NA 141 09/14/2015   K 3.5 09/14/2015   CL 107 09/14/2015   CALCIUM 9.7 09/14/2015   MG 2.3 01/20/2023    Bone No results found for: "VD25OH", "VD125OH2TOT", "FA2130QM5", "HQ4696EX5", "25OHVITD1", "25OHVITD2", "25OHVITD3", "TESTOFREE", "TESTOSTERONE"  Inflammation (CRP: Acute Phase) (ESR: Chronic Phase) No results found for: "CRP", "ESRSEDRATE", "LATICACIDVEN"       Note: Above Lab results reviewed.    Physical Exam  General appearance: Well nourished, well developed, and well hydrated. In no apparent acute distress Mental status: Alert, oriented x 3 (person, place, & time)       Respiratory: No evidence of acute respiratory distress Eyes: PERLA Vitals: BP 135/81   Pulse 76   Temp 97.7 F (36.5 C)   Resp 16   Ht 5\' 8"  (1.727 m)   Wt 166 lb (75.3 kg)   SpO2 96%   BMI 25.24 kg/m  BMI: Estimated body mass index is 25.24 kg/m as calculated from the following:   Height as of this encounter: 5\' 8"  (1.727 m).   Weight as of this encounter: 166 lb (75.3 kg). Ideal: Ideal body weight: 63.9 kg (140 lb 14 oz) Adjusted ideal body weight: 68.5 kg (150 lb 14.8 oz)  Low back pain with occasional radiation into buttocks  Assessment   Diagnosis  1. Lumbar disc herniation (L2/L3)   2. Lumbar radiculopathy   3. Chronic pain syndrome      Updated Problems: No problems updated.  Plan of Care  Increased lumbar radicular pain, follow-up for lumbar epidural steroid injection as  below.  Orders:  Orders Placed This Encounter  Procedures   Lumbar Epidural Injection    Standing Status:   Future    Expected Date:   11/04/2023    Expiration Date:   01/17/2024    Scheduling Instructions:     Procedure: Interlaminar Lumbar Epidural Steroid injection (LESI)            Laterality: Midline     Sedation: Patient's choice.     Timeframe: ASAA    Where will this procedure be performed?:   [facility]   Follow-up plan:   Return in about 16 days (around 11/04/2023) for L2/3 ESI, in clinic NS.      Recent Visits Date Type Provider Dept  08/17/23 Procedure visit Edward Jolly, MD [facility]  07/29/23 Office Visit Edward Jolly, MD [facility]  Showing recent visits within past 90 days and meeting all other requirements Today's Visits Date Type Provider Dept  10/19/23 Office Visit Edward Jolly, MD [facility]  Showing today's visits and meeting all other requirements Future Appointments Date Type Provider Dept  11/04/23 Appointment Edward Jolly, MD [facility]  Showing future appointments within next 90 days and meeting all other requirements  I discussed the assessment and treatment plan with the patient. The patient was provided an opportunity to ask questions and all were answered. The patient agreed with the plan and demonstrated an understanding of the instructions.  Patient advised to call back or seek an in-person  evaluation if the symptoms or condition worsens.  Duration of encounter: .  Total time on encounter, as per AMA guidelines included both the face-to-face and non-face-to-face time personally spent by the physician and/or other qualified health care professional(s) on the day of the encounter (includes time in activities that require the physician or other qualified health care professional and does not include time in activities normally performed by clinical staff). Physician's time may include  the following activities when performed: Preparing to see the patient (e.g., pre-charting review of records, searching for previously ordered imaging, lab work, and nerve conduction tests) Review of prior analgesic pharmacotherapies. Reviewing PMP Interpreting ordered tests (e.g., lab work, imaging, nerve conduction tests) Performing post-procedure evaluations, including interpretation of diagnostic procedures Obtaining and/or reviewing separately obtained history Performing a medically appropriate examination and/or evaluation Counseling and educating the patient/family/caregiver Ordering medications, tests, or procedures Referring and communicating with other health care professionals (when not separately reported) Documenting clinical information in the electronic or other health record Independently interpreting results (not separately reported) and communicating results to the patient/ family/caregiver Care coordination (not separately reported)  Note by: Edward Jolly, MD Date: 10/19/2023; Time: 3:27 PM

## 2023-10-19 NOTE — Patient Instructions (Signed)

## 2023-10-19 NOTE — Progress Notes (Signed)
Safety precautions to be maintained throughout the outpatient stay will include: orient to surroundings, keep bed in low position, maintain call bell within reach at all times, provide assistance with transfer out of bed and ambulation.

## 2023-10-20 ENCOUNTER — Ambulatory Visit: Payer: PPO | Admitting: Physician Assistant

## 2023-10-20 VITALS — BP 135/75

## 2023-10-20 DIAGNOSIS — I10 Essential (primary) hypertension: Secondary | ICD-10-CM | POA: Diagnosis not present

## 2023-10-20 DIAGNOSIS — N3281 Overactive bladder: Secondary | ICD-10-CM

## 2023-10-20 DIAGNOSIS — N3000 Acute cystitis without hematuria: Secondary | ICD-10-CM | POA: Diagnosis not present

## 2023-10-20 DIAGNOSIS — N1831 Chronic kidney disease, stage 3a: Secondary | ICD-10-CM | POA: Diagnosis not present

## 2023-10-20 DIAGNOSIS — R8281 Pyuria: Secondary | ICD-10-CM | POA: Diagnosis not present

## 2023-10-20 DIAGNOSIS — R82998 Other abnormal findings in urine: Secondary | ICD-10-CM | POA: Diagnosis not present

## 2023-10-20 DIAGNOSIS — F339 Major depressive disorder, recurrent, unspecified: Secondary | ICD-10-CM | POA: Diagnosis not present

## 2023-10-20 DIAGNOSIS — N390 Urinary tract infection, site not specified: Secondary | ICD-10-CM

## 2023-10-20 DIAGNOSIS — R7303 Prediabetes: Secondary | ICD-10-CM | POA: Diagnosis not present

## 2023-10-20 MED ORDER — CEFUROXIME AXETIL 250 MG PO TABS: 250.0000 mg | ORAL_TABLET | Freq: Two times a day (BID) | ORAL | 0 refills | Status: AC

## 2023-10-20 NOTE — Progress Notes (Signed)
PTNS  Session # 4 of 45  Health & Social Factors: UTI this week; see separate note. Caffeine: 1 Alcohol: 0 Daytime voids #per day: 6 Night-time voids #per night: 2-3 Urgency: mild Incontinence Episodes #per day: 0 Ankle used: left Treatment Setting: 17 Feeling/ Response: sensory Comments: Patient tolerated well.  Performed By: Carman Ching, PA-C   Follow Up: 1 week

## 2023-10-20 NOTE — Progress Notes (Signed)
10/20/2023 10:52 AM   Sarah [name] 1931/10/16 [id number]  CC: Chief Complaint  Patient presents with   PTNS   HPI: Sarah [name] is a 89 y.o. female with PMH OSA not on CPAP, recurrent UTI previously on suppressive trimethoprim, OAB with nocturia who failed Gemtesa due to cost now on PTNS, and GSM on vaginal estrogen cream who presents today for weekly PTNS and for evaluation of possible UTI.   Today she reports sudden onset chills with urination starting 3 days ago.  She denies fever, nausea, or vomiting.  She has not taken any medication for this, though she does remain on daily cranberry supplements.  She dropped off a urine specimen yesterday.  UA was positive for 2+ leukocytes and trace blood; urine microscopy with >30 WBCs/HPF.  PMH: Past Medical History:  Diagnosis Date   Benign essential HTN 09/21/2015   Cancer (HCC)    Skin CA resected from Right eyelid and both legs.   Neuropathy    Seizures (HCC)    Thyroid disease     Surgical History: Past Surgical History:  Procedure Laterality Date   ABDOMINAL HYSTERECTOMY     BREAST BIOPSY Bilateral    neg   BREAST EXCISIONAL BIOPSY Right    KNEE ARTHROSCOPY W/ LATERAL RETINACULAR REPAIR      Home Medications:  Allergies as of 10/20/2023       Reactions   Macrobid [nitrofurantoin] Shortness Of Breath   Codeine Other (See Comments)   Makes her disoriented   Hydrocodone Other (See Comments)   Patient states she was seeing things that weren't there and very disoriented.   Oxycodone Other (See Comments)   hallucinations   Peanut-containing Drug Products    Other reaction(s): Headache   Peanuts [peanut Oil] Other (See Comments)   Violent Migraines   Penicillins Nausea Only   Tramadol Other (See Comments)   Dizziness.        Medication List        Accurate as of October 20, 2023 10:52 AM. If you have any questions, ask your nurse or doctor.          acetaminophen 500 MG  tablet Commonly known as: TYLENOL Take 1,000 mg by mouth every 6 (six) hours as needed.   alendronate 70 MG tablet Commonly known as: FOSAMAX Take 70 mg by mouth every 7 (seven) days.   beta carotene w/minerals tablet Take 1 tablet by mouth daily.   dicyclomine 10 MG capsule Commonly known as: BENTYL Take 10 mg by mouth 4 (four) times daily -  before meals and at bedtime.   donepezil 10 MG disintegrating tablet Commonly known as: ARICEPT ODT Take 10 mg by mouth at bedtime.   dorzolamide 2 % ophthalmic solution Commonly known as: TRUSOPT Place 1 drop into both eyes 3 (three) times daily.   DULoxetine HCl 40 MG Cpep Take 40 mg by mouth daily.   estradiol 0.1 MG/GM vaginal cream Commonly known as: ESTRACE Estrogen Cream Instruction Discard applicator Apply pea sized amount to tip of finger to urethra before bed. Wash hands well after application. Use Monday, Wednesday and Friday   fexofenadine 180 MG tablet Commonly known as: ALLEGRA Take 180 mg by mouth daily.   latanoprost 0.005 % ophthalmic solution Commonly known as: XALATAN   levothyroxine 100 MCG tablet Commonly known as: SYNTHROID Take 100 mcg by mouth daily.   lovastatin 20 MG tablet Commonly known as: MEVACOR Take 20 mg by mouth daily.   Lumigan 0.01 %  Soln Generic drug: bimatoprost Place 1 drop into both eyes at bedtime.   melatonin 1 MG Tabs tablet Take 10 mg by mouth once.   montelukast 10 MG tablet Commonly known as: SINGULAIR Take 10 mg by mouth daily.   naproxen sodium 220 MG tablet Commonly known as: ALEVE Take 220 mg by mouth.   nortriptyline 10 MG capsule Commonly known as: PAMELOR Take 10 mg by mouth at bedtime.   polyethylene glycol powder 17 GM/SCOOP powder Commonly known as: GLYCOLAX/MIRALAX Take by mouth.   predniSONE 10 MG tablet Commonly known as: DELTASONE Take 1 tablet by mouth daily.   pregabalin 25 MG capsule Commonly known as: LYRICA Take 50 mg by mouth 2 (two) times  daily. Patient to increase   PREVAGEN PO Take 75 mg by mouth in the morning, at noon, and at bedtime.   Probiotic (Lactobacillus) Caps Take by mouth.   simvastatin 10 MG tablet Commonly known as: ZOCOR Take 10 mg by mouth at bedtime.        Allergies:  Allergies  Allergen Reactions   Macrobid [Nitrofurantoin] Shortness Of Breath   Codeine Other (See Comments)    Makes her disoriented   Hydrocodone Other (See Comments)    Patient states she was seeing things that weren't there and very disoriented.   Oxycodone Other (See Comments)    hallucinations   Peanut-Containing Drug Products     Other reaction(s): Headache   Peanuts [Peanut Oil] Other (See Comments)    Violent Migraines   Penicillins Nausea Only   Tramadol Other (See Comments)    Dizziness.    Family History: Family History  Problem Relation Age of Onset   Breast cancer Mother 43   Breast cancer Maternal Uncle 16   Breast cancer Maternal Aunt        mat great aunt   Breast cancer Maternal Aunt 5   Tracheal cancer Father    Cancer Sister    Brain cancer Sister    Cancer Brother     Social History:   reports that she has never smoked. She has never used smokeless tobacco. She reports that she does not drink alcohol. No history on file for drug use.  Physical Exam: BP 135/75   Constitutional:  Alert and oriented, no acute distress, nontoxic appearing HEENT: , AT Cardiovascular: No clubbing, cyanosis, or edema Respiratory: Normal respiratory effort, no increased work of breathing Skin: No rashes, bruises or suspicious lesions Neurologic: Grossly intact, no focal deficits, moving all 4 extremities Psychiatric: Normal mood and affect  Laboratory Data: Results for orders placed or performed in visit on 10/19/23  Microscopic Examination   Collection Time: 10/19/23  1:20 PM   Urine  Result Value Ref Range   WBC, UA >30 (A) 0 - 5 /hpf   RBC, Urine 0-2 0 - 2 /hpf   Epithelial Cells (non renal) 0-10  0 - 10 /hpf   Bacteria, UA Few None seen/Few  Urinalysis, Complete   Collection Time: 10/19/23  1:20 PM  Result Value Ref Range   Specific Gravity, UA 1.010 1.005 - 1.030   pH, UA 6.5 5.0 - 7.5   Color, UA Yellow Yellow   Appearance Ur Hazy (A) Clear   Leukocytes,UA 2+ (A) Negative   Protein,UA Negative Negative/Trace   Glucose, UA Negative Negative   Ketones, UA Negative Negative   RBC, UA Trace (A) Negative   Bilirubin, UA Negative Negative   Urobilinogen, Ur 0.2 0.2 - 1.0 mg/dL   Nitrite, UA  Negative Negative   Microscopic Examination See below:    Assessment & Plan:   1. OAB (overactive bladder) (Primary) Equally PTNS performed today, see separate procedure note. - PTNS-Percutaneous Tibial Nerve Stimulati  2. Recurrent UTI UA notable for pyuria.  Culture pending.  Will start empiric cefuroxime. - cefUROXime (CEFTIN) 250 MG tablet; Take 1 tablet (250 mg total) by mouth 2 (two) times daily with a meal for 5 days.  Dispense: 10 tablet; Refill: 0   Return if symptoms worsen or fail to improve.  Carman Ching, PA-C  [facility] [address] [place], [place] [address] [phone]

## 2023-10-20 NOTE — Patient Instructions (Signed)

## 2023-10-22 LAB — CULTURE, URINE COMPREHENSIVE

## 2023-10-23 ENCOUNTER — Emergency Department: Payer: PPO

## 2023-10-23 ENCOUNTER — Other Ambulatory Visit: Payer: Self-pay

## 2023-10-23 ENCOUNTER — Observation Stay
Admission: EM | Admit: 2023-10-23 | Discharge: 2023-10-24 | Disposition: A | Discharge status: Home / Self Care | Payer: PPO | Attending: Internal Medicine | Admitting: Internal Medicine

## 2023-10-23 DIAGNOSIS — I10 Essential (primary) hypertension: Secondary | ICD-10-CM | POA: Diagnosis not present

## 2023-10-23 DIAGNOSIS — E785 Hyperlipidemia, unspecified: Secondary | ICD-10-CM | POA: Diagnosis present

## 2023-10-23 DIAGNOSIS — I110 Hypertensive heart disease with heart failure: Secondary | ICD-10-CM | POA: Diagnosis not present

## 2023-10-23 DIAGNOSIS — Z79899 Other long term (current) drug therapy: Secondary | ICD-10-CM | POA: Diagnosis not present

## 2023-10-23 DIAGNOSIS — N3000 Acute cystitis without hematuria: Secondary | ICD-10-CM

## 2023-10-23 DIAGNOSIS — Z66 Do not resuscitate: Secondary | ICD-10-CM | POA: Diagnosis not present

## 2023-10-23 DIAGNOSIS — Z91010 Allergy to peanuts: Secondary | ICD-10-CM | POA: Insufficient documentation

## 2023-10-23 DIAGNOSIS — S42291A Other displaced fracture of upper end of right humerus, initial encounter for closed fracture: Secondary | ICD-10-CM | POA: Diagnosis not present

## 2023-10-23 DIAGNOSIS — N390 Urinary tract infection, site not specified: Secondary | ICD-10-CM | POA: Diagnosis not present

## 2023-10-23 DIAGNOSIS — S4291XA Fracture of right shoulder girdle, part unspecified, initial encounter for closed fracture: Secondary | ICD-10-CM | POA: Diagnosis present

## 2023-10-23 DIAGNOSIS — Y9289 Other specified places as the place of occurrence of the external cause: Secondary | ICD-10-CM | POA: Diagnosis not present

## 2023-10-23 DIAGNOSIS — Z85828 Personal history of other malignant neoplasm of skin: Secondary | ICD-10-CM | POA: Diagnosis not present

## 2023-10-23 DIAGNOSIS — E039 Hypothyroidism, unspecified: Secondary | ICD-10-CM | POA: Insufficient documentation

## 2023-10-23 DIAGNOSIS — S0990XA Unspecified injury of head, initial encounter: Secondary | ICD-10-CM | POA: Diagnosis not present

## 2023-10-23 DIAGNOSIS — I5032 Chronic diastolic (congestive) heart failure: Secondary | ICD-10-CM | POA: Diagnosis present

## 2023-10-23 DIAGNOSIS — M19011 Primary osteoarthritis, right shoulder: Secondary | ICD-10-CM | POA: Diagnosis not present

## 2023-10-23 DIAGNOSIS — S42211A Unspecified displaced fracture of surgical neck of right humerus, initial encounter for closed fracture: Secondary | ICD-10-CM | POA: Diagnosis not present

## 2023-10-23 DIAGNOSIS — Y92009 Unspecified place in unspecified non-institutional (private) residence as the place of occurrence of the external cause: Secondary | ICD-10-CM | POA: Diagnosis not present

## 2023-10-23 DIAGNOSIS — W19XXXA Unspecified fall, initial encounter: Secondary | ICD-10-CM | POA: Diagnosis not present

## 2023-10-23 DIAGNOSIS — M25511 Pain in right shoulder: Secondary | ICD-10-CM | POA: Diagnosis present

## 2023-10-23 DIAGNOSIS — S199XXA Unspecified injury of neck, initial encounter: Secondary | ICD-10-CM | POA: Diagnosis not present

## 2023-10-23 DIAGNOSIS — S42201A Unspecified fracture of upper end of right humerus, initial encounter for closed fracture: Secondary | ICD-10-CM | POA: Diagnosis not present

## 2023-10-23 LAB — BASIC METABOLIC PANEL
Anion gap: 12 (ref 5–15)
BUN: 15 mg/dL (ref 8–23)
CO2: 27 mmol/L (ref 22–32)
Calcium: 9.9 mg/dL (ref 8.9–10.3)
Chloride: 103 mmol/L (ref 98–111)
Creatinine, Ser: 0.91 mg/dL (ref 0.44–1.00)
GFR, Estimated: >60 mL/min (ref >60)
Glucose, Bld: 177 mg/dL — ABNORMAL HIGH (ref 70–99)
Potassium: 3.8 mmol/L (ref 3.5–5.1)
Sodium: 142 mmol/L (ref 135–145)

## 2023-10-23 LAB — CBC
HCT: 38.4 % (ref 36.0–46.0)
Hemoglobin: 13 g/dL (ref 12.0–15.0)
MCH: 31.5 pg (ref 26.0–34.0)
MCHC: 33.9 g/dL (ref 30.0–36.0)
MCV: 93 fL (ref 80.0–100.0)
Platelets: 152 10*3/uL (ref 150–400)
RBC: 4.13 MIL/uL (ref 3.87–5.11)
RDW: 12.9 % (ref 11.5–15.5)
WBC: 6.5 10*3/uL (ref 4.0–10.5)
nRBC: 0 % (ref 0.0–0.2)

## 2023-10-23 LAB — TROPONIN I (HIGH SENSITIVITY): Troponin I (High Sensitivity): 4 ng/L (ref <18)

## 2023-10-23 MED ORDER — METHOCARBAMOL 500 MG PO TABS: 500.0000 mg | ORAL_TABLET | Freq: Three times a day (TID) | ORAL | Status: DC | PRN

## 2023-10-23 MED ORDER — LORAZEPAM 2 MG/ML IJ SOLN: 2.0000 mg | INTRAMUSCULAR | Status: DC | PRN

## 2023-10-23 MED ORDER — LIDOCAINE 5 % EX PTCH
1.0000 | MEDICATED_PATCH | CUTANEOUS | Status: DC
  Administered 2023-10-24: 1 via TRANSDERMAL
  Filled 2023-10-23: qty 1

## 2023-10-23 MED ORDER — HYDRALAZINE HCL 20 MG/ML IJ SOLN: 5.0000 mg | INTRAMUSCULAR | Status: DC | PRN

## 2023-10-23 MED ORDER — ACETAMINOPHEN 325 MG PO TABS: 650.0000 mg | ORAL_TABLET | Freq: Four times a day (QID) | ORAL | Status: DC | PRN

## 2023-10-23 MED ORDER — FENTANYL CITRATE PF 50 MCG/ML IJ SOSY
12.5000 ug | PREFILLED_SYRINGE | INTRAMUSCULAR | Status: DC | PRN
  Administered 2023-10-24 (×2): 12.5 ug via INTRAVENOUS
  Filled 2023-10-23 (×2): qty 1

## 2023-10-23 MED ORDER — ONDANSETRON HCL 4 MG/2ML IJ SOLN: 4.0000 mg | Freq: Three times a day (TID) | INTRAMUSCULAR | Status: DC | PRN

## 2023-10-23 MED ORDER — FENTANYL CITRATE PF 50 MCG/ML IJ SOSY
25.0000 ug | PREFILLED_SYRINGE | Freq: Once | INTRAMUSCULAR | Status: AC
  Administered 2023-10-23: 25 ug via INTRAVENOUS
  Filled 2023-10-23: qty 1

## 2023-10-23 MED ORDER — ACETAMINOPHEN 500 MG PO TABS
1000.0000 mg | ORAL_TABLET | Freq: Once | ORAL | Status: AC
  Administered 2023-10-23: 1000 mg via ORAL
  Filled 2023-10-23: qty 2

## 2023-10-23 NOTE — ED Triage Notes (Signed)
Pt to ed from home via [facility] for a fall. Pt denies hitting her head, denies blood thinners, denies LOC. Pt has right arm pain. Pt is caox4, in no acute distress in triage.

## 2023-10-23 NOTE — H&P (Incomplete)
History and Physical    Sarah [name] ZOX:[id number] DOB: [birth year] DOA: 10/23/2023  Referring MD/NP/PA:   PCP: Rayetta Humphrey, MD   Patient coming from:  The patient is coming from home.     Chief Complaint: fall and right shoulder pain  HPI: Sarah [name] is a 89 y.o. female with medical history significant of HTN, HLD, dCHF, hypothyroidism, chronic pain, IBS, skin cancer, who presents with fall, right shoulder pain.  Patient states that at about 530, she got up from a chair, going to the bathroom, felt lightheaded and fell, injured her right shoulder, causing pain in the right shoulder.  She is not very sure if she passed out.  Denies unilateral numbness or tingling in extremities.  No facial droop or slurred speech.  No head or neck injury.  Her pain in right shoulder is constant, sharp, severe, nonradiating, aggravated by movement.  Patient does not have chest pain, cough, SOB.  No fever or chills.  No nausea, vomiting, diarrhea or abdominal pain.  Patient has been taking Ceftin since 1/28 for UTI.  She initially had dysuria, burning on urination and increased urinary frequency.  She said her symptoms have improved.  Currently no dysuria or burning with urination.  She still has urinary frequency.   Data reviewed independently and ED Course: pt was found to have WBC 6.5, GFR> 60, temperature normal, blood pressure 109/64, heart rate 73, RR 16, oxygen saturation 96% on room air.  CT of head CT of C-spine is negative for acute injury.  X-ray of right shoulder showed comminuted intra-articular fracture of the surgical neck of the right humerus. Patient is placed on MedSurg bed for patient.  Message sent to Dr. Angelina Ok of Ortho for consultation.  EKG: I have personally reviewed.  Sinus rhythm, QTc 446, LAD, poor R wave progression.   Review of Systems:   General: no fevers, chills, no body weight gain, has fatigue HEENT: no blurry vision, hearing changes or sore  throat Respiratory: no dyspnea, coughing, wheezing CV: no chest pain, no palpitations GI: no nausea, vomiting, abdominal pain, diarrhea, constipation GU: no dysuria, burning on urination, increased urinary frequency, hematuria  Ext: no leg edema Neuro: no unilateral weakness, numbness, or tingling, no vision change or hearing loss. has lightheadedness, fall Skin: no rash, no skin tear. MSK: No muscle spasm, no deformity, no limitation of range of movement in spin.  Has right shoulder pain Heme: No easy bruising.  Travel history: No recent long distant travel.   Allergy:  Allergies  Allergen Reactions   Macrobid [Nitrofurantoin] Shortness Of Breath   Codeine Other (See Comments)    Makes her disoriented   Hydrocodone Other (See Comments)    Patient states she was seeing things that weren't there and very disoriented.   Oxycodone Other (See Comments)    hallucinations   Peanut-Containing Drug Products     Other reaction(s): Headache   Peanuts [Peanut Oil] Other (See Comments)    Violent Migraines   Penicillins Nausea Only   Tramadol Other (See Comments)    Dizziness.    Past Medical History:  Diagnosis Date   Benign essential HTN 09/21/2015   Cancer (HCC)    Skin CA resected from Right eyelid and both legs.   Neuropathy    Seizures (HCC)    Thyroid disease     Past Surgical History:  Procedure Laterality Date   ABDOMINAL HYSTERECTOMY     BREAST BIOPSY Bilateral    neg   BREAST  EXCISIONAL BIOPSY Right    KNEE ARTHROSCOPY W/ LATERAL RETINACULAR REPAIR      Social History:  reports that she has never smoked. She has never used smokeless tobacco. She reports that she does not drink alcohol and does not use drugs.  Family History:  Family History  Problem Relation Age of Onset   Breast cancer Mother 40   Breast cancer Maternal Uncle 30   Breast cancer Maternal Aunt        mat great aunt   Breast cancer Maternal Aunt 23   Tracheal cancer Father    Cancer Sister     Brain cancer Sister    Cancer Brother      Prior to Admission medications   Medication Sig Start Date End Date Taking? Authorizing Provider  acetaminophen (TYLENOL) 500 MG tablet Take 1,000 mg by mouth every 6 (six) hours as needed.    [provider]  alendronate (FOSAMAX) 70 MG tablet Take 70 mg by mouth every 7 (seven) days. 01/26/21   [provider]  Apoaequorin (PREVAGEN PO) Take 75 mg by mouth in the morning, at noon, and at bedtime.    [provider]  beta carotene w/minerals (OCUVITE) tablet Take 1 tablet by mouth daily.    [provider]  cefUROXime (CEFTIN) 250 MG tablet Take 1 tablet (250 mg total) by mouth 2 (two) times daily with a meal for 5 days. 10/20/23 10/25/23  Carman Ching, PA-C  dicyclomine (BENTYL) 10 MG capsule Take 10 mg by mouth 4 (four) times daily -  before meals and at bedtime.     [provider]  donepezil (ARICEPT ODT) 10 MG disintegrating tablet Take 10 mg by mouth at bedtime. 05/15/21   [provider]  dorzolamide (TRUSOPT) 2 % ophthalmic solution Place 1 drop into both eyes 3 (three) times daily.    [provider]  DULoxetine HCl 40 MG CPEP Take 40 mg by mouth daily. 10/29/21   [provider]  estradiol (ESTRACE) 0.1 MG/GM vaginal cream Estrogen Cream Instruction Discard applicator Apply pea sized amount to tip of finger to urethra before bed. Wash hands well after application. Use Monday, Wednesday and Friday 12/12/22   Carman Ching, PA-C  fexofenadine (ALLEGRA) 180 MG tablet Take 180 mg by mouth daily.    [provider]  latanoprost (XALATAN) 0.005 % ophthalmic solution  09/19/20   [provider]  levothyroxine (SYNTHROID) 100 MCG tablet Take 100 mcg by mouth daily. 09/05/20   [provider]  lovastatin (MEVACOR) 20 MG tablet Take 20 mg by mouth daily. 01/04/20   [provider]  LUMIGAN 0.01 % SOLN Place 1 drop into both eyes at  bedtime. 12/25/20   [provider]  Melatonin 1 MG TABS Take 10 mg by mouth once.    [provider]  montelukast (SINGULAIR) 10 MG tablet Take 10 mg by mouth daily. 09/13/21   [provider]  naproxen sodium (ALEVE) 220 MG tablet Take 220 mg by mouth.    [provider]  nortriptyline (PAMELOR) 10 MG capsule Take 10 mg by mouth at bedtime. 01/16/21   [provider]  polyethylene glycol powder (GLYCOLAX/MIRALAX) 17 GM/SCOOP powder Take by mouth. 03/29/20   [provider]  predniSONE (DELTASONE) 10 MG tablet Take 1 tablet by mouth daily. 02/19/23   [provider]  pregabalin (LYRICA) 25 MG capsule Take 50 mg by mouth 2 (two) times daily. Patient to increase    [provider]  Probiotic, Lactobacillus, CAPS Take by mouth.    [provider]  simvastatin (ZOCOR) 10 MG tablet Take 10 mg by mouth at bedtime.     [provider]    Physical Exam: Vitals:   10/23/23 2005 10/23/23 2300  BP: 109/64   Pulse: 75 73  Resp: 16 10  Temp: 98.2 F (36.8 C)   TempSrc: Oral   SpO2: 98% 96%   General: Not in acute distress HEENT:       Eyes: PERRL, EOMI, no jaundice       ENT: No discharge from the ears and nose, no pharynx injection, no tonsillar enlargement.        Neck: No JVD, no bruit, no mass felt. Heme: No neck lymph node enlargement. Cardiac: S1/S2, RRR, No murmurs, No gallops or rubs. Respiratory: No rales, wheezing, rhonchi or rubs. GI: Soft, nondistended, nontender, no rebound pain, no organomegaly, BS present. GU: No hematuria Ext: No pitting leg edema bilaterally. 1+DP/PT pulse bilaterally. Musculoskeletal: has tenderness in the right shoulder Skin: No rashes.  Neuro: Alert, oriented X3, cranial nerves II-XII grossly intact, moves all extremities normally. Psych: Patient is not psychotic, no suicidal or hemocidal ideation.  Labs on Admission: I have personally reviewed following labs and imaging  studies  CBC: Recent Labs  Lab 10/23/23 2237  WBC 6.5  HGB 13.0  HCT 38.4  MCV 93.0  PLT 152   Basic Metabolic Panel: Recent Labs  Lab 10/23/23 2237  NA 142  K 3.8  CL 103  CO2 27  GLUCOSE 177*  BUN 15  CREATININE 0.91  CALCIUM 9.9   GFR: Estimated Creatinine Clearance: 42.3 mL/min (by C-G formula based on SCr of 0.91 mg/dL). Liver Function Tests: No results for input(s): "AST", "ALT", "ALKPHOS", "BILITOT", "PROT", "ALBUMIN" in the last 168 hours. No results for input(s): "LIPASE", "AMYLASE" in the last 168 hours. No results for input(s): "AMMONIA" in the last 168 hours. Coagulation Profile: No results for input(s): "INR", "PROTIME" in the last 168 hours. Cardiac Enzymes: No results for input(s): "CKTOTAL", "CKMB", "CKMBINDEX", "TROPONINI" in the last 168 hours. BNP (last 3 results) No results for input(s): "PROBNP" in the last 8760 hours. HbA1C: No results for input(s): "HGBA1C" in the last 72 hours. CBG: No results for input(s): "GLUCAP" in the last 168 hours. Lipid Profile: No results for input(s): "CHOL", "HDL", "LDLCALC", "TRIG", "CHOLHDL", "LDLDIRECT" in the last 72 hours. Thyroid Function Tests: No results for input(s): "TSH", "T4TOTAL", "FREET4", "T3FREE", "THYROIDAB" in the last 72 hours. Anemia Panel: No results for input(s): "VITAMINB12", "FOLATE", "FERRITIN", "TIBC", "IRON", "RETICCTPCT" in the last 72 hours. Urine analysis:    Component Value Date/Time   COLORURINE YELLOW 06/24/2021 1238   APPEARANCEUR Hazy (A) 10/19/2023 1320   LABSPEC 1.020 06/24/2021 1238   LABSPEC 1.012 02/15/2013 1004   PHURINE 6.5 06/24/2021 1238   GLUCOSEU Negative 10/19/2023 1320   GLUCOSEU Negative 02/15/2013 1004   HGBUR NEGATIVE 06/24/2021 1238   BILIRUBINUR Negative 10/19/2023 1320   BILIRUBINUR Negative 02/15/2013 1004   KETONESUR NEGATIVE 06/24/2021 1238   PROTEINUR Negative 10/19/2023 1320   PROTEINUR NEGATIVE 06/24/2021 1238   NITRITE Negative 10/19/2023 1320    NITRITE NEGATIVE 06/24/2021 1238   LEUKOCYTESUR 2+ (A) 10/19/2023 1320   LEUKOCYTESUR SMALL (A) 06/24/2021 1238   LEUKOCYTESUR Trace 02/15/2013 1004   Sepsis Labs: @LABRCNTIP (procalcitonin:4,lacticidven:4) ) Recent Results (from the past 240 hours)  CULTURE, URINE COMPREHENSIVE     Status: Abnormal   Collection Time: 10/19/23  1:20 PM  Specimen: Urine   UR  Result Value Ref Range Status   Urine Culture, Comprehensive Final report (A)  Final   Organism ID, Bacteria Escherichia coli (A)  Final    Comment: Cefazolin <=4 ug/mL Cefazolin with an MIC <=16 predicts susceptibility to the oral agents cefaclor, cefdinir, cefpodoxime, cefprozil, cefuroxime, cephalexin, and loracarbef when used for therapy of uncomplicated urinary tract infections due to E. coli, Klebsiella pneumoniae, and Proteus mirabilis. 25,000-50,000 colony forming units per mL    ANTIMICROBIAL SUSCEPTIBILITY Comment  Final    Comment:       ** S = Susceptible; I = Intermediate; R = Resistant **                    P = Positive; N = Negative             MICS are expressed in micrograms per mL    Antibiotic                 RSLT#1    RSLT#2    RSLT#3    RSLT#4 Amoxicillin/Clavulanic Acid    S Ampicillin                     S Cefepime                       S Ceftriaxone                    S Cefuroxime                     S Ciprofloxacin                  S Ertapenem                      S Gentamicin                     S Imipenem                       S Levofloxacin                   S Meropenem                      S Nitrofurantoin                 S Piperacillin/Tazobactam        S Tetracycline                   S Tobramycin                     S Trimethoprim/Sulfa             S   Microscopic Examination     Status: Abnormal   Collection Time: 10/19/23  1:20 PM   Urine  Result Value Ref Range Status   WBC, UA >30 (A) 0 - 5 /hpf Final   RBC, Urine 0-2 0 - 2 /hpf Final   Epithelial Cells (non renal) 0-10 0  - 10 /hpf Final   Bacteria, UA Few None seen/Few Final     Radiological Exams on Admission:   Assessment/Plan Principal Problem:   Fracture, shoulder, right, closed, initial encounter Active Problems:   Fall at home, initial encounter   Acquired hypothyroidism  Benign essential HTN   HLD (hyperlipidemia)   Chronic diastolic CHF (congestive heart failure) (HCC)   UTI (urinary tract infection)   Assessment and Plan:   Fracture, shoulder, right, closed, initial encounter: As evidenced by x-ray. No neurovascular compromise.  Not sure if pt needs surgery. Orthopedic surgeon, Dr. Angelina Ok was consulted.   - will place in MedSurg bed for observation - Pain control: prn fentanyl, Tylenol and tyleno - When necessary Zofran for nausea - Robaxin for muscle spasm - Lidoderm patch for pain -Apply right shoulder immobilizer/sling  Fall at home, initial encounter: Patient reports lightheadedness, not sure if she passed out.  Patient could have an episode of orthostatic status.  No focal neurodeficit on physical examination. - PT/OT when able to (not ordered now) -Check orthostatic vital signs -IV fluid: 500 cc normal saline -For precaution  Acquired hypothyroidism -Synthroid  Benign essential HTN: Blood pressure 109/64.  Patient is not taking medications currently -IV hydralazine as needed  HLD (hyperlipidemia) -Switch lovastatin to pravastatin in hospital  Chronic diastolic CHF (congestive heart failure) (HCC): 2D echo on 08/24/2023 showed EF of 55% with grade 1 diastolic dysfunction.  Patient does not have leg edema.  No SOB.  CHF seem to be compensated. -Check BNP  UTI (urinary tract infection): Her urologist prescribed Ceftin on 1/28.  Symptoms have been improving, but still has increased frequency. -Repeated urinalysis -Follow-up urine culture -Continue Ceftin         DVT ppx: SCD  Code Status:  DNR (I discussed with patient in the presence of her son, and  explained the meaning of CODE STATUS. Patient wants to be DNR)  Family Communication:     Yes, patient's son at bed side.      Disposition Plan:  Anticipate discharge back to previous environment  Consults called: Dr. Rosann Auerbach of Ortho  Admission status and Level of care: Med-Surg:    for obs as inpt        Dispo: The patient is from: Home              Anticipated d/c is to: Home              Anticipated d/c date is: 1 day              Patient currently is not medically stable to d/c.    Severity of Illness:  The appropriate patient status for this patient is OBSERVATION. Observation status is judged to be reasonable and necessary in order to provide the required intensity of service to ensure the patient's safety. The patient's presenting symptoms, physical exam findings, and initial radiographic and laboratory data in the context of their medical condition is felt to place them at decreased risk for further clinical deterioration. Furthermore, it is anticipated that the patient will be medically stable for discharge from the hospital within 2 midnights of admission.        Date of Service 10/24/2023    Lorretta Harp [facility]   If 7PM-7AM, please contact night-coverage [email or web address] 10/24/2023, 12:25 AM

## 2023-10-23 NOTE — ED Provider Notes (Addendum)
[facility] Provider Note    Event Date/Time   First MD Initiated Contact with Patient 10/23/23 2219     (approximate)   History   Fall   HPI  Sarah [name] is a 89 y.o. female with history of hypertension who comes in with concerns for a fall.  Patient is unclear why she fell.  She is not sure if she passed out.  She states that EMS told her blood pressure was initially low.  She denies any overt chest pain, shortness of breath.  She states that she landed on her right shoulder and has pain in there that is a 15 out of 10.   Physical Exam   Triage Vital Signs: ED Triage Vitals [10/23/23 2005]  Encounter Vitals Group     BP 109/64     Systolic BP Percentile      Diastolic BP Percentile      Pulse Rate 75     Resp 16     Temp 98.2 F (36.8 C)     Temp Source Oral     SpO2 98 %     Weight      Height      Head Circumference      Peak Flow      Pain Score 10     Pain Loc      Pain Education      Exclude from Growth Chart     Most recent vital signs: Vitals:   10/23/23 2005  BP: 109/64  Pulse: 75  Resp: 16  Temp: 98.2 F (36.8 C)  SpO2: 98%     General: Awake, no distress.  CV:  Good peripheral perfusion.  Resp:  Normal effort.  Abd:  No distention.  Other:  No abdominal tenderness able to lift both legs up off the bed.  Right shoulder tenderness.  Neurovascularly intact.   ED Results / Procedures / Treatments   Labs (all labs ordered are listed, but only abnormal results are displayed) Labs Reviewed  CBC  BASIC METABOLIC PANEL  TROPONIN I (HIGH SENSITIVITY)     EKG  My interpretation of EKG:    RADIOLOGY I have reviewed the xray personally and interpreted and positive fracture of the humerus  PROCEDURES:  Critical Care performed: No  Procedures   MEDICATIONS ORDERED IN ED: Medications  fentaNYL (SUBLIMAZE) injection 25 mcg (has no administration in time range)  acetaminophen (TYLENOL) tablet  1,000 mg (has no administration in time range)     IMPRESSION / MDM / ASSESSMENT AND PLAN / ED COURSE  I reviewed the triage vital signs and the nursing notes.   Patient's presentation is most consistent with acute presentation with potential threat to life or bodily function.   Patient comes in with a fall consider possible syncope will get EKG, blood work to further evaluate.  Pain is going to be very difficult to control over her right shoulder pack fracture at home given she has been nontolerant of oxycodone and multiple medications previously causing hallucinations.  I think it would be best to admit patient for pain control here and make sure we have a good plan before patient going home.  Will place patient in a sling I did message orthopedics so they are aware.  CT imaging without evidence of intercranial hemorrhage, cervical fracture.  Patient will be handed off pending labs, admission  The patient is on the cardiac monitor to evaluate for evidence of arrhythmia and/or significant heart rate  changes.      FINAL CLINICAL IMPRESSION(S) / ED DIAGNOSES   Final diagnoses:  Fall, initial encounter  Closed fracture of head of right humerus, initial encounter     Rx / DC Orders   ED Discharge Orders     None        Note:  This document was prepared using Dragon voice recognition software and may include unintentional dictation errors.   Concha Se, MD 10/23/23 2242    Concha Se, MD 10/23/23 [phone]

## 2023-10-24 DIAGNOSIS — Y92009 Unspecified place in unspecified non-institutional (private) residence as the place of occurrence of the external cause: Secondary | ICD-10-CM | POA: Diagnosis not present

## 2023-10-24 DIAGNOSIS — W19XXXA Unspecified fall, initial encounter: Secondary | ICD-10-CM | POA: Diagnosis not present

## 2023-10-24 DIAGNOSIS — I5032 Chronic diastolic (congestive) heart failure: Secondary | ICD-10-CM

## 2023-10-24 DIAGNOSIS — Z66 Do not resuscitate: Secondary | ICD-10-CM | POA: Diagnosis not present

## 2023-10-24 DIAGNOSIS — I10 Essential (primary) hypertension: Secondary | ICD-10-CM | POA: Diagnosis not present

## 2023-10-24 DIAGNOSIS — S4291XA Fracture of right shoulder girdle, part unspecified, initial encounter for closed fracture: Secondary | ICD-10-CM | POA: Diagnosis not present

## 2023-10-24 LAB — CBC
HCT: 32.7 % — ABNORMAL LOW (ref 36.0–46.0)
Hemoglobin: 11.2 g/dL — ABNORMAL LOW (ref 12.0–15.0)
MCH: 31.6 pg (ref 26.0–34.0)
MCHC: 34.3 g/dL (ref 30.0–36.0)
MCV: 92.4 fL (ref 80.0–100.0)
Platelets: 143 10*3/uL — ABNORMAL LOW (ref 150–400)
RBC: 3.54 MIL/uL — ABNORMAL LOW (ref 3.87–5.11)
RDW: 13 % (ref 11.5–15.5)
WBC: 5.6 10*3/uL (ref 4.0–10.5)
nRBC: 0 % (ref 0.0–0.2)

## 2023-10-24 LAB — BASIC METABOLIC PANEL
Anion gap: 8 (ref 5–15)
BUN: 15 mg/dL (ref 8–23)
CO2: 27 mmol/L (ref 22–32)
Calcium: 9.1 mg/dL (ref 8.9–10.3)
Chloride: 105 mmol/L (ref 98–111)
Creatinine, Ser: 0.73 mg/dL (ref 0.44–1.00)
GFR, Estimated: >60 mL/min (ref >60)
Glucose, Bld: 142 mg/dL — ABNORMAL HIGH (ref 70–99)
Potassium: 3.8 mmol/L (ref 3.5–5.1)
Sodium: 140 mmol/L (ref 135–145)

## 2023-10-24 LAB — TYPE AND SCREEN
ABO/RH(D): A POS
Antibody Screen: NEGATIVE

## 2023-10-24 LAB — TROPONIN I (HIGH SENSITIVITY): Troponin I (High Sensitivity): 4 ng/L (ref <18)

## 2023-10-24 LAB — PROTIME-INR
INR: 1.1 (ref 0.8–1.2)
Prothrombin Time: 14 s (ref 11.4–15.2)

## 2023-10-24 LAB — BRAIN NATRIURETIC PEPTIDE: B Natriuretic Peptide: 35.5 pg/mL (ref 0.0–100.0)

## 2023-10-24 LAB — APTT: aPTT: 30 s (ref 24–36)

## 2023-10-24 MED ORDER — PRAVASTATIN SODIUM 20 MG PO TABS: 20.0000 mg | ORAL_TABLET | Freq: Every day | ORAL | Status: DC

## 2023-10-24 MED ORDER — DORZOLAMIDE HCL 2 % OP SOLN
1.0000 [drp] | Freq: Three times a day (TID) | OPHTHALMIC | Status: DC
  Administered 2023-10-24: 1 [drp] via OPHTHALMIC
  Filled 2023-10-24: qty 10

## 2023-10-24 MED ORDER — METHOCARBAMOL 500 MG PO TABS: 500.0000 mg | ORAL_TABLET | Freq: Three times a day (TID) | ORAL | 0 refills | Status: AC | PRN

## 2023-10-24 MED ORDER — DULOXETINE HCL 20 MG PO CPEP
40.0000 mg | ORAL_CAPSULE | Freq: Every day | ORAL | Status: DC
Start: 2023-10-24 — End: 2023-10-24
  Administered 2023-10-24: 40 mg via ORAL
  Filled 2023-10-24: qty 2

## 2023-10-24 MED ORDER — HYDROMORPHONE HCL 2 MG PO TABS: 1.0000 mg | ORAL_TABLET | ORAL | 0 refills | Status: AC | PRN

## 2023-10-24 MED ORDER — HYDROMORPHONE HCL 1 MG/ML IJ SOLN
0.5000 mg | Freq: Once | INTRAMUSCULAR | Status: AC
  Administered 2023-10-24: 0.5 mg via INTRAVENOUS
  Filled 2023-10-24: qty 0.5

## 2023-10-24 MED ORDER — HYDROMORPHONE HCL 2 MG PO TABS
1.0000 mg | ORAL_TABLET | Freq: Once | ORAL | Status: AC
  Administered 2023-10-24: 1 mg via ORAL
  Filled 2023-10-24: qty 1

## 2023-10-24 MED ORDER — PREGABALIN 50 MG PO CAPS
50.0000 mg | ORAL_CAPSULE | Freq: Two times a day (BID) | ORAL | Status: DC
Start: 2023-10-24 — End: 2023-10-24
  Administered 2023-10-24: 50 mg via ORAL
  Filled 2023-10-24: qty 1

## 2023-10-24 MED ORDER — CEFUROXIME AXETIL 250 MG PO TABS
250.0000 mg | ORAL_TABLET | Freq: Two times a day (BID) | ORAL | Status: DC
  Administered 2023-10-24: 250 mg via ORAL
  Filled 2023-10-24 (×2): qty 1

## 2023-10-24 MED ORDER — LEVOTHYROXINE SODIUM 100 MCG PO TABS
100.0000 ug | ORAL_TABLET | Freq: Every day | ORAL | Status: DC
  Administered 2023-10-24: 100 ug via ORAL
  Filled 2023-10-24: qty 1

## 2023-10-24 MED ORDER — ONDANSETRON HCL 4 MG PO TABS: 4.0000 mg | ORAL_TABLET | Freq: Four times a day (QID) | ORAL | 0 refills | Status: AC | PRN

## 2023-10-24 MED ORDER — SODIUM CHLORIDE 0.9 % IV BOLUS
500.0000 mL | Freq: Once | INTRAVENOUS | Status: AC
  Administered 2023-10-24: 500 mL via INTRAVENOUS

## 2023-10-24 NOTE — Hospital Course (Signed)
HPI: Sarah [name] is a 89 y.o. female with medical history significant of HTN, HLD, dCHF, hypothyroidism, chronic pain, IBS, skin cancer, who presents with fall, right shoulder pain.   Patient states that at about 530, she got up from a chair, going to the bathroom, felt lightheaded and fell, injured her right shoulder, causing pain in the right shoulder.  She is not very sure if she passed out.  Denies unilateral numbness or tingling in extremities.  No facial droop or slurred speech.  No head or neck injury.  Her pain in right shoulder is constant, sharp, severe, nonradiating, aggravated by movement.   Patient does not have chest pain, cough, SOB.  No fever or chills.  No nausea, vomiting, diarrhea or abdominal pain.  Patient has been taking Ceftin since 1/28 for UTI.  She initially had dysuria, burning on urination and increased urinary frequency.  She said her symptoms have improved.  Currently no dysuria or burning with urination.  She still has urinary frequency.  Significant Events: Admitted 10/23/2023 for right shoulder fracture   Significant Labs: WBC 6.5, HgB 13, plt 152 Na 142, K 3.8, BUN 15, Scr 0.91, Glu 177 BNP 35  Significant Imaging Studies: X-ray of right shoulder showed comminuted intra-articular fracture of the surgical neck of the right humerus   Antibiotic Therapy: Anti-infectives (From admission, onward)    Start     Dose/Rate Route Frequency Ordered Stop   10/24/23 0800  cefUROXime (CEFTIN) tablet 250 mg        250 mg Oral 2 times daily with meals 10/24/23 0025         Procedures:   Consultants: Ortho

## 2023-10-24 NOTE — Assessment & Plan Note (Signed)
10-24-2023 stable.

## 2023-10-24 NOTE — ED Notes (Signed)
This RN messaged Carollee Herter, DO to notify him of pt c/o dizziness, lightheadedness and nausea when this RN was attempted to d/c pt. Pt broke out into a sweat and stated she felt as though she might faint while sitting on the edge of the bed. Pt assisted to lying position.  Pt had been assisted to a standing position several times immediately prior to episode without complaint. MD instructed this RN to obtain orthostatic vital signs and provide pt with food and po liquid, as she had been NPO since last night.

## 2023-10-24 NOTE — Progress Notes (Signed)
PROGRESS NOTE    Sarah [name]  WUJ:[id number] DOB: 01/02/1932 DOA: 10/23/2023 PCP: Rayetta Humphrey, MD  Subjective: Pt seen and examined. Met with pt's son at bedside Discussed with ortho Dr. Rosann Auerbach. Family prefers to see ortho at [facility] about right shoulder fracture.  No operative intervention at this time.  Discussed with ortho. Ok for DC to home.   Hospital Course: HPI: Sarah [name] is a 89 y.o. female with medical history significant of HTN, HLD, dCHF, hypothyroidism, chronic pain, IBS, skin cancer, who presents with fall, right shoulder pain.   Patient states that at about 530, she got up from a chair, going to the bathroom, felt lightheaded and fell, injured her right shoulder, causing pain in the right shoulder.  She is not very sure if she passed out.  Denies unilateral numbness or tingling in extremities.  No facial droop or slurred speech.  No head or neck injury.  Her pain in right shoulder is constant, sharp, severe, nonradiating, aggravated by movement.   Patient does not have chest pain, cough, SOB.  No fever or chills.  No nausea, vomiting, diarrhea or abdominal pain.  Patient has been taking Ceftin since 1/28 for UTI.  She initially had dysuria, burning on urination and increased urinary frequency.  She said her symptoms have improved.  Currently no dysuria or burning with urination.  She still has urinary frequency.  Significant Events: Admitted 10/23/2023 for right shoulder fracture   Significant Labs: WBC 6.5, HgB 13, plt 152 Na 142, K 3.8, BUN 15, Scr 0.91, Glu 177 BNP 35  Significant Imaging Studies: X-ray of right shoulder showed comminuted intra-articular fracture of the surgical neck of the right humerus   Antibiotic Therapy: Anti-infectives (From admission, onward)    Start     Dose/Rate Route Frequency Ordered Stop   10/24/23 0800  cefUROXime (CEFTIN) tablet 250 mg        250 mg Oral 2 times daily with meals 10/24/23 0025          Procedures:   Consultants: Ortho    Assessment and Plan: * Fracture, shoulder, right, closed, initial encounter 10-24-2023 pt has been seen by Dr. Rosann Auerbach with orthopedics. No operative intervention at this point. Family prefers to see [facility] in [place]. Pt will continue with right arm sling until seen. Prn oral dilaudid 1-2 mg q4h. Prn zofran. Daily stool softeners. Prn laxative. Pt should ice her right shoulder to help with pain control. Tylenol 1000 mg q6h prn.  Fall at home, initial encounter 10-24-2023 mechanical fall. No other injury except right humerus fracture.  HLD (hyperlipidemia) 10-24-2023 stable.  Benign essential HTN 10-24-2023 stable.  Acquired hypothyroidism 10-24-2023 stable.  Chronic diastolic CHF (congestive heart failure) (HCC) 10-24-2023 stable.  DNR (do not resuscitate)/DNI(Do Not Intubate) Stable.       DVT prophylaxis: SCDs Start: 10/23/23 2350    Code Status: Limited: Do not attempt resuscitation (DNR) -DNR-LIMITED -Do Not Intubate/DNI  Family Communication: discussed with pt and son at bedside Disposition Plan: return home Reason for continuing need for hospitalization: stable for DC  Objective: Vitals:   10/24/23 0446 10/24/23 0500 10/24/23 0600 10/24/23 0918  BP:  (!) 155/76 (!) 153/89 (!) 169/78  Pulse:  66 63 71  Resp:  13 12 14   Temp: 97.9 F (36.6 C)   98.4 F (36.9 C)  TempSrc: Oral   Oral  SpO2:  96% 97% 97%    Intake/Output Summary (Last 24 hours) at 10/24/2023 1031 Last data filed  at 10/24/2023 0119 Gross per 24 hour  Intake 500.14 ml  Output --  Net 500.14 ml   There were no vitals filed for this visit.  Examination:  Physical Exam Vitals and nursing note reviewed.  HENT:     Head: Normocephalic and atraumatic.     Nose: Nose normal.  Cardiovascular:     Rate and Rhythm: Normal rate and regular rhythm.  Pulmonary:     Effort: Pulmonary effort is normal.  Abdominal:     General: Bowel sounds  are normal. There is no distension.     Palpations: Abdomen is soft.  Musculoskeletal:     Comments: Right arm in sling +bruising over right anterior deltoid  Skin:    Capillary Refill: Capillary refill takes less than 2 seconds.  Neurological:     General: No focal deficit present.     Mental Status: She is alert and oriented to person, place, and time.     Comments: Able to wiggle her right fingers. Normal sensation right hand.     Data Reviewed: I have personally reviewed following labs and imaging studies  CBC: Recent Labs  Lab 10/23/23 2237 10/24/23 0608  WBC 6.5 5.6  HGB 13.0 11.2*  HCT 38.4 32.7*  MCV 93.0 92.4  PLT 152 143*   Basic Metabolic Panel: Recent Labs  Lab 10/23/23 2237 10/24/23 0608  NA 142 140  K 3.8 3.8  CL 103 105  CO2 27 27  GLUCOSE 177* 142*  BUN 15 15  CREATININE 0.91 0.73  CALCIUM 9.9 9.1   GFR: Estimated Creatinine Clearance: 48.1 mL/min (by C-G formula based on SCr of 0.73 mg/dL). Coagulation Profile: Recent Labs  Lab 10/24/23 0029  INR 1.1   BNP (last 3 results) Recent Labs    10/23/23 2236  BNP 35.5    Recent Results (from the past 240 hours)  CULTURE, URINE COMPREHENSIVE     Status: Abnormal   Collection Time: 10/19/23  1:20 PM   Specimen: Urine   UR  Result Value Ref Range Status   Urine Culture, Comprehensive Final report (A)  Final   Organism ID, Bacteria Escherichia coli (A)  Final    Comment: Cefazolin <=4 ug/mL Cefazolin with an MIC <=16 predicts susceptibility to the oral agents cefaclor, cefdinir, cefpodoxime, cefprozil, cefuroxime, cephalexin, and loracarbef when used for therapy of uncomplicated urinary tract infections due to E. coli, Klebsiella pneumoniae, and Proteus mirabilis. 25,000-50,000 colony forming units per mL    ANTIMICROBIAL SUSCEPTIBILITY Comment  Final    Comment:       ** S = Susceptible; I = Intermediate; R = Resistant **                    P = Positive; N = Negative             MICS  are expressed in micrograms per mL    Antibiotic                 RSLT#1    RSLT#2    RSLT#3    RSLT#4 Amoxicillin/Clavulanic Acid    S Ampicillin                     S Cefepime                       S Ceftriaxone  S Cefuroxime                     S Ciprofloxacin                  S Ertapenem                      S Gentamicin                     S Imipenem                       S Levofloxacin                   S Meropenem                      S Nitrofurantoin                 S Piperacillin/Tazobactam        S Tetracycline                   S Tobramycin                     S Trimethoprim/Sulfa             S   Microscopic Examination     Status: Abnormal   Collection Time: 10/19/23  1:20 PM   Urine  Result Value Ref Range Status   WBC, UA >30 (A) 0 - 5 /hpf Final   RBC, Urine 0-2 0 - 2 /hpf Final   Epithelial Cells (non renal) 0-10 0 - 10 /hpf Final   Bacteria, UA Few None seen/Few Final     Radiology Studies: DG Shoulder Right Result Date: 10/23/2023 CLINICAL DATA:  Right arm pain after fall EXAM: RIGHT SHOULDER - 2+ VIEW COMPARISON:  None Available. FINDINGS: Comminuted intra-articular fracture of the surgical neck of the right humerus extending into the greater tuberosity. No dislocation. Advanced arthritis of the [facility] joint. IMPRESSION: Comminuted intra-articular fracture of the surgical neck of the right humerus. Electronically Signed   By: Minerva Fester M.D.   On: 10/23/2023 21:10   CT Head Wo Contrast Result Date: 10/23/2023 CLINICAL DATA:  Head trauma, minor (Age >= 65y); Neck trauma (Age >= 65y) EXAM: CT HEAD WITHOUT CONTRAST CT CERVICAL SPINE WITHOUT CONTRAST TECHNIQUE: Multidetector CT imaging of the head and cervical spine was performed following the standard protocol without intravenous contrast. Multiplanar CT image reconstructions of the cervical spine were also generated. RADIATION DOSE REDUCTION: This exam was performed according to the departmental  dose-optimization program which includes automated exposure control, adjustment of the mA and/or kV according to patient size and/or use of iterative reconstruction technique. COMPARISON:  None Available. FINDINGS: CT HEAD FINDINGS Brain: No evidence of acute infarction, hemorrhage, hydrocephalus, extra-axial collection or mass lesion/mass effect. Vascular: Calcific atherosclerosis. Skull: No acute fracture. Sinuses/Orbits: Clear sinuses.  No acute orbital findings. Other: No mastoid effusions. CT CERVICAL SPINE FINDINGS Alignment: No substantial sagittal subluxation. Skull base and vertebrae: No acute fracture. Soft tissues and spinal canal: No prevertebral fluid or swelling. No visible canal hematoma. Disc levels:  Moderate multilevel degenerative change. Upper chest: Visualized lung apices are clear IMPRESSION: No evidence of acute abnormality intracranially or in the cervical spine. Electronically Signed   By: Feliberto Harts M.D.   On: 10/23/2023 21:03   CT Cervical Spine  Wo Contrast Result Date: 10/23/2023 CLINICAL DATA:  Head trauma, minor (Age >= 65y); Neck trauma (Age >= 65y) EXAM: CT HEAD WITHOUT CONTRAST CT CERVICAL SPINE WITHOUT CONTRAST TECHNIQUE: Multidetector CT imaging of the head and cervical spine was performed following the standard protocol without intravenous contrast. Multiplanar CT image reconstructions of the cervical spine were also generated. RADIATION DOSE REDUCTION: This exam was performed according to the departmental dose-optimization program which includes automated exposure control, adjustment of the mA and/or kV according to patient size and/or use of iterative reconstruction technique. COMPARISON:  None Available. FINDINGS: CT HEAD FINDINGS Brain: No evidence of acute infarction, hemorrhage, hydrocephalus, extra-axial collection or mass lesion/mass effect. Vascular: Calcific atherosclerosis. Skull: No acute fracture. Sinuses/Orbits: Clear sinuses.  No acute orbital findings.  Other: No mastoid effusions. CT CERVICAL SPINE FINDINGS Alignment: No substantial sagittal subluxation. Skull base and vertebrae: No acute fracture. Soft tissues and spinal canal: No prevertebral fluid or swelling. No visible canal hematoma. Disc levels:  Moderate multilevel degenerative change. Upper chest: Visualized lung apices are clear IMPRESSION: No evidence of acute abnormality intracranially or in the cervical spine. Electronically Signed   By: Feliberto Harts M.D.   On: 10/23/2023 21:03    Scheduled Meds:  cefUROXime  250 mg Oral BID WC   dorzolamide  1 drop Both Eyes TID   DULoxetine  40 mg Oral Daily   HYDROmorphone  1 mg Oral Once   levothyroxine  100 mcg Oral Q0600   lidocaine  1 patch Transdermal Q24H   pravastatin  20 mg Oral q1800   pregabalin  50 mg Oral BID   Continuous Infusions:   LOS: 0 days   Time spent: 45 minutes  Carollee Herter, DO  [facility]  10/24/2023, 10:31 AM

## 2023-10-24 NOTE — Discharge Instructions (Signed)
1. Follow up with [facility]. Call office [phone] for appointment. 2. Ice right shoulder every 1-2 hours to provide local pain relief. 3. Keep right arm in sling until seen by orthopedics

## 2023-10-24 NOTE — ED Notes (Signed)
Pt refused lunch tray. Provided a boxed lunch & ginger ale instead

## 2023-10-24 NOTE — Consult Note (Signed)
ORTHOPEDIC CONSULTATION  Sarah [name] [id number]  10/24/2023  CC:  Chief Complaint  Patient presents with   Fall    History of Present IlIness: The patient is a 89 y.o. female who suffered a ground-level fall landing on her right shoulder.  Orthopedics was consulted for a right proximal humeral fracture.  Admission history for completeness: Sarah [name] is a 89 y.o. female with medical history significant of HTN, HLD, dCHF, hypothyroidism, chronic pain, IBS, skin cancer, who presents with fall, right shoulder pain.   Patient states that at about 530, she got up from a chair, going to the bathroom, felt lightheaded and fell, injured her right shoulder, causing pain in the right shoulder.  She is not very sure if she passed out.  Denies unilateral numbness or tingling in extremities.  No facial droop or slurred speech.  No head or neck injury.  Her pain in right shoulder is constant, sharp, severe, nonradiating, aggravated by movement.  PMH:  Past Medical History:  Diagnosis Date   Benign essential HTN 09/21/2015   Cancer (HCC)    Skin CA resected from Right eyelid and both legs.   Neuropathy    Seizures (HCC)    Thyroid disease     SH:  Past Surgical History:  Procedure Laterality Date   ABDOMINAL HYSTERECTOMY     BREAST BIOPSY Bilateral    neg   BREAST EXCISIONAL BIOPSY Right    KNEE ARTHROSCOPY W/ LATERAL RETINACULAR REPAIR      ALL:  Allergies  Allergen Reactions   Macrobid [Nitrofurantoin] Shortness Of Breath   Codeine Other (See Comments)    Makes her disoriented   Hydrocodone Other (See Comments)    Patient states she was seeing things that weren't there and very disoriented.   Oxycodone Other (See Comments)    hallucinations   Peanut-Containing Drug Products     Other reaction(s): Headache   Peanuts [Peanut Oil] Other (See Comments)    Violent Migraines   Penicillins Nausea Only   Tramadol Other (See Comments)    Dizziness.    MED:  (Not in a hospital admission)   All home medications have been reviewed as documented in the medication reconciliation portion of the patient record.  FH:  Family History  Problem Relation Age of Onset   Breast cancer Mother 47   Breast cancer Maternal Uncle 52   Breast cancer Maternal Aunt        mat great aunt   Breast cancer Maternal Aunt 2   Tracheal cancer Father    Cancer Sister    Brain cancer Sister    Cancer Brother     Social:  reports that she has never smoked. She has never used smokeless tobacco. She reports that she does not drink alcohol and does not use drugs.  Review of Systems: General: Denies fever, chills, weight loss Eyes: Denies blurry vision, changes in vision ENT: Denies sore throat, congestions, nosebleeds CV: Denies chest pain, palpitations Respiratory: Denies shortness of breath, wheezing, cough Gl: Denies abdominal pain, nausea, vomiting GU: Denies hematuria Integumentary: Denies rashes or lesions Neuro: Denies headache, dizziness Psych: Negative Hem/Onc: Denies easy bruising or bleeding disorders Musculoskeletal: See HPI above.  Vitals: BP (!) 169/78 (BP Location: Left Arm)   Pulse 71   Temp 98.4 F (36.9 C) (Oral)   Resp 14   SpO2 97%    Physical Exam: General: Awake, alert and oriented, no acute distress. Eyes: Pupils reactive, EOMI, normal conjunctiva, no scleral icterus. HENT:  Normocephalic, atraumatic, normal hearing, moist oral mucosa Neck: Supple, non-tender, no cervical lymphadenopathy. Lungs: Chest rise is symmetric, non-labored respiration, chest wall nontender to palpation Heart: Normal rate by palpation, normal peripheral perfusion Abdomen: Soft, non-tender, non-distended. Pelvis is stable. Skin: Skin envelope intact, dry and pink, no rashes or lesions, no signs of infection. Neurologic: Awake, alert, and oriented X3 Psychiatric: Cooperative, appropriate mood and affect.  Musculoskeletal: Evaluation of the patient's  right shoulder reveals moderate swelling and ecchymosis. Range of motion is greatly reduced secondary to pain. Motor strength is decreased from normal secondary to pain. Sensation is intact to the axillary, radial, ulnar, and median nerves distally. The elbow, forearm, wrist and hand are nontender to palpation.   Radiographic findings: Multiple views of the patient's right shoulder reveal a comminuted proximal humeral fracture with the glenohumeral joint reduced.  It would appear to be a four-part fracture.  Significant downsloping is noted of the acromion and significant AC arthropathy would lead to impingement and chronic rotator cuff pathology.   Labs:  Recent Labs    10/23/23 2237 10/24/23 0608  HGB 13.0 11.2*   Recent Labs    10/23/23 2237 10/24/23 0608  WBC 6.5 5.6  RBC 4.13 3.54*  HCT 38.4 32.7*  PLT 152 143*   Recent Labs    10/23/23 2237 10/24/23 0608  NA 142 140  K 3.8 3.8  CL 103 105  CO2 27 27  BUN 15 15  CREATININE 0.91 0.73  GLUCOSE 177* 142*  CALCIUM 9.9 9.1   Recent Labs    10/24/23 0029  INR 1.1     Assessment/Plan:    Assessment: 89 year old right-hand-dominant female with closed neurovascularly intact comminuted right proximal humeral fracture with probable chronic rotator cuff pathology.   Plan:  I discussed with the patient and her family members who are present that from the orthopedic standpoint she can be discharged in her arm sling for comfort.  The patient sees an orthopedic doctor at [facility] and can follow-up this week.  We discussed the operative and nonoperative treatment options based on her age, activity level, and overall bone quality.  The patient and her family are agreeable with this conservative treatment plan for her closed neurovascularly intact right comminuted closed right proximal humeral fracture.  Will be available if needed, otherwise we will sign off.  Cecil Cranker M.D. 10/24/2023 10:21 AM

## 2023-10-24 NOTE — Assessment & Plan Note (Signed)
10-24-2023 mechanical fall. No other injury except right humerus fracture.

## 2023-10-24 NOTE — Assessment & Plan Note (Signed)
10-24-2023 pt has been seen by Dr. Rosann Auerbach with orthopedics. No operative intervention at this point. Family prefers to see [facility] in [place]. Pt will continue with right arm sling until seen. Prn oral dilaudid 1-2 mg q4h. Prn zofran. Daily stool softeners. Prn laxative. Pt should ice her right shoulder to help with pain control. Tylenol 1000 mg q6h prn.

## 2023-10-24 NOTE — Subjective & Objective (Signed)
Pt seen and examined. Met with pt's son at bedside Discussed with ortho Dr. Rosann Auerbach. Family prefers to see ortho at [facility] about right shoulder fracture.  No operative intervention at this time.  Discussed with ortho. Ok for DC to home.

## 2023-10-24 NOTE — Assessment & Plan Note (Signed)
 Stable

## 2023-10-24 NOTE — Discharge Summary (Signed)
[facility] Physician Discharge Summary   Patient name: Sarah [name]  Admit date:     10/23/2023  Discharge date: 10/24/2023  Attending Physician: Lorretta Harp [[id number]]  Discharge Physician: Carollee Herter   PCP: Rayetta Humphrey, MD  Admitted From: Home  Disposition:  Home  Recommendations for Outpatient Follow-up:  Follow up with PCP in 1-2 weeks Follow up with Raechel Chute of [facility] next week  Home Health:No Equipment/Devices: None  Discharge Condition:Stable CODE STATUS:DNR/DNI Diet recommendation: Regular Fluid Restriction: None  Hospital Summary: HPI: Sarah [name] is a 89 y.o. female with medical history significant of HTN, HLD, dCHF, hypothyroidism, chronic pain, IBS, skin cancer, who presents with fall, right shoulder pain.   Patient states that at about 530, she got up from a chair, going to the bathroom, felt lightheaded and fell, injured her right shoulder, causing pain in the right shoulder.  She is not very sure if she passed out.  Denies unilateral numbness or tingling in extremities.  No facial droop or slurred speech.  No head or neck injury.  Her pain in right shoulder is constant, sharp, severe, nonradiating, aggravated by movement.   Patient does not have chest pain, cough, SOB.  No fever or chills.  No nausea, vomiting, diarrhea or abdominal pain.  Patient has been taking Ceftin since 1/28 for UTI.  She initially had dysuria, burning on urination and increased urinary frequency.  She said her symptoms have improved.  Currently no dysuria or burning with urination.  She still has urinary frequency.  Significant Events: Admitted 10/23/2023 for right shoulder fracture   Significant Labs: WBC 6.5, HgB 13, plt 152 Na 142, K 3.8, BUN 15, Scr 0.91, Glu 177 BNP 35  Significant Imaging Studies: X-ray of right shoulder showed comminuted intra-articular fracture of the surgical neck of the right humerus   Antibiotic Therapy: Anti-infectives  (From admission, onward)    Start     Dose/Rate Route Frequency Ordered Stop   10/24/23 0800  cefUROXime (CEFTIN) tablet 250 mg        250 mg Oral 2 times daily with meals 10/24/23 0025         Procedures:   Consultants: [facility] Course by Problem: * Fracture, shoulder, right, closed, initial encounter 10-24-2023 pt has been seen by Dr. Rosann Auerbach with orthopedics. No operative intervention at this point. Family prefers to see [facility] in [place]. Pt will continue with right arm sling until seen. Prn oral dilaudid 1-2 mg q4h. Prn zofran. Daily stool softeners. Prn laxative. Pt should ice her right shoulder to help with pain control. Tylenol 1000 mg q6h prn.  Fall at home, initial encounter 10-24-2023 mechanical fall. No other injury except right humerus fracture.  HLD (hyperlipidemia) 10-24-2023 stable.  Benign essential HTN 10-24-2023 stable.  Acquired hypothyroidism 10-24-2023 stable.  Chronic diastolic CHF (congestive heart failure) (HCC) 10-24-2023 stable.  DNR (do not resuscitate)/DNI(Do Not Intubate) Stable.    Discharge Diagnoses:  Principal Problem:   Fracture, shoulder, right, closed, initial encounter Active Problems:   Fall at home, initial encounter   Acquired hypothyroidism   Benign essential HTN   HLD (hyperlipidemia)   Chronic diastolic CHF (congestive heart failure) (HCC)   DNR (do not resuscitate)/DNI(Do Not Intubate)   Discharge Instructions  Discharge Instructions     Call MD for:  difficulty breathing, headache or visual disturbances   Complete by: As directed    Call MD for:  extreme fatigue   Complete by: As directed  Call MD for:  persistant dizziness or light-headedness   Complete by: As directed    Call MD for:  persistant nausea and vomiting   Complete by: As directed    Call MD for:  redness, tenderness, or signs of infection (pain, swelling, redness, odor or green/yellow discharge around incision site)   Complete by:  As directed    Call MD for:  severe uncontrolled pain   Complete by: As directed    Call MD for:  temperature >100.4   Complete by: As directed    Diet general   Complete by: As directed    Discharge instructions   Complete by: As directed    1. Follow up with [facility]. Call office [phone] for appointment. 2. Ice right shoulder every 1-2 hours to provide local pain relief. 3. Keep right arm in sling until seen by orthopedics   Increase activity slowly   Complete by: As directed    Non weight bearing   Complete by: As directed    Laterality: right   Extremity: Upper      Allergies as of 10/24/2023       Reactions   Macrobid [nitrofurantoin] Shortness Of Breath   Codeine Other (See Comments)   Makes her disoriented   Hydrocodone Other (See Comments)   Patient states she was seeing things that weren't there and very disoriented.   Oxycodone Other (See Comments)   hallucinations   Peanut-containing Drug Products    Other reaction(s): Headache   Peanuts [peanut Oil] Other (See Comments)   Violent Migraines   Penicillins Nausea Only   Tramadol Other (See Comments)   Dizziness.        Medication List     TAKE these medications    acetaminophen 500 MG tablet Commonly known as: TYLENOL Take 1,000 mg by mouth every 6 (six) hours as needed.   alendronate 70 MG tablet Commonly known as: FOSAMAX Take 70 mg by mouth every 7 (seven) days.   beta carotene w/minerals tablet Take 1 tablet by mouth daily.   cefUROXime 250 MG tablet Commonly known as: CEFTIN Take 1 tablet (250 mg total) by mouth 2 (two) times daily with a meal for 5 days.   dicyclomine 10 MG capsule Commonly known as: BENTYL Take 10 mg by mouth 4 (four) times daily -  before meals and at bedtime.   donepezil 10 MG disintegrating tablet Commonly known as: ARICEPT ODT Take 10 mg by mouth at bedtime.   dorzolamide 2 % ophthalmic solution Commonly known as: TRUSOPT Place 1 drop into  both eyes 3 (three) times daily.   DULoxetine HCl 40 MG Cpep Take 40 mg by mouth daily.   estradiol 0.1 MG/GM vaginal cream Commonly known as: ESTRACE Estrogen Cream Instruction Discard applicator Apply pea sized amount to tip of finger to urethra before bed. Wash hands well after application. Use Monday, Wednesday and Friday   fexofenadine 180 MG tablet Commonly known as: ALLEGRA Take 180 mg by mouth daily.   HYDROmorphone 2 MG tablet Commonly known as: DILAUDID Take 0.5-1 tablets (1-2 mg total) by mouth every 4 (four) hours as needed for up to 5 days for severe pain (pain score 7-10) or moderate pain (pain score 4-6).   latanoprost 0.005 % ophthalmic solution Commonly known as: XALATAN   levothyroxine 100 MCG tablet Commonly known as: SYNTHROID Take 100 mcg by mouth daily.   lovastatin 20 MG tablet Commonly known as: MEVACOR Take 20 mg by mouth daily.  Lumigan 0.01 % Soln Generic drug: bimatoprost Place 1 drop into both eyes at bedtime.   melatonin 1 MG Tabs tablet Take 10 mg by mouth once.   methocarbamol 500 MG tablet Commonly known as: ROBAXIN Take 1 tablet (500 mg total) by mouth every 8 (eight) hours as needed for muscle spasms.   montelukast 10 MG tablet Commonly known as: SINGULAIR Take 10 mg by mouth daily.   naproxen sodium 220 MG tablet Commonly known as: ALEVE Take 220 mg by mouth.   nortriptyline 10 MG capsule Commonly known as: PAMELOR Take 10 mg by mouth at bedtime.   ondansetron 4 MG tablet Commonly known as: Zofran Take 1 tablet (4 mg total) by mouth every 6 (six) hours as needed for nausea or vomiting.   polyethylene glycol powder 17 GM/SCOOP powder Commonly known as: GLYCOLAX/MIRALAX Take by mouth.   predniSONE 10 MG tablet Commonly known as: DELTASONE Take 1 tablet by mouth daily.   pregabalin 25 MG capsule Commonly known as: LYRICA Take 50 mg by mouth 2 (two) times daily. Patient to increase   PREVAGEN PO Take 75 mg by mouth  in the morning, at noon, and at bedtime.   Probiotic (Lactobacillus) Caps Take by mouth.   simvastatin 10 MG tablet Commonly known as: ZOCOR Take 10 mg by mouth at bedtime.               Discharge Care Instructions  (From admission, onward)           Start     Ordered   10/24/23 0000  Non weight bearing       Question Answer Comment  Laterality right   Extremity Upper      10/24/23 1039            Allergies  Allergen Reactions   Macrobid [Nitrofurantoin] Shortness Of Breath   Codeine Other (See Comments)    Makes her disoriented   Hydrocodone Other (See Comments)    Patient states she was seeing things that weren't there and very disoriented.   Oxycodone Other (See Comments)    hallucinations   Peanut-Containing Drug Products     Other reaction(s): Headache   Peanuts [Peanut Oil] Other (See Comments)    Violent Migraines   Penicillins Nausea Only   Tramadol Other (See Comments)    Dizziness.    Discharge Exam: Vitals:   10/24/23 0600 10/24/23 0918  BP: (!) 153/89 (!) 169/78  Pulse: 63 71  Resp: 12 14  Temp:  98.4 F (36.9 C)  SpO2: 97% 97%    Physical Exam Vitals and nursing note reviewed.  HENT:     Head: Normocephalic and atraumatic.     Nose: Nose normal.  Cardiovascular:     Rate and Rhythm: Normal rate and regular rhythm.  Pulmonary:     Effort: Pulmonary effort is normal.  Abdominal:     General: Bowel sounds are normal. There is no distension.     Palpations: Abdomen is soft.  Musculoskeletal:     Comments: Right arm in sling +bruising over right anterior deltoid  Skin:    Capillary Refill: Capillary refill takes less than 2 seconds.  Neurological:     General: No focal deficit present.     Mental Status: She is alert and oriented to person, place, and time.     Comments: Able to wiggle her right fingers. Normal sensation right hand.    The results of significant diagnostics from this hospitalization (including imaging,  microbiology,  ancillary and laboratory) are listed below for reference.    Microbiology: Recent Results (from the past 240 hours)  CULTURE, URINE COMPREHENSIVE     Status: Abnormal   Collection Time: 10/19/23  1:20 PM   Specimen: Urine   UR  Result Value Ref Range Status   Urine Culture, Comprehensive Final report (A)  Final   Organism ID, Bacteria Escherichia coli (A)  Final    Comment: Cefazolin <=4 ug/mL Cefazolin with an MIC <=16 predicts susceptibility to the oral agents cefaclor, cefdinir, cefpodoxime, cefprozil, cefuroxime, cephalexin, and loracarbef when used for therapy of uncomplicated urinary tract infections due to E. coli, Klebsiella pneumoniae, and Proteus mirabilis. 25,000-50,000 colony forming units per mL    ANTIMICROBIAL SUSCEPTIBILITY Comment  Final    Comment:       ** S = Susceptible; I = Intermediate; R = Resistant **                    P = Positive; N = Negative             MICS are expressed in micrograms per mL    Antibiotic                 RSLT#1    RSLT#2    RSLT#3    RSLT#4 Amoxicillin/Clavulanic Acid    S Ampicillin                     S Cefepime                       S Ceftriaxone                    S Cefuroxime                     S Ciprofloxacin                  S Ertapenem                      S Gentamicin                     S Imipenem                       S Levofloxacin                   S Meropenem                      S Nitrofurantoin                 S Piperacillin/Tazobactam        S Tetracycline                   S Tobramycin                     S Trimethoprim/Sulfa             S   Microscopic Examination     Status: Abnormal   Collection Time: 10/19/23  1:20 PM   Urine  Result Value Ref Range Status   WBC, UA >30 (A) 0 - 5 /hpf Final   RBC, Urine 0-2 0 - 2 /hpf Final   Epithelial Cells (non renal) 0-10 0 - 10 /hpf Final   Bacteria, UA Few  None seen/Few Final     Labs: BNP (last 3 results) Recent Labs    10/23/23 2236   BNP 35.5   Basic Metabolic Panel: Recent Labs  Lab 10/23/23 2237 10/24/23 0608  NA 142 140  K 3.8 3.8  CL 103 105  CO2 27 27  GLUCOSE 177* 142*  BUN 15 15  CREATININE 0.91 0.73  CALCIUM 9.9 9.1   CBC: Recent Labs  Lab 10/23/23 2237 10/24/23 0608  WBC 6.5 5.6  HGB 13.0 11.2*  HCT 38.4 32.7*  MCV 93.0 92.4  PLT 152 143*   BNP: Recent Labs  Lab 10/23/23 2236  BNP 35.5   Sepsis Labs Recent Labs  Lab 10/23/23 2237 10/24/23 0608  WBC 6.5 5.6   Microbiology Recent Results (from the past 240 hours)  CULTURE, URINE COMPREHENSIVE     Status: Abnormal   Collection Time: 10/19/23  1:20 PM   Specimen: Urine   UR  Result Value Ref Range Status   Urine Culture, Comprehensive Final report (A)  Final   Organism ID, Bacteria Escherichia coli (A)  Final    Comment: Cefazolin <=4 ug/mL Cefazolin with an MIC <=16 predicts susceptibility to the oral agents cefaclor, cefdinir, cefpodoxime, cefprozil, cefuroxime, cephalexin, and loracarbef when used for therapy of uncomplicated urinary tract infections due to E. coli, Klebsiella pneumoniae, and Proteus mirabilis. 25,000-50,000 colony forming units per mL    ANTIMICROBIAL SUSCEPTIBILITY Comment  Final    Comment:       ** S = Susceptible; I = Intermediate; R = Resistant **                    P = Positive; N = Negative             MICS are expressed in micrograms per mL    Antibiotic                 RSLT#1    RSLT#2    RSLT#3    RSLT#4 Amoxicillin/Clavulanic Acid    S Ampicillin                     S Cefepime                       S Ceftriaxone                    S Cefuroxime                     S Ciprofloxacin                  S Ertapenem                      S Gentamicin                     S Imipenem                       S Levofloxacin                   S Meropenem                      S Nitrofurantoin                 S Piperacillin/Tazobactam        S Tetracycline  S Tobramycin                      S Trimethoprim/Sulfa             S   Microscopic Examination     Status: Abnormal   Collection Time: 10/19/23  1:20 PM   Urine  Result Value Ref Range Status   WBC, UA >30 (A) 0 - 5 /hpf Final   RBC, Urine 0-2 0 - 2 /hpf Final   Epithelial Cells (non renal) 0-10 0 - 10 /hpf Final   Bacteria, UA Few None seen/Few Final    Procedures/Studies: DG Shoulder Right Result Date: 10/23/2023 CLINICAL DATA:  Right arm pain after fall EXAM: RIGHT SHOULDER - 2+ VIEW COMPARISON:  None Available. FINDINGS: Comminuted intra-articular fracture of the surgical neck of the right humerus extending into the greater tuberosity. No dislocation. Advanced arthritis of the [facility] joint. IMPRESSION: Comminuted intra-articular fracture of the surgical neck of the right humerus. Electronically Signed   By: Minerva Fester M.D.   On: 10/23/2023 21:10   CT Head Wo Contrast Result Date: 10/23/2023 CLINICAL DATA:  Head trauma, minor (Age >= 65y); Neck trauma (Age >= 65y) EXAM: CT HEAD WITHOUT CONTRAST CT CERVICAL SPINE WITHOUT CONTRAST TECHNIQUE: Multidetector CT imaging of the head and cervical spine was performed following the standard protocol without intravenous contrast. Multiplanar CT image reconstructions of the cervical spine were also generated. RADIATION DOSE REDUCTION: This exam was performed according to the departmental dose-optimization program which includes automated exposure control, adjustment of the mA and/or kV according to patient size and/or use of iterative reconstruction technique. COMPARISON:  None Available. FINDINGS: CT HEAD FINDINGS Brain: No evidence of acute infarction, hemorrhage, hydrocephalus, extra-axial collection or mass lesion/mass effect. Vascular: Calcific atherosclerosis. Skull: No acute fracture. Sinuses/Orbits: Clear sinuses.  No acute orbital findings. Other: No mastoid effusions. CT CERVICAL SPINE FINDINGS Alignment: No substantial sagittal subluxation. Skull base and vertebrae: No acute  fracture. Soft tissues and spinal canal: No prevertebral fluid or swelling. No visible canal hematoma. Disc levels:  Moderate multilevel degenerative change. Upper chest: Visualized lung apices are clear IMPRESSION: No evidence of acute abnormality intracranially or in the cervical spine. Electronically Signed   By: Feliberto Harts M.D.   On: 10/23/2023 21:03   CT Cervical Spine Wo Contrast Result Date: 10/23/2023 CLINICAL DATA:  Head trauma, minor (Age >= 65y); Neck trauma (Age >= 65y) EXAM: CT HEAD WITHOUT CONTRAST CT CERVICAL SPINE WITHOUT CONTRAST TECHNIQUE: Multidetector CT imaging of the head and cervical spine was performed following the standard protocol without intravenous contrast. Multiplanar CT image reconstructions of the cervical spine were also generated. RADIATION DOSE REDUCTION: This exam was performed according to the departmental dose-optimization program which includes automated exposure control, adjustment of the mA and/or kV according to patient size and/or use of iterative reconstruction technique. COMPARISON:  None Available. FINDINGS: CT HEAD FINDINGS Brain: No evidence of acute infarction, hemorrhage, hydrocephalus, extra-axial collection or mass lesion/mass effect. Vascular: Calcific atherosclerosis. Skull: No acute fracture. Sinuses/Orbits: Clear sinuses.  No acute orbital findings. Other: No mastoid effusions. CT CERVICAL SPINE FINDINGS Alignment: No substantial sagittal subluxation. Skull base and vertebrae: No acute fracture. Soft tissues and spinal canal: No prevertebral fluid or swelling. No visible canal hematoma. Disc levels:  Moderate multilevel degenerative change. Upper chest: Visualized lung apices are clear IMPRESSION: No evidence of acute abnormality intracranially or in the cervical spine. Electronically Signed   By: Juluis Mire.D.  On: 10/23/2023 21:03    Time coordinating discharge: 45 mins  SIGNED:  Carollee Herter, DO [facility] 10/24/23, 10:39  AM

## 2023-10-26 DIAGNOSIS — S42291D Other displaced fracture of upper end of right humerus, subsequent encounter for fracture with routine healing: Secondary | ICD-10-CM | POA: Diagnosis not present

## 2023-10-27 ENCOUNTER — Ambulatory Visit: Payer: Self-pay | Admitting: Physician Assistant

## 2023-10-29 ENCOUNTER — Ambulatory Visit: Payer: Self-pay | Admitting: Physician Assistant

## 2023-10-29 DIAGNOSIS — J40 Bronchitis, not specified as acute or chronic: Secondary | ICD-10-CM | POA: Diagnosis not present

## 2023-11-03 ENCOUNTER — Ambulatory Visit: Payer: Self-pay | Admitting: Physician Assistant

## 2023-11-04 ENCOUNTER — Ambulatory Visit: Payer: PPO | Admitting: Student in an Organized Health Care Education/Training Program

## 2023-11-10 ENCOUNTER — Ambulatory Visit: Payer: PPO | Admitting: Physician Assistant

## 2023-11-10 DIAGNOSIS — N390 Urinary tract infection, site not specified: Secondary | ICD-10-CM

## 2023-11-10 DIAGNOSIS — N3281 Overactive bladder: Secondary | ICD-10-CM | POA: Diagnosis not present

## 2023-11-10 LAB — URINALYSIS, COMPLETE
Bilirubin, UA: NEGATIVE
Glucose, UA: NEGATIVE
Leukocytes,UA: NEGATIVE
Nitrite, UA: NEGATIVE
RBC, UA: NEGATIVE
Specific Gravity, UA: >1.03 — ABNORMAL HIGH (ref 1.005–1.030)
Urobilinogen, Ur: 0.2 mg/dL (ref 0.2–1.0)
pH, UA: 5.5 (ref 5.0–7.5)

## 2023-11-10 LAB — MICROSCOPIC EXAMINATION: Epithelial Cells (non renal): >10 /[HPF] — AB (ref 0–10)

## 2023-11-10 NOTE — Patient Instructions (Signed)

## 2023-11-10 NOTE — Progress Notes (Signed)
 PTNS   Session # 5 of 45   Health & Social Factors: right shoulder fracture, dysuria Caffeine: 1 Alcohol: 0 Daytime voids #per day: 4-6 Night-time voids #per night: 3 Urgency: strong Incontinence Episodes #per day: overnight Ankle used: left Treatment Setting:15 Feeling/ Response: sensory Comments: Patient tolerated well   Performed By: Carman Ching, PA-C   Additional notes: Will leave UA on the way out and I'll call her with her results; anticipate restarting suppressive abx.   Follow Up: 1 week

## 2023-11-12 ENCOUNTER — Ambulatory Visit: Payer: PPO | Admitting: Physician Assistant

## 2023-11-13 LAB — CULTURE, URINE COMPREHENSIVE

## 2023-11-17 ENCOUNTER — Ambulatory Visit: Payer: PPO | Admitting: Physician Assistant

## 2023-11-17 DIAGNOSIS — N3281 Overactive bladder: Secondary | ICD-10-CM

## 2023-11-17 DIAGNOSIS — R300 Dysuria: Secondary | ICD-10-CM | POA: Diagnosis not present

## 2023-11-17 NOTE — Patient Instructions (Addendum)
 Continue topical vaginal estrogen cream. On the days you aren't using this, try the over-the-counter nonhormonal vaginal moisturizer called Replens.  Tracking Your Bladder Symptoms   Patient Name:___________________________________________________  Example: Day   Daytime Voids  Nighttime Voids Urgency for the Day (none, mild, strong, severe) Number of Accidents/ Leaks Beverage Comments  Monday IIII II Strong I Water IIII Coffee  I     Week Starting:____________________________________  Day Daytime  Voids Nighttime  Voids Urgency for the Day (none, mild, strong, severe) Number of Accidents/ Leaks Beverages Comments                                                           This week my symptoms were:  O much better O better O the same O worse

## 2023-11-17 NOTE — Progress Notes (Signed)
 PTNS  Session # 6 of 45  Health & Social Factors: no change Caffeine: 1 Alcohol: 0 Daytime voids #per day: 5-6 Night-time voids #per night: 3 Urgency: strong Incontinence Episodes #per day: <1 Ankle used: left Treatment Setting: 8 Feeling/ Response: sensory Comments: Patient tolerated well. We discussed her urine testing from last week is consistent with sample contamination, low suspicion for UTI. She clarifies that she is experiencing intermittent vulvar burning consistent with GSM. Will defer abx, continue estrogen cream, and I gave her info for Replens to give some relief.  Performed By: Carman Ching, PA-C   Follow Up: 1 week

## 2023-11-24 ENCOUNTER — Ambulatory Visit: Payer: PPO | Admitting: Physician Assistant

## 2023-11-24 DIAGNOSIS — N3281 Overactive bladder: Secondary | ICD-10-CM

## 2023-11-24 NOTE — Patient Instructions (Signed)

## 2023-11-24 NOTE — Progress Notes (Signed)
 PTNS   Session # 7 of 45   Health & Social Factors: no change Caffeine: 1 Alcohol: 0 Daytime voids #per day: 5-6 Night-time voids #per night: 3 Urgency: strong Incontinence Episodes #per day: <1 Ankle used: left Treatment Setting: 4 Feeling/ Response: sensory Comments: Patient tolerated well.   Performed By: Carman Ching, PA-C    Follow Up: 1 week

## 2023-12-01 ENCOUNTER — Ambulatory Visit: Payer: PPO | Admitting: Physician Assistant

## 2023-12-01 DIAGNOSIS — N3281 Overactive bladder: Secondary | ICD-10-CM

## 2023-12-01 NOTE — Patient Instructions (Signed)

## 2023-12-01 NOTE — Progress Notes (Signed)
 PTNS  Session # 8 of 45  Health & Social Factors: No change Caffeine: 1 Alcohol: 0 Daytime voids #per day: 5-6 Night-time voids #per night: 2-3 Urgency: none Incontinence Episodes #per day: <1 Ankle used: left Treatment Setting: 13 Feeling/ Response: sensory Comments: Patient tolerated well.  Performed By: Carman Ching, PA-C   Follow Up: 1 week

## 2023-12-08 ENCOUNTER — Ambulatory Visit: Payer: PPO | Admitting: Physician Assistant

## 2023-12-08 DIAGNOSIS — N3281 Overactive bladder: Secondary | ICD-10-CM

## 2023-12-08 NOTE — Patient Instructions (Signed)

## 2023-12-08 NOTE — Progress Notes (Signed)
 PTNS  Session # 9 of 45  Health & Social Factors: no change Caffeine: 1 Alcohol: 0 Daytime voids #per day: 6 Night-time voids #per night: 2-4 Urgency: none Incontinence Episodes #per day: 0 Ankle used: left Treatment Setting: 17 Feeling/ Response: sensory Comments: Patient tolerated well.  Performed By: Carman Ching, PA-C   Follow Up: 1 week

## 2023-12-15 ENCOUNTER — Ambulatory Visit: Payer: PPO | Admitting: Physician Assistant

## 2023-12-15 DIAGNOSIS — N3281 Overactive bladder: Secondary | ICD-10-CM

## 2023-12-15 NOTE — Patient Instructions (Signed)

## 2023-12-15 NOTE — Progress Notes (Signed)
 PTNS   Session # 10 of 45   Health & Social Factors: no change Caffeine: 1 Alcohol: 0 Daytime voids #per day: 6 Night-time voids #per night: 2-4 Urgency: none Incontinence Episodes #per day: 0 Ankle used: left Treatment Setting: 13 Feeling/ Response: sensory Comments: Patient tolerated well.   Performed By: Ples Specter CMA   Follow Up: 1 week

## 2023-12-22 ENCOUNTER — Ambulatory Visit: Payer: PPO | Admitting: Physician Assistant

## 2023-12-22 DIAGNOSIS — N3281 Overactive bladder: Secondary | ICD-10-CM

## 2023-12-22 NOTE — Patient Instructions (Signed)

## 2023-12-22 NOTE — Progress Notes (Signed)
 PTNS  Session # 11 of 45  Health & Social Factors: no change Caffeine: 1 Alcohol: 0 Daytime voids #per day: 6-8 Night-time voids #per night: 2 Urgency: none Incontinence Episodes #per day: 0 Ankle used: left Treatment Setting: 8 Feeling/ Response: Both Comments: Patient tolerated well  Performed By: Carman Ching, PA-C   Follow Up: 1 week

## 2023-12-29 ENCOUNTER — Ambulatory Visit: Payer: PPO | Admitting: Physician Assistant

## 2023-12-29 DIAGNOSIS — N3281 Overactive bladder: Secondary | ICD-10-CM | POA: Diagnosis not present

## 2023-12-29 NOTE — Progress Notes (Signed)
 PTNS   Session # 12 of 45   Health & Social Factors: no change Caffeine: 1 Alcohol: 0 Daytime voids #per day: 6-8 Night-time voids #per night: 2 Urgency: none Incontinence Episodes #per day: 0 Ankle used: left Treatment Setting:14 Feeling/ Response: sensory Comments: Patient tolerated well   Performed By: Benay Pike, CMA and Carman Ching, PA-C

## 2024-01-07 ENCOUNTER — Ambulatory Visit (INDEPENDENT_AMBULATORY_CARE_PROVIDER_SITE_OTHER)

## 2024-01-07 ENCOUNTER — Ambulatory Visit
Admission: EM | Admit: 2024-01-07 | Discharge: 2024-01-07 | Disposition: A | Discharge status: Home / Self Care | Attending: Family Medicine | Admitting: Family Medicine

## 2024-01-07 DIAGNOSIS — R0781 Pleurodynia: Secondary | ICD-10-CM

## 2024-01-07 MED ORDER — OMEPRAZOLE 40 MG PO CPDR: 40.0000 mg | DELAYED_RELEASE_CAPSULE | Freq: Every day | ORAL | 0 refills | Status: DC

## 2024-01-07 MED ORDER — MELOXICAM 7.5 MG PO TABS: 7.5000 mg | ORAL_TABLET | Freq: Every day | ORAL | 0 refills | Status: DC | PRN

## 2024-01-07 NOTE — ED Triage Notes (Signed)
 Patient presenting with left side pain onset yesterday. Patient states she was doing physical therapy with the right arm for a previous break but felt a pulling sensation on the left side.  Denies urinary symptoms. Prescriptions or OTC medications tried: Yes- tylenol     with no relief

## 2024-01-07 NOTE — ED Provider Notes (Addendum)
 [facility]    CSN: [id number] Arrival date & time: 01/07/24  1400      History   Chief Complaint Chief Complaint  Patient presents with   Flank Pain    HPI Purvi Ruehl is a 89 y.o. female.    Flank Pain  Here for pain in her left lower rib cage posteriorly.  Yesterday when she was doing physical therapy for her right shoulder and arm, she felt sudden onset of pain in her left posterior rib cage just posterior to the posterior axillary line.  She maybe felt a pop.  No fall or trauma. No cough or fever  She has been taking Tylenol without much relief.  She is allergic to codeine, hydrocodone oxycodone and tramadol.  They all cause disorientation and dizziness.  She is also allergic to Macrodantin.   Pain worsens with any movement.   Past Medical History:  Diagnosis Date   Benign essential HTN 09/21/2015   Cancer (HCC)    Skin CA resected from Right eyelid and both legs.   Neuropathy    Seizures (HCC)    Thyroid disease     Patient Active Problem List   Diagnosis Date Noted   DNR (do not resuscitate)/DNI(Do Not Intubate) 10/24/2023   Fracture, shoulder, right, closed, initial encounter 10/23/2023   Fall at home, initial encounter 10/23/2023   HLD (hyperlipidemia) 10/23/2023   Chronic diastolic CHF (congestive heart failure) (HCC) 10/23/2023   Piriformis syndrome of both sides 08/17/2023   Chronic pain of right knee 07/29/2023   Bilateral primary osteoarthritis of hip 01/22/2022   Bilateral occipital neuralgia 09/24/2020   Cervical facet joint syndrome 03/05/2020   Cervical spondylosis 03/01/2020   SI joint arthritis (HCC) 06/23/2019   Chronic SI joint pain 06/23/2019   Carpal tunnel syndrome 06/07/2019   Low back pain 06/07/2019   Major depressive disorder with single episode, in full remission (HCC) 06/07/2019   Osteoarthritis of knee 06/07/2019   Pain in limb 06/07/2019   SOBOE (shortness of breath on exertion) 10/14/2018    Lumbar radiculopathy 07/22/2018   Lumbar disc herniation (L2/L3) 07/22/2018   Lumbar degenerative disc disease 07/22/2018   Chronic pain syndrome 07/22/2018   Neuropathy 07/22/2018   Localized, primary osteoarthritis 01/14/2018   Trochanteric bursitis of left hip 01/14/2018   Left leg pain 04/24/2016   Mixed hyperlipidemia 02/05/2016   History of fall 01/24/2016   Sensory ataxia 01/24/2016   Benign essential HTN 09/21/2015   Moderate tricuspid insufficiency 09/21/2015   Numbness in feet 04/12/2015   Mild vitamin D deficiency 10/23/2014   Allergic rhinitis 05/15/2014   Bilateral hip pain 09/06/2013   Dysphagia 04/15/2013   Squamous cell carcinoma 04/15/2013   Thyroid nodule 04/15/2013   Spondylosis of lumbar joint 12/07/2012   Stenosis of lumbosacral spine 12/07/2012   IBS (irritable bowel syndrome) 04/12/2012   Acquired hypothyroidism 06/24/2011   Osteopenia 05/14/2011    Past Surgical History:  Procedure Laterality Date   ABDOMINAL HYSTERECTOMY     BREAST BIOPSY Bilateral    neg   BREAST EXCISIONAL BIOPSY Right    KNEE ARTHROSCOPY W/ LATERAL RETINACULAR REPAIR      OB History   No obstetric history on file.      Home Medications    Prior to Admission medications   Medication Sig Start Date End Date Taking? Authorizing Provider  DULoxetine (CYMBALTA) 60 MG capsule Take 60 mg by mouth daily. 12/26/23  Yes [provider]  meloxicam (MOBIC) 7.5 MG tablet  Take 1 tablet (7.5 mg total) by mouth daily as needed for pain. 01/07/24  Yes Fujiko Picazo K, MD  omeprazole (PRILOSEC) 40 MG capsule Take 1 capsule (40 mg total) by mouth daily. 01/07/24  Yes Verlena Marlette K, MD  acetaminophen (TYLENOL) 500 MG tablet Take 1,000 mg by mouth every 6 (six) hours as needed.   Yes [provider]  alendronate (FOSAMAX) 70 MG tablet Take 70 mg by mouth every 7 (seven) days. 01/26/21  Yes [provider]  Apoaequorin (PREVAGEN PO) Take 75 mg by mouth in the  morning, at noon, and at bedtime.   Yes [provider]  beta carotene w/minerals (OCUVITE) tablet Take 1 tablet by mouth daily.   Yes [provider]  donepezil (ARICEPT ODT) 10 MG disintegrating tablet Take 10 mg by mouth at bedtime. 05/15/21  Yes [provider]  dorzolamide (TRUSOPT) 2 % ophthalmic solution Place 1 drop into both eyes 3 (three) times daily.   Yes [provider]  estradiol (ESTRACE) 0.1 MG/GM vaginal cream Estrogen Cream Instruction Discard applicator Apply pea sized amount to tip of finger to urethra before bed. Wash hands well after application. Use Monday, Wednesday and Friday 12/12/22  Yes Vaillancourt, Samantha, PA-C  fexofenadine (ALLEGRA) 180 MG tablet Take 180 mg by mouth daily.   Yes [provider]  levothyroxine (SYNTHROID) 100 MCG tablet Take 100 mcg by mouth daily. 09/05/20  Yes [provider]  lovastatin (MEVACOR) 20 MG tablet Take 20 mg by mouth daily. 01/04/20  Yes [provider]  LUMIGAN 0.01 % SOLN Place 1 drop into both eyes at bedtime. 12/25/20  Yes [provider]  Melatonin 1 MG TABS Take 10 mg by mouth once.   Yes [provider]  nortriptyline (PAMELOR) 10 MG capsule Take 10 mg by mouth at bedtime. 01/16/21  Yes [provider]  polyethylene glycol powder (GLYCOLAX/MIRALAX) 17 GM/SCOOP powder Take by mouth. 03/29/20  Yes [provider]  pregabalin (LYRICA) 75 MG capsule Take 75 mg by mouth 3 (three) times daily.   Yes [provider]  Probiotic, Lactobacillus, CAPS Take by mouth.   Yes [provider]    Family History Family History  Problem Relation Age of Onset   Breast cancer Mother 2   Breast cancer Maternal Uncle 96   Breast cancer Maternal Aunt        mat great aunt   Breast cancer Maternal Aunt 77   Tracheal cancer Father    Cancer Sister    Brain cancer Sister    Cancer Brother     Social History Social History    Tobacco Use   Smoking status: Never   Smokeless tobacco: Never  Vaping Use   Vaping status: Never Used  Substance Use Topics   Alcohol use: No   Drug use: Never     Allergies   Macrobid [nitrofurantoin], Codeine, Hydrocodone, Oxycodone, Peanut-containing drug products, Peanuts [peanut oil], Penicillins, and Tramadol   Review of Systems Review of Systems  Genitourinary:  Positive for flank pain.     Physical Exam Triage Vital Signs ED Triage Vitals  Encounter Vitals Group     BP 01/07/24 1450 127/84     Systolic BP Percentile --      Diastolic BP Percentile --      Pulse Rate 01/07/24 1450 83     Resp 01/07/24 1450 16     Temp 01/07/24 1450 98.2 F (36.8 C)     Temp Source  01/07/24 1450 Oral     SpO2 01/07/24 1450 96 %     Weight --      Height --      Head Circumference --      Peak Flow --      Pain Score 01/07/24 1447 9     Pain Loc --      Pain Education --      Exclude from Growth Chart --    No data found.  Updated Vital Signs BP 127/84 (BP Location: Left Arm)   Pulse 83   Temp 98.2 F (36.8 C) (Oral)   Resp 16   SpO2 96%   Visual Acuity Right Eye Distance:   Left Eye Distance:   Bilateral Distance:    Right Eye Near:   Left Eye Near:    Bilateral Near:     Physical Exam Vitals reviewed.  Constitutional:      General: She is not in acute distress.    Appearance: She is not toxic-appearing.  Cardiovascular:     Rate and Rhythm: Normal rate and regular rhythm.     Heart sounds: No murmur heard. Pulmonary:     Effort: No respiratory distress.     Breath sounds: No stridor. No wheezing, rhonchi or rales.  Chest:     Chest wall: Tenderness (left lower rib cage, just medial to posterior axillary line. No rash or deformity or erythema) present.  Skin:    Coloration: Skin is not jaundiced or pale.  Neurological:     General: No focal deficit present.     Mental Status: She is alert and oriented to person, place, and time.  Psychiatric:         Behavior: Behavior normal.      UC Treatments / Results  Labs (all labs ordered are listed, but only abnormal results are displayed) Labs Reviewed - No data to display  EKG   Radiology No results found.  Procedures Procedures (including critical care time)  Medications Ordered in UC Medications - No data to display  Initial Impression / Assessment and Plan / UC Course  I have reviewed the triage vital signs and the nursing notes.  Pertinent labs & imaging results that were available during my care of the patient were reviewed by me and considered in my medical decision making (see chart for details).     There are no rib fractures by my review. She is advised of radiology overread.   Mobic 7.5 mg once daily is sent in with 10 days of prilosec to protect her stomach. I have asked her to f/u with her pcp.   Of note she does have a history of her right humeral fracture for which she was getting the discal therapy done Final Clinical Impressions(s) / UC Diagnoses   Final diagnoses:  Rib pain on left side     Discharge Instructions      By my review, there are no fractures of your bones on the x-rays  The radiologist will also read your x-ray, and if their interpretation differs significantly from mine, and the management of your condition would change, we will call you.  Meloxicam 7.5 mg--1 daily as needed for pain.  Take omeprazole 40 mg--1 capsule daily; this is meant to protect your stomach, since the meloxicam can irritate the stomach lining  You could also get lidocaine patches over-the-counter and applied to the painful area.  Please follow-up with your primary care about this issue  ED Prescriptions     Medication Sig Dispense Auth. Provider   meloxicam (MOBIC) 7.5 MG tablet Take 1 tablet (7.5 mg total) by mouth daily as needed for pain. 10 tablet Ryott Rafferty K, MD   omeprazole (PRILOSEC) 40 MG capsule Take 1 capsule (40 mg total) by  mouth daily. 10 capsule Mazie Fencl K, MD      PDMP not reviewed this encounter.   Ann Keto, MD 01/07/24 1547    Ann Keto, MD 01/07/24 [phone]

## 2024-01-07 NOTE — Discharge Instructions (Signed)
 By my review, there are no fractures of your bones on the x-rays  The radiologist will also read your x-ray, and if their interpretation differs significantly from mine, and the management of your condition would change, we will call you.  Meloxicam 7.5 mg--1 daily as needed for pain.  Take omeprazole 40 mg--1 capsule daily; this is meant to protect your stomach, since the meloxicam can irritate the stomach lining  You could also get lidocaine patches over-the-counter and applied to the painful area.  Please follow-up with your primary care about this issue

## 2024-01-19 ENCOUNTER — Encounter: Payer: Self-pay | Admitting: Student in an Organized Health Care Education/Training Program

## 2024-01-19 ENCOUNTER — Ambulatory Visit
Attending: Student in an Organized Health Care Education/Training Program | Admitting: Student in an Organized Health Care Education/Training Program

## 2024-01-19 ENCOUNTER — Ambulatory Visit: Admitting: Physician Assistant

## 2024-01-19 VITALS — BP 151/94 | HR 79 | Temp 97.9°F | Resp 18 | Ht 68.0 in | Wt 167.0 lb

## 2024-01-19 DIAGNOSIS — M47816 Spondylosis without myelopathy or radiculopathy, lumbar region: Secondary | ICD-10-CM | POA: Diagnosis not present

## 2024-01-19 DIAGNOSIS — G894 Chronic pain syndrome: Secondary | ICD-10-CM | POA: Insufficient documentation

## 2024-01-19 NOTE — Progress Notes (Signed)
 Safety precautions to be maintained throughout the outpatient stay will include: orient to surroundings, keep bed in low position, maintain call bell within reach at all times, provide assistance with transfer out of bed and ambulation.

## 2024-01-19 NOTE — Patient Instructions (Signed)

## 2024-01-19 NOTE — Progress Notes (Signed)
 PROVIDER NOTE: Information contained herein reflects review and annotations entered in association with encounter. Interpretation of such information and data should be left to medically-trained personnel. Information provided to patient can be located elsewhere in the medical record under "Patient Instructions". Document created using STT-dictation technology, any transcriptional errors that may result from process are unintentional.    Patient: Sarah [name]  Service Category: E/M  Provider: Cephus Collin, MD  DOB: 02-16-1932  DOS: 01/19/2024  Referring Provider: Alexander Anes, MD  MRN: [id number]  Specialty: Interventional Pain Management  PCP: Alexander Anes, MD  Type: Established Patient  Setting: Ambulatory outpatient    Location: Office  Delivery: Face-to-face     HPI  Sarah [name], a 89 y.o. year old female, is here today because of her Lumbar facet arthropathy [M47.816]. Ms. Knott primary complain today is Back Pain  Pertinent problems: Ms. Stull has Lumbar facet arthropathy; Lumbar radiculopathy; Lumbar disc herniation (L2/L3); Lumbar degenerative disc disease; Chronic pain syndrome; Neuropathy; Thyroid  nodule; and Bilateral occipital neuralgia on their pertinent problem list. Pain Assessment: Severity of Chronic pain is reported as a 8 /10. Location: Back Lower, Right, Left/Denies. Onset: More than a month ago. Quality: Constant, Aching. Timing: Constant. Modifying factor(s): Denies but sitting helps some. Vitals:  height is 5\' 8"  (1.727 m) and weight is 167 lb (75.8 kg). Her temperature is 97.9 F (36.6 C). Her blood pressure is 151/94 (abnormal) and her pulse is 79. Her respiration is 18 and oxygen saturation is 98%.  BMI: Estimated body mass index is 25.39 kg/m as calculated from the following:   Height as of this encounter: 5\' 8"  (1.727 m).   Weight as of this encounter: 167 lb (75.8 kg). Last encounter: 12/04/2022. Last procedure: 01/05/2023.  Reason  for encounter:   Discussed the use of AI scribe software for clinical note transcription with the patient, who gave verbal consent to proceed.  History of Present Illness   Sarah [name] is an 89 year old female with lumbar spine arthritis and degeneration who presents with increased lower back pain.  She has experienced increased pain in her lower back, specifically in the lower lumbar region, which has been persistent since a fall. The pain is located in the same spot as previous episodes.  A CT scan revealed arthritis and degeneration in the lower lumbar spine. She underwent a bilateral L3, L4, L5 medial branch nerve ablation procedure almost a year ago (July 2024) which provided approximately 80% pain relief for 6 months.  She is interested in repeating this.  Currently, she is attending physical therapy twice a week, with sessions scheduled until Feb 20, 2024. She manages her pain with Tylenol  Arthritis, taken every eight hours, as she cannot take ibuprofen  due to stomach issues.        ROS  Constitutional: Denies any fever or chills Gastrointestinal: No reported hemesis, hematochezia, vomiting, or acute GI distress Musculoskeletal:  +LBP Neurological: No reported episodes of acute onset apraxia, aphasia, dysarthria, agnosia, amnesia, paralysis, loss of coordination, or loss of consciousness  Medication Review  Apoaequorin, DULoxetine , Probiotic (Lactobacillus), acetaminophen , alendronate, beta carotene w/minerals, bimatoprost, donepezil, dorzolamide , estradiol , fexofenadine, levothyroxine , lovastatin, melatonin, meloxicam , nortriptyline, omeprazole , polyethylene glycol powder, and pregabalin   History Review  Allergy: Ms. Rotolo is allergic to macrobid  [nitrofurantoin ], codeine, hydrocodone, oxycodone , peanut-containing drug products, peanuts [peanut oil], penicillins, and tramadol. Drug: Ms. Konja  reports no history of drug use. Alcohol:  reports no history of alcohol  use. Tobacco:  reports that  she has never smoked. She has never used smokeless tobacco. Social: Ms. Hersey  reports that she has never smoked. She has never used smokeless tobacco. She reports that she does not drink alcohol and does not use drugs. Medical:  has a past medical history of Benign essential HTN (09/21/2015), Cancer (HCC), Neuropathy, Seizures (HCC), and Thyroid  disease. Surgical: Ms. Koelsch  has a past surgical history that includes Abdominal hysterectomy; Breast biopsy (Bilateral); Breast excisional biopsy (Right); and Knee arthroscopy w/ lateral retinacular repair. Family: family history includes Brain cancer in her sister; Breast cancer in her maternal aunt; Breast cancer (age of onset: 89) in her maternal aunt; Breast cancer (age of onset: 94) in her mother; Breast cancer (age of onset: 53) in her maternal uncle; Cancer in her brother and sister; Tracheal cancer in her father.  Laboratory Chemistry Profile   Renal Lab Results  Component Value Date   BUN 15 10/24/2023   CREATININE 0.73 10/24/2023   GFRAA 77 09/12/2020   GFRNONAA >60 10/24/2023    Hepatic Lab Results  Component Value Date   AST 19 11/26/2015   ALT 15 11/26/2015   ALBUMIN 4.7 11/26/2015   ALKPHOS 64 11/26/2015    Electrolytes Lab Results  Component Value Date   NA 140 10/24/2023   K 3.8 10/24/2023   CL 105 10/24/2023   CALCIUM 9.1 10/24/2023   MG 2.3 01/20/2023    Bone No results found for: "VD25OH", "VD125OH2TOT", "AO1308MV7", "QI6962XB2", "25OHVITD1", "25OHVITD2", "25OHVITD3", "TESTOFREE", "TESTOSTERONE"  Inflammation (CRP: Acute Phase) (ESR: Chronic Phase) No results found for: "CRP", "ESRSEDRATE", "LATICACIDVEN"       Note: Above Lab results reviewed.  Recent Imaging Review  DG Ribs Unilateral W/Chest Left CLINICAL DATA:  Fall.  EXAM: LEFT RIBS AND CHEST - 3+ VIEW  COMPARISON:  Chest x-ray 04/23/2022  FINDINGS: No fracture or other bone lesions are seen involving the ribs. Chronic  right proximal humeral fracture is present with callus formation. There is no evidence of pneumothorax or pleural effusion. Both lungs are clear. Heart size and mediastinal contours are within normal limits.  IMPRESSION: 1. No evidence of acute rib fracture. 2. Chronic right proximal humeral fracture.  Electronically Signed   By: Tyron Gallon M.D.   On: 01/07/2024 18:07  Recent CT scan done 01/14/2024 shows severe lumbar degenerative changes in the lumbar spine with severe lumbar degenerative disc disease, lumbar facet arthropathy at L3, L4, L4-L5 and L5-S1.   Note: Reviewed        Physical Exam  General appearance: Well nourished, well developed, and well hydrated. In no apparent acute distress Mental status: Alert, oriented x 3 (person, place, & time)       Respiratory: No evidence of acute respiratory distress Eyes: PERLA Vitals: BP (!) 151/94 (Patient Position: Sitting, Cuff Size: Normal)   Pulse 79   Temp 97.9 F (36.6 C)   Resp 18   Ht 5\' 8"  (1.727 m)   Wt 167 lb (75.8 kg)   SpO2 98%   BMI 25.39 kg/m  BMI: Estimated body mass index is 25.39 kg/m as calculated from the following:   Height as of this encounter: 5\' 8"  (1.727 m).   Weight as of this encounter: 167 lb (75.8 kg). Ideal: Ideal body weight: 63.9 kg (140 lb 14 oz) Adjusted ideal body weight: 68.6 kg (151 lb 5.2 oz)  Lumbar Spine Area Exam  Skin & Axial Inspection: No masses, redness, or swelling Alignment: Symmetrical Functional ROM: Pain restricted ROM affecting both sides Stability:  No instability detected Muscle Tone/Strength: Functionally intact. No obvious neuro-muscular anomalies detected. Sensory (Neurological): Musculoskeletal pain pattern Palpation: Complains of area being tender to palpation       Provocative Tests: Hyperextension/rotation test: (+) bilaterally for facet joint pain. Lumbar quadrant test (Kemp's test): (+) bilaterally for facet joint pain.   Gait & Posture Assessment   Ambulation: Unassisted Gait: Relatively normal for age and body habitus Posture: WNL  Lower Extremity Exam      Side: Right lower extremity   Side: Left lower extremity  Stability: No instability observed           Stability: No instability observed          Skin & Extremity Inspection: Skin color, temperature, and hair growth are WNL. No peripheral edema or cyanosis. No masses, redness, swelling, asymmetry, or associated skin lesions. No contractures.   Skin & Extremity Inspection: Skin color, temperature, and hair growth are WNL. No peripheral edema or cyanosis. No masses, redness, swelling, asymmetry, or associated skin lesions. No contractures.  Functional ROM: Unrestricted ROM                   Functional ROM: Unrestricted ROM                  Muscle Tone/Strength: Functionally intact. No obvious neuro-muscular anomalies detected.   Muscle Tone/Strength: Functionally intact. No obvious neuro-muscular anomalies detected.  Sensory (Neurological): Unimpaired         Sensory (Neurological): Unimpaired        DTR: Patellar: deferred today Achilles: deferred today Plantar: deferred today   DTR: Patellar: deferred today Achilles: deferred today Plantar: deferred today  Palpation: No palpable anomalies   Palpation: No palpable anomalies       Assessment   Diagnosis  1. Lumbar facet arthropathy (L4/5, L5/S1)   2. Spondylosis of lumbar joint   3. Chronic pain syndrome       Plan of Care  Assessment and Plan    Arthritis of lumbar spine   Chronic arthritis in the lower lumbar spine has worsened following a recent fall, likely due to inflammation and nerve irritation. She is undergoing physical therapy twice a week until the end of May. Continue this therapy schedule. Maintain Tylenol  Arthritis every eight hours for pain management. Schedule nerve ablation for L3, L4, and L5 bilaterally, similar to the previous procedure done last July, without sedation.  Degeneration of lumbar spine    Lumbar spine degeneration, confirmed by CT scan, contributes to chronic pain and discomfort, worsened by recent trauma. Proceed with nerve ablation as planned to address nerve irritation and pain.       Orders:  Orders Placed This Encounter  Procedures   Radiofrequency,Lumbar    Standing Status:   Future    Expected Date:   02/03/2024    Expiration Date:   04/19/2024    Scheduling Instructions:     Side(s): Bilateral     Level(s):  L3, L4, L5, Medial Branch Nerve(s)     Sedation: With Sedation     Scheduling Timeframe: As soon as pre-approved    Where will this procedure be performed?:   [facility]   Follow-up plan:   Return in about 15 days (around 02/03/2024) for B/L L3, 4, 5 RFA, in clinic NS.      Recent Visits No visits were found meeting these conditions. Showing recent visits within past 90 days and meeting all other requirements Today's Visits Date Type Provider Dept  01/19/24 Office Visit Cephus Collin, MD [facility]  Showing today's visits and meeting all other requirements Future Appointments Date Type Provider Dept  02/03/24 Appointment Cephus Collin, MD [facility]  Showing future appointments within next 90 days and meeting all other requirements  I discussed the assessment and treatment plan with the patient. The patient was provided an opportunity to ask questions and all were answered. The patient agreed with the plan and demonstrated an understanding of the instructions.  Patient advised to call back or seek an in-person evaluation if the symptoms or condition worsens.  Duration of encounter: .  Total time on encounter, as per AMA guidelines included both the face-to-face and non-face-to-face time personally spent by the physician and/or other qualified health care professional(s) on the day of the encounter (includes time in activities that require the physician or other qualified health care professional and does not  include time in activities normally performed by clinical staff). Physician's time may include the following activities when performed: Preparing to see the patient (e.g., pre-charting review of records, searching for previously ordered imaging, lab work, and nerve conduction tests) Review of prior analgesic pharmacotherapies. Reviewing PMP Interpreting ordered tests (e.g., lab work, imaging, nerve conduction tests) Performing post-procedure evaluations, including interpretation of diagnostic procedures Obtaining and/or reviewing separately obtained history Performing a medically appropriate examination and/or evaluation Counseling and educating the patient/family/caregiver Ordering medications, tests, or procedures Referring and communicating with other health care professionals (when not separately reported) Documenting clinical information in the electronic or other health record Independently interpreting results (not separately reported) and communicating results to the patient/ family/caregiver Care coordination (not separately reported)  Note by: Cephus Collin, MD Date: 01/19/2024; Time: 9:37 AM

## 2024-01-20 ENCOUNTER — Other Ambulatory Visit: Payer: Self-pay | Admitting: Family Medicine

## 2024-01-20 ENCOUNTER — Encounter: Payer: Self-pay | Admitting: Physician Assistant

## 2024-01-20 DIAGNOSIS — Z780 Asymptomatic menopausal state: Secondary | ICD-10-CM

## 2024-01-20 DIAGNOSIS — R109 Unspecified abdominal pain: Secondary | ICD-10-CM

## 2024-01-22 ENCOUNTER — Other Ambulatory Visit: Payer: Self-pay | Admitting: Physician Assistant

## 2024-01-26 ENCOUNTER — Ambulatory Visit

## 2024-01-26 ENCOUNTER — Ambulatory Visit
Admission: EM | Admit: 2024-01-26 | Discharge: 2024-01-26 | Disposition: A | Discharge status: Home / Self Care | Attending: Emergency Medicine | Admitting: Emergency Medicine

## 2024-01-26 DIAGNOSIS — S0083XA Contusion of other part of head, initial encounter: Secondary | ICD-10-CM | POA: Diagnosis not present

## 2024-01-26 DIAGNOSIS — S0990XA Unspecified injury of head, initial encounter: Secondary | ICD-10-CM | POA: Insufficient documentation

## 2024-01-26 DIAGNOSIS — S0231XA Fracture of orbital floor, right side, initial encounter for closed fracture: Secondary | ICD-10-CM | POA: Diagnosis not present

## 2024-01-26 DIAGNOSIS — S022XXA Fracture of nasal bones, initial encounter for closed fracture: Secondary | ICD-10-CM | POA: Insufficient documentation

## 2024-01-26 DIAGNOSIS — S0993XA Unspecified injury of face, initial encounter: Secondary | ICD-10-CM | POA: Insufficient documentation

## 2024-01-26 MED ORDER — AMOXICILLIN-POT CLAVULANATE 875-125 MG PO TABS: 1.0000 | ORAL_TABLET | Freq: Two times a day (BID) | ORAL | 0 refills | Status: DC

## 2024-01-26 MED ORDER — ACETAMINOPHEN 325 MG PO TABS
975.0000 mg | ORAL_TABLET | Freq: Once | ORAL | Status: AC
  Administered 2024-01-26: 975 mg via ORAL

## 2024-01-26 MED ORDER — PREDNISONE 20 MG PO TABS: 40.0000 mg | ORAL_TABLET | Freq: Every day | ORAL | 0 refills | Status: AC

## 2024-01-26 NOTE — Discharge Instructions (Signed)
 Finish the Augmentin and the prednisone unless a healthcare provider tells you to stop. Apply intermittent cold therapy over injury site for the first 48 hours to reduce swelling, sleep with head of bed elevated, avoid nose blowing and sniffing.  Tylenol  1000 mg 3-4 times a day as needed for pain.  Go to the emergency department for worsening headache, blurry vision, double vision, eye pain, limited motion of that right eye, or for other concerns

## 2024-01-26 NOTE — ED Triage Notes (Signed)
 Pt presents to UC d/t fall this AM c/o HA,epistaxis & R eye swelling. States was getting up out of chair to go to the bathroom & fell on R side of face. Denies any LOC.

## 2024-01-26 NOTE — ED Provider Notes (Signed)
 HPI  SUBJECTIVE:  Sarah [name] is a 89 y.o. female who presents with facial pain, bruising after standing up from a chair and falling onto her face while wearing glasses at 0400 this morning.  This was an unwitnessed fall, she is not sure why she fell as she states that it "happened too fast".  She was able to get up immediately after the fall.  She denies preceding chest pain, shortness of breath, palpitations, syncope or seizures causing the fall.  She denies preceding or subsequent amnesia, loss of consciousness, nausea, vomiting, eye pain, visual changes.  She reports a dull, achy frontal headache that is not the worst headache of her life.  No photophobia.  She reports epistaxis, but this has resolved.  No nasal congestion, dental injury, neck pain, chest pain, back pain, abdominal pain.  She denies any other injury.  No aggravating or alleviating factors.  She has not tried anything for this. She has a past medical history of hypertension, neuropathy,  thyroid  disease, osteopenia, hyperlipidemia, IBS, CHF, "balance problems", history of falls.  Patient denies history of seizures although this is listed in her chart.  She is not on any antiplatelets, anticoagulants.  No history of arrhythmia, MI, CVA.  PCP: Dava Erichsen  Past Medical History:  Diagnosis Date   Benign essential HTN 09/21/2015   Cancer (HCC)    Skin CA resected from Right eyelid and both legs.   Neuropathy    Seizures (HCC)    Thyroid  disease     Past Surgical History:  Procedure Laterality Date   ABDOMINAL HYSTERECTOMY     BREAST BIOPSY Bilateral    neg   BREAST EXCISIONAL BIOPSY Right    KNEE ARTHROSCOPY W/ LATERAL RETINACULAR REPAIR      Family History  Problem Relation Age of Onset   Breast cancer Mother 2   Breast cancer Maternal Uncle 60   Breast cancer Maternal Aunt        mat great aunt   Breast cancer Maternal Aunt 31   Tracheal cancer Father    Cancer Sister    Brain cancer  Sister    Cancer Brother     Social History   Tobacco Use   Smoking status: Never   Smokeless tobacco: Never  Vaping Use   Vaping status: Never Used  Substance Use Topics   Alcohol use: No   Drug use: Never    No current facility-administered medications for this encounter.  Current Outpatient Medications:    acetaminophen  (TYLENOL ) 500 MG tablet, Take 1,000 mg by mouth every 6 (six) hours as needed., Disp: , Rfl:    alendronate (FOSAMAX) 70 MG tablet, Take 70 mg by mouth every 7 (seven) days., Disp: , Rfl:    Apoaequorin (PREVAGEN PO), Take 75 mg by mouth in the morning, at noon, and at bedtime., Disp: , Rfl:    beta carotene w/minerals (OCUVITE) tablet, Take 1 tablet by mouth daily., Disp: , Rfl:    donepezil (ARICEPT ODT) 10 MG disintegrating tablet, Take 10 mg by mouth at bedtime., Disp: , Rfl:    dorzolamide  (TRUSOPT ) 2 % ophthalmic solution, Place 1 drop into both eyes 3 (three) times daily., Disp: , Rfl:    DULoxetine  (CYMBALTA ) 60 MG capsule, Take 60 mg by mouth daily., Disp: , Rfl:    estradiol  (ESTRACE ) 0.1 MG/GM vaginal cream, APPLY PEA SIZED AMT TO TIP OF FINGER TO URETHRA BEFORE BED. [facility] HANDS WELL AFTER. MONDAY, WEDNESDAY AND FRIDAY, Disp: 42.5 g,  Rfl: 0   fexofenadine (ALLEGRA) 180 MG tablet, Take 180 mg by mouth daily., Disp: , Rfl:    levothyroxine  (SYNTHROID ) 100 MCG tablet, Take 100 mcg by mouth daily., Disp: , Rfl:    lovastatin (MEVACOR) 20 MG tablet, Take 20 mg by mouth daily., Disp: , Rfl:    LUMIGAN 0.01 % SOLN, Place 1 drop into both eyes at bedtime., Disp: , Rfl:    Melatonin 1 MG TABS, Take 10 mg by mouth once., Disp: , Rfl:    nortriptyline (PAMELOR) 10 MG capsule, Take 10 mg by mouth at bedtime., Disp: , Rfl:    polyethylene glycol powder (GLYCOLAX/MIRALAX) 17 GM/SCOOP powder, Take by mouth., Disp: , Rfl:    pregabalin  (LYRICA ) 75 MG capsule, Take 75 mg by mouth 3 (three) times daily., Disp: , Rfl:    Probiotic, Lactobacillus, CAPS, Take by mouth.,  Disp: , Rfl:    meloxicam  (MOBIC ) 7.5 MG tablet, Take 1 tablet (7.5 mg total) by mouth daily as needed for pain., Disp: 10 tablet, Rfl: 0   omeprazole  (PRILOSEC) 40 MG capsule, Take 1 capsule (40 mg total) by mouth daily., Disp: 10 capsule, Rfl: 0  Allergies  Allergen Reactions   Cholecalciferol Nausea Only    cholecalciferol   Macrobid  [Nitrofurantoin ] Shortness Of Breath   Codeine Other (See Comments)    Makes her disoriented  codeine   Hydrocodone Other (See Comments)    Patient states she was seeing things that weren't there and very disoriented.   Oxycodone  Other (See Comments)    hallucinations   Peanut Oil Other (See Comments)    Violent Migraines  peanut oil   Peanut-Containing Drug Products     Other reaction(s): Headache   Penicillins Nausea Only   Tramadol Other (See Comments)    Dizziness.  tramadol     ROS  As noted in HPI.   Physical Exam  BP (!) 147/91 (BP Location: Left Arm)   Pulse 76   Temp (!) 97.5 F (36.4 C) (Oral)   Resp 16   Ht 5\' 8"  (1.727 m)   Wt 74.4 kg   SpO2 97%   BMI 24.94 kg/m   Constitutional: Well developed, well nourished, no acute distress Eyes: PERRL, EOMI, right subconjunctival hemorrhage.  No pain with EOMs.  No proptosis. HENT: Normocephalic, mucus membranes moist.  Bilateral periorbital ecchymosis.  Tenderness right orbital rim, nasal bridge.  Small area of blood on left lateral nare.  No active bleeding.  No septal deviation.  No septal hematoma.  No nasal congestion.  No dental trauma.  No hemotympanums bilaterally.  Negative Battle sign          Respiratory: Clear to auscultation bilaterally, no rales, no wheezing, no rhonchi.  No bruising over the chest Cardiovascular: Normal rate and rhythm, no murmurs, no gallops, no rubs GI: Soft, nondistended, normal bowel sounds, nontender, no rebound, no guarding.  No bruising over the abdomen Back: C, T, L-spine tenderness skin: No rash, skin intact Musculoskeletal: No  edema, no tenderness, no deformities Neurologic: GCD 15, Alert & oriented x 3, CN III-XII intact, no motor deficits, sensation grossly intact.  Finger-nose, heel shin within normal limits.  Difficulty with tandem gait, patient states this is baseline.  Romberg negative. Psychiatric: Speech and behavior appropriate   ED Course   Medications  acetaminophen  (TYLENOL ) tablet 975 mg (975 mg Oral Given 01/26/24 0859)    Orders Placed This Encounter  Procedures   CT Head Wo Contrast    Standing Status:  Standing    Number of Occurrences:   1   CT Maxillofacial Wo Contrast    Standing Status:   Standing    Number of Occurrences:   1   No results found for this or any previous visit (from the past 24 hours). CT Head Wo Contrast Addendum Date: 01/26/2024 ADDENDUM REPORT: 01/26/2024 09:56 ADDENDUM: There is also an acute, minimally displaced right orbital floor fracture with a small amount of extraconal hemorrhage at the orbital floor defect but no extraocular muscle herniation. No retrobulbar hematoma. Findings discussed by telephone with Dr. Mauricia South on 01/26/2024 at 9:52 a.m. Electronically Signed   By: Aundra Lee M.D.   On: 01/26/2024 09:56   Result Date: 01/26/2024 CLINICAL DATA:  Head trauma, minor (Age >= 65y); Facial trauma, blunt. Fall. Headache, epistaxis, and right eye swelling. EXAM: CT HEAD WITHOUT CONTRAST CT MAXILLOFACIAL WITHOUT CONTRAST TECHNIQUE: Multidetector CT imaging of the head and maxillofacial structures were performed using the standard protocol without intravenous contrast. Multiplanar CT image reconstructions of the maxillofacial structures were also generated. RADIATION DOSE REDUCTION: This exam was performed according to the departmental dose-optimization program which includes automated exposure control, adjustment of the mA and/or kV according to patient size and/or use of iterative reconstruction technique. COMPARISON:  Head CT 10/23/2023 and MRI 10/29/2022 FINDINGS: CT  HEAD FINDINGS Brain: There is no evidence of an acute infarct, intracranial hemorrhage, mass, midline shift, or extra-axial fluid collection. Cerebral volume is normal. The ventricles are normal in size. Periventricular white matter hypodensities are unchanged and nonspecific but compatible with mild chronic small vessel ischemic disease. Vascular: No hyperdense vessel. Skull: No acute fracture or suspicious lesion. Other: None. CT MAXILLOFACIAL FINDINGS Osseous: Minimally displaced left nasal bone fracture without overlying soft tissue swelling, of indeterminate age. No other fracture or mandibular dislocation. Orbits: Bilateral cataract extraction.  No acute traumatic finding. Sinuses: Paranasal sinuses and mastoid air cells are clear. Soft tissues: Unremarkable. IMPRESSION: 1. No evidence of acute intracranial abnormality. 2. Age indeterminate, minimally displaced left nasal bone fracture. Electronically Signed: By: Aundra Lee M.D. On: 01/26/2024 09:43   CT Maxillofacial Wo Contrast Addendum Date: 01/26/2024 ADDENDUM REPORT: 01/26/2024 09:56 ADDENDUM: There is also an acute, minimally displaced right orbital floor fracture with a small amount of extraconal hemorrhage at the orbital floor defect but no extraocular muscle herniation. No retrobulbar hematoma. Findings discussed by telephone with Dr. Mauricia South on 01/26/2024 at 9:52 a.m. Electronically Signed   By: Aundra Lee M.D.   On: 01/26/2024 09:56   Result Date: 01/26/2024 CLINICAL DATA:  Head trauma, minor (Age >= 65y); Facial trauma, blunt. Fall. Headache, epistaxis, and right eye swelling. EXAM: CT HEAD WITHOUT CONTRAST CT MAXILLOFACIAL WITHOUT CONTRAST TECHNIQUE: Multidetector CT imaging of the head and maxillofacial structures were performed using the standard protocol without intravenous contrast. Multiplanar CT image reconstructions of the maxillofacial structures were also generated. RADIATION DOSE REDUCTION: This exam was performed according to  the departmental dose-optimization program which includes automated exposure control, adjustment of the mA and/or kV according to patient size and/or use of iterative reconstruction technique. COMPARISON:  Head CT 10/23/2023 and MRI 10/29/2022 FINDINGS: CT HEAD FINDINGS Brain: There is no evidence of an acute infarct, intracranial hemorrhage, mass, midline shift, or extra-axial fluid collection. Cerebral volume is normal. The ventricles are normal in size. Periventricular white matter hypodensities are unchanged and nonspecific but compatible with mild chronic small vessel ischemic disease. Vascular: No hyperdense vessel. Skull: No acute fracture or suspicious lesion. Other: None. CT MAXILLOFACIAL FINDINGS  Osseous: Minimally displaced left nasal bone fracture without overlying soft tissue swelling, of indeterminate age. No other fracture or mandibular dislocation. Orbits: Bilateral cataract extraction.  No acute traumatic finding. Sinuses: Paranasal sinuses and mastoid air cells are clear. Soft tissues: Unremarkable. IMPRESSION: 1. No evidence of acute intracranial abnormality. 2. Age indeterminate, minimally displaced left nasal bone fracture. Electronically Signed: By: Aundra Lee M.D. On: 01/26/2024 09:43    ED Clinical Impression  1. Closed fracture of right orbital floor, initial encounter (HCC)   2. Facial injury, initial encounter   3. Injury of head, initial encounter   4. Contusion of face, initial encounter   5. Closed fracture of nasal bone, initial encounter      ED Assessment/Plan     Patient presents with headache and facial injury post unwitnessed fall this morning.  She denies preceding chest pain, palpitations, shortness of breath, syncope causing the fall loss of consciousness or amnesia.  She denies being on the ground for prolonged period of time.  No chest pain, shortness of breath.  Doubt cardiac event, syncope, CVA, seizure causing fall.  She has peripheral neuropathy in all  extremities and has a history of frequent falls.  Her neurologic exam is completely normal, GCS 15.  This occurred 4.5 hours PTA.    No evidence of ocular injury, chest, abdomen or extremity injury. Will get a noncontrast head CT and maxillofacial scans to evaluate for intracranial injury, orbital rim injury.  Reviewed imaging independently and discussed with radiology.  Small minimally displaced orbital floor fracture with some blood.  No apparent entrapment of the extraocular muscles.  Small age-indeterminate nasal bone fracture.  No acute intracranial pathology. See radiology report for full details.  discussed with Dr. Merrell Abate, ophthalmology on-call.  Recommends oral abx and steroids-agrees with Augmentin for 7 days, 40 mg of prednisone for 5 days, follow-up with [facility] in several days for visual exam.  Apply intermittent cold therapy over injury site for the first 48 hours to reduce swelling, sleep with head of bed elevated, avoid nose blowing and sniffing.  Tylenol  1000 mg 3-4 times a day as needed for pain.  Discussed imaging, MDM, treatment plan, and plan for follow-up with patient Discussed sn/sx that should prompt return to the ED. patient agrees with plan.   Spent 45 minutes in the care of this patient.  Meds ordered this encounter  Medications   acetaminophen  (TYLENOL ) tablet 975 mg      *This clinic note was created using Scientist, clinical (histocompatibility and immunogenetics). Therefore, there may be occasional mistakes despite careful proofreading. ?    Ethlyn Herd, MD 01/26/24 1327

## 2024-01-26 NOTE — ED Notes (Addendum)
 Called HTA to initiate PA for CT Head & Maxillofacial w/o contrast. S/w [place] [place] & stated no PA was needed.   Ref #:[id number]

## 2024-02-03 ENCOUNTER — Ambulatory Visit
Admission: RE | Admit: 2024-02-03 | Discharge: 2024-02-03 | Disposition: A | Discharge status: Home / Self Care | Source: Ambulatory Visit | Attending: Student in an Organized Health Care Education/Training Program | Admitting: Student in an Organized Health Care Education/Training Program

## 2024-02-03 ENCOUNTER — Ambulatory Visit
Attending: Student in an Organized Health Care Education/Training Program | Admitting: Student in an Organized Health Care Education/Training Program

## 2024-02-03 ENCOUNTER — Encounter: Payer: Self-pay | Admitting: Student in an Organized Health Care Education/Training Program

## 2024-02-03 VITALS — BP 142/82 | HR 83 | Temp 97.3°F | Resp 21 | Ht 69.0 in | Wt 164.0 lb

## 2024-02-03 DIAGNOSIS — M47816 Spondylosis without myelopathy or radiculopathy, lumbar region: Secondary | ICD-10-CM

## 2024-02-03 DIAGNOSIS — G894 Chronic pain syndrome: Secondary | ICD-10-CM

## 2024-02-03 MED ORDER — ROPIVACAINE HCL 2 MG/ML IJ SOLN
18.0000 mL | Freq: Once | INTRAMUSCULAR | Status: AC
  Administered 2024-02-03: 18 mL via PERINEURAL
  Filled 2024-02-03: qty 20

## 2024-02-03 MED ORDER — DEXAMETHASONE SODIUM PHOSPHATE 10 MG/ML IJ SOLN
20.0000 mg | Freq: Once | INTRAMUSCULAR | Status: AC
  Administered 2024-02-03: 20 mg
  Filled 2024-02-03: qty 2

## 2024-02-03 MED ORDER — LIDOCAINE HCL 2 % IJ SOLN
20.0000 mL | Freq: Once | INTRAMUSCULAR | Status: AC
  Administered 2024-02-03: 400 mg
  Filled 2024-02-03: qty 20

## 2024-02-03 NOTE — Patient Instructions (Signed)

## 2024-02-03 NOTE — Progress Notes (Signed)
 PROVIDER NOTE: Interpretation of information contained herein should be left to medically-trained personnel. Specific patient instructions are provided elsewhere under "Patient Instructions" section of medical record. This document was created in part using STT-dictation technology, any transcriptional errors that may result from this process are unintentional.  Patient: Sarah [name] Type: Established DOB: [birth year] MRN: [id number] PCP: Alexander Anes, MD  Service: Procedure DOS: 02/03/2024 Setting: Ambulatory Location: Ambulatory outpatient facility Delivery: Face-to-face Provider: Cephus Collin, MD Specialty: Interventional Pain Management Specialty designation: 09 Location: Outpatient facility Ref. Prov.: Alexander Anes, MD       Interventional Therapy   Procedure: Lumbar Facet, Medial Branch Radiofrequency Ablation (RFA) #2  Laterality: Bilateral (-50)  Level: L3, L4, and L5 Medial Branch Level(s). These levels will denervate the L3-4 and L4-5 lumbar facet joints.  Imaging: Fluoroscopy-guided         Anesthesia: Local anesthesia (1-2% Lidocaine ) DOS: 02/03/2024  Performed by: Cephus Collin, MD  Purpose: Therapeutic/Palliative Indications: Low back pain severe enough to impact quality of life or function. Indications: 1. Lumbar facet arthropathy (L4/5, L5/S1)   2. Spondylosis of lumbar joint   3. Chronic pain syndrome    Sarah [name] has been dealing with the above chronic pain for longer than three months and has either failed to respond, was unable to tolerate, or simply did not get enough benefit from other more conservative therapies including, but not limited to: 1. Over-the-counter medications 2. Anti-inflammatory medications 3. Muscle relaxants 4. Membrane stabilizers 5. Opioids 6. Physical therapy and/or chiropractic manipulation 7. Modalities (Heat, ice, etc.) 8. Invasive techniques such as nerve blocks. Sarah [name] has attained more than 50% relief of the  pain from a series of diagnostic injections conducted in separate occasions.  Pain Score: Pre-procedure: 8 /10 Post-procedure: 7/10     Position / Prep / Materials:  Position: Prone  Prep solution: DuraPrep (Iodine Povacrylex [0.7% available iodine] and Isopropyl Alcohol, 74% w/w) Prep Area: Entire Lumbosacral Region (Lower back from mid-thoracic region to end of tailbone and from flank to flank.) Materials:  Tray: RFA (Radiofrequency) tray Needle(s):  Type: RFA (Teflon-coated radiofrequency ablation needles) Gauge (G): 22  Length: Regular (10cm) Qty: 6      H&P (Pre-op Assessment):  Sarah [name] is a 89 y.o. (year old), female patient, seen today for interventional treatment. She  has a past surgical history that includes Abdominal hysterectomy; Breast biopsy (Bilateral); Breast excisional biopsy (Right); and Knee arthroscopy w/ lateral retinacular repair. Sarah [name] has a current medication list which includes the following prescription(s): acetaminophen , alendronate, amoxicillin -clavulanate, apoaequorin, beta carotene w/minerals, donepezil, dorzolamide , duloxetine , estradiol , fexofenadine, levothyroxine , lovastatin, lumigan, melatonin, nortriptyline, omeprazole , polyethylene glycol powder, pregabalin , and probiotic (lactobacillus). Her primarily concern today is the Back Pain (lower)  Initial Vital Signs:  Pulse/HCG Rate: 83ECG Heart Rate: 79 Temp: (!) 97.3 F (36.3 C) Resp: 16 BP: 121/79 SpO2: 98 %  BMI: Estimated body mass index is 24.22 kg/m as calculated from the following:   Height as of this encounter: 5\' 9"  (1.753 m).   Weight as of this encounter: 164 lb (74.4 kg).  Risk Assessment: Allergies: Reviewed. She is allergic to cholecalciferol, macrobid  [nitrofurantoin ], codeine, hydrocodone, oxycodone , peanut oil, peanut-containing drug products, penicillins, and tramadol.  Allergy Precautions: None required Coagulopathies: Reviewed. None identified.  Blood-thinner  therapy: None at this time Active Infection(s): Reviewed. None identified. Sarah [name] is afebrile  Site Confirmation: Sarah [name] was asked to confirm the procedure and laterality before marking the site Procedure checklist: Completed Consent: Before the  procedure and under the influence of no sedative(s), amnesic(s), or anxiolytics, the patient was informed of the treatment options, risks and possible complications. To fulfill our ethical and legal obligations, as recommended by the American Medical Association's Code of Ethics, I have informed the patient of my clinical impression; the nature and purpose of the treatment or procedure; the risks, benefits, and possible complications of the intervention; the alternatives, including doing nothing; the risk(s) and benefit(s) of the alternative treatment(s) or procedure(s); and the risk(s) and benefit(s) of doing nothing. The patient was provided information about the general risks and possible complications associated with the procedure. These may include, but are not limited to: failure to achieve desired goals, infection, bleeding, organ or nerve damage, allergic reactions, paralysis, and death. In addition, the patient was informed of those risks and complications associated to Spine-related procedures, such as failure to decrease pain; infection (i.e.: Meningitis, epidural or intraspinal abscess); bleeding (i.e.: epidural hematoma, subarachnoid hemorrhage, or any other type of intraspinal or peri-dural bleeding); organ or nerve damage (i.e.: Any type of peripheral nerve, nerve root, or spinal cord injury) with subsequent damage to sensory, motor, and/or autonomic systems, resulting in permanent pain, numbness, and/or weakness of one or several areas of the body; allergic reactions; (i.e.: anaphylactic reaction); and/or death. Furthermore, the patient was informed of those risks and complications associated with the medications. These include, but are not  limited to: allergic reactions (i.e.: anaphylactic or anaphylactoid reaction(s)); adrenal axis suppression; blood sugar elevation that in diabetics may result in ketoacidosis or comma; water retention that in patients with history of congestive heart failure may result in shortness of breath, pulmonary edema, and decompensation with resultant heart failure; weight gain; swelling or edema; medication-induced neural toxicity; particulate matter embolism and blood vessel occlusion with resultant organ, and/or nervous system infarction; and/or aseptic necrosis of one or more joints. Finally, the patient was informed that Medicine is not an exact science; therefore, there is also the possibility of unforeseen or unpredictable risks and/or possible complications that may result in a catastrophic outcome. The patient indicated having understood very clearly. We have given the patient no guarantees and we have made no promises. Enough time was given to the patient to ask questions, all of which were answered to the patient's satisfaction. Ms. Dingess has indicated that she wanted to continue with the procedure. Attestation: I, the ordering provider, attest that I have discussed with the patient the benefits, risks, side-effects, alternatives, likelihood of achieving goals, and potential problems during recovery for the procedure that I have provided informed consent. Date  Time: 02/03/2024  8:28 AM   Pre-Procedure Preparation:  Monitoring: As per clinic protocol. Respiration, ETCO2, SpO2, BP, heart rate and rhythm monitor placed and checked for adequate function Safety Precautions: Patient was assessed for positional comfort and pressure points before starting the procedure. Time-out: I initiated and conducted the "Time-out" before starting the procedure, as per protocol. The patient was asked to participate by confirming the accuracy of the "Time Out" information. Verification of the correct person, site, and  procedure were performed and confirmed by me, the nursing staff, and the patient. "Time-out" conducted as per Joint Commission's Universal Protocol (UP.01.01.01). Time: 0931 Start Time: 0931 hrs.  Description of Procedure:          Laterality: See above. Levels:  See above. Safety Precautions: Aspiration looking for blood return was conducted prior to all injections. At no point did we inject any substances, as a needle was being advanced. Before injecting,  the patient was told to immediately notify me if she was experiencing any new onset of "ringing in the ears, or metallic taste in the mouth". No attempts were made at seeking any paresthesias. Safe injection practices and needle disposal techniques used. Medications properly checked for expiration dates. SDV (single dose vial) medications used. After the completion of the procedure, all disposable equipment used was discarded in the proper designated medical waste containers. Local Anesthesia: Protocol guidelines were followed. The patient was positioned over the fluoroscopy table. The area was prepped in the usual manner. The time-out was completed. The target area was identified using fluoroscopy. A 12-in long, straight, sterile hemostat was used with fluoroscopic guidance to locate the targets for each level blocked. Once located, the skin was marked with an approved surgical skin marker. Once all sites were marked, the skin (epidermis, dermis, and hypodermis), as well as deeper tissues (fat, connective tissue and muscle) were infiltrated with a small amount of a short-acting local anesthetic, loaded on a 10cc syringe with a 25G, 1.5-in  Needle. An appropriate amount of time was allowed for local anesthetics to take effect before proceeding to the next step. Technical description of process:  Radiofrequency Ablation (RFA) L3 Medial Branch Nerve RFA: The target area for the L3 medial branch is at the junction of the postero-lateral aspect of the  superior articular process and the superior, posterior, and medial edge of the transverse process of L4. Under fluoroscopic guidance, a Radiofrequency needle was inserted until contact was made with os over the superior postero-lateral aspect of the pedicular shadow (target area). Sensory and motor testing was conducted to properly adjust the position of the needle. Once satisfactory placement of the needle was achieved, the numbing solution was slowly injected after negative aspiration for blood. 2.0 mL of the nerve block solution was injected without difficulty or complication. After waiting for at least 3 minutes, the ablation was performed. Once completed, the needle was removed intact. L4 Medial Branch Nerve RFA: The target area for the L4 medial branch is at the junction of the postero-lateral aspect of the superior articular process and the superior, posterior, and medial edge of the transverse process of L5. Under fluoroscopic guidance, a Radiofrequency needle was inserted until contact was made with os over the superior postero-lateral aspect of the pedicular shadow (target area). Sensory and motor testing was conducted to properly adjust the position of the needle. Once satisfactory placement of the needle was achieved, the numbing solution was slowly injected after negative aspiration for blood. 2.0 mL of the nerve block solution was injected without difficulty or complication. After waiting for at least 3 minutes, the ablation was performed. Once completed, the needle was removed intact. L5 Medial Branch Nerve RFA: The target area for the L5 medial branch is at the junction of the postero-lateral aspect of the superior articular process of S1 and the superior, posterior, and medial edge of the sacral ala. Under fluoroscopic guidance, a Radiofrequency needle was inserted until contact was made with os over the superior postero-lateral aspect of the pedicular shadow (target area). Sensory and motor  testing was conducted to properly adjust the position of the needle. Once satisfactory placement of the needle was achieved, the numbing solution was slowly injected after negative aspiration for blood. 2.0 mL of the nerve block solution was injected without difficulty or complication. After waiting for at least 3 minutes, the ablation was performed. Once completed, the needle was removed intact.  12 cc solution made of 10  cc of 0.2% ropivacaine , 2 cc of Decadron  10 mg/cc. 2cc injected at each level above after sensorimotor testing, prior to lesioning   Radiofrequency lesioning (ablation):  Radiofrequency Generator: Medtronic AccurianTM AG 1000 RF Generator Sensory Stimulation Parameters: 50 Hz was used to locate & identify the nerve, making sure that the needle was positioned such that there was no sensory stimulation below 0.3 V or above 0.7 V. Motor Stimulation Parameters: 2 Hz was used to evaluate the motor component. Care was taken not to lesion any nerves that demonstrated motor stimulation of the lower extremities at an output of less than 2.5 times that of the sensory threshold, or a maximum of 2.0 V. Lesioning Technique Parameters: Standard Radiofrequency settings. (Not bipolar or pulsed.) Temperature Settings: 80 degrees C Lesioning time: 60 seconds Stationary intra-operative compliance: Compliant  Once the entire procedure was completed, the treated area was cleaned, making sure to leave some of the prepping solution back to take advantage of its long term bactericidal properties.    Illustration of the posterior view of the lumbar spine and the posterior neural structures. Laminae of L2 through S1 are labeled. DPRL5, dorsal primary ramus of L5; DPRS1, dorsal primary ramus of S1; DPR3, dorsal primary ramus of L3; FJ, facet (zygapophyseal) joint L3-L4; I, inferior articular process of L4; LB1, lateral branch of dorsal primary ramus of L1; IAB, inferior articular branches from L3 medial  branch (supplies L4-L5 facet joint); IBP, intermediate branch plexus; MB3, medial branch of dorsal primary ramus of L3; NR3, third lumbar nerve root; S, superior articular process of L5; SAB, superior articular branches from L4 (supplies L4-5 facet joint also); TP3, transverse process of L3.  Facet Joint Innervation (* possible contribution)  L1-2 T12, L1 (L2*)  Medial Branch  L2-3 L1, L2 (L3*)         "          "  L3-4 L2, L3 (L4*)         "          "  L4-5 L3, L4 (L5*)         "          "  L5-S1 L4, L5, S1          "          "    Vitals:   02/03/24 0940 02/03/24 0945 02/03/24 0950 02/03/24 0954  BP: 135/85 136/89 (!) 149/94 (!) 142/82  Pulse:      Resp: 20 (!) 21 (!) 22 (!) 21  Temp:      SpO2: 95% 94% 94% 96%  Weight:      Height:        Start Time: 0931 hrs. End Time: 0953 hrs.  Imaging Guidance (Spinal):          Type of Imaging Technique: Fluoroscopy Guidance (Spinal) Indication(s): Assistance in needle guidance and placement for procedures requiring needle placement in or near specific anatomical locations not easily accessible without such assistance. Exposure Time: Please see nurses notes. Contrast: None used. Fluoroscopic Guidance: I was personally present during the use of fluoroscopy. "Tunnel Vision Technique" used to obtain the best possible view of the target area. Parallax error corrected before commencing the procedure. "Direction-depth-direction" technique used to introduce the needle under continuous pulsed fluoroscopy. Once target was reached, antero-posterior, oblique, and lateral fluoroscopic projection used confirm needle placement in all planes. Images permanently stored in EMR. Interpretation: No contrast injected. I personally interpreted the imaging intraoperatively. Adequate needle  placement confirmed in multiple planes. Permanent images saved into the patient's record.  Antibiotic Prophylaxis:   Anti-infectives (From admission, onward)    None       Indication(s): None identified  Post-operative Assessment:  Post-procedure Vital Signs:  Pulse/HCG Rate: 8381 Temp: (!) 97.3 F (36.3 C) Resp: (!) 21 BP: (!) 142/82 SpO2: 96 %  EBL: None  Complications: No immediate post-treatment complications observed by team, or reported by patient.  Note: The patient tolerated the entire procedure well. A repeat set of vitals were taken after the procedure and the patient was kept under observation following institutional policy, for this type of procedure. Post-procedural neurological assessment was performed, showing return to baseline, prior to discharge. The patient was provided with post-procedure discharge instructions, including a section on how to identify potential problems. Should any problems arise concerning this procedure, the patient was given instructions to immediately contact us , at any time, without hesitation. In any case, we plan to contact the patient by telephone for a follow-up status report regarding this interventional procedure.  Comments:  No additional relevant information.  Plan of Care (POC)  Orders:  Orders Placed This Encounter  Procedures   DG PAIN CL[facility] C-ARM 1-60 MIN NO REPORT    Intraoperative interpretation by procedural physician at [facility].    Standing Status:   Standing    Number of Occurrences:   1    Reason for exam::   Assistance in needle guidance and placement for procedures requiring needle placement in or near specific anatomical locations not easily accessible without such assistance.    Medications ordered for procedure: Meds ordered this encounter  Medications   lidocaine  (XYLOCAINE ) 2 % (with pres) injection 400 mg   ropivacaine  (PF) 2 mg/mL (0.2%) (NAROPIN ) injection 18 mL   dexamethasone  (DECADRON ) injection 20 mg   Medications administered: We administered lidocaine , ropivacaine  (PF) 2 mg/mL (0.2%), and dexamethasone .  See the medical record for exact dosing, route,  and time of administration.  Follow-up plan:   Return in about 6 weeks (around 03/16/2024), or PPE F2F.       Finds benefit with bilateral sacroiliac joint injection, previous one 03/14/2019 and then second 1 05/25/2019.  Also finds lumbar epidural steroid injections for when she has sciatic flare, consider repeating.  Previous one performed on 10/13/2018 at L2-L3.  Status post bilateral C4, C5, C6, C7 cervical facet medial branch nerve block on 03/05/2020, 05/21/20 for cervical facet arthropathy. Bilateral greater occipital nerve block 08/13/2020 helped significantly, repeat as needed.             Recent Visits Date Type Provider Dept  01/19/24 Office Visit Cephus Collin, MD [facility]  Showing recent visits within past 90 days and meeting all other requirements Today's Visits Date Type Provider Dept  02/03/24 Procedure visit Cephus Collin, MD [facility]  Showing today's visits and meeting all other requirements Future Appointments Date Type Provider Dept  03/17/24 Appointment Cephus Collin, MD [facility]  Showing future appointments within next 90 days and meeting all other requirements  Disposition: Discharge home  Discharge (Date  Time): 02/03/2024; 1001 hrs.   Primary Care Physician: Alexander Anes, MD Location: [facility] Note by: Cephus Collin, MD (TTS technology used. I apologize for any typographical errors that were not detected and corrected.) Date: 02/03/2024; Time: 10:40 AM  Disclaimer:  Medicine is not an Visual merchandiser. The only guarantee in medicine is that nothing is guaranteed. It is important  to note that the decision to proceed with this intervention was based on the information collected from the patient. The Data and conclusions were drawn from the patient's questionnaire, the interview, and the physical examination. Because the information was provided in large part by the patient, it cannot be guaranteed  that it has not been purposely or unconsciously manipulated. Every effort has been made to obtain as much relevant data as possible for this evaluation. It is important to note that the conclusions that lead to this procedure are derived in large part from the available data. Always take into account that the treatment will also be dependent on availability of resources and existing treatment guidelines, considered by other Pain Management Practitioners as being common knowledge and practice, at the time of the intervention. For Medico-Legal purposes, it is also important to point out that variation in procedural techniques and pharmacological choices are the acceptable norm. The indications, contraindications, technique, and results of the above procedure should only be interpreted and judged by a Board-Certified Interventional Pain Specialist with extensive familiarity and expertise in the same exact procedure and technique.

## 2024-02-03 NOTE — Progress Notes (Signed)
 Safety precautions to be maintained throughout the outpatient stay will include: orient to surroundings, keep bed in low position, maintain call bell within reach at all times, provide assistance with transfer out of bed and ambulation.

## 2024-02-04 ENCOUNTER — Telehealth: Payer: Self-pay

## 2024-02-04 NOTE — Telephone Encounter (Signed)
 No issues post-procedure.

## 2024-02-09 ENCOUNTER — Ambulatory Visit
Admission: RE | Admit: 2024-02-09 | Discharge: 2024-02-09 | Disposition: A | Discharge status: Home / Self Care | Source: Ambulatory Visit | Attending: Family Medicine | Admitting: Family Medicine

## 2024-02-09 DIAGNOSIS — Z780 Asymptomatic menopausal state: Secondary | ICD-10-CM | POA: Insufficient documentation

## 2024-02-09 DIAGNOSIS — R109 Unspecified abdominal pain: Secondary | ICD-10-CM | POA: Insufficient documentation

## 2024-02-16 ENCOUNTER — Encounter: Payer: Self-pay | Admitting: *Deleted

## 2024-03-08 ENCOUNTER — Other Ambulatory Visit: Payer: Self-pay | Admitting: Family Medicine

## 2024-03-08 DIAGNOSIS — Z1231 Encounter for screening mammogram for malignant neoplasm of breast: Secondary | ICD-10-CM

## 2024-03-11 ENCOUNTER — Ambulatory Visit: Admitting: Anesthesiology

## 2024-03-11 ENCOUNTER — Ambulatory Visit
Admission: RE | Admit: 2024-03-11 | Discharge: 2024-03-11 | Disposition: A | Discharge status: Home / Self Care | Attending: Gastroenterology | Admitting: Gastroenterology

## 2024-03-11 ENCOUNTER — Encounter: Payer: Self-pay | Admitting: Gastroenterology

## 2024-03-11 ENCOUNTER — Encounter
Admission: RE | Disposition: A | Discharge status: Home / Self Care | Payer: Self-pay | Source: Home / Self Care | Attending: Gastroenterology

## 2024-03-11 DIAGNOSIS — Z79899 Other long term (current) drug therapy: Secondary | ICD-10-CM | POA: Insufficient documentation

## 2024-03-11 DIAGNOSIS — E782 Mixed hyperlipidemia: Secondary | ICD-10-CM | POA: Diagnosis not present

## 2024-03-11 DIAGNOSIS — E039 Hypothyroidism, unspecified: Secondary | ICD-10-CM | POA: Diagnosis not present

## 2024-03-11 DIAGNOSIS — F32A Depression, unspecified: Secondary | ICD-10-CM | POA: Diagnosis not present

## 2024-03-11 DIAGNOSIS — R1310 Dysphagia, unspecified: Secondary | ICD-10-CM | POA: Insufficient documentation

## 2024-03-11 DIAGNOSIS — Q399 Congenital malformation of esophagus, unspecified: Secondary | ICD-10-CM | POA: Insufficient documentation

## 2024-03-11 DIAGNOSIS — G4730 Sleep apnea, unspecified: Secondary | ICD-10-CM | POA: Diagnosis not present

## 2024-03-11 DIAGNOSIS — N1831 Chronic kidney disease, stage 3a: Secondary | ICD-10-CM | POA: Diagnosis not present

## 2024-03-11 DIAGNOSIS — I129 Hypertensive chronic kidney disease with stage 1 through stage 4 chronic kidney disease, or unspecified chronic kidney disease: Secondary | ICD-10-CM | POA: Insufficient documentation

## 2024-03-11 DIAGNOSIS — M1990 Unspecified osteoarthritis, unspecified site: Secondary | ICD-10-CM | POA: Insufficient documentation

## 2024-03-11 HISTORY — DX: Occipital neuralgia: M54.81

## 2024-03-11 HISTORY — DX: Mixed hyperlipidemia: E78.2

## 2024-03-11 HISTORY — DX: Shortness of breath: R06.02

## 2024-03-11 HISTORY — DX: Nondisplaced fracture of lateral end of right clavicle, initial encounter for closed fracture: S42.034A

## 2024-03-11 HISTORY — PX: ESOPHAGEAL DILATION: SHX303

## 2024-03-11 HISTORY — DX: Other intervertebral disc displacement, lumbar region: M51.26

## 2024-03-11 HISTORY — DX: Dysphagia, unspecified: R13.10

## 2024-03-11 HISTORY — DX: Anesthesia of skin: R20.0

## 2024-03-11 HISTORY — DX: Nonrheumatic mitral (valve) insufficiency: I34.0

## 2024-03-11 HISTORY — DX: Sleep apnea, unspecified: G47.30

## 2024-03-11 HISTORY — DX: Rheumatic tricuspid insufficiency: I07.1

## 2024-03-11 HISTORY — DX: Spinal stenosis, lumbar region without neurogenic claudication: M48.061

## 2024-03-11 HISTORY — DX: Squamous cell carcinoma of skin of left lower eyelid, including canthus: C44.1292

## 2024-03-11 HISTORY — DX: Allergic rhinitis, unspecified: J30.9

## 2024-03-11 HISTORY — DX: Prediabetes: R73.03

## 2024-03-11 HISTORY — DX: Chronic kidney disease, stage 3a: N18.31

## 2024-03-11 HISTORY — DX: Other amnesia: R41.3

## 2024-03-11 HISTORY — DX: Sacroiliitis, not elsewhere classified: M46.1

## 2024-03-11 HISTORY — PX: ESOPHAGOGASTRODUODENOSCOPY: SHX5428

## 2024-03-11 HISTORY — DX: Other lack of coordination: R27.8

## 2024-03-11 HISTORY — DX: Primary osteoarthritis, unspecified site: M19.91

## 2024-03-11 HISTORY — DX: Headache, unspecified: R51.9

## 2024-03-11 SURGERY — EGD (ESOPHAGOGASTRODUODENOSCOPY)
Anesthesia: General

## 2024-03-11 MED ORDER — LIDOCAINE HCL (PF) 2 % IJ SOLN
INTRAMUSCULAR | Status: AC
  Filled 2024-03-11: qty 5

## 2024-03-11 MED ORDER — LIDOCAINE HCL (CARDIAC) PF 100 MG/5ML IV SOSY
PREFILLED_SYRINGE | INTRAVENOUS | Status: DC | PRN
  Administered 2024-03-11: 70 mg via INTRAVENOUS

## 2024-03-11 MED ORDER — PROPOFOL 10 MG/ML IV BOLUS
INTRAVENOUS | Status: DC | PRN
  Administered 2024-03-11: 50 mg via INTRAVENOUS
  Administered 2024-03-11: 40 mg via INTRAVENOUS
  Administered 2024-03-11: 10 mg via INTRAVENOUS

## 2024-03-11 MED ORDER — SODIUM CHLORIDE 0.9 % IV SOLN
INTRAVENOUS | Status: DC
  Administered 2024-03-11: 20 mL/h via INTRAVENOUS

## 2024-03-11 MED ORDER — PROPOFOL 10 MG/ML IV BOLUS
INTRAVENOUS | Status: AC
  Filled 2024-03-11: qty 40

## 2024-03-11 NOTE — Op Note (Signed)
 [facility] Gastroenterology Patient Name: Sarah [name] Procedure Date: 03/11/2024 11:07 AM MRN: [id number] Account #: [id number] Date of Birth: [birth year] Admit Type: Outpatient Age: 89 Room: [name] ENDO ROOM 3 Gender: Female Note Status: Finalized Instrument Name: Upper Endoscope [id number] Procedure:             Upper GI endoscopy Indications:           Dysphagia Providers:             Leida Puna MD, MD Referring MD:          Leida Puna MD, MD (Referring MD), Sionne A.                         Caretha Chapel MD, MD (Referring MD) Medicines:             Monitored Anesthesia Care Complications:         No immediate complications. Estimated blood loss:                         Minimal. Procedure:             Pre-Anesthesia Assessment:                        - Prior to the procedure, a History and Physical was                         performed, and patient medications and allergies were                         reviewed. The patient is competent. The risks and                         benefits of the procedure and the sedation options and                         risks were discussed with the patient. All questions                         were answered and informed consent was obtained.                         Patient identification and proposed procedure were                         verified by the physician, the nurse, the                         anesthesiologist, the anesthetist and the technician                         in the endoscopy suite. Mental Status Examination:                         alert and oriented. Airway Examination: normal                         oropharyngeal airway and neck mobility. Respiratory  Examination: clear to auscultation. CV Examination:                         normal. Prophylactic Antibiotics: The patient does not                         require prophylactic antibiotics. Prior                          Anticoagulants: The patient has taken no anticoagulant                         or antiplatelet agents. ASA Grade Assessment: III - A                         patient with severe systemic disease. After reviewing                         the risks and benefits, the patient was deemed in                         satisfactory condition to undergo the procedure. The                         anesthesia plan was to use monitored anesthesia care                         (MAC). Immediately prior to administration of                         medications, the patient was re-assessed for adequacy                         to receive sedatives. The heart rate, respiratory                         rate, oxygen saturations, blood pressure, adequacy of                         pulmonary ventilation, and response to care were                         monitored throughout the procedure. The physical                         status of the patient was re-assessed after the                         procedure.                        After obtaining informed consent, the endoscope was                         passed under direct vision. Throughout the procedure,                         the patient's blood pressure, pulse, and oxygen  saturations were monitored continuously. The Endoscope                         was introduced through the mouth, and advanced to the                         second part of duodenum. The upper GI endoscopy was                         accomplished without difficulty. The patient tolerated                         the procedure well. Findings:      The distal esophagus was mildly tortuous.      The exam of the esophagus was otherwise normal.      A TTS dilator was passed through the scope. Dilation with a 15-16.5-18       mm balloon dilator was performed to 18 mm in the lower third of the       esophagus. No change noted.      The entire examined stomach was normal.      The  examined duodenum was normal. Impression:            - Tortuous esophagus.                        - Normal stomach.                        - Normal examined duodenum.                        - Dilation performed in the lower third of the                         esophagus.                        - No specimens collected. Recommendation:        - Discharge patient to home.                        - Resume previous diet.                        - Continue present medications.                        - Return to referring physician as previously                         scheduled. Procedure Code(s):     --- Professional ---                        [phone], Esophagogastroduodenoscopy, flexible,                         transoral; with transendoscopic balloon dilation of                         esophagus (less than 30 mm diameter) Diagnosis Code(s):     --- Professional ---  Q39.9, Congenital malformation of esophagus,                         unspecified                        R13.10, Dysphagia, unspecified CPT copyright 2022 [employer]. All rights reserved. The codes documented in this report are preliminary and upon coder review may  be revised to meet current compliance requirements. Leida Puna MD, MD 03/11/2024 11:33:22 AM Number of Addenda: 0 Note Initiated On: 03/11/2024 11:07 AM Estimated Blood Loss:  Estimated blood loss was minimal.      [facility]

## 2024-03-11 NOTE — H&P (Signed)
 Outpatient short stay form Pre-procedure 03/11/2024  Shane Darling, MD  Primary Physician: Alexander Anes, MD  Reason for visit:  Dysphagia  History of present illness:    89 y/o lady with history of schatzki ring, HLD, and sleep apnea here for EGD for solid food dysphagia. No blood thinners. No family history of GI malignancies. No neck surgeries.    Current Facility-Administered Medications:    0.9 %  sodium chloride  infusion, , Intravenous, Continuous, Tatsuo Musial, Leanora Prophet, MD, Last Rate: 20 mL/hr at 03/11/24 1031, 20 mL/hr at 03/11/24 1031  Medications Prior to Admission  Medication Sig Dispense Refill Last Dose/Taking   acetaminophen  (TYLENOL ) 500 MG tablet Take 1,000 mg by mouth every 6 (six) hours as needed.   03/10/2024   alendronate (FOSAMAX) 70 MG tablet Take 70 mg by mouth every 7 (seven) days.   03/10/2024   amoxicillin -clavulanate (AUGMENTIN ) 875-125 MG tablet Take 1 tablet by mouth every 12 (twelve) hours. 14 tablet 0 03/10/2024   Apoaequorin (PREVAGEN PO) Take 75 mg by mouth in the morning, at noon, and at bedtime.   03/10/2024   beta carotene w/minerals (OCUVITE) tablet Take 1 tablet by mouth daily.   Past Week   donepezil (ARICEPT ODT) 10 MG disintegrating tablet Take 10 mg by mouth at bedtime.   03/10/2024   dorzolamide  (TRUSOPT ) 2 % ophthalmic solution Place 1 drop into both eyes 3 (three) times daily.   03/10/2024   DULoxetine  (CYMBALTA ) 60 MG capsule Take 60 mg by mouth daily.   03/10/2024   estradiol  (ESTRACE ) 0.1 MG/GM vaginal cream APPLY PEA SIZED AMT TO TIP OF FINGER TO URETHRA BEFORE BED. [facility] HANDS WELL AFTER. MONDAY, [facility] AND FRIDAY 42.5 g 0 03/10/2024   fexofenadine (ALLEGRA) 180 MG tablet Take 180 mg by mouth daily.   03/10/2024   levothyroxine  (SYNTHROID ) 100 MCG tablet Take 100 mcg by mouth daily.   03/10/2024   lovastatin (MEVACOR) 20 MG tablet Take 20 mg by mouth daily.   03/10/2024   LUMIGAN 0.01 % SOLN Place 1 drop into both eyes at bedtime.    03/10/2024   Melatonin 1 MG TABS Take 10 mg by mouth once.   03/10/2024   nortriptyline (PAMELOR) 10 MG capsule Take 10 mg by mouth at bedtime.   03/10/2024   omeprazole  (PRILOSEC) 40 MG capsule Take 1 capsule (40 mg total) by mouth daily. 10 capsule 0 03/10/2024   polyethylene glycol powder (GLYCOLAX/MIRALAX) 17 GM/SCOOP powder Take by mouth.   03/10/2024   pregabalin  (LYRICA ) 75 MG capsule Take 75 mg by mouth 3 (three) times daily.   03/10/2024   Probiotic, Lactobacillus, CAPS Take by mouth.   03/10/2024     Allergies  Allergen Reactions   Cholecalciferol Nausea Only    cholecalciferol   Macrobid  [Nitrofurantoin ] Shortness Of Breath   Codeine Other (See Comments)    Makes her disoriented  codeine   Hydrocodone Other (See Comments)    Patient states she was seeing things that weren't there and very disoriented.   Oxycodone  Other (See Comments)    hallucinations   Peanut Oil Other (See Comments)    Violent Migraines  peanut oil   Peanut-Containing Drug Products     Other reaction(s): Headache   Penicillins Nausea Only   Tramadol Other (See Comments)    Dizziness.  tramadol     Past Medical History:  Diagnosis Date   Allergic rhinitis    Benign essential HTN 09/21/2015   Bilateral occipital neuralgia  Cancer (HCC)    Skin CA resected from Right eyelid and both legs.   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    Physical Exam  Gen: Alert, oriented. Appears stated age.  HEENT: PERRLA. Lungs: No respiratory distress CV: RRR Abd: soft, benign, no masses Ext: No edema    Planned procedures: Proceed with EGD. The patient understands the nature of the planned procedure, indications, risks, alternatives and potential complications including but not limited to bleeding, infection, perforation, damage to internal organs and possible oversedation/side effects from anesthesia. The patient agrees and gives consent to proceed.  Please refer to procedure notes for findings, recommendations and patient disposition/instructions.     Shane Darling, MD [facility] Gastroenterology

## 2024-03-11 NOTE — Transfer of Care (Signed)
 Immediate Anesthesia Transfer of Care Note  Patient: Sarah [name]  Procedure(s) Performed: EGD (ESOPHAGOGASTRODUODENOSCOPY)  Patient Location: PACU  Anesthesia Type:MAC  Level of Consciousness: awake, alert , and oriented  Airway & Oxygen Therapy: Patient Spontanous Breathing  Post-op Assessment: Report given to RN and Post -op Vital signs reviewed and stable  Post vital signs: stable  Last Vitals:  Vitals Value Taken Time  BP 141/79 03/11/24 11:29  Temp    Pulse 67 03/11/24 11:30  Resp 13 03/11/24 11:30  SpO2 98 % 03/11/24 11:30  Vitals shown include unfiled device data.  Last Pain:  Vitals:   03/11/24 1021  TempSrc: Temporal  PainSc: 3          Complications: No notable events documented.

## 2024-03-11 NOTE — Anesthesia Preprocedure Evaluation (Signed)
 Anesthesia Evaluation  Patient identified by MRN, date of birth, ID band Patient awake    Reviewed: Allergy & Precautions, NPO status , Patient's Chart, lab work & pertinent test results  Airway Mallampati: III  TM Distance: >3 FB Neck ROM: full    Dental  (+) Partial Upper   Pulmonary neg pulmonary ROS, sleep apnea    Pulmonary exam normal breath sounds clear to auscultation       Cardiovascular Exercise Tolerance: Good hypertension, Pt. on medications negative cardio ROS Normal cardiovascular exam Rhythm:Regular Rate:Normal     Neuro/Psych Seizures -,    Depression    negative neurological ROS  negative psych ROS   GI/Hepatic negative GI ROS, Neg liver ROS,,,  Endo/Other  negative endocrine ROSHypothyroidism    Renal/GU negative Renal ROS  negative genitourinary   Musculoskeletal  (+) Arthritis ,    Abdominal   Peds negative pediatric ROS (+)  Hematology negative hematology ROS (+)   Anesthesia Other Findings Past Medical History: No date: Allergic rhinitis 09/21/2015: Benign essential HTN No date: Bilateral occipital neuralgia No date: Cancer [facility])     Comment:  Skin CA resected from Right eyelid and both legs. No date: Closed nondisplaced fracture of acromial end of right  clavicle No date: Displacement of lumbar intervertebral disc without myelopathy No date: Dysphagia No date: Headache disorder No date: Localized primary osteoarthritis No date: Memory difficulty No date: Mixed hyperlipidemia No date: Moderate mitral insufficiency No date: Moderate tricuspid insufficiency No date: Neuropathy No date: Numbness in feet No date: Pre-diabetes No date: Seizures (HCC) No date: Sensory ataxia No date: SI joint arthritis (HCC) No date: Sleep apnea No date: SOBOE (shortness of breath on exertion) No date: Spinal stenosis of lumbar region No date: Squamous cell carcinoma (SCC) of lower eyelid of left  eye No date: Stage 3a chronic kidney disease (CKD) (HCC) No date: Thyroid  disease  Past Surgical History: No date: ABDOMINAL HYSTERECTOMY No date: BREAST BIOPSY; Bilateral     Comment:  neg No date: BREAST EXCISIONAL BIOPSY; Right No date: KNEE ARTHROSCOPY W/ LATERAL RETINACULAR REPAIR  BMI    Body Mass Index: 24.94 kg/m      Reproductive/Obstetrics negative OB ROS                             Anesthesia Physical Anesthesia Plan  ASA: 3  Anesthesia Plan: General   Post-op Pain Management:    Induction:   PONV Risk Score and Plan: Propofol infusion and TIVA  Airway Management Planned: Natural Airway and Nasal Cannula  Additional Equipment:   Intra-op Plan:   Post-operative Plan:   Informed Consent: I have reviewed the patients History and Physical, chart, labs and discussed the procedure including the risks, benefits and alternatives for the proposed anesthesia with the patient or authorized representative who has indicated his/her understanding and acceptance.     Dental Advisory Given  Plan Discussed with: CRNA  Anesthesia Plan Comments:        Anesthesia Quick Evaluation

## 2024-03-11 NOTE — Interval H&P Note (Signed)
 History and Physical Interval Note:  03/11/2024 11:13 AM  Sarah [name]  has presented today for surgery, with the diagnosis of Pharygoesophageal dysphagia.  The various methods of treatment have been discussed with the patient and family. After consideration of risks, benefits and other options for treatment, the patient has consented to  Procedure(s): EGD (ESOPHAGOGASTRODUODENOSCOPY) (N/A) as a surgical intervention.  The patient's history has been reviewed, patient examined, no change in status, stable for surgery.  I have reviewed the patient's chart and labs.  Questions were answered to the patient's satisfaction.     Shane Darling  Ok to proceed with EGD

## 2024-03-11 NOTE — Anesthesia Postprocedure Evaluation (Signed)
 Anesthesia Post Note  Patient: Sarah [name]  Procedure(s) Performed: EGD (ESOPHAGOGASTRODUODENOSCOPY)  Patient location during evaluation: PACU Anesthesia Type: General Level of consciousness: awake and alert Pain management: satisfactory to patient Vital Signs Assessment: post-procedure vital signs reviewed and stable Respiratory status: spontaneous breathing Cardiovascular status: stable Anesthetic complications: no   No notable events documented.   Last Vitals:  Vitals:   03/11/24 1140 03/11/24 1150  BP: (!) 156/83 (!) 154/89  Pulse: 65 61  Resp: 13 15  Temp:    SpO2: 99% 99%    Last Pain:  Vitals:   03/11/24 1150  TempSrc:   PainSc: 0-No pain                 VAN STAVEREN,Savanah Bayles

## 2024-03-17 ENCOUNTER — Ambulatory Visit
Attending: Student in an Organized Health Care Education/Training Program | Admitting: Student in an Organized Health Care Education/Training Program

## 2024-03-17 ENCOUNTER — Encounter: Payer: Self-pay | Admitting: Student in an Organized Health Care Education/Training Program

## 2024-03-17 VITALS — BP 125/77 | HR 73 | Temp 97.4°F | Ht 68.0 in | Wt 164.0 lb

## 2024-03-17 DIAGNOSIS — G588 Other specified mononeuropathies: Secondary | ICD-10-CM | POA: Insufficient documentation

## 2024-03-17 NOTE — Progress Notes (Signed)
 Safety precautions to be maintained throughout the outpatient stay will include: orient to surroundings, keep bed in low position, maintain call bell within reach at all times, provide assistance with transfer out of bed and ambulation.

## 2024-03-17 NOTE — Progress Notes (Signed)
 PROVIDER NOTE: Interpretation of information contained herein should be left to medically-trained personnel. Specific patient instructions are provided elsewhere under Patient Instructions section of medical record. This document was created in part using AI and STT-dictation technology, any transcriptional errors that may result from this process are unintentional.  Patient: Sarah [name]  Service: E/M   PCP: Zachary Idelia LABOR, MD  DOB: 12/23/1931  DOS: 03/17/2024  Provider: Wallie Sherry, MD  MRN: [id number]  Delivery: Face-to-face  Specialty: Interventional Pain Management  Type: Established Patient  Setting: Ambulatory outpatient facility  Specialty designation: 09  Referring Prov.: Zachary Idelia LABOR, MD  Location: Outpatient office facility       History of present illness (HPI) [name], a 89 y.o. 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 [name] primary complain today is Back Pain (lower)  Pertinent problems: Ms. Schabel has Lumbar facet arthropathy; Lumbar radiculopathy; Lumbar disc herniation (L2/L3); Lumbar degenerative disc disease; Chronic pain syndrome; Neuropathy; Thyroid  nodule; and Bilateral occipital neuralgia on their pertinent problem list.  Pain Assessment: Severity of Chronic pain is reported as a 5 /10. Location: Back  /Denies. Onset: More than a month ago. Quality: Aching, Nagging, Throbbing. Timing: Constant. Modifying factor(s): sitting down and tylenol . Vitals:  height is 5' 8 (1.727 m) and weight is 164 lb (74.4 kg). Her temperature is 97.4 F (36.3 C) (abnormal). Her blood pressure is 125/77 and her pulse is 73. Her oxygen saturation is 100%.  BMI: Estimated body mass index is 24.94 kg/m as calculated from the following:   Height as of this encounter: 5' 8 (1.727 m).   Weight as of this encounter: 164 lb (74.4 kg).  Last encounter: 01/19/2024. Last procedure: 02/03/2024.  Reason for encounter:    Discussed the use of AI scribe software for clinical note transcription with the patient, who gave verbal consent to proceed.  History of Present Illness   Sarah [name] is an 89 year old female who presents with persistent lower back pain.  She experiences persistent lower back pain that has not improved following a recent lumbar MB ablation procedure. The pain is primarily located in the lower back and extends to the buttocks area, but does not radiate into the hip.  She has been taking Tylenol  for pain management, which provides some relief but is not sufficient. She has not tried a Social research officer, government or other opioid medications like oxycodone  or morphine. She has not had an SI joint injection recently.  Pain is in the lateral lower back and over right iliac crest  Her current medication regimen includes Tylenol , but she is on multiple medications for other conditions as well.  No radiation of pain into the hip and no new symptoms in the groin area.       ROS  Constitutional: Denies any fever or chills Gastrointestinal: No reported hemesis, hematochezia, vomiting, or acute GI distress Musculoskeletal: As above Neurological: No reported episodes of acute onset apraxia, aphasia, dysarthria, agnosia, amnesia, paralysis, loss of coordination, or loss of consciousness  Medication Review  Apoaequorin, DULoxetine , Probiotic (Lactobacillus), acetaminophen , alendronate, amoxicillin -clavulanate, beta carotene w/minerals, bimatoprost, donepezil, dorzolamide , estradiol , fexofenadine, levothyroxine , lovastatin, melatonin, nortriptyline, omeprazole , polyethylene glycol powder, and pregabalin   History Review  Allergy: [name] is allergic to cholecalciferol, macrobid  [nitrofurantoin ], codeine, hydrocodone, oxycodone , peanut oil, peanut-containing drug products, penicillins, and tramadol. Drug: [name]  reports no history of drug use. Alcohol:  reports no history of alcohol  use. Tobacco:  reports that  she has never smoked. She has never used smokeless tobacco. Social: [name]  reports that she has never smoked. She has never used smokeless tobacco. She reports that she does not drink alcohol and does not use drugs. Medical:  has a past medical history of Allergic rhinitis, Benign essential HTN (09/21/2015), Bilateral occipital neuralgia, Cancer (HCC), Closed nondisplaced fracture of acromial end of right clavicle, Displacement of lumbar intervertebral disc without myelopathy, Dysphagia, Headache disorder, Localized primary osteoarthritis, Memory difficulty, Mixed hyperlipidemia, Moderate mitral insufficiency, Moderate tricuspid insufficiency, Neuropathy, Numbness in feet, Pre-diabetes, Seizures (HCC), Sensory ataxia, SI joint arthritis (HCC), Sleep apnea, SOBOE (shortness of breath on exertion), Spinal stenosis of lumbar region, 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 Surgical: [name]  has a past surgical history that includes Abdominal hysterectomy; Breast biopsy (Bilateral); Breast excisional biopsy (Right); Knee arthroscopy w/ lateral retinacular repair; Esophagogastroduodenoscopy (N/A, 03/11/2024); and Esophageal dilation (03/11/2024). Family: family history includes Brain cancer in her sister; Breast cancer in her maternal aunt; Breast cancer (age of onset: 70) in her maternal aunt; Breast cancer (age of onset: 58) in her mother; Breast cancer (age of onset: 66) in her maternal uncle; Cancer in her brother and sister; Tracheal cancer in her father.  Laboratory Chemistry Profile   Renal Lab Results  Component Value Date   BUN 15 10/24/2023   CREATININE 0.73 10/24/2023   GFRAA 77 09/12/2020   GFRNONAA >60 10/24/2023    Hepatic Lab Results  Component Value Date   AST 19 11/26/2015   ALT 15 11/26/2015   ALBUMIN 4.7 11/26/2015   ALKPHOS 64 11/26/2015    Electrolytes Lab  Results  Component Value Date   NA 140 10/24/2023   K 3.8 10/24/2023   CL 105 10/24/2023   CALCIUM 9.1 10/24/2023   MG 2.3 01/20/2023    Bone No results found for: VD25OH, VD125OH2TOT, CI6874NY7, CI7874NY7, 25OHVITD1, 25OHVITD2, 25OHVITD3, TESTOFREE, TESTOSTERONE  Inflammation (CRP: Acute Phase) (ESR: Chronic Phase) No results found for: CRP, ESRSEDRATE, LATICACIDVEN       Note: Above Lab results reviewed.  Recent Imaging Review  DG BONE DENSITY (DXA) EXAM: DUAL X-RAY ABSORPTIOMETRY (DXA) FOR BONE MINERAL DENSITY 02/09/2024 10:23 am  CLINICAL DATA:  89 year old Female Postmenopausal. Screening for osteoporosis  History of fragility fracture. Patient is or has been on glucocorticoid therapy.  TECHNIQUE: An axial (e.g., hips, spine) and/or appendicular (e.g., radius) exam was performed, as appropriate, using [employer] systems engineer at [facility]. Images are obtained for bone mineral density measurement and are not obtained for diagnostic purposes. [id number]  Exclusions: Lumbar spine due to advanced degenerative changes  COMPARISON:  08/03/2013  FINDINGS: Scan quality: Good.  LEFT FEMORAL NECK:  BMD (in g/cm2): 0.761  T-score: -2.0  Z-score: 0.6  LEFT TOTAL HIP:  BMD (in g/cm2): 0.781  T-score: -1.8  Z-score: 0.7  RIGHT FEMORAL NECK:  BMD (in g/cm2): 0.893  T-score: -1.0  Z-score: 1.5  RIGHT TOTAL HIP:  BMD (in g/cm2): 0.916  T-score: -0.7  Z-score: 1.8  DUAL-FEMUR TOTAL MEAN:  Rate of change from previous exam: No significant rate of change from previous exam.  LEFT FOREARM (RADIUS 33%):  BMD (in g/cm2): 0.674  T-score: -2.3  Z-score: 1.3  Rate of change from previous exam: No significant rate of change from previous exam.  FRAX 10-YEAR PROBABILITY OF FRACTURE:  10-year fracture risk is performed using the [employer] FRAX calculator based on  patient-reported risk factors.  Major osteoporotic fracture: 26.6% Hip fracture: 9.4%  Other situations known to alter the reliability of the FRAX score should be considered when making treatment decisions, including chronic glucocorticoid use and past treatments. Further guidance on treatment can be found at the [facility]'s website [email or web address]://www.patton.com/.  IMPRESSION: Osteopenia based on BMD.  Fracture risk is increased. Increased risk is based on low BMD, FRAX calculation, and history of fragility fracture.  RECOMMENDATIONS: 1. All patients should optimize calcium and vitamin D intake.  2. Consider FDA-approved medical therapies in postmenopausal women and men aged 83 years and older, based on the following:  - A hip or vertebral (clinical or morphometric) fracture  - T-score less than or equal to -2.5 and secondary causes have been excluded.  - Low bone mass (T-score between -1.0 and -2.5) and a 10-year probability of a hip fracture greater than or equal to 3% or a 10-year probability of a major osteoporosis-related fracture greater than or equal to 20% based on the [place] -adapted WHO algorithm.  - Clinician judgment and/or patient preferences may indicate treatment for people with 10-year fracture probabilities above or below these levels  3. Patients with diagnosis of osteoporosis or at high risk for fracture should have regular bone mineral density tests. For patients eligible for Medicare, routine testing is allowed once every 2 years. The testing frequency can be increased to one year for patients who have rapidly progressing disease, those who are receiving or discontinuing medical therapy to restore bone mass, or have additional risk factors.  Electronically Signed   By: Dina  Arceo M.D.   On: 02/10/2024 10:13 Note: Reviewed        Physical Exam  General appearance: Well nourished, well developed, and well hydrated. In no apparent acute  distress Mental status: Alert, oriented x 3 (person, place, & time)       Respiratory: No evidence of acute respiratory distress Eyes: PERLA Vitals: BP 125/77   Pulse 73   Temp (!) 97.4 F (36.3 C)   Ht 5' 8 (1.727 m)   Wt 164 lb (74.4 kg)   SpO2 100%   BMI 24.94 kg/m  BMI: Estimated body mass index is 24.94 kg/m as calculated from the following:   Height as of this encounter: 5' 8 (1.727 m).   Weight as of this encounter: 164 lb (74.4 kg). Ideal: Ideal body weight: 63.9 kg (140 lb 14 oz) Adjusted ideal body weight: 68.1 kg (150 lb 2 oz)  Low back pain, lateral overlying iliac crest  Assessment   Diagnosis  1. Cluneal neuropathy      Updated Problems: No problems updated.  Plan of Care  Assessment and Plan    Chronic lower back pain   Chronic lower back pain persists without relief from previous hip injection or ablation. Pain remains localized to the lower back without radiation to the hip, with possible involvement of other areas in the lower back and buttock. Differential diagnosis includes SI joint pain and cluneal nerve involvement. Consider cluneal nerve block bilaterally and SI joint injection if buttock pain increases. Discuss Butrans patch as a future option if injections are ineffective.  Severe arthritis   Severe arthritis contributes to chronic pain, with previous treatments providing only short-lived relief. Tylenol  offers some relief but is insufficient for pain management. Continue current pain management strategies and discuss potential use of Butrans patch if injections are ineffective, due to its lower risk of confusion and falls  in the elderly.      1. Cluneal neuropathy (Primary) - CLUNEAL NERVE BLOCK; Future    No orders of the defined types were placed in this encounter.  Orders:  Orders Placed This Encounter  Procedures   CLUNEAL NERVE BLOCK    Standing Status:   Future    Expected Date:   03/31/2024    Expiration Date:   03/17/2025     Scheduling Instructions:     Side: Bilateral     Sedation: WITHOUT     Timeframe: 2 weeks    Where will this procedure be performed?:   [facility]     Finds benefit with bilateral sacroiliac joint injection, previous one 03/14/2019 and then second 1 05/25/2019.  Also finds lumbar epidural steroid injections for when she has sciatic flare, consider repeating.  Previous one performed on 10/13/2018 at L2-L3.  Status post bilateral C4, C5, C6, C7 cervical facet medial branch nerve block on 03/05/2020, 05/21/20 for cervical facet arthropathy. Bilateral greater occipital nerve block 08/13/2020 helped significantly, repeat as needed.             Return in about 11 days (around 03/28/2024) for B/L cluneal NB.    Recent Visits Date Type Provider Dept  02/03/24 Procedure visit Marcelino Nurse, MD [facility]  01/19/24 Office Visit Marcelino Nurse, MD [facility]  Showing recent visits within past 90 days and meeting all other requirements Today's Visits Date Type Provider Dept  03/17/24 Office Visit Marcelino Nurse, MD [facility]  Showing today's visits and meeting all other requirements Future Appointments Date Type Provider Dept  03/28/24 Appointment Marcelino Nurse, MD [facility]  Showing future appointments within next 90 days and meeting all other requirements  I discussed the assessment and treatment plan with the patient. The patient was provided an opportunity to ask questions and all were answered. The patient agreed with the plan and demonstrated an understanding of the instructions.  Patient advised to call back or seek an in-person evaluation if the symptoms or condition worsens.  Duration of encounter: .  Total time on encounter, as per AMA guidelines included both the face-to-face and non-face-to-face time personally spent by the physician and/or other qualified health care professional(s) on the day of the encounter (includes time in  activities that require the physician or other qualified health care professional and does not include time in activities normally performed by clinical staff). Physician's time may include the following activities when performed: Preparing to see the patient (e.g., pre-charting review of records, searching for previously ordered imaging, lab work, and nerve conduction tests) Review of prior analgesic pharmacotherapies. Reviewing PMP Interpreting ordered tests (e.g., lab work, imaging, nerve conduction tests) Performing post-procedure evaluations, including interpretation of diagnostic procedures Obtaining and/or reviewing separately obtained history Performing a medically appropriate examination and/or evaluation Counseling and educating the patient/family/caregiver Ordering medications, tests, or procedures Referring and communicating with other health care professionals (when not separately reported) Documenting clinical information in the electronic or other health record Independently interpreting results (not separately reported) and communicating results to the patient/ family/caregiver Care coordination (not separately reported)  Note by: Nurse Marcelino, MD (TTS and AI technology used. I apologize for any typographical errors that were not detected and corrected.) Date: 03/17/2024; Time: 9:47 AM

## 2024-03-17 NOTE — Patient Instructions (Signed)
 GENERAL RISKS AND COMPLICATIONS  What are the risk, side effects and possible complications? Generally speaking, most procedures are safe.  However, with any procedure there are risks, side effects, and the possibility of complications.  The risks and complications are dependent upon the sites that are lesioned, or the type of nerve block to be performed.  The closer the procedure is to the spine, the more serious the risks are.  Great care is taken when placing the radio frequency needles, block needles or lesioning probes, but sometimes complications can occur. Infection: Any time there is an injection through the skin, there is a risk of infection.  This is why sterile conditions are used for these blocks.  There are four possible types of infection. Localized skin infection. Central Nervous System Infection-This can be in the form of Meningitis, which can be deadly. Epidural Infections-This can be in the form of an epidural abscess, which can cause pressure inside of the spine, causing compression of the spinal cord with subsequent paralysis. This would require an emergency surgery to decompress, and there are no guarantees that the patient would recover from the paralysis. Discitis-This is an infection of the intervertebral discs.  It occurs in about 1% of discography procedures.  It is difficult to treat and it may lead to surgery.        2. Pain: the needles have to go through skin and soft tissues, will cause soreness.       3. Damage to internal structures:  The nerves to be lesioned may be near blood vessels or    other nerves which can be potentially damaged.       4. Bleeding: Bleeding is more common if the patient is taking blood thinners such as  aspirin, Coumadin, Ticiid, Plavix, etc., or if he/she have some genetic predisposition  such as hemophilia. Bleeding into the spinal canal can cause compression of the spinal  cord with subsequent paralysis.  This would require an emergency  surgery to  decompress and there are no guarantees that the patient would recover from the  paralysis.       5. Pneumothorax:  Puncturing of a lung is a possibility, every time a needle is introduced in  the area of the chest or upper back.  Pneumothorax refers to free air around the  collapsed lung(s), inside of the thoracic cavity (chest cavity).  Another two possible  complications related to a similar event would include: Hemothorax and Chylothorax.   These are variations of the Pneumothorax, where instead of air around the collapsed  lung(s), you may have blood or chyle, respectively.       6. Spinal headaches: They may occur with any procedures in the area of the spine.       7. Persistent CSF (Cerebro-Spinal Fluid) leakage: This is a rare problem, but may occur  with prolonged intrathecal or epidural catheters either due to the formation of a fistulous  track or a dural tear.       8. Nerve damage: By working so close to the spinal cord, there is always a possibility of  nerve damage, which could be as serious as a permanent spinal cord injury with  paralysis.       9. Death:  Although rare, severe deadly allergic reactions known as "Anaphylactic  reaction" can occur to any of the medications used.      10. Worsening of the symptoms:  We can always make thing worse.  What are the chances  of something like this happening? Chances of any of this occuring are extremely low.  By statistics, you have more of a chance of getting killed in a motor vehicle accident: while driving to the hospital than any of the above occurring .  Nevertheless, you should be aware that they are possibilities.  In general, it is similar to taking a shower.  Everybody knows that you can slip, hit your head and get killed.  Does that mean that you should not shower again?  Nevertheless always keep in mind that statistics do not mean anything if you happen to be on the wrong side of them.  Even if a procedure has a 1 (one) in a  1,000,000 (million) chance of going wrong, it you happen to be that one..Also, keep in mind that by statistics, you have more of a chance of having something go wrong when taking medications.  Who should not have this procedure? If you are on a blood thinning medication (e.g. Coumadin, Plavix, see list of "Blood Thinners"), or if you have an active infection going on, you should not have the procedure.  If you are taking any blood thinners, please inform your physician.  How should I prepare for this procedure? Do not eat or drink anything at least six hours prior to the procedure. Bring a driver with you .  It cannot be a taxi. Come accompanied by an adult that can drive you back, and that is strong enough to help you if your legs get weak or numb from the local anesthetic. Take all of your medicines the morning of the procedure with just enough water to swallow them. If you have diabetes, make sure that you are scheduled to have your procedure done first thing in the morning, whenever possible. If you have diabetes, take only half of your insulin dose and notify our nurse that you have done so as soon as you arrive at the clinic. If you are diabetic, but only take blood sugar pills (oral hypoglycemic), then do not take them on the morning of your procedure.  You may take them after you have had the procedure. Do not take aspirin or any aspirin-containing medications, at least eleven (11) days prior to the procedure.  They may prolong bleeding. Wear loose fitting clothing that may be easy to take off and that you would not mind if it got stained with Betadine or blood. Do not wear any jewelry or perfume Remove any nail coloring.  It will interfere with some of our monitoring equipment.  NOTE: Remember that this is not meant to be interpreted as a complete list of all possible complications.  Unforeseen problems may occur.  BLOOD THINNERS The following drugs contain aspirin or other products,  which can cause increased bleeding during surgery and should not be taken for 2 weeks prior to and 1 week after surgery.  If you should need take something for relief of minor pain, you may take acetaminophen which is found in Tylenol,m Datril, Anacin-3 and Panadol. It is not blood thinner. The products listed below are.  Do not take any of the products listed below in addition to any listed on your instruction sheet.  A.P.C or A.P.C with Codeine Codeine Phosphate Capsules #3 Ibuprofen Ridaura  ABC compound Congesprin Imuran rimadil  Advil Cope Indocin Robaxisal  Alka-Seltzer Effervescent Pain Reliever and Antacid Coricidin or Coricidin-D  Indomethacin Rufen  Alka-Seltzer plus Cold Medicine Cosprin Ketoprofen S-A-C Tablets  Anacin Analgesic Tablets or Capsules Coumadin  Korlgesic Salflex  Anacin Extra Strength Analgesic tablets or capsules CP-2 Tablets Lanoril Salicylate  Anaprox Cuprimine Capsules Levenox Salocol  Anexsia-D Dalteparin Magan Salsalate  Anodynos Darvon compound Magnesium Salicylate Sine-off  Ansaid Dasin Capsules Magsal Sodium Salicylate  Anturane Depen Capsules Marnal Soma  APF Arthritis pain formula Dewitt's Pills Measurin Stanback  Argesic Dia-Gesic Meclofenamic Sulfinpyrazone  Arthritis Bayer Timed Release Aspirin Diclofenac Meclomen Sulindac  Arthritis pain formula Anacin Dicumarol Medipren Supac  Analgesic (Safety coated) Arthralgen Diffunasal Mefanamic Suprofen  Arthritis Strength Bufferin Dihydrocodeine Mepro Compound Suprol  Arthropan liquid Dopirydamole Methcarbomol with Aspirin Synalgos  ASA tablets/Enseals Disalcid Micrainin Tagament  Ascriptin Doan's Midol Talwin  Ascriptin A/D Dolene Mobidin Tanderil  Ascriptin Extra Strength Dolobid Moblgesic Ticlid  Ascriptin with Codeine Doloprin or Doloprin with Codeine Momentum Tolectin  Asperbuf Duoprin Mono-gesic Trendar  Aspergum Duradyne Motrin or Motrin IB Triminicin  Aspirin plain, buffered or enteric coated  Durasal Myochrisine Trigesic  Aspirin Suppositories Easprin Nalfon Trillsate  Aspirin with Codeine Ecotrin Regular or Extra Strength Naprosyn Uracel  Atromid-S Efficin Naproxen Ursinus  Auranofin Capsules Elmiron Neocylate Vanquish  Axotal Emagrin Norgesic Verin  Azathioprine Empirin or Empirin with Codeine Normiflo Vitamin E  Azolid Emprazil Nuprin Voltaren  Bayer Aspirin plain, buffered or children's or timed BC Tablets or powders Encaprin Orgaran Warfarin Sodium  Buff-a-Comp Enoxaparin Orudis Zorpin  Buff-a-Comp with Codeine Equegesic Os-Cal-Gesic   Buffaprin Excedrin plain, buffered or Extra Strength Oxalid   Bufferin Arthritis Strength Feldene Oxphenbutazone   Bufferin plain or Extra Strength Feldene Capsules Oxycodone with Aspirin   Bufferin with Codeine Fenoprofen Fenoprofen Pabalate or Pabalate-SF   Buffets II Flogesic Panagesic   Buffinol plain or Extra Strength Florinal or Florinal with Codeine Panwarfarin   Buf-Tabs Flurbiprofen Penicillamine   Butalbital Compound Four-way cold tablets Penicillin   Butazolidin Fragmin Pepto-Bismol   Carbenicillin Geminisyn Percodan   Carna Arthritis Reliever Geopen Persantine   Carprofen Gold's salt Persistin   Chloramphenicol Goody's Phenylbutazone   Chloromycetin Haltrain Piroxlcam   Clmetidine heparin Plaquenil   Cllnoril Hyco-pap Ponstel   Clofibrate Hydroxy chloroquine Propoxyphen         Before stopping any of these medications, be sure to consult the physician who ordered them.  Some, such as Coumadin (Warfarin) are ordered to prevent or treat serious conditions such as "deep thrombosis", "pumonary embolisms", and other heart problems.  The amount of time that you may need off of the medication may also vary with the medication and the reason for which you were taking it.  If you are taking any of these medications, please make sure you notify your pain physician before you undergo any procedures.         Selective Nerve Root  Block Patient Information  Description: Specific nerve roots exit the spinal canal and these nerves can be compressed and inflamed by a bulging disc and bone spurs.  By injecting steroids on the nerve root, we can potentially decrease the inflammation surrounding these nerves, which often leads to decreased pain.  Also, by injecting local anesthesia on the nerve root, this can provide [place] helpful information to give to your referring doctor if it decreases your pain.  Selective nerve root blocks can be done along the spine from the neck to the low back depending on the location of your pain.   After numbing the skin with local anesthesia, a small needle is passed to the nerve root and the position of the needle is verified using x-ray pictures.  After the needle is  in correct position, we then deposit the medication.  You may experience a pressure sensation while this is being done.  The entire block usually lasts less than 15 minutes.  Conditions that may be treated with selective nerve root blocks: Low back and leg pain Spinal stenosis Diagnostic block prior to potential surgery Neck and arm pain Post laminectomy syndrome  Preparation for the injection:  Do not eat any solid food or dairy products within 8 hours of your appointment. You may drink clear liquids up to 3 hours before an appointment.  Clear liquids include water, black coffee, juice or soda.  No milk or cream please. You may take your regular medications, including pain medications, with a sip of water before your appointment.  Diabetics should hold regular insulin (if taken separately) and take 1/2 normal NPH dose the morning of the procedure.  Carry some sugar containing items with you to your appointment. A driver must accompany you and be prepared to drive you home after your procedure. Bring all your current medications with you. An IV may be inserted and sedation may be given at the discretion of the physician. A blood  pressure cuff, EKG, and other monitors will often be applied during the procedure.  Some patients may need to have extra oxygen administered for a short period. You will be asked to provide medical information, including allergies, prior to the procedure.  We must know immediately if you are taking blood  Thinners (like Coumadin) or if you are allergic to IV iodine contrast (dye).  Possible side-effects: All are usually temporary Bleeding from needle site Light headedness Numbness and tingling Decreased blood pressure Weakness in arms/legs Pressure sensation in back/neck Pain at injection site (several days)  Possible complications: All are extremely rare Infection Nerve injury Spinal headache (a headache wore with upright position)  Call if you experience: Fever/chills associated with headache or increased back/neck pain Headache worsened by an upright position New onset weakness or numbness of an extremity below the injection site Hives or difficulty breathing (go to the emergency room) Inflammation or drainage at the injection site(s) Severe back/neck pain greater than usual New symptoms which are concerning to you  Please note:  Although the local anesthetic injected can often make your back or neck feel good for several hours after the injection the pain will likely return.  It takes 3-5 days for steroids to work on the nerve root. You may not notice any pain relief for at least one week.  If effective, we will often do a series of 3 injections spaced 3-6 weeks apart to maximally decrease your pain.    If you have any questions, please call [phone] [facility]

## 2024-03-28 ENCOUNTER — Encounter: Payer: Self-pay | Admitting: Student in an Organized Health Care Education/Training Program

## 2024-03-28 ENCOUNTER — Ambulatory Visit
Admission: RE | Admit: 2024-03-28 | Discharge: 2024-03-28 | Disposition: A | Discharge status: Home / Self Care | Source: Ambulatory Visit | Attending: Student in an Organized Health Care Education/Training Program | Admitting: Student in an Organized Health Care Education/Training Program

## 2024-03-28 ENCOUNTER — Ambulatory Visit (HOSPITAL_BASED_OUTPATIENT_CLINIC_OR_DEPARTMENT_OTHER): Admitting: Student in an Organized Health Care Education/Training Program

## 2024-03-28 VITALS — BP 133/89 | HR 84 | Temp 97.9°F | Resp 16 | Ht 68.0 in | Wt 165.0 lb

## 2024-03-28 DIAGNOSIS — G894 Chronic pain syndrome: Secondary | ICD-10-CM | POA: Diagnosis not present

## 2024-03-28 DIAGNOSIS — G6289 Other specified polyneuropathies: Secondary | ICD-10-CM | POA: Insufficient documentation

## 2024-03-28 DIAGNOSIS — G588 Other specified mononeuropathies: Secondary | ICD-10-CM

## 2024-03-28 MED ORDER — DEXAMETHASONE SODIUM PHOSPHATE 10 MG/ML IJ SOLN
20.0000 mg | Freq: Once | INTRAMUSCULAR | Status: AC
  Administered 2024-03-28: 20 mg

## 2024-03-28 MED ORDER — LIDOCAINE HCL 2 % IJ SOLN
20.0000 mL | Freq: Once | INTRAMUSCULAR | Status: AC
  Administered 2024-03-28: 200 mg

## 2024-03-28 MED ORDER — BUPRENORPHINE 7.5 MCG/HR TD PTWK: 1.0000 | MEDICATED_PATCH | TRANSDERMAL | 0 refills | Status: DC

## 2024-03-28 MED ORDER — ROPIVACAINE HCL 2 MG/ML IJ SOLN
18.0000 mL | Freq: Once | INTRAMUSCULAR | Status: AC
  Administered 2024-03-28: 18 mL via PERINEURAL

## 2024-03-28 MED ORDER — BUPRENORPHINE 5 MCG/HR TD PTWK: 1.0000 | MEDICATED_PATCH | TRANSDERMAL | 0 refills | Status: AC

## 2024-03-28 MED ORDER — LIDOCAINE HCL (PF) 2 % IJ SOLN
INTRAMUSCULAR | Status: AC
  Filled 2024-03-28: qty 10

## 2024-03-28 MED ORDER — DEXAMETHASONE SODIUM PHOSPHATE 10 MG/ML IJ SOLN
INTRAMUSCULAR | Status: AC
  Filled 2024-03-28: qty 2

## 2024-03-28 MED ORDER — ROPIVACAINE HCL 2 MG/ML IJ SOLN
INTRAMUSCULAR | Status: AC
  Filled 2024-03-28: qty 20

## 2024-03-28 NOTE — Progress Notes (Signed)
 PROVIDER NOTE: Interpretation of information contained herein should be left to medically-trained personnel. Specific patient instructions are provided elsewhere under Patient Instructions section of medical record. This document was created in part using STT-dictation technology, any transcriptional errors that may result from this process are unintentional.  Patient: Sarah [name] Type: Established DOB: 02-07-1932 MRN: [id number] PCP: Zachary Idelia LABOR, MD  Service: Procedure DOS: 03/28/2024 Setting: Ambulatory Location: Ambulatory outpatient facility Delivery: Face-to-face Provider: Wallie Sherry, MD Specialty: Interventional Pain Management Specialty designation: 09 Location: Outpatient facility Ref. Prov.: Zachary Idelia LABOR, MD       Interventional Therapy  Procedure bilateral cluneal nerve block  Imaging: Fluoroscopy-guided Non-spinal ([id number]) Anesthesia: Local anesthesia (1-2% Lidocaine ) DOS: 03/28/2024  Performed by: Wallie Sherry, MD  Purpose: Diagnostic/Therapeutic Rationale (medical necessity): procedure needed and proper for the diagnosis and/or treatment of Sarah [name]'s medical symptoms and needs. 1. Cluneal neuropathy   2. Chronic pain syndrome    NAS-11 Pain score:   Pre-procedure: 5 /10   Post-procedure: 5 /10      Position / Prep / Materials:  Position: Prone  Prep solution: ChloraPrep (2% chlorhexidine gluconate and 70% isopropyl alcohol) Prep Area: Entire posterior lumbosacral area  Materials:  Tray: Block Needle(s):  Type: Spinal  Gauge (G): 22  Length: 3.5-in Qty: One (1) per procedure side.  H&P (Pre-op Assessment):  Sarah [name] is a 89 y.o. (year old), female patient, seen today for interventional treatment. She  has a past surgical history that includes Abdominal hysterectomy; Breast biopsy (Bilateral); Breast excisional biopsy (Right); Knee arthroscopy w/ lateral retinacular repair; Esophagogastroduodenoscopy (N/A, 03/11/2024); and  Esophageal dilation (03/11/2024). Sarah [name] has a current medication list which includes the following prescription(s): acetaminophen , alendronate, amoxicillin -clavulanate, apoaequorin, beta carotene w/minerals, buprenorphine , [START ON 04/25/2024] buprenorphine , donepezil, dorzolamide , duloxetine , estradiol , fexofenadine, levothyroxine , lovastatin, lumigan, melatonin, nortriptyline, omeprazole , polyethylene glycol powder, pregabalin , and probiotic (lactobacillus). Her primarily concern today is the Back Pain (lower)  Initial Vital Signs:  Pulse/HCG Rate: 84ECG Heart Rate: 76 Temp: 97.9 F (36.6 C) Resp: 16 BP: 115/79 SpO2: 99 %  BMI: Estimated body mass index is 25.09 kg/m as calculated from the following:   Height as of this encounter: 5' 8 (1.727 m).   Weight as of this encounter: 165 lb (74.8 kg).  Risk Assessment: Allergies: Reviewed. She is allergic to cholecalciferol, macrobid  [nitrofurantoin ], codeine, hydrocodone, oxycodone , peanut oil, peanut-containing drug products, penicillins, and tramadol.  Allergy Precautions: None required Coagulopathies: Reviewed. None identified.  Blood-thinner therapy: None at this time Active Infection(s): Reviewed. None identified. Sarah [name] is afebrile  Site Confirmation: Sarah [name] was asked to confirm the procedure and laterality before marking the site Procedure checklist: Completed Consent: Before the procedure and under the influence of no sedative(s), amnesic(s), or anxiolytics, the patient was informed of the treatment options, risks and possible complications. To fulfill our ethical and legal obligations, as recommended by the American Medical Association's Code of Ethics, I have informed the patient of my clinical impression; the nature and purpose of the treatment or procedure; the risks, benefits, and possible complications of the intervention; the alternatives, including doing nothing; the risk(s) and benefit(s) of the alternative  treatment(s) or procedure(s); and the risk(s) and benefit(s) of doing nothing. The patient was provided information about the general risks and possible complications associated with the procedure. These may include, but are not limited to: failure to achieve desired goals, infection, bleeding, organ or nerve damage, allergic reactions, paralysis, and death. In addition, the patient was informed of those risks and  complications associated to the procedure, such as failure to decrease pain; infection; bleeding; organ or nerve damage with subsequent damage to sensory, motor, and/or autonomic systems, resulting in permanent pain, numbness, and/or weakness of one or several areas of the body; allergic reactions; (i.e.: anaphylactic reaction); and/or death. Furthermore, the patient was informed of those risks and complications associated with the medications. These include, but are not limited to: allergic reactions (i.e.: anaphylactic or anaphylactoid reaction(s)); adrenal axis suppression; blood sugar elevation that in diabetics may result in ketoacidosis or comma; water retention that in patients with history of congestive heart failure may result in shortness of breath, pulmonary edema, and decompensation with resultant heart failure; weight gain; swelling or edema; medication-induced neural toxicity; particulate matter embolism and blood vessel occlusion with resultant organ, and/or nervous system infarction; and/or aseptic necrosis of one or more joints. Finally, the patient was informed that Medicine is not an exact science; therefore, there is also the possibility of unforeseen or unpredictable risks and/or possible complications that may result in a catastrophic outcome. The patient indicated having understood very clearly. We have given the patient no guarantees and we have made no promises. Enough time was given to the patient to ask questions, all of which were answered to the patient's satisfaction. Ms.  [name] has indicated that she wanted to continue with the procedure. Attestation: I, the ordering provider, attest that I have discussed with the patient the benefits, risks, side-effects, alternatives, likelihood of achieving goals, and potential problems during recovery for the procedure that I have provided informed consent. Date  Time: 03/28/2024 12:26 PM  Pre-Procedure Preparation:  Monitoring: As per clinic protocol. Respiration, ETCO2, SpO2, BP, heart rate and rhythm monitor placed and checked for adequate function Safety Precautions: Patient was assessed for positional comfort and pressure points before starting the procedure. Time-out: I initiated and conducted the Time-out before starting the procedure, as per protocol. The patient was asked to participate by confirming the accuracy of the Time Out information. Verification of the correct person, site, and procedure were performed and confirmed by me, the nursing staff, and the patient. Time-out conducted as per Joint Commission's Universal Protocol (UP.01.01.01). Time: 1308 Start Time: 1308 hrs.  Description/Narrative of Procedure:          Start Time: 1308 hrs.  Rationale (medical necessity): procedure needed and proper for the diagnosis and/or treatment of the patient's medical symptoms and needs.  The skin over the right and left  posterior iliac crest region was prepped with chlorhexidine and draped in a sterile fashion. Using anatomic landmarks and fluoroscopic guidance, a 25-gauge spinal needle was advanced to the region approximately 7-8 cm lateral to midline and 1-2 cm superior to the right & left posterior superior iliac spine (PSIS), consistent with the expected course of the right and left superior cluneal nerve.  After negative aspiration, 10 mL of injectate composed of:  8 mL of 0.2% Ropivacaine , and 2 mL of Decadron  (10 mg/mL) was injected slowly without resistance: 5 cc for the left cluneal nerves, 5 cc for the  right cluneal nerves  The needle was withdrawn and hemostasis achieved. The patient tolerated the procedure well without immediate complications.   Vitals:   03/28/24 1232 03/28/24 1306 03/28/24 1310  BP: 115/79 130/84 132/89  Pulse: 84    Resp: 16 13 17   Temp: 97.9 F (36.6 C)    SpO2: 99% 98% 93%  Weight: 165 lb (74.8 kg)    Height: 5' 8 (1.727 m)  End Time: 1314 hrs.  Imaging Guidance (Non-Spinal):          Type of Imaging Technique: Fluoroscopy Guidance (Non-Spinal) Indication(s): Fluoroscopy guidance for needle placement to enhance accuracy in procedures requiring precise needle localization for targeted delivery of medication in or near specific anatomical locations not easily accessible without such real-time imaging assistance. Exposure Time: Please see nurses notes. Contrast: None used. Fluoroscopic Guidance: I was personally present during the use of fluoroscopy. Tunnel Vision Technique used to obtain the best possible view of the target area. Parallax error corrected before commencing the procedure. Direction-depth-direction technique used to introduce the needle under continuous pulsed fluoroscopy. Once target was reached, antero-posterior, oblique, and lateral fluoroscopic projection used confirm needle placement in all planes. Images permanently stored in EMR. Interpretation: No contrast injected. I personally interpreted the imaging intraoperatively. Adequate needle placement confirmed in multiple planes. Permanent images saved into the patient's record.  Post-operative Assessment:  Post-procedure Vital Signs:  Pulse/HCG Rate: 8475 Temp: 97.9 F (36.6 C) Resp: 17 BP: 132/89 SpO2: 93 %  EBL: None  Complications: No immediate post-treatment complications observed by team, or reported by patient.  Note: The patient tolerated the entire procedure well. A repeat set of vitals were taken after the procedure and the patient was kept under observation following  institutional policy, for this type of procedure. Post-procedural neurological assessment was performed, showing return to baseline, prior to discharge. The patient was provided with post-procedure discharge instructions, including a section on how to identify potential problems. Should any problems arise concerning this procedure, the patient was given instructions to immediately contact us , at any time, without hesitation. In any case, we plan to contact the patient by telephone for a follow-up status report regarding this interventional procedure.  Comments:  No additional relevant information.  Plan of Care (POC)  Orders:  Orders Placed This Encounter  Procedures   DG PAIN CL[facility] C-ARM 1-60 MIN NO REPORT    Intraoperative interpretation by procedural physician at [facility].    Standing Status:   Standing    Number of Occurrences:   1    Reason for exam::   Assistance in needle guidance and placement for procedures requiring needle placement in or near specific anatomical locations not easily accessible without such assistance.     Medications ordered for procedure: Meds ordered this encounter  Medications   lidocaine  (XYLOCAINE ) 2 % (with pres) injection 400 mg   ropivacaine  (PF) 2 mg/mL (0.2%) (NAROPIN ) injection 18 mL   dexamethasone  (DECADRON ) injection 20 mg   buprenorphine  (BUTRANS ) 5 MCG/HR PTWK    Sig: Place 1 patch onto the skin once a week for 28 days.    Dispense:  4 patch    Refill:  0    Chronic Pain: STOP Act (Not applicable) Fill 1 day early if closed on refill date. Avoid benzodiazepines within 8 hours of opioids   buprenorphine  (BUTRANS ) 7.5 MCG/HR    Sig: Place 1 patch onto the skin once a week for 28 days.    Dispense:  4 patch    Refill:  0    Chronic Pain: STOP Act (Not applicable) Fill 1 day early if closed on refill date. Avoid benzodiazepines within 8 hours of opioids   Medications administered: We administered lidocaine , ropivacaine  (PF) 2  mg/mL (0.2%), and dexamethasone .  See the medical record for exact dosing, route, and time of administration.    Follow-up plan:   Return in about 6 weeks (around 05/09/2024) for PPE, F2F.  Recent Visits Date Type Provider Dept  03/17/24 Office Visit Marcelino Nurse, MD [facility]  02/03/24 Procedure visit Marcelino Nurse, MD [facility]  01/19/24 Office Visit Marcelino Nurse, MD [facility]  Showing recent visits within past 90 days and meeting all other requirements Today's Visits Date Type Provider Dept  03/28/24 Procedure visit Marcelino Nurse, MD [facility]  Showing today's visits and meeting all other requirements Future Appointments No visits were found meeting these conditions. Showing future appointments within next 90 days and meeting all other requirements   Disposition: Discharge home  Discharge (Date  Time): 03/28/2024;   hrs.   Primary Care Physician: Zachary Idelia LABOR, MD Location: [facility] Note by: Nurse Marcelino, MD (TTS technology used. I apologize for any typographical errors that were not detected and corrected.) Date: 03/28/2024; Time: 1:17 PM  Disclaimer:  Medicine is not an Visual merchandiser. The only guarantee in medicine is that nothing is guaranteed. It is important to note that the decision to proceed with this intervention was based on the information collected from the patient. The Data and conclusions were drawn from the patient's questionnaire, the interview, and the physical examination. Because the information was provided in large part by the patient, it cannot be guaranteed that it has not been purposely or unconsciously manipulated. Every effort has been made to obtain as much relevant data as possible for this evaluation. It is important to note that the conclusions that lead to this procedure are derived in large part from the available data. Always take into account that the treatment will  also be dependent on availability of resources and existing treatment guidelines, considered by other Pain Management Practitioners as being common knowledge and practice, at the time of the intervention. For Medico-Legal purposes, it is also important to point out that variation in procedural techniques and pharmacological choices are the acceptable norm. The indications, contraindications, technique, and results of the above procedure should only be interpreted and judged by a Board-Certified Interventional Pain Specialist with extensive familiarity and expertise in the same exact procedure and technique.

## 2024-03-28 NOTE — Progress Notes (Signed)
 Safety precautions to be maintained throughout the outpatient stay will include: orient to surroundings, keep bed in low position, maintain call bell within reach at all times, provide assistance with transfer out of bed and ambulation.

## 2024-03-28 NOTE — Patient Instructions (Signed)

## 2024-04-19 ENCOUNTER — Ambulatory Visit
Admission: RE | Admit: 2024-04-19 | Discharge: 2024-04-19 | Disposition: A | Discharge status: Home / Self Care | Source: Ambulatory Visit | Attending: Family Medicine | Admitting: Family Medicine

## 2024-04-19 DIAGNOSIS — Z1231 Encounter for screening mammogram for malignant neoplasm of breast: Secondary | ICD-10-CM | POA: Insufficient documentation

## 2024-04-20 ENCOUNTER — Other Ambulatory Visit: Payer: Self-pay | Admitting: Family Medicine

## 2024-04-20 ENCOUNTER — Ambulatory Visit
Admission: RE | Admit: 2024-04-20 | Discharge: 2024-04-20 | Disposition: A | Discharge status: Home / Self Care | Source: Ambulatory Visit | Attending: Family Medicine | Admitting: Family Medicine

## 2024-04-20 DIAGNOSIS — R109 Unspecified abdominal pain: Secondary | ICD-10-CM | POA: Insufficient documentation

## 2024-05-10 ENCOUNTER — Encounter: Payer: Self-pay | Admitting: Student in an Organized Health Care Education/Training Program

## 2024-05-10 ENCOUNTER — Ambulatory Visit
Attending: Student in an Organized Health Care Education/Training Program | Admitting: Student in an Organized Health Care Education/Training Program

## 2024-05-10 VITALS — BP 130/83 | HR 85 | Temp 97.9°F | Resp 16 | Ht 68.0 in | Wt 165.0 lb

## 2024-05-10 DIAGNOSIS — M4726 Other spondylosis with radiculopathy, lumbar region: Secondary | ICD-10-CM | POA: Diagnosis not present

## 2024-05-10 DIAGNOSIS — M5126 Other intervertebral disc displacement, lumbar region: Secondary | ICD-10-CM | POA: Insufficient documentation

## 2024-05-10 DIAGNOSIS — M5416 Radiculopathy, lumbar region: Secondary | ICD-10-CM | POA: Insufficient documentation

## 2024-05-10 DIAGNOSIS — G894 Chronic pain syndrome: Secondary | ICD-10-CM | POA: Insufficient documentation

## 2024-05-10 DIAGNOSIS — M47816 Spondylosis without myelopathy or radiculopathy, lumbar region: Secondary | ICD-10-CM | POA: Insufficient documentation

## 2024-05-10 DIAGNOSIS — G588 Other specified mononeuropathies: Secondary | ICD-10-CM | POA: Insufficient documentation

## 2024-05-10 MED ORDER — BUPRENORPHINE 10 MCG/HR TD PTWK: 1.0000 | MEDICATED_PATCH | TRANSDERMAL | 2 refills | Status: DC

## 2024-05-10 NOTE — Progress Notes (Signed)
 Nursing Pain Medication Assessment:  Safety precautions to be maintained throughout the outpatient stay will include: orient to surroundings, keep bed in low position, maintain call bell within reach at all times, provide assistance with transfer out of bed and ambulation.  Medication Inspection Compliance: Sarah [name] did not comply with our request to bring her pills to be counted. She was reminded that bringing the medication bottles, even when empty, is a requirement.  Medication: None brought in. Pill/Patch Count: None available to be counted. Bottle Appearance: No container available. Did not bring bottle(s) to appointment. Filled Date: N/A Last Medication intake:  Day before yesterdaySafety precautions to be maintained throughout the outpatient stay will include: orient to surroundings, keep bed in low position, maintain call bell within reach at all times, provide assistance with transfer out of bed and ambulation.

## 2024-05-10 NOTE — Progress Notes (Signed)
 Safety precautions to be maintained throughout the outpatient stay will include: orient to surroundings, keep bed in low position, maintain call bell within reach at all times, provide assistance with transfer out of bed and ambulation.

## 2024-05-10 NOTE — Progress Notes (Signed)
 PROVIDER NOTE: Interpretation of information contained herein should be left to medically-trained personnel. Specific patient instructions are provided elsewhere under Patient Instructions section of medical record. This document was created in part using AI and STT-dictation technology, any transcriptional errors that may result from this process are unintentional.  Patient: Sarah [name]  Service: E/M   PCP: Zachary Idelia LABOR, MD  DOB: [birth year]  DOS: 05/10/2024  Provider: Wallie Sherry, MD  MRN: [id number]  Delivery: Face-to-face  Specialty: Interventional Pain Management  Type: Established Patient  Setting: Ambulatory outpatient facility  Specialty designation: 09  Referring Prov.: Zachary Idelia LABOR, MD  Location: Outpatient office facility       History of present illness (HPI) [name], a 89 y.o. year old female, is here today because of her Chronic pain syndrome [G89.4]. [name] primary complain today is Pain (Left flank)  Pertinent problems: [name] has Lumbar facet arthropathy; Lumbar radiculopathy; Lumbar disc herniation (L2/L3); Lumbar degenerative disc disease; Chronic pain syndrome; Neuropathy; Thyroid  nodule; and Bilateral occipital neuralgia on their pertinent problem list.  Pain Assessment: Severity of Chronic pain is reported as a 8 /10. Location: Flank Left/down left side to ankle at times. Onset: More than a month ago. Quality: Throbbing. Timing: Constant. Modifying factor(s): tylenol , sitting. Vitals:  height is 5' 8 (1.727 m) and weight is 165 lb (74.8 kg). Her temperature is 97.9 F (36.6 C). Her blood pressure is 130/83 and her pulse is 85. Her respiration is 16 and oxygen saturation is 96%.  BMI: Estimated body mass index is 25.09 kg/m as calculated from the following:   Height as of this encounter: 5' 8 (1.727 m).   Weight as of this encounter: 165 lb (74.8 kg).  Last encounter: 03/17/2024. Last procedure: 03/28/2024.  Reason for  encounter: post-procedure evaluation and assessment.   Discussed the use of AI scribe software for clinical note transcription with the patient, who gave verbal consent to proceed.  History of Present Illness   Sarah [name] is a 89 year old female who presents left flank pain.  She is also here for postprocedural evaluation and medication management.  She endorses significant pain relief after her bilateral cluneal nerve block as detailed below.  Currently, she uses a Butrans  patch at a dose of 7.5 mcg/hour, which offers some relief. She has one patch remaining.  This was titrated up from a 5 mcg an hour patch  In regards to her left flank pain, A CT scan was performed to exclude renal stones, yielding negative results. She maintains adequate hydration by drinking plenty of fluids regularly.      Post-Procedure Evaluation   Procedure bilateral cluneal nerve block   Imaging: Fluoroscopy-guided Non-spinal ([id number]) Anesthesia: Local anesthesia (1-2% Lidocaine ) DOS: 03/28/2024  Performed by: Wallie Sherry, MD   Purpose: Diagnostic/Therapeutic Rationale (medical necessity): procedure needed and proper for the diagnosis and/or treatment of [name]'s medical symptoms and needs. 1. Cluneal neuropathy   2. Chronic pain syndrome     NAS-11 Pain score:        Pre-procedure: 5 /10        Post-procedure: 5 /10   Effectiveness:  Initial hour after procedure: 100 %  Subsequent 4-6 hours post-procedure: 100 %  Analgesia past initial 6 hours: 100 %  Ongoing improvement:  Analgesic:  100% Function: [name] reports improvement in function ROM: [name] reports improvement in ROM  ROS  Constitutional: Denies any fever or chills Gastrointestinal: No reported hemesis, hematochezia, vomiting, or  acute GI distress Musculoskeletal: Left flank pain, chronic low back pain, significantly improved after RFA and cluneal nerve block Neurological: No reported episodes of acute onset  apraxia, aphasia, dysarthria, agnosia, amnesia, paralysis, loss of coordination, or loss of consciousness  Medication Review  Apoaequorin, DULoxetine , Probiotic (Lactobacillus), acetaminophen , alendronate, amoxicillin -clavulanate, beta carotene w/minerals, bimatoprost, buprenorphine , donepezil, dorzolamide , estradiol , fexofenadine, levothyroxine , lovastatin, melatonin, nortriptyline, omeprazole , polyethylene glycol powder, and pregabalin   History Review  Allergy: [name] is allergic to cholecalciferol, macrobid  [nitrofurantoin ], codeine, hydrocodone, oxycodone , peanut oil, peanut-containing drug products, penicillins, and tramadol. Drug: [name]  reports no history of drug use. Alcohol:  reports no history of alcohol use. Tobacco:  reports that she has never smoked. She has never used smokeless tobacco. Social: [name]  reports that she has never smoked. She has never used smokeless tobacco. She reports that she does not drink alcohol and does not use drugs. Medical:  has a past medical history of Allergic rhinitis, Benign essential HTN (09/21/2015), Bilateral occipital neuralgia, Cancer (HCC), Closed nondisplaced fracture of acromial end of right clavicle, Displacement of lumbar intervertebral disc without myelopathy, Dysphagia, Headache disorder, Localized primary osteoarthritis, Memory difficulty, Mixed hyperlipidemia, Moderate mitral insufficiency, Moderate tricuspid insufficiency, Neuropathy, Numbness in feet, Pre-diabetes, Seizures (HCC), Sensory ataxia, SI joint arthritis (HCC), Sleep apnea, SOBOE (shortness of breath on exertion), Spinal stenosis of lumbar region, 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 Surgical: [name]  has a past surgical history that includes Abdominal hysterectomy; Breast biopsy (Bilateral); Breast excisional biopsy (Right); Knee arthroscopy w/ lateral retinacular repair;  Esophagogastroduodenoscopy (N/A, 03/11/2024); and Esophageal dilation (03/11/2024). Family: family history includes Brain cancer in her sister; Breast cancer in her maternal aunt; Breast cancer (age of onset: 69) in her maternal aunt; Breast cancer (age of onset: 34) in her mother; Breast cancer (age of onset: 61) in her maternal uncle; Cancer in her brother and sister; Tracheal cancer in her father.  Laboratory Chemistry Profile   Renal Lab Results  Component Value Date   BUN 15 10/24/2023   CREATININE 0.73 10/24/2023   GFRAA 77 09/12/2020   GFRNONAA >60 10/24/2023    Hepatic Lab Results  Component Value Date   AST 19 11/26/2015   ALT 15 11/26/2015   ALBUMIN 4.7 11/26/2015   ALKPHOS 64 11/26/2015    Electrolytes Lab Results  Component Value Date   NA 140 10/24/2023   K 3.8 10/24/2023   CL 105 10/24/2023   CALCIUM 9.1 10/24/2023   MG 2.3 01/20/2023    Bone No results found for: VD25OH, VD125OH2TOT, CI6874NY7, CI7874NY7, 25OHVITD1, 25OHVITD2, 25OHVITD3, TESTOFREE, TESTOSTERONE  Inflammation (CRP: Acute Phase) (ESR: Chronic Phase) No results found for: CRP, ESRSEDRATE, LATICACIDVEN       Note: Above Lab results reviewed.  Recent Imaging Review  MM 3D SCREENING MAMMOGRAM BILATERAL BREAST CLINICAL DATA:  Screening.  EXAM: DIGITAL SCREENING BILATERAL MAMMOGRAM WITH TOMOSYNTHESIS AND CAD  TECHNIQUE: Bilateral screening digital craniocaudal and mediolateral oblique mammograms were obtained. Bilateral screening digital breast tomosynthesis was performed. The images were evaluated with computer-aided detection.  COMPARISON:  Previous exam(s).  ACR Breast Density Category b: There are scattered areas of fibroglandular density.  FINDINGS: There are no findings suspicious for malignancy.  IMPRESSION: No mammographic evidence of malignancy. A result letter of this screening mammogram will be mailed directly to the  patient.  RECOMMENDATION: Screening mammogram in one year. (Code:SM-B-01Y)  BI-RADS CATEGORY  1: Negative.  Electronically Signed  By: Toribio Agreste M.D.   On: 04/21/2024 09:49 Note: Reviewed        Physical Exam  Vitals: BP 130/83   Pulse 85   Temp 97.9 F (36.6 C)   Resp 16   Ht 5' 8 (1.727 m)   Wt 165 lb (74.8 kg)   SpO2 96%   BMI 25.09 kg/m  BMI: Estimated body mass index is 25.09 kg/m as calculated from the following:   Height as of this encounter: 5' 8 (1.727 m).   Weight as of this encounter: 165 lb (74.8 kg). Ideal: Ideal body weight: 63.9 kg (140 lb 14 oz) Adjusted ideal body weight: 68.3 kg (150 lb 8.4 oz) General appearance: Well nourished, well developed, and well hydrated. In no apparent acute distress Mental status: Alert, oriented x 3 (person, place, & time)       Respiratory: No evidence of acute respiratory distress Eyes: PERLA   Assessment   Diagnosis Status  1. Chronic pain syndrome   2. Cluneal neuropathy   3. Lumbar facet arthropathy (L4/5, L5/S1)   4. Spondylosis of lumbar joint   5. Lumbar disc herniation (L2/L3)   6. Lumbar radiculopathy    Controlled Controlled Controlled   Updated Problems: No problems updated.  Plan of Care  Assessment and Plan    Chronic low back pain related to lumbar facet arthropathy, lumbar spondylosis and cluneal neuropathy Positive response to lumbar radiofrequency ablation and bilateral cluneal nerve block.  Will continue to monitor and repeat as needed  Chronic pain syndrome: Finding relief with Butrans  patch at 7.5 mcg an hour, discussed titrating up to 10 mcg an hour.  Informed patient of side effects to be aware of including sedation, mood changes, nausea, constipation.  PMP reviewed.       [name] has a current medication list which includes the following long-term medication(s): donepezil, duloxetine , fexofenadine, levothyroxine , lovastatin, nortriptyline, and  omeprazole .  Pharmacotherapy (Medications Ordered): Meds ordered this encounter  Medications   buprenorphine  (BUTRANS ) 10 MCG/HR PTWK    Sig: Place 1 patch onto the skin once a week.    Dispense:  4 patch    Refill:  2    Chronic Pain: STOP Act (Not applicable) Fill 1 day early if closed on refill date. Avoid benzodiazepines within 8 hours of opioids   Orders:  No orders of the defined types were placed in this encounter.    Finds benefit with bilateral sacroiliac joint injection, previous one 03/14/2019 and then second 1 05/25/2019.  Also finds lumbar epidural steroid injections for when she has sciatic flare, consider repeating.  Previous one performed on 10/13/2018 at L2-L3.  Status post bilateral C4, C5, C6, C7 cervical facet medial branch nerve block on 03/05/2020, 05/21/20 for cervical facet arthropathy. Bilateral greater occipital nerve block 08/13/2020 helped significantly, repeat as needed.              Return in about 3 months (around 08/10/2024) for MM, F2F.    Recent Visits Date Type Provider Dept  03/28/24 Procedure visit Marcelino Nurse, MD [facility]  03/17/24 Office Visit Marcelino Nurse, MD [facility]  Showing recent visits within past 90 days and meeting all other requirements Today's Visits Date Type Provider Dept  05/10/24 Office Visit Marcelino Nurse, MD [facility]  Showing today's visits and meeting all other requirements Future Appointments Date Type Provider Dept  08/02/24 Appointment Marcelino Nurse, MD [facility]  Showing future appointments within next 90 days  and meeting all other requirements  I discussed the assessment and treatment plan with the patient. The patient was provided an opportunity to ask questions and all were answered. The patient agreed with the plan and demonstrated an understanding of the instructions.  Patient advised to call back or seek an in-person evaluation if the symptoms or condition  worsens.  Duration of encounter: .  Total time on encounter, as per AMA guidelines included both the face-to-face and non-face-to-face time personally spent by the physician and/or other qualified health care professional(s) on the day of the encounter (includes time in activities that require the physician or other qualified health care professional and does not include time in activities normally performed by clinical staff). Physician's time may include the following activities when performed: Preparing to see the patient (e.g., pre-charting review of records, searching for previously ordered imaging, lab work, and nerve conduction tests) Review of prior analgesic pharmacotherapies. Reviewing PMP Interpreting ordered tests (e.g., lab work, imaging, nerve conduction tests) Performing post-procedure evaluations, including interpretation of diagnostic procedures Obtaining and/or reviewing separately obtained history Performing a medically appropriate examination and/or evaluation Counseling and educating the patient/family/caregiver Ordering medications, tests, or procedures Referring and communicating with other health care professionals (when not separately reported) Documenting clinical information in the electronic or other health record Independently interpreting results (not separately reported) and communicating results to the patient/ family/caregiver Care coordination (not separately reported)  Note by: Wallie Sherry, MD (TTS and AI technology used. I apologize for any typographical errors that were not detected and corrected.) Date: 05/10/2024; Time: 11:48 AM

## 2024-06-09 ENCOUNTER — Other Ambulatory Visit: Payer: Self-pay | Admitting: Neurology

## 2024-06-09 DIAGNOSIS — R296 Repeated falls: Secondary | ICD-10-CM

## 2024-06-09 DIAGNOSIS — R200 Anesthesia of skin: Secondary | ICD-10-CM

## 2024-06-09 DIAGNOSIS — G8929 Other chronic pain: Secondary | ICD-10-CM

## 2024-06-09 DIAGNOSIS — M25552 Pain in left hip: Secondary | ICD-10-CM

## 2024-06-13 ENCOUNTER — Inpatient Hospital Stay
Admission: RE | Admit: 2024-06-13 | Discharge: 2024-06-13 | Disposition: A | Discharge status: Home / Self Care | Payer: Self-pay | Source: Ambulatory Visit | Attending: Otolaryngology | Admitting: Otolaryngology

## 2024-06-13 ENCOUNTER — Other Ambulatory Visit: Payer: Self-pay | Admitting: Otolaryngology

## 2024-06-13 DIAGNOSIS — E041 Nontoxic single thyroid nodule: Secondary | ICD-10-CM

## 2024-06-14 NOTE — Progress Notes (Signed)
 Jenna Cordella LABOR, MD sent to [facility]ose S PROCEDURE / BIOPSY REVIEW Date: 06/14/24  Requested Biopsy site: right thyroid  nodule Reason for request: thyroid  nodule #2 1.35 x 1.26 outside study Imaging review: Best seen on US  outside study  Decision: Approved Imaging modality to perform: Ultrasound Schedule with: No sedation / Local anesthetic Schedule for: Any  APP  Additional comments: @Schedulers .  Please contact me with questions, concerns, or if issue pertaining to this request arise.  Cordella LABOR Jenna, MD [facility]

## 2024-06-15 ENCOUNTER — Other Ambulatory Visit: Payer: Self-pay | Admitting: Otolaryngology

## 2024-06-15 DIAGNOSIS — E041 Nontoxic single thyroid nodule: Secondary | ICD-10-CM

## 2024-06-16 ENCOUNTER — Ambulatory Visit
Admission: RE | Admit: 2024-06-16 | Discharge: 2024-06-16 | Disposition: A | Discharge status: Home / Self Care | Source: Ambulatory Visit | Attending: Neurology | Admitting: Neurology

## 2024-06-16 DIAGNOSIS — G8929 Other chronic pain: Secondary | ICD-10-CM | POA: Insufficient documentation

## 2024-06-16 DIAGNOSIS — R200 Anesthesia of skin: Secondary | ICD-10-CM | POA: Diagnosis present

## 2024-06-16 DIAGNOSIS — M25552 Pain in left hip: Secondary | ICD-10-CM | POA: Diagnosis present

## 2024-06-16 DIAGNOSIS — R202 Paresthesia of skin: Secondary | ICD-10-CM | POA: Diagnosis present

## 2024-06-16 DIAGNOSIS — R296 Repeated falls: Secondary | ICD-10-CM | POA: Diagnosis present

## 2024-06-16 DIAGNOSIS — M545 Low back pain, unspecified: Secondary | ICD-10-CM | POA: Diagnosis present

## 2024-06-21 NOTE — Progress Notes (Signed)
 Patient for US  guided FNA RT thyroid  nodule biopsy on Wed 06/22/24, I called and spoke with the patient on the phone and gave pre-procedure instructions. Pt was made aware to be here at 2p and the [facility] [employer]. Pt stated understanding. Called 06/15/24

## 2024-06-22 ENCOUNTER — Other Ambulatory Visit: Payer: Self-pay | Admitting: Urology

## 2024-06-22 ENCOUNTER — Ambulatory Visit
Admission: RE | Admit: 2024-06-22 | Discharge: 2024-06-22 | Disposition: A | Discharge status: Home / Self Care | Source: Ambulatory Visit | Attending: Otolaryngology | Admitting: Otolaryngology

## 2024-06-22 DIAGNOSIS — E041 Nontoxic single thyroid nodule: Secondary | ICD-10-CM | POA: Diagnosis present

## 2024-06-22 MED ORDER — LIDOCAINE HCL (PF) 1 % IJ SOLN
2.0000 mL | Freq: Once | INTRAMUSCULAR | Status: AC
  Administered 2024-06-22: 2 mL via INTRADERMAL
  Filled 2024-06-22: qty 2

## 2024-06-22 NOTE — Procedures (Signed)
 PROCEDURE SUMMARY:  Using direct ultrasound guidance, 5 passes were made using 25 g needles into the nodule within the inferior right lobe of the thyroid .   Ultrasound was used to confirm needle placements on all occasions.   EBL = trace  Specimens were sent to Pathology for analysis.  See procedure note under Imaging tab in Epic for full procedure details.    Electronically Signed: Carlin DELENA Griffon, PA-C 06/22/2024, 3:40 PM

## 2024-06-24 LAB — CYTOLOGY - NON PAP

## 2024-06-27 ENCOUNTER — Encounter: Payer: Self-pay | Admitting: Nurse Practitioner

## 2024-06-27 ENCOUNTER — Ambulatory Visit: Attending: Nurse Practitioner | Admitting: Nurse Practitioner

## 2024-06-27 VITALS — BP 113/81 | HR 90 | Temp 97.5°F | Resp 18 | Ht 68.0 in | Wt 163.0 lb

## 2024-06-27 DIAGNOSIS — M5416 Radiculopathy, lumbar region: Secondary | ICD-10-CM | POA: Insufficient documentation

## 2024-06-27 DIAGNOSIS — M47816 Spondylosis without myelopathy or radiculopathy, lumbar region: Secondary | ICD-10-CM | POA: Insufficient documentation

## 2024-06-27 DIAGNOSIS — G894 Chronic pain syndrome: Secondary | ICD-10-CM | POA: Insufficient documentation

## 2024-06-27 DIAGNOSIS — G588 Other specified mononeuropathies: Secondary | ICD-10-CM | POA: Insufficient documentation

## 2024-06-27 DIAGNOSIS — M4726 Other spondylosis with radiculopathy, lumbar region: Secondary | ICD-10-CM

## 2024-06-27 NOTE — Progress Notes (Signed)
 PROVIDER NOTE: Interpretation of information contained herein should be left to medically-trained personnel. Specific patient instructions are provided elsewhere under Patient Instructions section of medical record. This document was created in part using AI and STT-dictation technology, any transcriptional errors that may result from this process are unintentional.  Patient: Sarah [name]  Service: E/M   PCP: Sarah Idelia LABOR, MD  DOB: 07/21/1932  DOS: 06/27/2024  Provider: Emmy MARLA Blanch, NP  MRN: [id number]  Delivery: Face-to-face  Specialty: Interventional Pain Management  Type: Established Patient  Setting: Ambulatory outpatient facility  Specialty designation: 09  Referring Prov.: Sarah Idelia LABOR, MD  Location: Outpatient office facility       History of present illness (HPI) Sarah [name], a 89 y.o. year old female, is here today because of her lower back pain.  Sarah [name] primary complain today is Back Pain (lower)  Pertinent problems: Sarah [name] has Lumbar facet arthropathy; Lumbar radiculopathy; Lumbar disc herniation (L2/L3); Lumbar degenerative disc disease; Chronic pain syndrome; Neuropathy; Thyroid  nodule; and Bilateral occipital neuralgia on their pertinent problem list.  Pain Assessment: Severity of Chronic pain is reported as a 8 /10. Location: Back Lower/left hip and left leg to the toes. Onset: More than a month ago. Quality: Aching. Timing: Constant. Modifying factor(s): nothing. Vitals:  height is 5' 8 (1.727 m) and weight is 163 lb (73.9 kg). Her temporal temperature is 97.5 F (36.4 C) (abnormal). Her blood pressure is 113/81 and her pulse is 90. Her respiration is 18 and oxygen saturation is 100%.  BMI: Estimated body mass index is 24.78 kg/m as calculated from the following:   Height as of this encounter: 5' 8 (1.727 m).   Weight as of this encounter: 163 lb (73.9 kg).  Last encounter: Visit date not found. Last procedure: 03/28/2024  Reason  for encounter: evaluation for possible interventional PM therapy/treatment.  The patient continues to experience low back pain radiating to the buttocks but not to the hips.  Of note, she underwent a cluneal nerve block on March 28, 2024, which provided significant pain relief and functional improvement.  She is currently experiencing a flareup of her low back pain; therefore we discussed repeating the procedure, and she agrees with the plan. Pharmacotherapy Assessment   Monitoring: [place] PMP: PDMP reviewed during this encounter.       Pharmacotherapy: No side-effects or adverse reactions reported. Compliance: No problems identified. Effectiveness: Clinically acceptable.  No notes on file  UDS:  No results found for: SUMMARY  No results found for: CBDTHCR No results found for: D8THCCBX No results found for: D9THCCBX  ROS  Constitutional: Denies any fever or chills Gastrointestinal: No reported hemesis, hematochezia, vomiting, or acute GI distress Musculoskeletal: Low back pain Neurological: No reported episodes of acute onset apraxia, aphasia, dysarthria, agnosia, amnesia, paralysis, loss of coordination, or loss of consciousness  Medication Review  Apoaequorin, Cholecalciferol, DULoxetine , Multiple Vitamins-Minerals, Probiotic (Lactobacillus), acetaminophen , alendronate, amoxicillin -clavulanate, beta carotene w/minerals, bimatoprost, buprenorphine , cyanocobalamin, donepezil, dorzolamide , estradiol , fexofenadine, levothyroxine , lovastatin, melatonin, nortriptyline, omeprazole , polyethylene glycol powder, and pregabalin   History Review  Allergy: Sarah [name] is allergic to cholecalciferol, macrobid  [nitrofurantoin ], codeine, hydrocodone, oxycodone , peanut oil, peanut-containing drug products, penicillins, and tramadol. Drug: Sarah [name]  reports no history of drug use. Alcohol:  reports no history of alcohol use. Tobacco:  reports that she has never smoked. She has never used smokeless  tobacco. Social: Sarah [name]  reports that she has never smoked. She has never used smokeless tobacco. She reports that she does  not drink alcohol and does not use drugs. Medical:  has a past medical history of Allergic rhinitis, Benign essential HTN (09/21/2015), Bilateral occipital neuralgia, Cancer (HCC), Closed nondisplaced fracture of acromial end of right clavicle, Displacement of lumbar intervertebral disc without myelopathy, Dysphagia, Headache disorder, Localized primary osteoarthritis, Memory difficulty, Mixed hyperlipidemia, Moderate mitral insufficiency, Moderate tricuspid insufficiency, Neuropathy, Numbness in feet, Pre-diabetes, Seizures (HCC), Sensory ataxia, SI joint arthritis, Sleep apnea, SOBOE (shortness of breath on exertion), Spinal stenosis of lumbar region, 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 Surgical: Sarah [name]  has a past surgical history that includes Abdominal hysterectomy; Breast biopsy (Bilateral); Breast excisional biopsy (Right); Knee arthroscopy w/ lateral retinacular repair; Esophagogastroduodenoscopy (N/A, 03/11/2024); and Esophageal dilation (03/11/2024). Family: family history includes Brain cancer in her sister; Breast cancer in her maternal aunt; Breast cancer (age of onset: 55) in her maternal aunt; Breast cancer (age of onset: 75) in her mother; Breast cancer (age of onset: 41) in her maternal uncle; Cancer in her brother and sister; Tracheal cancer in her father.  Laboratory Chemistry Profile   Renal Lab Results  Component Value Date   BUN 15 10/24/2023   CREATININE 0.73 10/24/2023   GFRAA 77 09/12/2020   GFRNONAA >60 10/24/2023    Hepatic Lab Results  Component Value Date   AST 19 11/26/2015   ALT 15 11/26/2015   ALBUMIN 4.7 11/26/2015   ALKPHOS 64 11/26/2015    Electrolytes Lab Results  Component Value Date   NA 140 10/24/2023   K 3.8 10/24/2023   CL 105  10/24/2023   CALCIUM 9.1 10/24/2023   MG 2.3 01/20/2023    Bone No results found for: VD25OH, VD125OH2TOT, CI6874NY7, CI7874NY7, 25OHVITD1, 25OHVITD2, 25OHVITD3, TESTOFREE, TESTOSTERONE  Inflammation (CRP: Acute Phase) (ESR: Chronic Phase) No results found for: CRP, ESRSEDRATE, LATICACIDVEN       Note: Above Lab results reviewed.  Recent Imaging Review  US  FNA BX THYROID  1ST LESION AFIRMA INDICATION: Indeterminate thyroid  nodule  EXAM: ULTRASOUND GUIDED FINE NEEDLE ASPIRATION OF INDETERMINATE THYROID  NODULE  COMPARISON:  U/S soft tissue head and neck on 05/06/2024  MEDICATIONS: 3 cc of 1% lidocaine .  COMPLICATIONS: None immediate.  TECHNIQUE: Informed written consent was obtained from the patient after a discussion of the risks, benefits and alternatives to treatment. Questions regarding the procedure were encouraged and answered. A timeout was performed prior to the initiation of the procedure.  Pre-procedural ultrasound scanning demonstrated unchanged size and appearance of the indeterminate nodule within the right inferior thyroid  lobe.  The procedure was planned. The neck was prepped in the usual sterile fashion, and a sterile drape was applied covering the operative field. A timeout was performed prior to the initiation of the procedure. Local anesthesia was provided with 1% lidocaine .  Under direct ultrasound guidance, 5 FNA biopsies were performed of the right inferior thyroid  nodule with a 25 gauge needle. Multiple ultrasound images were saved for procedural documentation purposes. The samples were prepared and submitted to pathology.  Limited post procedural scanning was negative for hematoma or additional complication. Dressings were placed. The patient tolerated the above procedures procedure well without immediate postprocedural complication.  FINDINGS: Nodule reference number based on prior diagnostic ultrasound:  #3.  Maximum size: 1.3 cm  Location: Right; inferior  ACR TI-RADS risk category: TR4 (4-6 points)  Reason for biopsy: meets ACR TI-RADS criteria  Ultrasound imaging confirms appropriate placement of the needles within the thyroid  nodule.  IMPRESSION: Technically successful ultrasound guided fine needle aspiration of nodule #3 in the inferior right thyroid  lobe.  Procedure performed by Carlin Griffon, PA-C  Electronically Signed   By: JONETTA Faes M.D.   On: 06/23/2024 07:13 Note: Reviewed        Physical Exam  Vitals: BP 113/81 (Cuff Size: Normal)   Pulse 90   Temp (!) 97.5 F (36.4 C) (Temporal)   Resp 18   Ht 5' 8 (1.727 m)   Wt 163 lb (73.9 kg)   SpO2 100%   BMI 24.78 kg/m  BMI: Estimated body mass index is 24.78 kg/m as calculated from the following:   Height as of this encounter: 5' 8 (1.727 m).   Weight as of this encounter: 163 lb (73.9 kg). Ideal: Ideal body weight: 63.9 kg (140 lb 14 oz) Adjusted ideal body weight: 67.9 kg (149 lb 11.6 oz) General appearance: Well nourished, well developed, and well hydrated. In no apparent acute distress Mental status: Alert, oriented x 3 (person, place, & time)       Respiratory: No evidence of acute respiratory distress Eyes: PERLA  Musculoskeletal: Low back pain (Lateral side of iliac crest) Assessment   Diagnosis Status  1. Cluneal neuropathy   2. Chronic pain syndrome   3. Spondylosis of lumbar joint   4. Lumbar facet arthropathy (L4/5, L5/S1)   5. Lumbar radiculopathy    Having a Flare-up Having a Flare-up Controlled   Updated Problems: No problems updated.  Plan of Care  Problem-specific:  Assessment and Plan Chronic lower back pain: The patient continues to experience chronic lower back pain without significant relief from previous hip injection or ablation.  Pain remains localized to the lower back without radiation to the hip, though it may involve the buttock area.  Differential diagnosis included  sacroiliac joint pain and cluneal nerve involvement.  Consider repeat cluneal nerve block bilaterally.   Plan: (Clinic): (B) Cluneal NB with Dr. Marcelino    Ms. Chenay Nesmith has a current medication list which includes the following long-term medication(s): donepezil, duloxetine , fexofenadine, levothyroxine , lovastatin, nortriptyline, and omeprazole .  Pharmacotherapy (Medications Ordered): No orders of the defined types were placed in this encounter.  Orders:  Orders Placed This Encounter  Procedures   CLUNEAL NERVE BLOCK    Standing Status:   Future    Expected Date:   07/11/2024    Expiration Date:   06/27/2025    Scheduling Instructions:     Side: Bilateral     Sedation: Patient's choice.     Timeframe: ASAA    Where will this procedure be performed?:   [facility]        Return in about 2 weeks (around 07/11/2024) for (Clinic): (B) Cluneal NB with Dr. Marcelino .    Recent Visits Date Type Provider Dept  05/10/24 Office Visit Marcelino Nurse, MD [facility]  Showing recent visits within past 90 days and meeting all other requirements Today's Visits Date Type Provider Dept  06/27/24 Office Visit Camarion Weier K, NP [facility]  Showing today's visits and meeting all other requirements Future Appointments Date Type Provider Dept  07/11/24 Appointment Marcelino Nurse, MD [facility]  08/02/24 Appointment Marcelino Nurse, MD [facility]  Showing future appointments within next 90 days and meeting all other requirements  I discussed the assessment and treatment plan with the patient. The patient was provided an opportunity to ask questions and all were answered. The patient agreed with the plan  and demonstrated an understanding of the instructions.  Patient advised to call back or seek an in-person evaluation if the symptoms or condition worsens.  I personally spent a total of 20 minutes in the care of the patient today  including preparing to see the patient, getting/reviewing separately obtained history, performing a medically appropriate exam/evaluation, counseling and educating, placing orders, referring and communicating with other health care professionals, documenting clinical information in the EHR, independently interpreting results, communicating results, and coordinating care.   Note by: Sarah MARLA Blanch, NP  Date: 06/27/2024; Time: 3:16 PM

## 2024-06-27 NOTE — Patient Instructions (Signed)
____________________________________________________________________________________________  General Risks and Possible Complications  Patient Responsibilities: It is important that you read this as it is part of your informed consent. It is our duty to inform you of the risks and possible complications associated with treatments offered to you. It is your responsibility as a patient to read this and to ask questions about anything that is not clear or that you believe was not covered in this document.  Patient's Rights: You have the right to refuse treatment. You also have the right to change your mind, even after initially having agreed to have the treatment done. However, under this last option, if you wait until the last second to change your mind, you may be charged for the materials used up to that point.  Introduction: Medicine is not an exact science. Everything in Medicine, including the lack of treatment(s), carries the potential for danger, harm, or loss (which is by definition: Risk). In Medicine, a complication is a secondary problem, condition, or disease that can aggravate an already existing one. All treatments carry the risk of possible complications. The fact that a side effects or complications occurs, does not imply that the treatment was conducted incorrectly. It must be clearly understood that these can happen even when everything is done following the highest safety standards.  No treatment: You can choose not to proceed with the proposed treatment alternative. The "PRO(s)" would include: avoiding the risk of complications associated with the therapy. The "CON(s)" would include: not getting any of the treatment benefits. These benefits fall under one of three categories: diagnostic; therapeutic; and/or palliative. Diagnostic benefits include: getting information which can ultimately lead to improvement of the disease or symptom(s). Therapeutic benefits are those associated with the  successful treatment of the disease. Finally, palliative benefits are those related to the decrease of the primary symptoms, without necessarily curing the condition (example: decreasing the pain from a flare-up of a chronic condition, such as incurable terminal cancer).  General Risks and Complications: These are associated to most interventional treatments. They can occur alone, or in combination. They fall under one of the following six (6) categories: no benefit or worsening of symptoms; bleeding; infection; nerve damage; allergic reactions; and/or death. No benefits or worsening of symptoms: In Medicine there are no guarantees, only probabilities. No healthcare provider can ever guarantee that a medical treatment will work, they can only state the probability that it may. Furthermore, there is always the possibility that the condition may worsen, either directly, or indirectly, as a consequence of the treatment. Bleeding: This is more common if the patient is taking a blood thinner, either prescription or over the counter (example: Goody Powders, Fish oil, Aspirin, Garlic, etc.), or if suffering a condition associated with impaired coagulation (example: Hemophilia, cirrhosis of the liver, low platelet counts, etc.). However, even if you do not have one on these, it can still happen. If you have any of these conditions, or take one of these drugs, make sure to notify your treating physician. Infection: This is more common in patients with a compromised immune system, either due to disease (example: diabetes, cancer, human immunodeficiency virus [HIV], etc.), or due to medications or treatments (example: therapies used to treat cancer and rheumatological diseases). However, even if you do not have one on these, it can still happen. If you have any of these conditions, or take one of these drugs, make sure to notify your treating physician. Nerve Damage: This is more common when the treatment is an invasive    one, but it can also happen with the use of medications, such as those used in the treatment of cancer. The damage can occur to small secondary nerves, or to large primary ones, such as those in the spinal cord and brain. This damage may be temporary or permanent and it may lead to impairments that can range from temporary numbness to permanent paralysis and/or brain death. Allergic Reactions: Any time a substance or material comes in contact with our body, there is the possibility of an allergic reaction. These can range from a mild skin rash (contact dermatitis) to a severe systemic reaction (anaphylactic reaction), which can result in death. Death: In general, any medical intervention can result in death, most of the time due to an unforeseen complication. ____________________________________________________________________________________________  

## 2024-07-11 ENCOUNTER — Ambulatory Visit
Admission: RE | Admit: 2024-07-11 | Discharge: 2024-07-11 | Disposition: A | Discharge status: Home / Self Care | Source: Ambulatory Visit | Attending: Student in an Organized Health Care Education/Training Program | Admitting: Student in an Organized Health Care Education/Training Program

## 2024-07-11 ENCOUNTER — Ambulatory Visit (HOSPITAL_BASED_OUTPATIENT_CLINIC_OR_DEPARTMENT_OTHER): Admitting: Student in an Organized Health Care Education/Training Program

## 2024-07-11 ENCOUNTER — Encounter: Payer: Self-pay | Admitting: Student in an Organized Health Care Education/Training Program

## 2024-07-11 VITALS — BP 138/96 | HR 72 | Temp 97.3°F | Resp 15 | Ht 68.0 in | Wt 163.0 lb

## 2024-07-11 DIAGNOSIS — G6289 Other specified polyneuropathies: Secondary | ICD-10-CM | POA: Diagnosis not present

## 2024-07-11 DIAGNOSIS — G894 Chronic pain syndrome: Secondary | ICD-10-CM

## 2024-07-11 DIAGNOSIS — G588 Other specified mononeuropathies: Secondary | ICD-10-CM

## 2024-07-11 MED ORDER — LIDOCAINE HCL 2 % IJ SOLN
20.0000 mL | Freq: Once | INTRAMUSCULAR | Status: AC
  Administered 2024-07-11: 100 mg

## 2024-07-11 MED ORDER — METHYLPREDNISOLONE ACETATE 80 MG/ML IJ SUSP
INTRAMUSCULAR | Status: AC
  Filled 2024-07-11: qty 1

## 2024-07-11 MED ORDER — ROPIVACAINE HCL 2 MG/ML IJ SOLN
INTRAMUSCULAR | Status: AC
  Filled 2024-07-11: qty 20

## 2024-07-11 MED ORDER — IOHEXOL 180 MG/ML  SOLN
INTRAMUSCULAR | Status: AC
  Filled 2024-07-11: qty 20

## 2024-07-11 MED ORDER — DEXAMETHASONE SOD PHOSPHATE PF 10 MG/ML IJ SOLN
20.0000 mg | Freq: Once | INTRAMUSCULAR | Status: AC
  Administered 2024-07-11: 20 mg

## 2024-07-11 MED ORDER — LIDOCAINE HCL (PF) 2 % IJ SOLN
INTRAMUSCULAR | Status: AC
  Filled 2024-07-11: qty 10

## 2024-07-11 MED ORDER — ROPIVACAINE HCL 2 MG/ML IJ SOLN
18.0000 mL | Freq: Once | INTRAMUSCULAR | Status: AC
  Administered 2024-07-11: 18 mL via PERINEURAL

## 2024-07-11 NOTE — Progress Notes (Signed)
 Safety precautions to be maintained throughout the outpatient stay will include: orient to surroundings, keep bed in low position, maintain call bell within reach at all times, provide assistance with transfer out of bed and ambulation.

## 2024-07-11 NOTE — Progress Notes (Signed)
 PROVIDER NOTE: Interpretation of information contained herein should be left to medically-trained personnel. Specific patient instructions are provided elsewhere under Patient Instructions section of medical record. This document was created in part using STT-dictation technology, any transcriptional errors that may result from this process are unintentional.  Patient: Sarah [name] Type: Established DOB: 1932/05/27 MRN: [id number] PCP: Zachary Idelia LABOR, MD  Service: Procedure DOS: 07/11/2024 Setting: Ambulatory Location: Ambulatory outpatient facility Delivery: Face-to-face Provider: Wallie Sherry, MD Specialty: Interventional Pain Management Specialty designation: 09 Location: Outpatient facility Ref. Prov.: George, Sionne A, MD       Interventional Therapy  Procedure bilateral cluneal nerve block 2  Imaging: Fluoroscopy-guided Non-spinal ([id number]) Anesthesia: Local anesthesia (1-2% Lidocaine ) DOS: 07/11/2024  Performed by: Wallie Sherry, MD  Purpose: Diagnostic/Therapeutic Rationale (medical necessity): procedure needed and proper for the diagnosis and/or treatment of Sarah [name]'s medical symptoms and needs. 1. Cluneal neuropathy   2. Chronic pain syndrome     NAS-11 Pain score:   Pre-procedure: 10-Worst pain ever/10   Post-procedure: 5/10      Position / Prep / Materials:  Position: Prone  Prep solution: ChloraPrep (2% chlorhexidine gluconate and 70% isopropyl alcohol) Prep Area: Entire posterior lumbosacral area  Materials:  Tray: Block Needle(s):  Type: Spinal  Gauge (G): 22  Length: 3.5-in Qty: One (1) per procedure side.  H&P (Pre-op Assessment):  Sarah [name] is a 89 y.o. (year old), female patient, seen today for interventional treatment. She  has a past surgical history that includes Abdominal hysterectomy; Breast biopsy (Bilateral); Breast excisional biopsy (Right); Knee arthroscopy w/ lateral retinacular repair; Esophagogastroduodenoscopy (N/A,  03/11/2024); and Esophageal dilation (03/11/2024). Sarah [name] has a current medication list which includes the following prescription(s): acetaminophen , alendronate, apoaequorin, beta carotene w/minerals, d 1000, cyanocobalamin, donepezil, duloxetine , fexofenadine, levothyroxine , lumigan, melatonin, multiple vitamins-minerals, polyethylene glycol powder, pregabalin , and probiotic (lactobacillus). Her primarily concern today is the Hip Pain  Initial Vital Signs:  Pulse/HCG Rate: 72ECG Heart Rate: 66 Temp: (!) 97.3 F (36.3 C) Resp: 16 BP: (!) 151/92 SpO2: 98 %  BMI: Estimated body mass index is 24.78 kg/m as calculated from the following:   Height as of this encounter: 5' 8 (1.727 m).   Weight as of this encounter: 163 lb (73.9 kg).  Risk Assessment: Allergies: Reviewed. She is allergic to cholecalciferol, macrobid  [nitrofurantoin ], butrans  [buprenorphine ], codeine, hydrocodone, oxycodone , peanut oil, peanut-containing drug products, penicillins, and tramadol.  Allergy Precautions: None required Coagulopathies: Reviewed. None identified.  Blood-thinner therapy: None at this time Active Infection(s): Reviewed. None identified. Sarah [name] is afebrile  Site Confirmation: Sarah [name] was asked to confirm the procedure and laterality before marking the site Procedure checklist: Completed Consent: Before the procedure and under the influence of no sedative(s), amnesic(s), or anxiolytics, the patient was informed of the treatment options, risks and possible complications. To fulfill our ethical and legal obligations, as recommended by the American Medical Association's Code of Ethics, I have informed the patient of my clinical impression; the nature and purpose of the treatment or procedure; the risks, benefits, and possible complications of the intervention; the alternatives, including doing nothing; the risk(s) and benefit(s) of the alternative treatment(s) or procedure(s); and the risk(s) and  benefit(s) of doing nothing. The patient was provided information about the general risks and possible complications associated with the procedure. These may include, but are not limited to: failure to achieve desired goals, infection, bleeding, organ or nerve damage, allergic reactions, paralysis, and death. In addition, the patient was informed of those risks and complications  associated to the procedure, such as failure to decrease pain; infection; bleeding; organ or nerve damage with subsequent damage to sensory, motor, and/or autonomic systems, resulting in permanent pain, numbness, and/or weakness of one or several areas of the body; allergic reactions; (i.e.: anaphylactic reaction); and/or death. Furthermore, the patient was informed of those risks and complications associated with the medications. These include, but are not limited to: allergic reactions (i.e.: anaphylactic or anaphylactoid reaction(s)); adrenal axis suppression; blood sugar elevation that in diabetics may result in ketoacidosis or comma; water retention that in patients with history of congestive heart failure may result in shortness of breath, pulmonary edema, and decompensation with resultant heart failure; weight gain; swelling or edema; medication-induced neural toxicity; particulate matter embolism and blood vessel occlusion with resultant organ, and/or nervous system infarction; and/or aseptic necrosis of one or more joints. Finally, the patient was informed that Medicine is not an exact science; therefore, there is also the possibility of unforeseen or unpredictable risks and/or possible complications that may result in a catastrophic outcome. The patient indicated having understood very clearly. We have given the patient no guarantees and we have made no promises. Enough time was given to the patient to ask questions, all of which were answered to the patient's satisfaction. Sarah [name] has indicated that she wanted to continue  with the procedure. Attestation: I, the ordering provider, attest that I have discussed with the patient the benefits, risks, side-effects, alternatives, likelihood of achieving goals, and potential problems during recovery for the procedure that I have provided informed consent. Date  Time: 07/11/2024 12:34 PM  Pre-Procedure Preparation:  Monitoring: As per clinic protocol. Respiration, ETCO2, SpO2, BP, heart rate and rhythm monitor placed and checked for adequate function Safety Precautions: Patient was assessed for positional comfort and pressure points before starting the procedure. Time-out: I initiated and conducted the Time-out before starting the procedure, as per protocol. The patient was asked to participate by confirming the accuracy of the Time Out information. Verification of the correct person, site, and procedure were performed and confirmed by me, the nursing staff, and the patient. Time-out conducted as per Joint Commission's Universal Protocol (UP.01.01.01). Time: 1318 Start Time: 1318 hrs.  Description/Narrative of Procedure:          Start Time: 1318 hrs.  Rationale (medical necessity): procedure needed and proper for the diagnosis and/or treatment of the patient's medical symptoms and needs.  The skin over the right and left  posterior iliac crest region was prepped with chlorhexidine and draped in a sterile fashion. Using anatomic landmarks and fluoroscopic guidance, a 25-gauge spinal needle was advanced to the region approximately 7-8 cm lateral to midline and 1-2 cm superior to the right & left posterior superior iliac spine (PSIS), consistent with the expected course of the right and left superior cluneal nerve.  After negative aspiration, 10 mL of injectate composed of:  8 mL of 0.2% Ropivacaine , and 2 mL of Decadron  (10 mg/mL) was injected slowly without resistance: 5 cc for the left cluneal nerves, 5 cc for the right cluneal nerves  The needle was withdrawn  and hemostasis achieved. The patient tolerated the procedure well without immediate complications.   Vitals:   07/11/24 1249 07/11/24 1318 07/11/24 1323  BP: (!) 151/92 (!) 154/99 (!) 138/96  Pulse: 72    Resp: 16 18 15   Temp: (!) 97.3 F (36.3 C)    SpO2: 98% 98% 95%  Weight: 163 lb (73.9 kg)    Height: 5' 8 (1.727 m)  End Time: 1326 hrs.  Imaging Guidance (Non-Spinal):          Type of Imaging Technique: Fluoroscopy Guidance (Non-Spinal) Indication(s): Fluoroscopy guidance for needle placement to enhance accuracy in procedures requiring precise needle localization for targeted delivery of medication in or near specific anatomical locations not easily accessible without such real-time imaging assistance. Exposure Time: Please see nurses notes. Contrast: None used. Fluoroscopic Guidance: I was personally present during the use of fluoroscopy. Tunnel Vision Technique used to obtain the best possible view of the target area. Parallax error corrected before commencing the procedure. Direction-depth-direction technique used to introduce the needle under continuous pulsed fluoroscopy. Once target was reached, antero-posterior, oblique, and lateral fluoroscopic projection used confirm needle placement in all planes. Images permanently stored in EMR. Interpretation: No contrast injected. I personally interpreted the imaging intraoperatively. Adequate needle placement confirmed in multiple planes. Permanent images saved into the patient's record.  Post-operative Assessment:  Post-procedure Vital Signs:  Pulse/HCG Rate: 7267 Temp: (!) 97.3 F (36.3 C) Resp: 15 BP: (!) 138/96 SpO2: 95 %  EBL: None  Complications: No immediate post-treatment complications observed by team, or reported by patient.  Note: The patient tolerated the entire procedure well. A repeat set of vitals were taken after the procedure and the patient was kept under observation following institutional policy,  for this type of procedure. Post-procedural neurological assessment was performed, showing return to baseline, prior to discharge. The patient was provided with post-procedure discharge instructions, including a section on how to identify potential problems. Should any problems arise concerning this procedure, the patient was given instructions to immediately contact us , at any time, without hesitation. In any case, we plan to contact the patient by telephone for a follow-up status report regarding this interventional procedure.  Comments:  No additional relevant information.  Plan of Care (POC)  Orders:  Orders Placed This Encounter  Procedures   DG PAIN CLINIC C-ARM 1-60 MIN NO REPORT    Intraoperative interpretation by procedural physician at [facility].    Standing Status:   Standing    Number of Occurrences:   1    Reason for exam::   Assistance in needle guidance and placement for procedures requiring needle placement in or near specific anatomical locations not easily accessible without such assistance.     Medications ordered for procedure: Meds ordered this encounter  Medications   lidocaine  (XYLOCAINE ) 2 % (with pres) injection 400 mg   dexamethasone  (DECADRON ) injection 20 mg   ropivacaine  (PF) 2 mg/mL (0.2%) (NAROPIN ) injection 18 mL   Medications administered: We administered lidocaine , dexamethasone , and ropivacaine  (PF) 2 mg/mL (0.2%).  See the medical record for exact dosing, route, and time of administration.    Follow-up plan:   Return in about 8 weeks (around 09/05/2024) for With Dr. Marcelino for bilateral  lumbar RFA, please schedule before Thanksgiving.SABRA     Recent Visits Date Type Provider Dept  06/27/24 Office Visit Patel, Seema K, NP [facility]  05/10/24 Office Visit Marcelino Nurse, MD [facility]  Showing recent visits within past 90 days and meeting all other requirements Today's Visits Date Type Provider Dept  07/11/24  Procedure visit Marcelino Nurse, MD [facility]  Showing today's visits and meeting all other requirements Future Appointments Date Type Provider Dept  08/10/24 Appointment Marcelino Nurse, MD [facility]  Showing future appointments within next 90 days and meeting all other requirements   Disposition: Discharge home  Discharge (Date  Time): 07/11/2024; 1340  hrs.   Primary Care Physician: Zachary Idelia LABOR, MD Location: [facility] Note by: Wallie Sherry, MD (TTS technology used. I apologize for any typographical errors that were not detected and corrected.) Date: 07/11/2024; Time: 2:04 PM  Disclaimer:  Medicine is not an Visual merchandiser. The only guarantee in medicine is that nothing is guaranteed. It is important to note that the decision to proceed with this intervention was based on the information collected from the patient. The Data and conclusions were drawn from the patient's questionnaire, the interview, and the physical examination. Because the information was provided in large part by the patient, it cannot be guaranteed that it has not been purposely or unconsciously manipulated. Every effort has been made to obtain as much relevant data as possible for this evaluation. It is important to note that the conclusions that lead to this procedure are derived in large part from the available data. Always take into account that the treatment will also be dependent on availability of resources and existing treatment guidelines, considered by other Pain Management Practitioners as being common knowledge and practice, at the time of the intervention. For Medico-Legal purposes, it is also important to point out that variation in procedural techniques and pharmacological choices are the acceptable norm. The indications, contraindications, technique, and results of the above procedure should only be interpreted and judged by a Board-Certified Interventional  Pain Specialist with extensive familiarity and expertise in the same exact procedure and technique.

## 2024-07-11 NOTE — Patient Instructions (Signed)

## 2024-07-12 ENCOUNTER — Telehealth: Payer: Self-pay | Admitting: *Deleted

## 2024-07-12 NOTE — Telephone Encounter (Signed)
 No problems post procedure.

## 2024-07-13 ENCOUNTER — Ambulatory Visit: Admitting: Physician Assistant

## 2024-07-13 ENCOUNTER — Encounter: Payer: Self-pay | Admitting: Physician Assistant

## 2024-07-13 VITALS — BP 112/70 | HR 84 | Ht 68.0 in | Wt 164.0 lb

## 2024-07-13 DIAGNOSIS — N390 Urinary tract infection, site not specified: Secondary | ICD-10-CM

## 2024-07-13 DIAGNOSIS — N3281 Overactive bladder: Secondary | ICD-10-CM | POA: Diagnosis not present

## 2024-07-13 LAB — MICROSCOPIC EXAMINATION: Bacteria, UA: NONE SEEN

## 2024-07-13 LAB — URINALYSIS, COMPLETE
Bilirubin, UA: NEGATIVE
Glucose, UA: NEGATIVE
Ketones, UA: NEGATIVE
Leukocytes,UA: NEGATIVE
Nitrite, UA: NEGATIVE
Protein,UA: NEGATIVE
RBC, UA: NEGATIVE
Specific Gravity, UA: 1.015 (ref 1.005–1.030)
Urobilinogen, Ur: 0.2 mg/dL (ref 0.2–1.0)
pH, UA: 6 (ref 5.0–7.5)

## 2024-07-13 LAB — BLADDER SCAN AMB NON-IMAGING

## 2024-07-13 MED ORDER — TROSPIUM CHLORIDE ER 60 MG PO CP24: 1.0000 | ORAL_CAPSULE | Freq: Every day | ORAL | 11 refills | Status: AC

## 2024-07-13 NOTE — Progress Notes (Signed)
 07/13/2024 3:40 PM   Sarah [name] 1932/05/23 [id number]  CC: Chief Complaint  Patient presents with   Over Active Bladder   HPI: Sarah [name] is a 89 y.o. female with PMH OSA not on CPAP, recurrent UTI previously on suppressive trimethoprim  and cranberry supplements, OAB with nocturia previously on Myrbetriq  50 mg who discontinued PTNS after 12 treatments earlier this year, and GSM on vaginal estrogen cream who presents today for evaluation of frequency and nocturia.   Today she reports she does not know why she stopped PTNS earlier this year.  She feels it was helping her and she would like to restart it.  She describes bothersome daytime frequency every 30 minutes and nocturia x 6-7.  She has been off CPAP for about a year.  She is no longer on a beta 3 agonist.  She is also off suppressive trimethoprim  for an unknown duration, though she has remained UTI free.  She has continued estrogen cream.  She denies urinary leakage.  She has a history of constipation, dry mouth, and dry eye.  She has some cost concerns with beta 3 agonist.  In-office UA and microscopy pan negative. PVR 0mL.  PMH: Past Medical History:  Diagnosis Date   Allergic rhinitis    Benign essential HTN 09/21/2015   Bilateral occipital neuralgia    Cancer (HCC)    Skin CA resected from Right eyelid and both legs.   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     Stage 3a chronic kidney disease (CKD) (HCC)    Thyroid  disease     Surgical History: Past Surgical History:  Procedure Laterality Date   ABDOMINAL HYSTERECTOMY     BREAST BIOPSY Bilateral    neg   BREAST EXCISIONAL BIOPSY Right    ESOPHAGEAL DILATION  03/11/2024   Procedure: DILATION, ESOPHAGUS;  Surgeon: Maryruth Ole DASEN, MD;  Location: [facility];  Service: Endoscopy;;   ESOPHAGOGASTRODUODENOSCOPY N/A 03/11/2024   Procedure: EGD (ESOPHAGOGASTRODUODENOSCOPY);  Surgeon: Maryruth Ole DASEN, MD;  Location: [facility];  Service: Endoscopy;  Laterality: N/A;   KNEE ARTHROSCOPY W/ LATERAL RETINACULAR REPAIR      Home Medications:  Allergies as of 07/13/2024       Reactions   Cholecalciferol Nausea Only   cholecalciferol   Macrobid  [nitrofurantoin ] Shortness Of Breath   Butrans  [buprenorphine ]    Made my whole body hurt   Codeine Other (See Comments)   Makes her disoriented codeine   Hydrocodone Other (See Comments)   Patient states she was seeing things that weren't there and very disoriented.   Oxycodone  Other (See Comments)   hallucinations   Peanut Oil Other (See Comments)   Violent Migraines peanut oil   Peanut-containing Drug Products    Other reaction(s): Headache   Penicillins Nausea Only   Tramadol Other (See Comments)  Dizziness. tramadol        Medication List        Accurate as of July 13, 2024  3:40 PM. If you have any questions, ask your nurse or doctor.          acetaminophen  500 MG tablet Commonly known as: TYLENOL  Take 1,000 mg by mouth every 6 (six) hours as needed.   alendronate 70 MG tablet Commonly known as: FOSAMAX Take 70 mg by mouth every 7 (seven) days.   amoxicillin  500 MG capsule Commonly known as: AMOXIL  SMARTSIG:4 Capsule(s) By Mouth   baclofen 10 MG tablet Commonly known as: LIORESAL TAKE 1 TABLET (10 MG TOTAL) BY MOUTH TWICE A DAY   beta carotene w/minerals tablet Take 1 tablet by mouth daily.    PRESERVISION AREDS 2 PO Take by mouth.   cyanocobalamin 1000 MCG tablet Commonly known as: VITAMIN B12 Take 1,000 mcg by mouth.   D 1000 25 MCG (1000 UT) capsule Generic drug: Cholecalciferol Take 1,000 Units by mouth.   donepezil 10 MG disintegrating tablet Commonly known as: ARICEPT ODT Take 10 mg by mouth. What changed: Another medication with the same name was removed. Continue taking this medication, and follow the directions you see here. Changed by: Randell Detter   DULoxetine  60 MG capsule Commonly known as: CYMBALTA  Take 60 mg by mouth daily.   fexofenadine 180 MG tablet Commonly known as: ALLEGRA Take 180 mg by mouth daily.   latanoprost 0.005 % ophthalmic solution Commonly known as: XALATAN 1 drop daily.   levothyroxine  125 MCG tablet Commonly known as: SYNTHROID  Take 125 mcg by mouth daily. What changed: Another medication with the same name was changed. Make sure you understand how and when to take each.   levothyroxine  100 MCG tablet Commonly known as: SYNTHROID  Take 100 mcg by mouth daily. What changed: how much to take   lovastatin 20 MG tablet Commonly known as: MEVACOR Take 20 mg by mouth.   Lumigan 0.01 % Soln Generic drug: bimatoprost Place 1 drop into both eyes at bedtime.   melatonin 1 MG Tabs tablet Take 10 mg by mouth once.   methylPREDNISolone  4 MG Tbpk tablet Commonly known as: MEDROL  DOSEPAK Take by mouth.   polyethylene glycol powder 17 GM/SCOOP powder Commonly known as: GLYCOLAX/MIRALAX Take by mouth.   pregabalin  75 MG capsule Commonly known as: LYRICA  Take 75 mg by mouth 3 (three) times daily.   PREVAGEN PO Take 75 mg by mouth in the morning, at noon, and at bedtime.   Probiotic (Lactobacillus) Caps Take by mouth.   triamcinolone 0.025 % cream Commonly known as: KENALOG Apply topically 2 (two) times daily.        Allergies:  Allergies  Allergen Reactions   Cholecalciferol Nausea Only     cholecalciferol   Macrobid  [Nitrofurantoin ] Shortness Of Breath   Butrans  [Buprenorphine ]     Made my whole body hurt   Codeine Other (See Comments)    Makes her disoriented  codeine   Hydrocodone Other (See Comments)    Patient states she was seeing things that weren't there and very disoriented.   Oxycodone  Other (See Comments)    hallucinations   Peanut Oil Other (See Comments)    Violent Migraines  peanut oil   Peanut-Containing Drug Products     Other reaction(s): Headache   Penicillins Nausea Only   Tramadol Other (See Comments)    Dizziness.  tramadol    Family History: Family History  Problem Relation Age of Onset  Breast cancer Mother 54   Breast cancer Maternal Uncle 60   Breast cancer Maternal Aunt        mat great aunt   Breast cancer Maternal Aunt 73   Tracheal cancer Father    Cancer Sister    Brain cancer Sister    Cancer Brother     Social History:   reports that she has never smoked. She has never used smokeless tobacco. She reports that she does not drink alcohol and does not use drugs.  Physical Exam: BP 112/70   Pulse 84   Ht 5' 8 (1.727 m)   Wt 164 lb (74.4 kg)   BMI 24.94 kg/m   Constitutional:  Alert and oriented, no acute distress, nontoxic appearing HEENT: [place], AT Cardiovascular: No clubbing, cyanosis, or edema Respiratory: Normal respiratory effort, no increased work of breathing Skin: No rashes, bruises or suspicious lesions Neurologic: Grossly intact, no focal deficits, moving all 4 extremities Psychiatric: Normal mood and affect  Laboratory Data: Results for orders placed or performed in visit on 07/13/24  Microscopic Examination   Collection Time: 07/13/24  3:18 PM   Urine  Result Value Ref Range   WBC, UA 0-5 0 - 5 /hpf   RBC, Urine 0-2 0 - 2 /hpf   Epithelial Cells (non renal) 0-10 0 - 10 /hpf   Bacteria, UA None seen None seen/Few  Urinalysis, Complete   Collection Time: 07/13/24  3:18 PM  Result Value Ref Range    Specific Gravity, UA 1.015 1.005 - 1.030   pH, UA 6.0 5.0 - 7.5   Color, UA Yellow Yellow   Appearance Ur Clear Clear   Leukocytes,UA Negative Negative   Protein,UA Negative Negative/Trace   Glucose, UA Negative Negative   Ketones, UA Negative Negative   RBC, UA Negative Negative   Bilirubin, UA Negative Negative   Urobilinogen, Ur 0.2 0.2 - 1.0 mg/dL   Nitrite, UA Negative Negative   Microscopic Examination Comment    Microscopic Examination See below:   Bladder Scan (Post Void Residual) in office   Collection Time: 07/13/24  4:03 PM  Result Value Ref Range   Scan Result 0ml    Assessment & Plan:   1. OAB (overactive bladder) (Primary) Bothersome symptoms off pharmacotherapy and PTNS.  She wishes to resume PTNS.  We discussed that her insurance will only allow 45 lifetime treatments, and we will need to start back at the beginning so she will run through these faster.  She understands and wishes to proceed.  UA bland and she is emptying appropriately.  Will also put her back on medications, will start with trospium  ER 60 mg as it looks like her insurance does not cover beta 3 agonist. - Urinalysis, Complete - Bladder Scan (Post Void Residual) in office - Trospium  Chloride 60 MG CP24; Take 1 capsule (60 mg total) by mouth daily.  Dispense: 30 capsule; Refill: 11  2. Recurrent UTI No UTIs off suppressive trimethoprim  for an unclear duration.  I advised her to continue estrogen cream.  Will continue to monitor.  Return for Will call to schedule PTNS.  Lucie Hones, PA-C  [facility] [place] [address] [place], [place] [address] [phone]

## 2024-08-02 ENCOUNTER — Encounter: Admitting: Student in an Organized Health Care Education/Training Program

## 2024-08-04 NOTE — Progress Notes (Addendum)
 PTNS  Session # 1 (lifetime #13)  Health & Social Factors: no change Caffeine: 1 Alcohol: 0 Daytime voids #per day: 10-14 Night-time voids #per night: 4-5 Urgency: mild Incontinence Episodes #per day: 0 Ankle used: right Treatment Setting: 12  Feeling/ Response: sensory Comments: Consent re-signed.  Performed By: Mathew Pinal, RN  Follow Up: 1 week

## 2024-08-08 ENCOUNTER — Ambulatory Visit (INDEPENDENT_AMBULATORY_CARE_PROVIDER_SITE_OTHER): Admitting: Physician Assistant

## 2024-08-08 DIAGNOSIS — N3281 Overactive bladder: Secondary | ICD-10-CM | POA: Diagnosis not present

## 2024-08-08 NOTE — Patient Instructions (Signed)

## 2024-08-10 ENCOUNTER — Ambulatory Visit
Admission: RE | Admit: 2024-08-10 | Discharge: 2024-08-10 | Disposition: A | Discharge status: Home / Self Care | Source: Ambulatory Visit | Attending: Student in an Organized Health Care Education/Training Program | Admitting: Student in an Organized Health Care Education/Training Program

## 2024-08-10 ENCOUNTER — Ambulatory Visit (HOSPITAL_BASED_OUTPATIENT_CLINIC_OR_DEPARTMENT_OTHER): Admitting: Student in an Organized Health Care Education/Training Program

## 2024-08-10 ENCOUNTER — Encounter: Payer: Self-pay | Admitting: Student in an Organized Health Care Education/Training Program

## 2024-08-10 VITALS — BP 151/87 | HR 71 | Temp 98.0°F | Resp 12 | Ht 68.0 in | Wt 164.0 lb

## 2024-08-10 DIAGNOSIS — M47816 Spondylosis without myelopathy or radiculopathy, lumbar region: Secondary | ICD-10-CM | POA: Insufficient documentation

## 2024-08-10 MED ORDER — ROPIVACAINE HCL 2 MG/ML IJ SOLN
INTRAMUSCULAR | Status: AC
  Filled 2024-08-10: qty 20

## 2024-08-10 MED ORDER — DEXAMETHASONE SOD PHOSPHATE PF 10 MG/ML IJ SOLN
20.0000 mg | Freq: Once | INTRAMUSCULAR | Status: AC
  Administered 2024-08-10: 20 mg

## 2024-08-10 MED ORDER — LIDOCAINE HCL 2 % IJ SOLN
INTRAMUSCULAR | Status: AC
  Filled 2024-08-10: qty 20

## 2024-08-10 MED ORDER — LIDOCAINE HCL 2 % IJ SOLN
20.0000 mL | Freq: Once | INTRAMUSCULAR | Status: AC
  Administered 2024-08-10: 400 mg

## 2024-08-10 MED ORDER — ROPIVACAINE HCL 2 MG/ML IJ SOLN
18.0000 mL | Freq: Once | INTRAMUSCULAR | Status: AC
  Administered 2024-08-10: 18 mL via PERINEURAL

## 2024-08-10 NOTE — Progress Notes (Signed)
 PROVIDER NOTE: Interpretation of information contained herein should be left to medically-trained personnel. Specific patient instructions are provided elsewhere under Patient Instructions section of medical record. This document was created in part using STT-dictation technology, any transcriptional errors that may result from this process are unintentional.  Patient: Sarah [name] Type: Established DOB: [birth year] MRN: [id number] PCP: Zachary Idelia LABOR, MD  Service: Procedure DOS: 08/10/2024 Setting: Ambulatory Location: Ambulatory outpatient facility Delivery: Face-to-face Provider: Wallie Sherry, MD Specialty: Interventional Pain Management Specialty designation: 09 Location: Outpatient facility Ref. Prov.: Zachary Idelia LABOR, MD       Interventional Therapy   Procedure: Lumbar Facet, Medial Branch Radiofrequency Ablation (RFA) #2  Laterality: Bilateral (-50)  Level: L3, L4, and L5 Medial Branch Level(s). These levels will denervate the L3-4 and L4-5 lumbar facet joints.  Imaging: Fluoroscopy-guided         Anesthesia: Local anesthesia (1-2% Lidocaine ) DOS: 08/10/2024  Performed by: Wallie Sherry, MD  Purpose: Therapeutic/Palliative Indications: Low back pain severe enough to impact quality of life or function. Indications: 1. Lumbar facet arthropathy (L4/5, L5/S1)   2. Spondylosis of lumbar joint    Sarah [name] has been dealing with the above chronic pain for longer than three months and has either failed to respond, was unable to tolerate, or simply did not get enough benefit from other more conservative therapies including, but not limited to: 1. Over-the-counter medications 2. Anti-inflammatory medications 3. Muscle relaxants 4. Membrane stabilizers 5. Opioids 6. Physical therapy and/or chiropractic manipulation 7. Modalities (Heat, ice, etc.) 8. Invasive techniques such as nerve blocks. Sarah [name] has attained more than 50% relief of the pain from a series of  diagnostic injections conducted in separate occasions.  Pain Score: Pre-procedure: 4 /10 Post-procedure: 2/10     Position / Prep / Materials:  Position: Prone  Prep solution: DuraPrep (Iodine Povacrylex [0.7% available iodine] and Isopropyl Alcohol, 74% w/w) Prep Area: Entire Lumbosacral Region (Lower back from mid-thoracic region to end of tailbone and from flank to flank.) Materials:  Tray: RFA (Radiofrequency) tray Needle(s):  Type: RFA (Teflon-coated radiofrequency ablation needles) Gauge (G): 22  Length: Regular (10cm) Qty: 6      H&P (Pre-op Assessment):  Sarah [name] is a 89 y.o. (year old), female patient, seen today for interventional treatment. She  has a past surgical history that includes Abdominal hysterectomy; Breast biopsy (Bilateral); Breast excisional biopsy (Right); Knee arthroscopy w/ lateral retinacular repair; Esophagogastroduodenoscopy (N/A, 03/11/2024); and Esophageal dilation (03/11/2024). Sarah [name] has a current medication list which includes the following prescription(s): acetaminophen , alendronate, amoxicillin , apoaequorin, baclofen, beta carotene w/minerals, d 1000, cyanocobalamin, donepezil, duloxetine , fexofenadine, latanoprost, levothyroxine , levothyroxine , lovastatin, lumigan, melatonin, methylprednisolone , multiple vitamins-minerals, polyethylene glycol powder, pregabalin , probiotic (lactobacillus), triamcinolone, and trospium  chloride. Her primarily concern today is the Back Pain and Leg Pain (left)  Initial Vital Signs:  Pulse/HCG Rate: 71ECG Heart Rate: 73 Temp: 98 F (36.7 C) Resp: 18 BP: (!) 143/4 SpO2: 100 %  BMI: Estimated body mass index is 24.94 kg/m as calculated from the following:   Height as of this encounter: 5' 8 (1.727 m).   Weight as of this encounter: 164 lb (74.4 kg).  Risk Assessment: Allergies: Reviewed. She is allergic to cholecalciferol, macrobid  [nitrofurantoin ], butrans  [buprenorphine ], codeine, hydrocodone, oxycodone ,  peanut oil, peanut-containing drug products, penicillins, and tramadol.  Allergy Precautions: None required Coagulopathies: Reviewed. None identified.  Blood-thinner therapy: None at this time Active Infection(s): Reviewed. None identified. Sarah [name] is afebrile  Site Confirmation: Sarah [name] was asked to confirm the  procedure and laterality before marking the site Procedure checklist: Completed Consent: Before the procedure and under the influence of no sedative(s), amnesic(s), or anxiolytics, the patient was informed of the treatment options, risks and possible complications. To fulfill our ethical and legal obligations, as recommended by the American Medical Association's Code of Ethics, I have informed the patient of my clinical impression; the nature and purpose of the treatment or procedure; the risks, benefits, and possible complications of the intervention; the alternatives, including doing nothing; the risk(s) and benefit(s) of the alternative treatment(s) or procedure(s); and the risk(s) and benefit(s) of doing nothing. The patient was provided information about the general risks and possible complications associated with the procedure. These may include, but are not limited to: failure to achieve desired goals, infection, bleeding, organ or nerve damage, allergic reactions, paralysis, and death. In addition, the patient was informed of those risks and complications associated to Spine-related procedures, such as failure to decrease pain; infection (i.e.: Meningitis, epidural or intraspinal abscess); bleeding (i.e.: epidural hematoma, subarachnoid hemorrhage, or any other type of intraspinal or peri-dural bleeding); organ or nerve damage (i.e.: Any type of peripheral nerve, nerve root, or spinal cord injury) with subsequent damage to sensory, motor, and/or autonomic systems, resulting in permanent pain, numbness, and/or weakness of one or several areas of the body; allergic reactions; (i.e.:  anaphylactic reaction); and/or death. Furthermore, the patient was informed of those risks and complications associated with the medications. These include, but are not limited to: allergic reactions (i.e.: anaphylactic or anaphylactoid reaction(s)); adrenal axis suppression; blood sugar elevation that in diabetics may result in ketoacidosis or comma; water retention that in patients with history of congestive heart failure may result in shortness of breath, pulmonary edema, and decompensation with resultant heart failure; weight gain; swelling or edema; medication-induced neural toxicity; particulate matter embolism and blood vessel occlusion with resultant organ, and/or nervous system infarction; and/or aseptic necrosis of one or more joints. Finally, the patient was informed that Medicine is not an exact science; therefore, there is also the possibility of unforeseen or unpredictable risks and/or possible complications that may result in a catastrophic outcome. The patient indicated having understood very clearly. We have given the patient no guarantees and we have made no promises. Enough time was given to the patient to ask questions, all of which were answered to the patient's satisfaction. Ms. Devoto has indicated that she wanted to continue with the procedure. Attestation: I, the ordering provider, attest that I have discussed with the patient the benefits, risks, side-effects, alternatives, likelihood of achieving goals, and potential problems during recovery for the procedure that I have provided informed consent. Date  Time: 08/10/2024  9:20 AM   Pre-Procedure Preparation:  Monitoring: As per clinic protocol. Respiration, ETCO2, SpO2, BP, heart rate and rhythm monitor placed and checked for adequate function Safety Precautions: Patient was assessed for positional comfort and pressure points before starting the procedure. Time-out: I initiated and conducted the Time-out before starting the  procedure, as per protocol. The patient was asked to participate by confirming the accuracy of the Time Out information. Verification of the correct person, site, and procedure were performed and confirmed by me, the nursing staff, and the patient. Time-out conducted as per Joint Commission's Universal Protocol (UP.01.01.01). Time: 1045 Start Time: 1045 hrs.  Description of Procedure:          Laterality: See above. Levels:  See above. Safety Precautions: Aspiration looking for blood return was conducted prior to all injections. At no point  did we inject any substances, as a needle was being advanced. Before injecting, the patient was told to immediately notify me if she was experiencing any new onset of ringing in the ears, or metallic taste in the mouth. No attempts were made at seeking any paresthesias. Safe injection practices and needle disposal techniques used. Medications properly checked for expiration dates. SDV (single dose vial) medications used. After the completion of the procedure, all disposable equipment used was discarded in the proper designated medical waste containers. Local Anesthesia: Protocol guidelines were followed. The patient was positioned over the fluoroscopy table. The area was prepped in the usual manner. The time-out was completed. The target area was identified using fluoroscopy. A 12-in long, straight, sterile hemostat was used with fluoroscopic guidance to locate the targets for each level blocked. Once located, the skin was marked with an approved surgical skin marker. Once all sites were marked, the skin (epidermis, dermis, and hypodermis), as well as deeper tissues (fat, connective tissue and muscle) were infiltrated with a small amount of a short-acting local anesthetic, loaded on a 10cc syringe with a 25G, 1.5-in  Needle. An appropriate amount of time was allowed for local anesthetics to take effect before proceeding to the next step. Technical description of  process:  Radiofrequency Ablation (RFA) L3 Medial Branch Nerve RFA: The target area for the L3 medial branch is at the junction of the postero-lateral aspect of the superior articular process and the superior, posterior, and medial edge of the transverse process of L4. Under fluoroscopic guidance, a Radiofrequency needle was inserted until contact was made with os over the superior postero-lateral aspect of the pedicular shadow (target area). Sensory and motor testing was conducted to properly adjust the position of the needle. Once satisfactory placement of the needle was achieved, the numbing solution was slowly injected after negative aspiration for blood. 2.0 mL of the nerve block solution was injected without difficulty or complication. After waiting for at least 3 minutes, the ablation was performed. Once completed, the needle was removed intact. L4 Medial Branch Nerve RFA: The target area for the L4 medial branch is at the junction of the postero-lateral aspect of the superior articular process and the superior, posterior, and medial edge of the transverse process of L5. Under fluoroscopic guidance, a Radiofrequency needle was inserted until contact was made with os over the superior postero-lateral aspect of the pedicular shadow (target area). Sensory and motor testing was conducted to properly adjust the position of the needle. Once satisfactory placement of the needle was achieved, the numbing solution was slowly injected after negative aspiration for blood. 2.0 mL of the nerve block solution was injected without difficulty or complication. After waiting for at least 3 minutes, the ablation was performed. Once completed, the needle was removed intact. L5 Medial Branch Nerve RFA: The target area for the L5 medial branch is at the junction of the postero-lateral aspect of the superior articular process of S1 and the superior, posterior, and medial edge of the sacral ala. Under fluoroscopic guidance, a  Radiofrequency needle was inserted until contact was made with os over the superior postero-lateral aspect of the pedicular shadow (target area). Sensory and motor testing was conducted to properly adjust the position of the needle. Once satisfactory placement of the needle was achieved, the numbing solution was slowly injected after negative aspiration for blood. 2.0 mL of the nerve block solution was injected without difficulty or complication. After waiting for at least 3 minutes, the ablation was performed. Once  completed, the needle was removed intact.  12 cc solution made of 10 cc of 0.2% ropivacaine , 2 cc of Decadron  10 mg/cc. 2cc injected at each level above after sensorimotor testing, prior to lesioning   Radiofrequency lesioning (ablation):  Radiofrequency Generator: Medtronic AccurianTM AG 1000 RF Generator Sensory Stimulation Parameters: 50 Hz was used to locate & identify the nerve, making sure that the needle was positioned such that there was no sensory stimulation below 0.3 V or above 0.7 V. Motor Stimulation Parameters: 2 Hz was used to evaluate the motor component. Care was taken not to lesion any nerves that demonstrated motor stimulation of the lower extremities at an output of less than 2.5 times that of the sensory threshold, or a maximum of 2.0 V. Lesioning Technique Parameters: Standard Radiofrequency settings. (Not bipolar or pulsed.) Temperature Settings: 80 degrees C Lesioning time: 60 seconds Stationary intra-operative compliance: Compliant  Once the entire procedure was completed, the treated area was cleaned, making sure to leave some of the prepping solution back to take advantage of its long term bactericidal properties.    Illustration of the posterior view of the lumbar spine and the posterior neural structures. Laminae of L2 through S1 are labeled. DPRL5, dorsal primary ramus of L5; DPRS1, dorsal primary ramus of S1; DPR3, dorsal primary ramus of L3; FJ, facet  (zygapophyseal) joint L3-L4; I, inferior articular process of L4; LB1, lateral branch of dorsal primary ramus of L1; IAB, inferior articular branches from L3 medial branch (supplies L4-L5 facet joint); IBP, intermediate branch plexus; MB3, medial branch of dorsal primary ramus of L3; NR3, third lumbar nerve root; S, superior articular process of L5; SAB, superior articular branches from L4 (supplies L4-5 facet joint also); TP3, transverse process of L3.  Facet Joint Innervation (* possible contribution)  L1-2 T12, L1 (L2*)  Medial Branch  L2-3 L1, L2 (L3*)                     L3-4 L2, L3 (L4*)                     L4-5 L3, L4 (L5*)                     L5-S1 L4, L5, S1                        Vitals:   08/10/24 1045 08/10/24 1051 08/10/24 1055 08/10/24 1100  BP: (!) 159/93 (!) 162/90 (!) 158/94 (!) 151/87  Pulse:      Resp: 17 16 12 12   Temp:      SpO2: 97% 100% 97% 96%  Weight:      Height:        Start Time: 1045 hrs. End Time: 1103 hrs.  Imaging Guidance (Spinal):          Type of Imaging Technique: Fluoroscopy Guidance (Spinal) Indication(s): Assistance in needle guidance and placement for procedures requiring needle placement in or near specific anatomical locations not easily accessible without such assistance. Exposure Time: Please see nurses notes. Contrast: None used. Fluoroscopic Guidance: I was personally present during the use of fluoroscopy. Tunnel Vision Technique used to obtain the best possible view of the target area. Parallax error corrected before commencing the procedure. Direction-depth-direction technique used to introduce the needle under continuous pulsed fluoroscopy. Once target was reached, antero-posterior, oblique, and lateral fluoroscopic projection used confirm needle placement in all planes. Images permanently stored in EMR.  Interpretation: No contrast injected. I personally interpreted the imaging intraoperatively. Adequate needle placement  confirmed in multiple planes. Permanent images saved into the patient's record.  Antibiotic Prophylaxis:   Anti-infectives (From admission, onward)    None      Indication(s): None identified  Post-operative Assessment:  Post-procedure Vital Signs:  Pulse/HCG Rate: 7173 Temp: 98 F (36.7 C) Resp: 12 BP: (!) 151/87 SpO2: 96 %  EBL: None  Complications: No immediate post-treatment complications observed by team, or reported by patient.  Note: The patient tolerated the entire procedure well. A repeat set of vitals were taken after the procedure and the patient was kept under observation following institutional policy, for this type of procedure. Post-procedural neurological assessment was performed, showing return to baseline, prior to discharge. The patient was provided with post-procedure discharge instructions, including a section on how to identify potential problems. Should any problems arise concerning this procedure, the patient was given instructions to immediately contact us , at any time, without hesitation. In any case, we plan to contact the patient by telephone for a follow-up status report regarding this interventional procedure.  Comments:  No additional relevant information.  Plan of Care (POC)  Orders:  Orders Placed This Encounter  Procedures   DG PAIN [facility] C-ARM 1-60 MIN NO REPORT    Intraoperative interpretation by procedural physician at [facility].    Standing Status:   Standing    Number of Occurrences:   1    Reason for exam::   Assistance in needle guidance and placement for procedures requiring needle placement in or near specific anatomical locations not easily accessible without such assistance.    Medications ordered for procedure: Meds ordered this encounter  Medications   lidocaine  (XYLOCAINE ) 2 % (with pres) injection 400 mg   ropivacaine  (PF) 2 mg/mL (0.2%) (NAROPIN ) injection 18 mL   dexamethasone  (DECADRON ) injection 20 mg    Medications administered: We administered lidocaine , ropivacaine  (PF) 2 mg/mL (0.2%), and dexamethasone .  See the medical record for exact dosing, route, and time of administration.  Follow-up plan:   Return in about 8 weeks (around 10/05/2024) for PPE, F2F.       Recent Visits Date Type Provider Dept  07/11/24 Procedure visit Marcelino Nurse, MD [facility]  06/27/24 Office Visit Patel, Seema K, NP [facility]  Showing recent visits within past 90 days and meeting all other requirements Today's Visits Date Type Provider Dept  08/10/24 Procedure visit Marcelino Nurse, MD [facility]  Showing today's visits and meeting all other requirements Future Appointments Date Type Provider Dept  10/06/24 Appointment Marcelino Nurse, MD [facility]  Showing future appointments within next 90 days and meeting all other requirements  Disposition: Discharge home  Discharge (Date  Time): 08/10/2024; 1110 hrs.   Primary Care Physician: Zachary Idelia LABOR, MD Location: [facility] Note by: Nurse Marcelino, MD (TTS technology used. I apologize for any typographical errors that were not detected and corrected.) Date: 08/10/2024; Time: 11:18 AM  Disclaimer:  Medicine is not an visual merchandiser. The only guarantee in medicine is that nothing is guaranteed. It is important to note that the decision to proceed with this intervention was based on the information collected from the patient. The Data and conclusions were drawn from the patient's questionnaire, the interview, and the physical examination. Because the information was provided in large part by the patient, it cannot be guaranteed that it has not been purposely or unconsciously manipulated. Every effort  has been made to obtain as much relevant data as possible for this evaluation. It is important to note that the conclusions that lead to this procedure are derived in large part from the  available data. Always take into account that the treatment will also be dependent on availability of resources and existing treatment guidelines, considered by other Pain Management Practitioners as being common knowledge and practice, at the time of the intervention. For Medico-Legal purposes, it is also important to point out that variation in procedural techniques and pharmacological choices are the acceptable norm. The indications, contraindications, technique, and results of the above procedure should only be interpreted and judged by a Board-Certified Interventional Pain Specialist with extensive familiarity and expertise in the same exact procedure and technique.

## 2024-08-10 NOTE — Progress Notes (Signed)
 Safety precautions to be maintained throughout the outpatient stay will include: orient to surroundings, keep bed in low position, maintain call bell within reach at all times, provide assistance with transfer out of bed and ambulation.

## 2024-08-10 NOTE — Patient Instructions (Signed)

## 2024-08-11 ENCOUNTER — Telehealth: Payer: Self-pay | Admitting: *Deleted

## 2024-08-11 NOTE — Telephone Encounter (Signed)
 Post procedure call; voicemail left

## 2024-08-15 ENCOUNTER — Ambulatory Visit: Admitting: Physician Assistant

## 2024-08-15 DIAGNOSIS — N3281 Overactive bladder: Secondary | ICD-10-CM

## 2024-08-15 NOTE — Progress Notes (Signed)
 PTNS  Session # 2  Health & Social Factors: none Caffeine: 1 Alcohol: 0 Daytime voids #per day: 8 Night-time voids #per night: 4 Urgency: mild Incontinence Episodes #per day: one per week Ankle used: right Treatment Setting: 8 Feeling/ Response: Toe flex Comments: n/a  Performed By: Mathew Pinal, RN  Follow Up: 1 week

## 2024-08-15 NOTE — Patient Instructions (Signed)

## 2024-08-22 ENCOUNTER — Ambulatory Visit (INDEPENDENT_AMBULATORY_CARE_PROVIDER_SITE_OTHER): Admitting: Urology

## 2024-08-22 DIAGNOSIS — N3281 Overactive bladder: Secondary | ICD-10-CM | POA: Diagnosis not present

## 2024-08-22 NOTE — Progress Notes (Signed)
 PTNS  Session # 3 lifetime #15  Health & Social Factors: no change Caffeine: 2 Alcohol: 0 Daytime voids #per day: 7-8 Night-time voids #per night: 3 Urgency: mild Incontinence Episodes #per day: 1 per week Ankle used: right Treatment Setting: 12 Feeling/ Response: sensory Comments: n/a  Performed By: Mathew Cleotilde PEAK  Follow Up: 1 week

## 2024-08-22 NOTE — Patient Instructions (Signed)

## 2024-08-29 ENCOUNTER — Ambulatory Visit (INDEPENDENT_AMBULATORY_CARE_PROVIDER_SITE_OTHER): Admitting: Physician Assistant

## 2024-08-29 DIAGNOSIS — N3281 Overactive bladder: Secondary | ICD-10-CM | POA: Diagnosis not present

## 2024-08-29 NOTE — Patient Instructions (Signed)

## 2024-08-29 NOTE — Progress Notes (Signed)
 PTNS  Session # 4  Health & Social Factors: no change Caffeine: 2 Alcohol: 0 Daytime voids #per day: 7-8 Night-time voids #per night: 4 Urgency: mild Incontinence Episodes #per day: 0 Ankle used: right Treatment Setting: 17 Feeling/ Response: sensory Comments: n/a  Performed By: Mathew Pinal, RN  Follow Up: 1 week

## 2024-09-05 ENCOUNTER — Ambulatory Visit (INDEPENDENT_AMBULATORY_CARE_PROVIDER_SITE_OTHER): Admitting: Physician Assistant

## 2024-09-05 DIAGNOSIS — N3281 Overactive bladder: Secondary | ICD-10-CM

## 2024-09-05 NOTE — Progress Notes (Signed)
 PTNS  Session # 5  Health & Social Factors: some change in urgency Caffeine: 1 Alcohol: 0 Daytime voids #per day: 7 Night-time voids #per night: 4 Urgency: mild Incontinence Episodes #per day: 0 Ankle used: right Treatment Setting: sensory Feeling/ Response: 15 Comments: n'a  Performed By: Mathew Pinal, RN  Follow Up: 1 week

## 2024-09-05 NOTE — Patient Instructions (Signed)

## 2024-09-12 ENCOUNTER — Ambulatory Visit

## 2024-09-12 DIAGNOSIS — N3281 Overactive bladder: Secondary | ICD-10-CM | POA: Diagnosis not present

## 2024-09-12 NOTE — Progress Notes (Signed)
 PTNS   Session # 6   Health & Social Factors: some change in urgency Caffeine: 1 Alcohol: 0 Daytime voids #per day: 8 Night-time voids #per night: 3 Urgency: mild Incontinence Episodes #per day: 0 Ankle used: right Treatment Setting: sensory Feeling/ Response:15 Comments: n/a   Performed By: Harlene Franks CMA   Follow Up: 1 week

## 2024-09-12 NOTE — Patient Instructions (Signed)

## 2024-09-19 ENCOUNTER — Ambulatory Visit (INDEPENDENT_AMBULATORY_CARE_PROVIDER_SITE_OTHER): Admitting: Physician Assistant

## 2024-09-19 DIAGNOSIS — N3281 Overactive bladder: Secondary | ICD-10-CM

## 2024-09-19 NOTE — Progress Notes (Signed)
 PTNS  Session # 7  Health & Social Factors: No Change Caffeine: 1 Alcohol: 0 Daytime voids #per day: 5 Night-time voids #per night: 3 Urgency: None Incontinence Episodes #per day: 0 Ankle used: Right Treatment Setting: 1 Feeling/ Response: Sensory & Toe Flex Comments: N/A  Performed By: Santa Gang, CMA (AAMA)  Follow Up: RTC in 1 wk as scheduled

## 2024-09-19 NOTE — Patient Instructions (Signed)

## 2024-09-26 ENCOUNTER — Ambulatory Visit (INDEPENDENT_AMBULATORY_CARE_PROVIDER_SITE_OTHER): Admitting: Physician Assistant

## 2024-09-26 DIAGNOSIS — N3281 Overactive bladder: Secondary | ICD-10-CM | POA: Diagnosis not present

## 2024-09-26 NOTE — Patient Instructions (Signed)

## 2024-09-26 NOTE — Progress Notes (Signed)
 PTNS  Session # 8  Health & Social Factors: some change at night Caffeine: 2 Alcohol: 0 Daytime voids #per day: 6 Night-time voids #per night: 3 Urgency: mild Incontinence Episodes #per day: 0 Ankle used: right Treatment Setting: 12 Feeling/ Response: toe flex Comments: n/a  Performed By: Mathew Pinal, RN  Follow Up: 1 week

## 2024-10-03 ENCOUNTER — Ambulatory Visit (INDEPENDENT_AMBULATORY_CARE_PROVIDER_SITE_OTHER): Admitting: Physician Assistant

## 2024-10-03 DIAGNOSIS — N3281 Overactive bladder: Secondary | ICD-10-CM | POA: Diagnosis not present

## 2024-10-03 NOTE — Patient Instructions (Signed)

## 2024-10-03 NOTE — Progress Notes (Signed)
 PTNS  Session # 9  Health & Social Factors: some change Caffeine: 1 Alcohol: 0 Daytime voids #per day: 5 Night-time voids #per night: 3 Urgency: none Incontinence Episodes #per day: 0 Ankle used: right Treatment Setting: 8 Feeling/ Response: sensory Comments: n/a  Performed By: Mathew Pinal, RN  Follow Up: 1 month

## 2024-10-06 ENCOUNTER — Encounter: Payer: Self-pay | Admitting: Student in an Organized Health Care Education/Training Program

## 2024-10-06 ENCOUNTER — Ambulatory Visit: Admitting: Student in an Organized Health Care Education/Training Program

## 2024-10-06 ENCOUNTER — Ambulatory Visit
Attending: Student in an Organized Health Care Education/Training Program | Admitting: Student in an Organized Health Care Education/Training Program

## 2024-10-06 ENCOUNTER — Other Ambulatory Visit: Payer: Self-pay | Admitting: Family Medicine

## 2024-10-06 VITALS — BP 133/77 | HR 52 | Temp 97.9°F | Resp 16 | Ht 68.0 in | Wt 164.0 lb

## 2024-10-06 DIAGNOSIS — G588 Other specified mononeuropathies: Secondary | ICD-10-CM | POA: Diagnosis not present

## 2024-10-06 DIAGNOSIS — M47816 Spondylosis without myelopathy or radiculopathy, lumbar region: Secondary | ICD-10-CM | POA: Insufficient documentation

## 2024-10-06 DIAGNOSIS — G894 Chronic pain syndrome: Secondary | ICD-10-CM | POA: Diagnosis not present

## 2024-10-06 DIAGNOSIS — R1013 Epigastric pain: Secondary | ICD-10-CM

## 2024-10-06 NOTE — Patient Instructions (Signed)

## 2024-10-06 NOTE — Progress Notes (Signed)
 Safety precautions to be maintained throughout the outpatient stay will include: orient to surroundings, keep bed in low position, maintain call bell within reach at all times, provide assistance with transfer out of bed and ambulation.

## 2024-10-06 NOTE — Progress Notes (Signed)
 PROVIDER NOTE: Interpretation of information contained herein should be left to medically-trained personnel. Specific patient instructions are provided elsewhere under Patient Instructions section of medical record. This document was created in part using AI and STT-dictation technology, any transcriptional errors that may result from this process are unintentional.  Patient: Sarah [name]  Service: E/M   PCP: Zachary Idelia LABOR, MD  DOB: 1932-09-14  DOS: 10/06/2024  Provider: Wallie Sherry, MD  MRN: [id number]  Delivery: Face-to-face  Specialty: Interventional Pain Management  Type: Established Patient  Setting: Ambulatory outpatient facility  Specialty designation: 09  Referring Prov.: Zachary Idelia LABOR, MD  Location: Outpatient office facility       History of present illness (HPI) Sarah [name], a 89 y.o. 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 [name] primary complain today is Back Pain (Bilateral lumbar )  Pertinent problems: [name] has Lumbar facet arthropathy; Lumbar radiculopathy; Lumbar disc herniation (L2/L3); Lumbar degenerative disc disease; Chronic pain syndrome; Neuropathy; Thyroid  nodule; and Bilateral occipital neuralgia on their pertinent problem list.  Pain Assessment: Severity of Chronic pain is reported as a 8 /10. Location: Back Lower, Left, Right/into both hips. Onset: More than a month ago. Quality: Discomfort, Aching, Radiating, Constant, Shooting, Restless. Timing: Constant. Modifying factor(s): tylenol  helps dull the pain. Vitals:  height is 5' 8 (1.727 m) and weight is 164 lb (74.4 kg). Her temporal temperature is 97.9 F (36.6 C). Her blood pressure is 133/77 and her pulse is 52 (abnormal). Her respiration is 16 and oxygen saturation is 100%.  BMI: Estimated body mass index is 24.94 kg/m as calculated from the following:   Height as of this encounter: 5' 8 (1.727 m).   Weight as of this encounter: 164 lb  (74.4 kg).  Last encounter: 05/10/2024. Last procedure: 08/10/2024.  Reason for encounter:   Post-Procedure Evaluation   Procedure: Lumbar Facet, Medial Branch Radiofrequency Ablation (RFA) #2  Laterality: Bilateral (-50)  Level: L3, L4, and L5 Medial Branch Level(s). These levels will denervate the L3-4 and L4-5 lumbar facet joints.  Imaging: Fluoroscopy-guided         Anesthesia: Local anesthesia (1-2% Lidocaine ) DOS: 08/10/2024  Performed by: Wallie Sherry, MD  Purpose: Therapeutic/Palliative Indications: Low back pain severe enough to impact quality of life or function. Indications: 1. Lumbar facet arthropathy (L4/5, L5/S1)   2. Spondylosis of lumbar joint    Sarah [name] has been dealing with the above chronic pain for longer than three months and has either failed to respond, was unable to tolerate, or simply did not get enough benefit from other more conservative therapies including, but not limited to: 1. Over-the-counter medications 2. Anti-inflammatory medications 3. Muscle relaxants 4. Membrane stabilizers 5. Opioids 6. Physical therapy and/or chiropractic manipulation 7. Modalities (Heat, ice, etc.) 8. Invasive techniques such as nerve blocks. Sarah [name] has attained more than 50% relief of the pain from a series of diagnostic injections conducted in separate occasions.  Pain Score: Pre-procedure: 4 /10 Post-procedure: 2/10    Effectiveness:  Initial hour after procedure: 100 %  Subsequent 4-6 hours post-procedure: 100 %  Analgesia past initial 6 hours: 0 % (once numbness wore off that evening pain returned all at once.)  Ongoing improvement:  Analgesic:  0%   ROS  Constitutional: Denies any fever or chills Gastrointestinal: No reported hemesis, hematochezia, vomiting, or acute GI distress Musculoskeletal: Bilateral hip pain Neurological: No reported episodes of acute onset apraxia, aphasia, dysarthria, agnosia, amnesia, paralysis, loss of  coordination, or  loss of consciousness  Medication Review  Apoaequorin, Cholecalciferol, DULoxetine , Multiple Vitamins-Minerals, Probiotic (Lactobacillus), Trospium  Chloride, acetaminophen , alendronate, amoxicillin , baclofen, beta carotene w/minerals, bimatoprost, cyanocobalamin, donepezil, fexofenadine, latanoprost, levothyroxine , lovastatin, melatonin, methylPREDNISolone , polyethylene glycol powder, pregabalin , and triamcinolone  History Review  Allergy: Sarah [name] is allergic to cholecalciferol, macrobid  [nitrofurantoin ], butrans  [buprenorphine ], codeine, hydrocodone, oxycodone , peanut oil, peanut-containing drug products, penicillins, and tramadol. Drug: Sarah [name]  reports no history of drug use. Alcohol:  reports no history of alcohol use. Tobacco:  reports that she has never smoked. She has never used smokeless tobacco. Social: [name]  reports that she has never smoked. She has never used smokeless tobacco. She reports that she does not drink alcohol and does not use drugs. Medical:  has a past medical history of Allergic rhinitis, Benign essential HTN (09/21/2015), Bilateral occipital neuralgia, Cancer (HCC), Closed nondisplaced fracture of acromial end of right clavicle, Displacement of lumbar intervertebral disc without myelopathy, Dysphagia, Headache disorder, Localized primary osteoarthritis, Memory difficulty, Mixed hyperlipidemia, Moderate mitral insufficiency, Moderate tricuspid insufficiency, Neuropathy, Numbness in feet, Pre-diabetes, Seizures (HCC), Sensory ataxia, SI joint arthritis, Sleep apnea, SOBOE (shortness of breath on exertion), Spinal stenosis of lumbar region, 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 Surgical: Sarah [name]  has a past surgical history that includes Abdominal hysterectomy; Breast biopsy (Bilateral); Breast excisional biopsy (Right); Knee arthroscopy w/ lateral retinacular repair;  Esophagogastroduodenoscopy (N/A, 03/11/2024); and Esophageal dilation (03/11/2024). Family: family history includes Brain cancer in her sister; Breast cancer in her maternal aunt; Breast cancer (age of onset: 14) in her maternal aunt; Breast cancer (age of onset: 71) in her mother; Breast cancer (age of onset: 73) in her maternal uncle; Cancer in her brother and sister; Tracheal cancer in her father.  Laboratory Chemistry Profile   Renal Lab Results  Component Value Date   BUN 15 10/24/2023   CREATININE 0.73 10/24/2023   GFRAA 77 09/12/2020   GFRNONAA >60 10/24/2023    Hepatic Lab Results  Component Value Date   AST 19 11/26/2015   ALT 15 11/26/2015   ALBUMIN 4.7 11/26/2015   ALKPHOS 64 11/26/2015    Electrolytes Lab Results  Component Value Date   NA 140 10/24/2023   K 3.8 10/24/2023   CL 105 10/24/2023   CALCIUM 9.1 10/24/2023   MG 2.3 01/20/2023    Bone No results found for: VD25OH, VD125OH2TOT, CI6874NY7, CI7874NY7, 25OHVITD1, 25OHVITD2, 25OHVITD3, TESTOFREE, TESTOSTERONE  Inflammation (CRP: Acute Phase) (ESR: Chronic Phase) No results found for: CRP, ESRSEDRATE, LATICACIDVEN       Note: Above Lab results reviewed.  Recent Imaging Review  DG PAIN CLINIC C-ARM 1-60 MIN NO REPORT Fluoro was used, but no Radiologist interpretation will be provided.  Please refer to NOTES tab for provider progress note. Note: Reviewed        Physical Exam  Vitals: BP 133/77 (BP Location: Right Arm, Patient Position: Sitting, Cuff Size: Normal)   Pulse (!) 52   Temp 97.9 F (36.6 C) (Temporal)   Resp 16   Ht 5' 8 (1.727 m)   Wt 164 lb (74.4 kg)   SpO2 100%   BMI 24.94 kg/m  BMI: Estimated body mass index is 24.94 kg/m as calculated from the following:   Height as of this encounter: 5' 8 (1.727 m).   Weight as of this encounter: 164 lb (74.4 kg). Ideal: Ideal body weight: 63.9 kg (140 lb 14 oz) Adjusted ideal  body weight: 68.1 kg (150 lb 2 oz) General  appearance: Well nourished, well developed, and well hydrated. In no apparent acute distress Mental status: Alert, oriented x 3 (person, place, & time)       Respiratory: No evidence of acute respiratory distress Eyes: PERLA  Bilateral buttock and lower back pain overlying iliac crest, tender to palpation  Assessment   Diagnosis  1. Cluneal neuropathy   2. Lumbar facet arthropathy (L4/5, L5/S1)   3. Spondylosis of lumbar joint   4. Chronic pain syndrome      Updated Problems: No problems updated.  Plan of Care   1. Cluneal neuropathy (Primary) - For home use only DME Other see comment - CLUNEAL NERVE BLOCK; Future  2. Lumbar facet arthropathy (L4/5, L5/S1) - For home use only DME Other see comment  3. Spondylosis of lumbar joint - For home use only DME Other see comment  4. Chronic pain syndrome - For home use only DME Other see comment - CLUNEAL NERVE BLOCK; Future  The patient presents for follow-up after bilateral L3, L4, and L5 medial branch radiofrequency ablation with unfortunately no significant improvement in her pain. She continues to report focal pain overlying the iliac crest region, which is most consistent with cluneal nerve-mediated pain. She has previously responded favorably to cluneal nerve blocks, supporting this diagnosis. Given her persistent symptoms and prior positive response, we discussed repeating cluneal nerve blocks for both diagnostic confirmation and therapeutic benefit. Should she again experience meaningful but temporary relief, she will be considered for cluneal nerve peripheral nerve stimulation as a next step for longer-term pain control. In addition, she has impaired mobility and balance due to her chronic pain and is unable to safely ambulate or perform mobility-related activities of daily living without assistance. A rollator walker with a seat is medically necessary to provide stability during ambulation and allow seated rest breaks due to  limited endurance, as a cane and standard walker are insufficient to meet her functional needs. A DME prescription for a rollator with seat was provided today.  Orders:  Orders Placed This Encounter  Procedures   CLUNEAL NERVE BLOCK    Standing Status:   Future    Expiration Date:   01/04/2025    Scheduling Instructions:     Side: Bilateral     Sedation: without    Where will this procedure be performed?:   [facility]   For home use only DME Other see comment    Patient Name: ARIONNA HOGGARD DOB: [birth year]  Diagnosis: chronic lumbar radicular pain, spinal stenosis with neurogenic claudication   Item Requested: 3-wheel rollator walker with seat  [facility]S: [phone]  Length of Need: 99 months (or lifetime)   Patient has impaired mobility and balance due to the above diagnosis and is unable to safely ambulate or perform mobility-related activities of daily living without assistance. A rollator walker with a seat is required to provide stability during ambulation and allow seated rest breaks due to limited endurance. Cane and standard walker are insufficient to meet the patient's functional needs.    Length of Need:   Lifetime    Return in about 6 days (around 10/12/2024) for B/L Cluneal NB, in clinic NS.    Recent Visits Date Type Provider Dept  08/10/24 Procedure visit Marcelino Nurse, MD [facility]  07/11/24 Procedure visit Marcelino Nurse, MD [facility]  Showing recent visits within past 90 days and meeting all other requirements Today's Visits Date Type Provider  Dept  10/06/24 Office Visit Marcelino Nurse, MD [facility]  Showing today's visits and meeting all other requirements Future Appointments Date Type Provider Dept  10/12/24 Appointment Marcelino Nurse, MD [facility]  Showing future appointments within next 90 days and meeting all other requirements  I discussed the assessment and treatment plan with the patient. The  patient was provided an opportunity to ask questions and all were answered. The patient agreed with the plan and demonstrated an understanding of the instructions.  Patient advised to call back or seek an in-person evaluation if the symptoms or condition worsens.  I personally spent a total of 30 minutes in the care of the patient today including preparing to see the patient, getting/reviewing separately obtained history, performing a medically appropriate exam/evaluation, counseling and educating, placing orders, and documenting clinical information in the EHR.   Note by: Nurse Marcelino, MD (TTS and AI technology used. I apologize for any typographical errors that were not detected and corrected.) Date: 10/06/2024; Time: 3:22 PM

## 2024-10-10 ENCOUNTER — Ambulatory Visit (INDEPENDENT_AMBULATORY_CARE_PROVIDER_SITE_OTHER): Admitting: Physician Assistant

## 2024-10-10 DIAGNOSIS — N3281 Overactive bladder: Secondary | ICD-10-CM | POA: Diagnosis not present

## 2024-10-10 NOTE — Progress Notes (Signed)
 PTNS  Session # 10  Health & Social Factors: not as rushed when need to go to the bathroom Caffeine: 1 Alcohol: 0 Daytime voids #per day: 5 Night-time voids #per night: 3 Urgency: mild Incontinence Episodes #per day: 0 Ankle used: right Treatment Setting: 18 Feeling/ Response: sensory Comments: n/a  Performed By: Mathew Pinal, RN  Follow Up: 1 week

## 2024-10-10 NOTE — Patient Instructions (Signed)

## 2024-10-11 ENCOUNTER — Ambulatory Visit
Admission: RE | Admit: 2024-10-11 | Discharge: 2024-10-11 | Disposition: A | Discharge status: Home / Self Care | Source: Ambulatory Visit | Attending: Family Medicine | Admitting: Family Medicine

## 2024-10-11 DIAGNOSIS — R1013 Epigastric pain: Secondary | ICD-10-CM | POA: Insufficient documentation

## 2024-10-12 ENCOUNTER — Encounter: Payer: Self-pay | Admitting: Student in an Organized Health Care Education/Training Program

## 2024-10-12 ENCOUNTER — Ambulatory Visit
Admission: RE | Admit: 2024-10-12 | Discharge: 2024-10-12 | Disposition: A | Discharge status: Home / Self Care | Source: Ambulatory Visit | Attending: Student in an Organized Health Care Education/Training Program | Admitting: Student in an Organized Health Care Education/Training Program

## 2024-10-12 ENCOUNTER — Ambulatory Visit: Admitting: Student in an Organized Health Care Education/Training Program

## 2024-10-12 VITALS — BP 136/77 | HR 93 | Temp 97.7°F | Resp 18 | Ht 68.0 in | Wt 165.0 lb

## 2024-10-12 DIAGNOSIS — G894 Chronic pain syndrome: Secondary | ICD-10-CM | POA: Insufficient documentation

## 2024-10-12 DIAGNOSIS — G588 Other specified mononeuropathies: Secondary | ICD-10-CM | POA: Diagnosis present

## 2024-10-12 DIAGNOSIS — M47816 Spondylosis without myelopathy or radiculopathy, lumbar region: Secondary | ICD-10-CM | POA: Insufficient documentation

## 2024-10-12 MED ORDER — DEXAMETHASONE SOD PHOSPHATE PF 10 MG/ML IJ SOLN
INTRAMUSCULAR | Status: AC
  Filled 2024-10-12: qty 2

## 2024-10-12 MED ORDER — ROPIVACAINE HCL 2 MG/ML IJ SOLN
INTRAMUSCULAR | Status: AC
  Filled 2024-10-12: qty 20

## 2024-10-12 MED ORDER — LIDOCAINE HCL 2 % IJ SOLN
20.0000 mL | Freq: Once | INTRAMUSCULAR | Status: AC
  Administered 2024-10-12: 400 mg

## 2024-10-12 MED ORDER — DEXAMETHASONE SOD PHOSPHATE PF 10 MG/ML IJ SOLN
20.0000 mg | Freq: Once | INTRAMUSCULAR | Status: AC
  Administered 2024-10-12: 20 mg

## 2024-10-12 MED ORDER — ROPIVACAINE HCL 2 MG/ML IJ SOLN
18.0000 mL | Freq: Once | INTRAMUSCULAR | Status: AC
  Administered 2024-10-12: 18 mL via PERINEURAL

## 2024-10-12 MED ORDER — LIDOCAINE HCL 2 % IJ SOLN
INTRAMUSCULAR | Status: AC
  Filled 2024-10-12: qty 20

## 2024-10-12 NOTE — Progress Notes (Signed)
 Safety precautions to be maintained throughout the outpatient stay will include: orient to surroundings, keep bed in low position, maintain call bell within reach at all times, provide assistance with transfer out of bed and ambulation.

## 2024-10-12 NOTE — Patient Instructions (Signed)

## 2024-10-12 NOTE — Progress Notes (Signed)
 PROVIDER NOTE: Interpretation of information contained herein should be left to medically-trained personnel. Specific patient instructions are provided elsewhere under Patient Instructions section of medical record. This document was created in part using STT-dictation technology, any transcriptional errors that may result from this process are unintentional.  Patient: Sarah [name] Type: Established DOB: March 22, 1932 MRN: [id number] PCP: Zachary Idelia LABOR, MD  Service: Procedure DOS: 10/12/2024 Setting: Ambulatory Location: Ambulatory outpatient facility Delivery: Face-to-face Provider: Wallie Sherry, MD Specialty: Interventional Pain Management Specialty designation: 09 Location: Outpatient facility Ref. Prov.: Zachary Idelia LABOR, MD       Interventional Therapy   Procedure: Bilateral cluneal nerve block Laterality: Bilateral     Region: Lumbosacral-sacrococcygeal. Type of procedure: Percutaneous injection.  Imaging: Fluoroscopy-guided Non-spinal ([id number]) Anesthesia: Local anesthesia (1-2% Lidocaine ) DOS: 10/12/2024  Performed by: Wallie Sherry, MD  Purpose: Diagnostic/Therapeutic  Rationale (medical necessity): procedure needed and proper for the diagnosis and/or treatment of Ms. Pignato's medical symptoms and needs. 1. Cluneal neuropathy   2. Lumbar facet arthropathy (L4/5, L5/S1)   3. Chronic pain syndrome    NAS-11 Pain score:   Pre-procedure: 5 /10   Post-procedure: 5 /10      Position / Prep / Materials:  Position: Prone  Prep solution: ChloraPrep (2% chlorhexidine gluconate and 70% isopropyl alcohol) Prep Area: Entire posterior lumbosacral area  Materials:  Tray: Block Needle(s):  Type: Spinal  Gauge (G): 22  Length: 3.5-in Qty: One (1) per procedure side.  H&P (Pre-op Assessment):  Sarah [name] is a 89 y.o. (year old), female patient, seen today for interventional treatment. She  has a past surgical history that includes Abdominal hysterectomy; Breast  biopsy (Bilateral); Breast excisional biopsy (Right); Knee arthroscopy w/ lateral retinacular repair; Esophagogastroduodenoscopy (N/A, 03/11/2024); and Esophageal dilation (03/11/2024). Sarah [name] has a current medication list which includes the following prescription(s): acetaminophen , alendronate, amoxicillin , apoaequorin, baclofen, beta carotene w/minerals, d 1000, cyanocobalamin, donepezil, duloxetine , fexofenadine, latanoprost, levothyroxine , levothyroxine , lovastatin, lumigan, melatonin, methylprednisolone , multiple vitamins-minerals, polyethylene glycol powder, pregabalin , probiotic (lactobacillus), triamcinolone, and trospium  chloride. Her primarily concern today is the Back Pain (Lower back/upper buttocks )  Initial Vital Signs:  Pulse/HCG Rate: 93ECG Heart Rate: 86 Temp: 97.7 F (36.5 C) Resp: 16 BP: 120/66 SpO2: 97 %  BMI: Estimated body mass index is 25.09 kg/m as calculated from the following:   Height as of this encounter: 5' 8 (1.727 m).   Weight as of this encounter: 165 lb (74.8 kg).  Risk Assessment: Allergies: Reviewed. She is allergic to cholecalciferol, macrobid  [nitrofurantoin ], butrans  [buprenorphine ], codeine, hydrocodone, oxycodone , peanut oil, peanut-containing drug products, penicillins, and tramadol.  Allergy Precautions: None required Coagulopathies: Reviewed. None identified.  Blood-thinner therapy: None at this time Active Infection(s): Reviewed. None identified. Sarah [name] is afebrile  Site Confirmation: Sarah [name] was asked to confirm the procedure and laterality before marking the site Procedure checklist: Completed Consent: Before the procedure and under the influence of no sedative(s), amnesic(s), or anxiolytics, the patient was informed of the treatment options, risks and possible complications. To fulfill our ethical and legal obligations, as recommended by the American Medical Association's Code of Ethics, I have informed the patient of my clinical  impression; the nature and purpose of the treatment or procedure; the risks, benefits, and possible complications of the intervention; the alternatives, including doing nothing; the risk(s) and benefit(s) of the alternative treatment(s) or procedure(s); and the risk(s) and benefit(s) of doing nothing. The patient was provided information about the general risks and possible complications associated with the procedure. These may include,  but are not limited to: failure to achieve desired goals, infection, bleeding, organ or nerve damage, allergic reactions, paralysis, and death. In addition, the patient was informed of those risks and complications associated to the procedure, such as failure to decrease pain; infection; bleeding; organ or nerve damage with subsequent damage to sensory, motor, and/or autonomic systems, resulting in permanent pain, numbness, and/or weakness of one or several areas of the body; allergic reactions; (i.e.: anaphylactic reaction); and/or death. Furthermore, the patient was informed of those risks and complications associated with the medications. These include, but are not limited to: allergic reactions (i.e.: anaphylactic or anaphylactoid reaction(s)); adrenal axis suppression; blood sugar elevation that in diabetics may result in ketoacidosis or comma; water retention that in patients with history of congestive heart failure may result in shortness of breath, pulmonary edema, and decompensation with resultant heart failure; weight gain; swelling or edema; medication-induced neural toxicity; particulate matter embolism and blood vessel occlusion with resultant organ, and/or nervous system infarction; and/or aseptic necrosis of one or more joints. Finally, the patient was informed that Medicine is not an exact science; therefore, there is also the possibility of unforeseen or unpredictable risks and/or possible complications that may result in a catastrophic outcome. The patient  indicated having understood very clearly. We have given the patient no guarantees and we have made no promises. Enough time was given to the patient to ask questions, all of which were answered to the patient's satisfaction. Ms. Resor has indicated that she wanted to continue with the procedure. Attestation: I, the ordering provider, attest that I have discussed with the patient the benefits, risks, side-effects, alternatives, likelihood of achieving goals, and potential problems during recovery for the procedure that I have provided informed consent. Date  Time: 10/12/2024 12:37 PM  Pre-Procedure Preparation:  Monitoring: As per clinic protocol. Respiration, ETCO2, SpO2, BP, heart rate and rhythm monitor placed and checked for adequate function Safety Precautions: Patient was assessed for positional comfort and pressure points before starting the procedure. Time-out: I initiated and conducted the Time-out before starting the procedure, as per protocol. The patient was asked to participate by confirming the accuracy of the Time Out information. Verification of the correct person, site, and procedure were performed and confirmed by me, the nursing staff, and the patient. Time-out conducted as per Joint Commission's Universal Protocol (UP.01.01.01). Time: 1332 Start Time: 1332 hrs.  Description/Narrative of Procedure:          Start Time: 1332 hrs.  Rationale (medical necessity): procedure needed and proper for the diagnosis and/or treatment of the patient's medical symptoms and needs. Procedural Technique Safety Precautions: Aspiration looking for blood return was conducted prior to all injections. At no point did we inject any substances, as a needle was being advanced. No attempts were made at seeking any paresthesias. Safe injection practices and needle disposal techniques used. Medications properly checked for expiration dates. SDV (single dose vial) medications used.   The skin over the  right and left posterior iliac crest region was prepped with chlorhexidine and draped in a sterile fashion. Using anatomic landmarks and fluoroscopic guidance, a 25-gauge spinal needle was advanced to the region approximately 7-8 cm lateral to midline and 1-2 cm superior to the right and left  posterior superior iliac spine (PSIS), consistent with the expected course of the right and left superior cluneal nerves. After negative aspiration, 10 mL of injectate composed of: 8 mL of 0.2% Ropivacaine , and 2 mL of Decadron  (10 mg/mL) of which 5 cc was  injected for the right and left cluneal nerves   The needle was withdrawn and hemostasis achieved. The patient tolerated the procedure well without immediate complications.   Vitals:   10/12/24 1304 10/12/24 1330 10/12/24 1336 10/12/24 1338  BP: 120/66 (!) 136/97 (!) 137/106 136/77  Pulse: 93     Resp: 16 18 18 18   Temp: 97.7 F (36.5 C)     TempSrc: Temporal     SpO2: 97% 99% 95% 98%  Weight: 165 lb (74.8 kg)     Height: 5' 8 (1.727 m)        End Time: 1337 hrs.  Imaging Guidance (Non-Spinal):          Type of Imaging Technique: Fluoroscopy Guidance (Non-Spinal) Indication(s): Fluoroscopy guidance for needle placement to enhance accuracy in procedures requiring precise needle localization for targeted delivery of medication in or near specific anatomical locations not easily accessible without such real-time imaging assistance. Exposure Time: Please see nurses notes. Contrast: None used. Fluoroscopic Guidance: I was personally present during the use of fluoroscopy. Tunnel Vision Technique used to obtain the best possible view of the target area. Parallax error corrected before commencing the procedure. Direction-depth-direction technique used to introduce the needle under continuous pulsed fluoroscopy. Once target was reached, antero-posterior, oblique, and lateral fluoroscopic projection used confirm needle placement in all planes. Images  permanently stored in EMR. Interpretation: No contrast injected. I personally interpreted the imaging intraoperatively. Adequate needle placement confirmed in multiple planes. Permanent images saved into the patient's record.  Post-operative Assessment:  Post-procedure Vital Signs:  Pulse/HCG Rate: 9384 Temp: 97.7 F (36.5 C) Resp: 18 BP: 136/77 SpO2: 98 %  EBL: None  Complications: No immediate post-treatment complications observed by team, or reported by patient.  Note: The patient tolerated the entire procedure well. A repeat set of vitals were taken after the procedure and the patient was kept under observation following institutional policy, for this type of procedure. Post-procedural neurological assessment was performed, showing return to baseline, prior to discharge. The patient was provided with post-procedure discharge instructions, including a section on how to identify potential problems. Should any problems arise concerning this procedure, the patient was given instructions to immediately contact us , at any time, without hesitation. In any case, we plan to contact the patient by telephone for a follow-up status report regarding this interventional procedure.  Comments:  No additional relevant information.  Plan of Care (POC)  Orders:  Orders Placed This Encounter  Procedures   DG PAIN CLIN[facility] C-ARM 1-60 MIN NO REPORT    Intraoperative interpretation by procedural physician at [facility].    Standing Status:   Standing    Number of Occurrences:   1    Reason for exam::   Assistance in needle guidance and placement for procedures requiring needle placement in or near specific anatomical locations not easily accessible without such assistance.     Medications ordered for procedure: Meds ordered this encounter  Medications   lidocaine  (XYLOCAINE ) 2 % (with pres) injection 400 mg   ropivacaine  (PF) 2 mg/mL (0.2%) (NAROPIN ) injection 18 mL   dexamethasone   (DECADRON ) injection 20 mg   Medications administered: We administered lidocaine , ropivacaine  (PF) 2 mg/mL (0.2%), and dexamethasone .  See the medical record for exact dosing, route, and time of administration.   Follow-up plan:   Return in about 2 weeks (around 10/26/2024) for F2F PPE , MBNB vs CNB, PNS.     Recent Visits Date Type Provider Dept  10/06/24 Office Visit Braddock,  Wallie, MD [facility]  08/10/24 Procedure visit Marcelino Wallie, MD [facility]  Showing recent visits within past 90 days and meeting all other requirements Today's Visits Date Type Provider Dept  10/12/24 Procedure visit Marcelino Wallie, MD [facility]  Showing today's visits and meeting all other requirements Future Appointments Date Type Provider Dept  10/27/24 Appointment Marcelino Wallie, MD [facility]  Showing future appointments within next 90 days and meeting all other requirements   Disposition: Discharge home  Discharge (Date  Time): 10/12/2024; 1345 hrs.   Primary Care Physician: Zachary Idelia LABOR, MD Location: [facility] Note by: Wallie Marcelino, MD (TTS technology used. I apologize for any typographical errors that were not detected and corrected.) Date: 10/12/2024; Time: 4:23 PM  Disclaimer:  Medicine is not an visual merchandiser. The only guarantee in medicine is that nothing is guaranteed. It is important to note that the decision to proceed with this intervention was based on the information collected from the patient. The Data and conclusions were drawn from the patient's questionnaire, the interview, and the physical examination. Because the information was provided in large part by the patient, it cannot be guaranteed that it has not been purposely or unconsciously manipulated. Every effort has been made to obtain as much relevant data as possible for this evaluation. It is important to note that the conclusions that lead to this  procedure are derived in large part from the available data. Always take into account that the treatment will also be dependent on availability of resources and existing treatment guidelines, considered by other Pain Management Practitioners as being common knowledge and practice, at the time of the intervention. For Medico-Legal purposes, it is also important to point out that variation in procedural techniques and pharmacological choices are the acceptable norm. The indications, contraindications, technique, and results of the above procedure should only be interpreted and judged by a Board-Certified Interventional Pain Specialist with extensive familiarity and expertise in the same exact procedure and technique.

## 2024-10-13 ENCOUNTER — Telehealth: Payer: Self-pay | Admitting: *Deleted

## 2024-10-13 ENCOUNTER — Other Ambulatory Visit: Payer: Self-pay | Admitting: Gastroenterology

## 2024-10-13 DIAGNOSIS — R1013 Epigastric pain: Secondary | ICD-10-CM

## 2024-10-13 DIAGNOSIS — K838 Other specified diseases of biliary tract: Secondary | ICD-10-CM

## 2024-10-13 NOTE — Telephone Encounter (Signed)
 Post procedure call:   no  questions or concerns.

## 2024-10-17 ENCOUNTER — Ambulatory Visit

## 2024-10-18 ENCOUNTER — Ambulatory Visit

## 2024-10-19 ENCOUNTER — Ambulatory Visit
Admission: RE | Admit: 2024-10-19 | Discharge: 2024-10-19 | Disposition: A | Discharge status: Home / Self Care | Source: Ambulatory Visit | Attending: Gastroenterology | Admitting: Gastroenterology

## 2024-10-19 DIAGNOSIS — K838 Other specified diseases of biliary tract: Secondary | ICD-10-CM | POA: Insufficient documentation

## 2024-10-19 DIAGNOSIS — R1013 Epigastric pain: Secondary | ICD-10-CM | POA: Diagnosis present

## 2024-10-19 MED ORDER — GADOBUTROL 1 MMOL/ML IV SOLN
7.0000 mL | Freq: Once | INTRAVENOUS | Status: AC | PRN
  Administered 2024-10-19: 7 mL via INTRAVENOUS

## 2024-10-24 ENCOUNTER — Ambulatory Visit

## 2024-10-25 ENCOUNTER — Ambulatory Visit

## 2024-10-25 DIAGNOSIS — N3281 Overactive bladder: Secondary | ICD-10-CM

## 2024-10-25 NOTE — Progress Notes (Signed)
 PTNS  Session # 11  Health & Social Factors: some increase due to more coffee Caffeine: 2 Alcohol: 0 Daytime voids #per day: 8-10 Night-time voids #per night: 3-4 Urgency: none Incontinence Episodes #per day: 0 Ankle used: right Treatment Setting: 15 Feeling/ Response: sensory Comments: n/a  Performed By: Mathew Pinal, RN  Follow Up: 1 week

## 2024-10-25 NOTE — Patient Instructions (Signed)

## 2024-10-27 ENCOUNTER — Ambulatory Visit
Admission: EM | Admit: 2024-10-27 | Discharge: 2024-10-27 | Disposition: A | Discharge status: Home / Self Care | Source: Home / Self Care

## 2024-10-27 ENCOUNTER — Encounter: Payer: Self-pay | Admitting: Student in an Organized Health Care Education/Training Program

## 2024-10-27 ENCOUNTER — Ambulatory Visit: Admitting: Student in an Organized Health Care Education/Training Program

## 2024-10-27 VITALS — BP 126/81 | HR 88 | Temp 97.2°F | Resp 16 | Ht 68.0 in | Wt 165.0 lb

## 2024-10-27 DIAGNOSIS — W19XXXA Unspecified fall, initial encounter: Secondary | ICD-10-CM

## 2024-10-27 DIAGNOSIS — G894 Chronic pain syndrome: Secondary | ICD-10-CM

## 2024-10-27 DIAGNOSIS — S0990XA Unspecified injury of head, initial encounter: Secondary | ICD-10-CM | POA: Diagnosis not present

## 2024-10-27 DIAGNOSIS — S0993XA Unspecified injury of face, initial encounter: Secondary | ICD-10-CM | POA: Diagnosis not present

## 2024-10-27 DIAGNOSIS — G588 Other specified mononeuropathies: Secondary | ICD-10-CM

## 2024-10-27 NOTE — ED Provider Notes (Signed)
 " [facility]    CSN: [id number] Arrival date & time: 10/27/24  1322      History   Chief Complaint Chief Complaint  Patient presents with   Fall    HPI Sarah [name] is a 89 y.o. female presenting with a relative for traumatic head/facial injuries that occurred in the past 50 minutes.  Patient says she is not exactly sure how she fell.  She is unsure if she lost her balance or slipped.  She denies any preceding headaches, dizziness, chest pain, palpitations, breathing issue.  She says she fell as she stepped outside her front door and is not even sure what her face hit.  She has a history of similar fall last year where she injured her face.  She fractured her right orbit and nose at that time.  She also suffered a fracture of her shoulder and her right shoulder is hurting again.  Patient is reporting a little bit of dizziness and headache at this time.  She did not lose consciousness.  She has not had any vomiting.  Multiple contusions and abrasions of face.  Reports bleeding from the right nostril.  Patient is not currently on any anticoagulants.  HPI  Past Medical History:  Diagnosis Date   Allergic rhinitis    Benign essential HTN 09/21/2015   Bilateral occipital neuralgia    Cancer (HCC)    Skin CA resected from Right eyelid and both legs.   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    Stage 3a chronic kidney disease (CKD)  (HCC)    Thyroid  disease     Patient Active Problem List   Diagnosis Date Noted   DNR (do not resuscitate)/DNI(Do Not Intubate) 10/24/2023   Fracture, shoulder, right, closed, initial encounter 10/23/2023   Fall at home, initial encounter 10/23/2023   HLD (hyperlipidemia) 10/23/2023   Chronic diastolic CHF (congestive heart failure) (HCC) 10/23/2023   Piriformis syndrome of both sides 08/17/2023   Chronic pain of right knee 07/29/2023   Bilateral primary osteoarthritis of hip 01/22/2022   Bilateral occipital neuralgia 09/24/2020   Cervical facet joint syndrome 03/05/2020   Cervical spondylosis 03/01/2020   SI joint arthritis 06/23/2019   Chronic SI joint pain 06/23/2019   Carpal tunnel syndrome 06/07/2019   Low back pain 06/07/2019   Major depressive disorder with single episode, in full remission 06/07/2019   Osteoarthritis of knee 06/07/2019   Pain in limb 06/07/2019   SOBOE (shortness of breath on exertion) 10/14/2018   Lumbar radiculopathy 07/22/2018   Lumbar disc herniation (L2/L3) 07/22/2018   Lumbar degenerative disc disease 07/22/2018   Chronic pain syndrome 07/22/2018   Cluneal neuropathy 07/22/2018   Localized, primary osteoarthritis 01/14/2018   Trochanteric bursitis of left hip 01/14/2018   Left leg pain 04/24/2016   Mixed hyperlipidemia 02/05/2016  History of fall 01/24/2016   Sensory ataxia 01/24/2016   Benign essential HTN 09/21/2015   Moderate tricuspid insufficiency 09/21/2015   Numbness in feet 04/12/2015   Mild vitamin D deficiency 10/23/2014   Allergic rhinitis 05/15/2014   Bilateral hip pain 09/06/2013   Dysphagia 04/15/2013   Squamous cell carcinoma 04/15/2013   Thyroid  nodule 04/15/2013   Lumbar facet arthropathy 12/07/2012   Stenosis of lumbosacral spine 12/07/2012   IBS (irritable bowel syndrome) 04/12/2012   Acquired hypothyroidism 06/24/2011   Osteopenia 05/14/2011    Past Surgical History:  Procedure Laterality Date   ABDOMINAL  HYSTERECTOMY     BREAST BIOPSY Bilateral    neg   BREAST EXCISIONAL BIOPSY Right    ESOPHAGEAL DILATION  03/11/2024   Procedure: DILATION, ESOPHAGUS;  Surgeon: Maryruth Ole DASEN, MD;  Location: [facility] ENDOSCOPY;  Service: Endoscopy;;   ESOPHAGOGASTRODUODENOSCOPY N/A 03/11/2024   Procedure: EGD (ESOPHAGOGASTRODUODENOSCOPY);  Surgeon: Maryruth Ole DASEN, MD;  Location: [facility] ENDOSCOPY;  Service: Endoscopy;  Laterality: N/A;   KNEE ARTHROSCOPY W/ LATERAL RETINACULAR REPAIR      OB History   No obstetric history on file.      Home Medications    Prior to Admission medications  Medication Sig Start Date End Date Taking? Authorizing Provider  acetaminophen  (TYLENOL ) 500 MG tablet Take 1,000 mg by mouth every 6 (six) hours as needed.    [provider]  alendronate (FOSAMAX) 70 MG tablet Take 70 mg by mouth every 7 (seven) days. 01/26/21   [provider]  amoxicillin  (AMOXIL ) 500 MG capsule SMARTSIG:4 Capsule(s) By Mouth Patient not taking: Reported on 10/27/2024 02/16/24   [provider]  Apoaequorin (PREVAGEN PO) Take 75 mg by mouth in the morning, at noon, and at bedtime.    [provider]  baclofen (LIORESAL) 10 MG tablet TAKE 1 TABLET (10 MG TOTAL) BY MOUTH TWICE A DAY 05/24/24   [provider]  beta carotene w/minerals (OCUVITE) tablet Take 1 tablet by mouth daily.    [provider]  Cholecalciferol (D 1000) 25 MCG (1000 UT) capsule Take 1,000 Units by mouth. Patient not taking: Reported on 10/27/2024    [provider]  cyanocobalamin (VITAMIN B12) 1000 MCG tablet Take 1,000 mcg by mouth.    [provider]  dicyclomine (BENTYL) 10 MG capsule Take 10 mg by mouth 2 (two) times daily.    [provider]  donepezil (ARICEPT ODT) 10 MG disintegrating tablet Take 10 mg by mouth. 02/29/24   [provider]  DULoxetine  (CYMBALTA ) 60 MG capsule Take 60 mg by mouth daily. 12/26/23   [provider]   fexofenadine (ALLEGRA) 180 MG tablet Take 180 mg by mouth daily.    [provider]  latanoprost (XALATAN) 0.005 % ophthalmic solution 1 drop daily. 07/13/24   [provider]  levothyroxine  (SYNTHROID ) 100 MCG tablet Take 100 mcg by mouth daily. Patient not taking: Reported on 10/27/2024 09/05/20   [provider]  levothyroxine  (SYNTHROID ) 125 MCG tablet Take 125 mcg by mouth daily.    [provider]  lovastatin (MEVACOR) 20 MG tablet Take 20 mg by mouth. 04/03/24 04/03/25  [provider]  LUMIGAN 0.01 % SOLN Place 1 drop into both eyes at bedtime. 12/25/20   [provider]  Melatonin 1 MG TABS Take 10 mg by mouth once.    [provider]  meloxicam  (MOBIC ) 7.5 MG tablet Take 7.5 mg by mouth daily as needed. for pain    [provider]  methylPREDNISolone  (MEDROL  DOSEPAK) 4 MG TBPK tablet Take by mouth. Patient not taking: Reported on 10/27/2024 06/13/24   [provider]  Multiple Vitamins-Minerals (PRESERVISION AREDS 2 PO) Take by mouth.    [provider]  polyethylene glycol powder (GLYCOLAX/MIRALAX) 17 GM/SCOOP powder Take by mouth. 03/29/20   [provider]  pregabalin  (LYRICA ) 75 MG capsule Take 75 mg by mouth 3 (three) times daily.    [provider]  Probiotic, Lactobacillus, CAPS Take by mouth.    [provider]  triamcinolone (KENALOG) 0.025 % cream Apply topically 2 (two) times daily. 05/12/24   [provider]  Trospium  Chloride 60 MG CP24 Take 1 capsule (60 mg total) by mouth daily. 07/13/24   Vaillancourt, Samantha, PA-C    Family History Family History  Problem Relation Age of Onset   Breast cancer Mother 49   Breast cancer Maternal Uncle 60   Breast cancer Maternal Aunt        mat great aunt   Breast cancer Maternal Aunt 94   Tracheal cancer Father    Cancer Sister    Brain cancer Sister    Cancer Brother     Social History Social  History[1]   Allergies   Cholecalciferol, Macrobid  [nitrofurantoin ], Butrans  [buprenorphine ], Codeine, Hydrocodone, Oxycodone , Peanut oil, Peanut-containing drug products, Penicillins, and Tramadol   Review of Systems Review of Systems  Constitutional:  Negative for fatigue.  Respiratory:  Negative for shortness of breath.   Cardiovascular:  Negative for chest pain and palpitations.  Gastrointestinal:  Negative for nausea and vomiting.  Musculoskeletal:  Positive for arthralgias. Negative for gait problem.  Skin:  Positive for color change and wound.  Neurological:  Positive for dizziness and headaches. Negative for syncope, facial asymmetry, speech difficulty, weakness and numbness.  Psychiatric/Behavioral:  Negative for confusion.      Physical Exam Triage Vital Signs ED Triage Vitals  Encounter Vitals Group     BP 10/27/24 1340 119/73     Girls Systolic BP Percentile --      Girls Diastolic BP Percentile --      Boys Systolic BP Percentile --      Boys Diastolic BP Percentile --      Pulse Rate 10/27/24 1340 97     Resp 10/27/24 1340 16     Temp 10/27/24 1340 97.7 F (36.5 C)     Temp src --      SpO2 10/27/24 1340 95 %     Weight --      Height --      Head Circumference --      Peak Flow --      Pain Score 10/27/24 1339 5     Pain Loc --      Pain Education --      Exclude from Growth Chart --    No data found.  Updated Vital Signs BP 119/73   Pulse 97   Temp 97.7 F (36.5 C)   Resp 16   SpO2 95%     Physical Exam Vitals and nursing note reviewed.  Constitutional:      General: She is not in acute distress.    Appearance: Normal appearance. She is not ill-appearing or toxic-appearing.  HENT:     Head:     Comments: Multiple contusions and abrasions of face, primarily on the right aspect of her face. Abrasion under right nostril. No bleeding noted inside of nose. Abrasions around right orbit. TTP under right eye/orbit  Right Ear: Tympanic membrane,  ear canal and external ear normal.     Left Ear: Tympanic membrane, ear canal and external ear normal.     Nose: Signs of injury and nasal tenderness present.     Mouth/Throat:     Mouth: Mucous membranes are moist.     Pharynx: Oropharynx is clear.  Eyes:     General: No scleral icterus.       Right eye: No discharge.        Left eye: No discharge.     Extraocular Movements: Extraocular movements intact.     Conjunctiva/sclera: Conjunctivae normal.     Pupils: Pupils are equal, round, and reactive to light.  Cardiovascular:     Rate and Rhythm: Normal rate and regular rhythm.     Heart sounds: Normal heart sounds.  Pulmonary:     Effort: Pulmonary effort is normal. No respiratory distress.     Breath sounds: Normal breath sounds.  Musculoskeletal:     Cervical back: Neck supple.  Skin:    General: Skin is dry.  Neurological:     General: No focal deficit present.     Mental Status: She is alert and oriented to person, place, and time. Mental status is at baseline.     Cranial Nerves: No cranial nerve deficit.     Motor: No weakness.     Coordination: Coordination normal.     Gait: Gait (uses a cane) normal.  Psychiatric:        Mood and Affect: Mood normal.      UC Treatments / Results  Labs (all labs ordered are listed, but only abnormal results are displayed) Labs Reviewed - No data to display  EKG   Radiology No results found.  Procedures Procedures (including critical care time)  Medications Ordered in UC Medications - No data to display  Initial Impression / Assessment and Plan / UC Course  I have reviewed the triage vital signs and the nursing notes.  Pertinent labs & imaging results that were available during my care of the patient were reviewed by me and considered in my medical decision making (see chart for details).   89 year old female presents with relative for evaluation of facial trauma.  Patient reports falling about 50 minutes prior to  arrival.  She is not sure how or why she fell and does not know exactly what she hit.  She has multiple abrasions and contusions of her face and also complains of right sided shoulder pain.  She has history of nasal fracture, right orbital fracture and right shoulder fracture last year after a fall.  She is reporting a mild headache and dizziness at this time.  No loss of consciousness.  Vitals are all stable and normal.  Walks with a cane.  Normal neurological exam.  She has multiple contusions and abrasions of face, primarily of the nose and right side of her face.  Tenderness of nose and under the right eye.  No trauma inside mouth.  No bleeding noted from nose although she does report a nosebleed.  Advised emergency department for CT scan of face and head to assess for possible fractures, intracranial bleeds.  I explained she should also report right sided shoulder pain at that time and it will likely be imaged as well.  They are agreeable.  Son will take her to [facility] ED [place].  Declines EMS transport.  Leaving in stable condition.  Given advanced age of patient and facial/head trauma, this could  represent injuries which may be threatening to bodily function and life.    Final Clinical Impressions(s) / UC Diagnoses   Final diagnoses:  Facial injury, initial encounter  Fall, initial encounter  Injury of head, initial encounter     Discharge Instructions      - I have advised you to go to the ER for CT scan of your face and head.  Please do not delay and go there immediately.  You have been advised to follow up immediately in the emergency department for concerning signs.symptoms. If you declined EMS transport, please have a family member take you directly to the ED at this time. Do not delay. Based on concerns about condition, if you do not follow up in th e ED, you may risk poor outcomes including worsening of condition, delayed treatment and potentially life threatening issues. If  you have declined to go to the ED at this time, you should call your PCP immediately to set up a follow up appointment.  Go to ED for red flag symptoms, including; fevers you cannot reduce with Tylenol /Motrin , severe headaches, vision changes, numbness/weakness in part of the body, lethargy, confusion, intractable vomiting, severe dehydration, chest pain, breathing difficulty, severe persistent abdominal or pelvic pain, signs of severe infection (increased redness, swelling of an area), feeling faint or passing out, dizziness, etc. You should especially go to the ED for sudden acute worsening of condition if you do not elect to go at this time.     ED Prescriptions   None    PDMP not reviewed this encounter.     [1]  Social History Tobacco Use   Smoking status: Never   Smokeless tobacco: Never  Vaping Use   Vaping status: Never Used  Substance Use Topics   Alcohol use: No   Drug use: Never     Arvis Jolan KATHEE DEVONNA 10/27/24 1731  "

## 2024-10-27 NOTE — Discharge Instructions (Signed)
-   I have advised you to go to the ER for CT scan of your face and head.  Please do not delay and go there immediately.  You have been advised to follow up immediately in the emergency department for concerning signs.symptoms. If you declined EMS transport, please have a family member take you directly to the ED at this time. Do not delay. Based on concerns about condition, if you do not follow up in th e ED, you may risk poor outcomes including worsening of condition, delayed treatment and potentially life threatening issues. If you have declined to go to the ED at this time, you should call your PCP immediately to set up a follow up appointment.  Go to ED for red flag symptoms, including; fevers you cannot reduce with Tylenol /Motrin , severe headaches, vision changes, numbness/weakness in part of the body, lethargy, confusion, intractable vomiting, severe dehydration, chest pain, breathing difficulty, severe persistent abdominal or pelvic pain, signs of severe infection (increased redness, swelling of an area), feeling faint or passing out, dizziness, etc. You should especially go to the ED for sudden acute worsening of condition if you do not elect to go at this time.

## 2024-10-27 NOTE — ED Triage Notes (Signed)
 Patient to Urgent Care with complaints of a mechanical fall as she stepped out of her front door this afternoon. Fell onto her right side.  Complains of right sided cheek/ facial pain. Multiple abrasions.  Denies LOC. No blood thinners.

## 2024-10-27 NOTE — Progress Notes (Signed)
 Safety precautions to be maintained throughout the outpatient stay will include: orient to surroundings, keep bed in low position, maintain call bell within reach at all times, provide assistance with transfer out of bed and ambulation.

## 2024-10-27 NOTE — Progress Notes (Signed)
 PROVIDER NOTE: Interpretation of information contained herein should be left to medically-trained personnel. Specific patient instructions are provided elsewhere under Patient Instructions section of medical record. This document was created in part using AI and STT-dictation technology, any transcriptional errors that may result from this process are unintentional.  Patient: Sarah [name]  Service: E/M   PCP: Zachary Idelia LABOR, MD  DOB: December 04, 1931  DOS: 10/27/2024  Provider: Wallie Sherry, MD  MRN: [id number]  Delivery: Face-to-face  Specialty: Interventional Pain Management  Type: Established Patient  Setting: Ambulatory outpatient facility  Specialty designation: 09  Referring Prov.: Zachary Idelia LABOR, MD  Location: Outpatient office facility       History of present illness (HPI) [name], a 89 y.o. 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 [name] primary complain today is Back Pain (Lumbar bilateral ) and Shoulder Pain (Bilateral )  Pertinent problems: [name] has Lumbar facet arthropathy; Lumbar radiculopathy; Lumbar disc herniation (L2/L3); Lumbar degenerative disc disease; Chronic pain syndrome; Cluneal neuropathy; Thyroid  nodule; and Bilateral occipital neuralgia on their pertinent problem list.  Pain Assessment: Severity of Chronic pain is reported as a 10-Worst pain ever/10. Location: Back Lower, Left, Right/into both hips.  has pain in the legs but feels this is r/t neuropathy. Onset: More than a month ago. Quality: Discomfort, Constant. Timing: Constant. Modifying factor(s): tylenol  helps a little bit. Vitals:  height is 5' 8 (1.727 m) and weight is 165 lb (74.8 kg). Her temporal temperature is 97.2 F (36.2 C) (abnormal). Her blood pressure is 126/81 and her pulse is 88. Her respiration is 16 and oxygen saturation is 95%.  BMI: Estimated body mass index is 25.09 kg/m as calculated from the following:   Height as of  this encounter: 5' 8 (1.727 m).   Weight as of this encounter: 165 lb (74.8 kg).  Last encounter: 10/06/2024. Last procedure: 10/12/2024.  Reason for encounter:  History of Present Illness   Sarah [name] is a 89 year old female with chronic lower back pain who presents for follow-up after an cluneal nerve block #2  She experienced approximately 90% pain relief in her lower back following the cluneal nerve block, which was more effective than previous spinal nerve blocks and ablations. There was significant improvement in mobility, particularly with rotation, during the week following the procedure.  Prior to the cluneal nerve nerve block, she underwent a lumbar facet medial branch nerve ablation in her lower back before Thanksgiving, which did not provide significant relief. The recent nerve block targeted nerves over her hip and back, which she found more beneficial.  She experiences neuropathy that has worsened, making it difficult for her to get out of bed in the morning. This condition contributes to her overall discomfort and impacts her daily activities.  She does not receive sedation during her nerve block procedures.        ROS  Constitutional: Denies any fever or chills Gastrointestinal: No reported hemesis, hematochezia, vomiting, or acute GI distress Musculoskeletal: Low back pain Neurological: No reported episodes of acute onset apraxia, aphasia, dysarthria, agnosia, amnesia, paralysis, loss of coordination, or loss of consciousness  Medication Review  Apoaequorin, Cholecalciferol, DULoxetine , Multiple Vitamins-Minerals, Probiotic (Lactobacillus), Trospium  Chloride, acetaminophen , alendronate, amoxicillin , baclofen, beta carotene w/minerals, bimatoprost, cyanocobalamin, dicyclomine, donepezil, fexofenadine, latanoprost, levothyroxine , lovastatin, melatonin, meloxicam , methylPREDNISolone , polyethylene glycol powder, pregabalin , and triamcinolone  History Review   Allergy: [name] is allergic to cholecalciferol, macrobid  [nitrofurantoin ], butrans  [buprenorphine ], codeine, hydrocodone, oxycodone , peanut  oil, peanut-containing drug products, penicillins, and tramadol. Drug: [name]  reports no history of drug use. Alcohol:  reports no history of alcohol use. Tobacco:  reports that she has never smoked. She has never used smokeless tobacco. Social: [name]  reports that she has never smoked. She has never used smokeless tobacco. She reports that she does not drink alcohol and does not use drugs. Medical:  has a past medical history of Allergic rhinitis, Benign essential HTN (09/21/2015), Bilateral occipital neuralgia, Cancer (HCC), Closed nondisplaced fracture of acromial end of right clavicle, Displacement of lumbar intervertebral disc without myelopathy, Dysphagia, Headache disorder, Localized primary osteoarthritis, Memory difficulty, Mixed hyperlipidemia, Moderate mitral insufficiency, Moderate tricuspid insufficiency, Neuropathy, Numbness in feet, Pre-diabetes, Seizures (HCC), Sensory ataxia, SI joint arthritis, Sleep apnea, SOBOE (shortness of breath on exertion), Spinal stenosis of lumbar region, 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 Surgical: [name]  has a past surgical history that includes Abdominal hysterectomy; Breast biopsy (Bilateral); Breast excisional biopsy (Right); Knee arthroscopy w/ lateral retinacular repair; Esophagogastroduodenoscopy (N/A, 03/11/2024); and Esophageal dilation (03/11/2024). Family: family history includes Brain cancer in her sister; Breast cancer in her maternal aunt; Breast cancer (age of onset: 47) in her maternal aunt; Breast cancer (age of onset: 71) in her mother; Breast cancer (age of onset: 46) in her maternal uncle; Cancer in her brother and sister; Tracheal cancer in her father.  Laboratory Chemistry Profile   Renal Lab  Results  Component Value Date   BUN 15 10/24/2023   CREATININE 0.73 10/24/2023   GFRAA 77 09/12/2020   GFRNONAA >60 10/24/2023    Hepatic Lab Results  Component Value Date   AST 19 11/26/2015   ALT 15 11/26/2015   ALBUMIN 4.7 11/26/2015   ALKPHOS 64 11/26/2015    Electrolytes Lab Results  Component Value Date   NA 140 10/24/2023   K 3.8 10/24/2023   CL 105 10/24/2023   CALCIUM 9.1 10/24/2023   MG 2.3 01/20/2023    Bone No results found for: VD25OH, VD125OH2TOT, CI6874NY7, CI7874NY7, 25OHVITD1, 25OHVITD2, 25OHVITD3, TESTOFREE, TESTOSTERONE  Inflammation (CRP: Acute Phase) (ESR: Chronic Phase) No results found for: CRP, ESRSEDRATE, LATICACIDVEN       Note: Above Lab results reviewed.  Recent Imaging Review  MR ABDOMEN MRCP W WO CONTRAST EXAM: MRI Abdomen With and Without Intravenous Contrast 10/19/2024 08:42:19 AM  TECHNIQUE: Multiplanar multisequence MRI of the abdomen was performed with and without the administration of intravenous contrast. 7 mL of gadobutrol  (GADAVIST ) 1 MMOL/ML injection was administered intravenously.  COMPARISON: Ultrasound abdomen complete 10/11/2024.  CLINICAL HISTORY: Occasional abdominal pain. Recent ultrasound showing dilated common bile duct at 9 mm.  FINDINGS:  LIVER: Diffuse hepatic steatosis. No suspicious liver lesions.  GALLBLADDER AND BILIARY SYSTEM: The gallbladder appears normal. No gallstones, wall thickening, or pericholecystic fluid. Fusiform dilatation of the common bile duct measures up to 1 cm proximally. Mild central biliary prominence. Focal narrowing of the distal common bile duct is identified, image 60/13, which may reflect a stricture. No signs of choledocholithiasis.  SPLEEN: The spleen is within normal limits in size and appearance.  PANCREAS: No pancreatic main duct dilatation or inflammation. There are scattered small cysts noted throughout the pancreas. The largest is in the  uncinate process measuring 8 mm.  ADRENAL GLANDS: Normal size and morphology bilaterally. No nodule, thickening, or hemorrhage. No periadrenal stranding.  KIDNEYS: Normal size and morphology bilaterally. No nodule, thickening, or  hemorrhage. No periadrenal stranding. Several small uniformly T2 hyperintense cysts are present in the left kidney, measuring up to 5 mm, compatible with a benign Bosniak class 1 cyst. No follow-up imaging 4 bosniak class 1 or 2 kidney cysts unless otherwise clinically indicated .  LYMPH NODES: No enlarged lymph nodes within the abdomen.  VASCULATURE: Unremarkable.  PERITONEUM: There is no ascites or discrete focal fluid collections.  BOWEL: The stomach appears normal. Visualized bowel loops are nondilated. Moderate retained stool noted within the colon.  ABDOMINAL WALL: No acute abnormality.  BONES: No acute abnormality.  PLEURAL SPACES: Trace bilateral pleural effusions.  IMPRESSION: 1. Fusiform dilatation of the common bile duct up to 1 cm with mild central biliary prominence. Focal distal common bile duct narrowing which may reflect a stricture. No signs of obstructing calculus or mass. 2. Hepatic steatosis. 3. Small scattered pancreatic cysts identified, measuring up to 8 mm. According to consensus criteria, follow-up imaging with pancreas protocol MRI in 24 months is advised. Note: The decision to pursue imaging follow-up and/or EUS/FNA should be anchored to a patient's overall health and preferences; such work-up is only advised if the patient is a surgical candidate.  Electronically signed by: Waddell Calk MD 10/19/2024 09:10 AM EST RP Workstation: [id number] Note: Reviewed        Physical Exam  Vitals: BP 126/81 (BP Location: Left Arm, Patient Position: Sitting, Cuff Size: Normal)   Pulse 88   Temp (!) 97.2 F (36.2 C) (Temporal)   Resp 16   Ht 5' 8 (1.727 m)   Wt 165 lb (74.8 kg)   SpO2 95%   BMI 25.09 kg/m  BMI:  Estimated body mass index is 25.09 kg/m as calculated from the following:   Height as of this encounter: 5' 8 (1.727 m).   Weight as of this encounter: 165 lb (74.8 kg). Ideal: Ideal body weight: 63.9 kg (140 lb 14 oz) Adjusted ideal body weight: 68.3 kg (150 lb 8.4 oz) General appearance: Well nourished, well developed, and well hydrated. In no apparent acute distress Mental status: Alert, oriented x 3 (person, place, & time)       Respiratory: No evidence of acute respiratory distress Eyes: PERLA  Positive Tinels sign over iliac crest, tender to palpation Assessment   Diagnosis  1. Cluneal neuropathy   2. Chronic pain syndrome      Updated Problems: Problem  Cluneal Neuropathy    Plan of Care  Problem-specific:  Assessment and Plan    Cluneal neuropathy   Her pain and mobility have significantly improved following a recent bilateral nerve block #2, achieving approximately 90% pain relief. Previous nerve ablations in the lower back were less effective, while the nerve block targeting the posterior hip area was more beneficial. A repeat cluneal nerve block is scheduled for next Wednesday. Consider peripheral cluneal nerve stimulator placement after MRI, pending insurance approval        [name] has a current medication list which includes the following long-term medication(s): donepezil, duloxetine , fexofenadine, levothyroxine , and levothyroxine .  Pharmacotherapy (Medications Ordered): No orders of the defined types were placed in this encounter.  Orders:  Orders Placed This Encounter  Procedures   CLUNEAL NERVE BLOCK    Standing Status:   Future    Expected Date:   11/02/2024    Expiration Date:   10/27/2025    Scheduling Instructions:     Side: Bilateral     Sedation: Cluneal nerve block    Where will this procedure  be performed?:   [facility]    Return in about 6 days (around 11/02/2024) for B/L Cluneal NB, in clinic [facility].    Recent  Visits Date Type Provider Dept  10/12/24 Procedure visit Marcelino Nurse, MD [facility]  10/06/24 Office Visit Marcelino Nurse, MD [facility]  08/10/24 Procedure visit Marcelino Nurse, MD [facility]  Showing recent visits within past 90 days and meeting all other requirements Today's Visits Date Type Provider Dept  10/27/24 Office Visit Marcelino Nurse, MD [facility]  Showing today's visits and meeting all other requirements Future Appointments Date Type Provider Dept  11/02/24 Appointment Marcelino Nurse, MD [facility]  Showing future appointments within next 90 days and meeting all other requirements  I discussed the assessment and treatment plan with the patient. The patient was provided an opportunity to ask questions and all were answered. The patient agreed with the plan and demonstrated an understanding of the instructions.  Patient advised to call back or seek an in-person evaluation if the symptoms or condition worsens.  I personally spent a total of 30 minutes in the care of the patient today including preparing to see the patient, getting/reviewing separately obtained history, performing a medically appropriate exam/evaluation, counseling and educating, placing orders, and documenting clinical information in the EHR.   Note by: Nurse Marcelino, MD (TTS and AI technology used. I apologize for any typographical errors that were not detected and corrected.) Date: 10/27/2024; Time: 11:12 AM

## 2024-10-27 NOTE — ED Notes (Signed)
 Patient is being discharged from the Urgent Care and sent to the Emergency Department via personal opperated vehicle . Per Arvis Huxley B, PA, patient is in need of higher level of care due to Fall with facial injury. Patient is aware and verbalizes understanding of plan of care.  Vitals:   10/27/24 1340  BP: 119/73  Pulse: 97  Resp: 16  Temp: 97.7 F (36.5 C)  SpO2: 95%

## 2024-10-28 ENCOUNTER — Other Ambulatory Visit: Payer: Self-pay | Admitting: Gastroenterology

## 2024-10-28 DIAGNOSIS — R1314 Dysphagia, pharyngoesophageal phase: Secondary | ICD-10-CM

## 2024-10-31 ENCOUNTER — Ambulatory Visit

## 2024-11-02 ENCOUNTER — Ambulatory Visit: Admitting: Student in an Organized Health Care Education/Training Program

## 2024-11-08 ENCOUNTER — Ambulatory Visit
